# Patient Record
Sex: Female | Born: 1935 | Race: White | Hispanic: No | Smoking: Former smoker
Health system: Southern US, Community
[De-identification: ages and names within clinical notes are randomized; demographics above are authoritative.]

## PROBLEM LIST (undated history)

## (undated) DIAGNOSIS — S52134A Nondisplaced fracture of neck of right radius, initial encounter for closed fracture: Secondary | ICD-10-CM

## (undated) DIAGNOSIS — I10 Essential (primary) hypertension: Secondary | ICD-10-CM

## (undated) DIAGNOSIS — K219 Gastro-esophageal reflux disease without esophagitis: Secondary | ICD-10-CM

## (undated) DIAGNOSIS — G8929 Other chronic pain: Secondary | ICD-10-CM

## (undated) DIAGNOSIS — M19042 Primary osteoarthritis, left hand: Principal | ICD-10-CM

## (undated) DIAGNOSIS — Z79899 Other long term (current) drug therapy: Secondary | ICD-10-CM

## (undated) DIAGNOSIS — M1990 Unspecified osteoarthritis, unspecified site: Secondary | ICD-10-CM

## (undated) DIAGNOSIS — M19041 Primary osteoarthritis, right hand: Secondary | ICD-10-CM

## (undated) DIAGNOSIS — H3530 Unspecified macular degeneration: Secondary | ICD-10-CM

## (undated) DIAGNOSIS — Z87891 Personal history of nicotine dependence: Secondary | ICD-10-CM

## (undated) DIAGNOSIS — M810 Age-related osteoporosis without current pathological fracture: Secondary | ICD-10-CM

## (undated) DIAGNOSIS — E785 Hyperlipidemia, unspecified: Secondary | ICD-10-CM

## (undated) DIAGNOSIS — S82142A Displaced bicondylar fracture of left tibia, initial encounter for closed fracture: Secondary | ICD-10-CM

## (undated) DIAGNOSIS — F419 Anxiety disorder, unspecified: Secondary | ICD-10-CM

## (undated) DIAGNOSIS — M5134 Other intervertebral disc degeneration, thoracic region: Secondary | ICD-10-CM

## (undated) HISTORY — DX: Anxiety disorder, unspecified: F41.9

## (undated) HISTORY — PX: OTHER SURGICAL HISTORY: SHX169

## (undated) HISTORY — DX: Unspecified macular degeneration: H35.30

## (undated) HISTORY — DX: Other long term (current) drug therapy: Z79.899

## (undated) HISTORY — DX: Age-related osteoporosis without current pathological fracture: M81.0

## (undated) HISTORY — DX: Other chronic pain: G89.29

## (undated) HISTORY — DX: Hyperlipidemia, unspecified: E78.5

## (undated) HISTORY — DX: Primary osteoarthritis, right hand: M19.041

## (undated) HISTORY — DX: Personal history of nicotine dependence: Z87.891

## (undated) HISTORY — PX: ABDOMINAL HYSTERECTOMY: SHX81

## (undated) HISTORY — DX: Primary osteoarthritis, left hand: M19.042

## (undated) HISTORY — DX: Essential (primary) hypertension: I10

## (undated) HISTORY — DX: Unspecified osteoarthritis, unspecified site: M19.90

## (undated) HISTORY — DX: Gastro-esophageal reflux disease without esophagitis: K21.9

## (undated) HISTORY — PX: CATARACT EXTRACTION: SUR2

## (undated) HISTORY — DX: Other intervertebral disc degeneration, thoracic region: M51.34

---
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
## 2007-03-20 ENCOUNTER — Ambulatory Visit: Payer: Self-pay

## 2007-06-27 ENCOUNTER — Ambulatory Visit: Payer: Self-pay | Admitting: Family Medicine

## 2008-05-11 ENCOUNTER — Ambulatory Visit: Payer: Self-pay | Admitting: Family Medicine

## 2008-06-17 ENCOUNTER — Ambulatory Visit: Payer: Self-pay | Admitting: Family Medicine

## 2008-07-06 ENCOUNTER — Ambulatory Visit: Payer: Self-pay | Admitting: Family Medicine

## 2008-07-14 ENCOUNTER — Other Ambulatory Visit: Payer: Self-pay

## 2008-07-14 ENCOUNTER — Ambulatory Visit: Payer: Self-pay | Admitting: Ophthalmology

## 2008-07-28 ENCOUNTER — Ambulatory Visit: Payer: Self-pay | Admitting: Ophthalmology

## 2010-01-25 ENCOUNTER — Ambulatory Visit: Payer: Self-pay | Admitting: Ophthalmology

## 2010-02-08 ENCOUNTER — Ambulatory Visit: Payer: Self-pay | Admitting: Ophthalmology

## 2014-04-21 ENCOUNTER — Ambulatory Visit: Payer: Self-pay | Admitting: Family Medicine

## 2014-11-25 DIAGNOSIS — G894 Chronic pain syndrome: Secondary | ICD-10-CM | POA: Diagnosis not present

## 2014-11-25 DIAGNOSIS — M549 Dorsalgia, unspecified: Secondary | ICD-10-CM | POA: Diagnosis not present

## 2014-11-25 DIAGNOSIS — Z5181 Encounter for therapeutic drug level monitoring: Secondary | ICD-10-CM | POA: Diagnosis not present

## 2014-11-25 DIAGNOSIS — I10 Essential (primary) hypertension: Secondary | ICD-10-CM | POA: Diagnosis not present

## 2014-11-25 DIAGNOSIS — G8929 Other chronic pain: Secondary | ICD-10-CM | POA: Diagnosis not present

## 2014-11-25 DIAGNOSIS — J449 Chronic obstructive pulmonary disease, unspecified: Secondary | ICD-10-CM | POA: Diagnosis not present

## 2015-01-05 DIAGNOSIS — J449 Chronic obstructive pulmonary disease, unspecified: Secondary | ICD-10-CM | POA: Diagnosis not present

## 2015-01-05 DIAGNOSIS — I10 Essential (primary) hypertension: Secondary | ICD-10-CM | POA: Diagnosis not present

## 2015-01-05 DIAGNOSIS — F411 Generalized anxiety disorder: Secondary | ICD-10-CM | POA: Diagnosis not present

## 2015-01-05 DIAGNOSIS — G8929 Other chronic pain: Secondary | ICD-10-CM | POA: Diagnosis not present

## 2015-02-17 DIAGNOSIS — H3531 Nonexudative age-related macular degeneration: Secondary | ICD-10-CM | POA: Diagnosis not present

## 2015-03-24 DIAGNOSIS — M791 Myalgia: Secondary | ICD-10-CM | POA: Diagnosis not present

## 2015-03-24 DIAGNOSIS — G8929 Other chronic pain: Secondary | ICD-10-CM | POA: Diagnosis not present

## 2015-03-24 DIAGNOSIS — M1990 Unspecified osteoarthritis, unspecified site: Secondary | ICD-10-CM | POA: Diagnosis not present

## 2015-03-24 DIAGNOSIS — Z79899 Other long term (current) drug therapy: Secondary | ICD-10-CM | POA: Diagnosis not present

## 2015-04-26 ENCOUNTER — Other Ambulatory Visit: Payer: Self-pay | Admitting: Family Medicine

## 2015-05-24 ENCOUNTER — Telehealth: Payer: Self-pay | Admitting: Family Medicine

## 2015-05-24 NOTE — Telephone Encounter (Signed)
Pt was scheduled today for an appt for her chronic pain meds, we had to reschedule. She will be out on Wed 05/26/15 and she has another appt on 06/01/15 for her appt.

## 2015-05-25 ENCOUNTER — Encounter: Payer: Self-pay | Admitting: Family Medicine

## 2015-05-25 ENCOUNTER — Ambulatory Visit (INDEPENDENT_AMBULATORY_CARE_PROVIDER_SITE_OTHER): Payer: Medicare Other | Admitting: Family Medicine

## 2015-05-25 ENCOUNTER — Telehealth: Payer: Self-pay | Admitting: Emergency Medicine

## 2015-05-25 VITALS — BP 118/68 | HR 87 | Temp 97.6°F | Resp 16 | Ht 63.0 in | Wt 116.3 lb

## 2015-05-25 DIAGNOSIS — E46 Unspecified protein-calorie malnutrition: Secondary | ICD-10-CM

## 2015-05-25 DIAGNOSIS — L570 Actinic keratosis: Secondary | ICD-10-CM | POA: Diagnosis not present

## 2015-05-25 DIAGNOSIS — G8929 Other chronic pain: Secondary | ICD-10-CM | POA: Insufficient documentation

## 2015-05-25 DIAGNOSIS — F419 Anxiety disorder, unspecified: Secondary | ICD-10-CM | POA: Diagnosis not present

## 2015-05-25 MED ORDER — HYDROCODONE-ACETAMINOPHEN 10-325 MG PO TABS: 1.0000 | ORAL_TABLET | Freq: Four times a day (QID) | ORAL | Status: DC | PRN

## 2015-05-25 MED ORDER — ALPRAZOLAM 0.5 MG PO TABS: 0.5000 mg | ORAL_TABLET | Freq: Every evening | ORAL | Status: DC | PRN

## 2015-05-25 NOTE — Progress Notes (Signed)
Name: Samantha Dawson   MRN: [id number]    DOB: Feb 23, 1929   Date:05/25/2015       Progress Note  Subjective  Chief Complaint  Chief Complaint  Patient presents with  . Pain  . Anxiety  . Osteoarthritis    Anxiety Presents for follow-up visit. The problem has been waxing and waning. Symptoms include excessive worry, irritability, nervous/anxious behavior, palpitations and restlessness. Patient reports no chest pain, dizziness, insomnia, nausea or shortness of breath. Symptoms occur most days. The severity of symptoms is moderate. The symptoms are aggravated by specific phobias, social activities and family issues. The quality of sleep is fair.   Past treatments include benzodiazephines. The treatment provided moderate relief. Compliance with prior treatments has been good.    Chronic pain.  Patient has a long-standing history of chronic osteoporotic pain particularly in the hands and knees. Without her current pain medication she is unable to do her ADLs and to rest comfortably at night.   Skin lesions  Patient complains of irregular lesions on her lower extremities also upper extremities. She has a long-standing history of sun exposure without sunscreen detection. She has not been seen by dermatologists of recent. Past Medical History  Diagnosis Date  . Osteoarthritis   . Anxiety   . Encounter for chronic pain management     History  Substance Use Topics  . Smoking status: Former Research scientist (life sciences)  . Smokeless tobacco: Not on file  . Alcohol Use: No     Current outpatient prescriptions:  .  albuterol (PROVENTIL HFA;VENTOLIN HFA) 108 (90 BASE) MCG/ACT inhaler, Inhale into the lungs every 6 (six) hours as needed for wheezing or shortness of breath., Disp: , Rfl:  .  ALPRAZolam (XANAX) 0.5 MG tablet, Take 0.5 mg by mouth at bedtime as needed for anxiety., Disp: , Rfl:  .  aspirin 81 MG tablet, Take 81 mg by mouth daily., Disp: , Rfl:  .  clopidogrel (PLAVIX) 75 MG tablet, Take 75 mg  by mouth daily., Disp: , Rfl:  .  co-enzyme Q-10 30 MG capsule, Take 30 mg by mouth 3 (three) times daily., Disp: , Rfl:  .  HYDROcodone-acetaminophen (NORCO) 10-325 MG per tablet, Take 1 tablet by mouth every 6 (six) hours as needed., Disp: , Rfl:  .  metoprolol succinate (TOPROL-XL) 50 MG 24 hr tablet, Take 50 mg by mouth daily. Take with or immediately following a meal., Disp: , Rfl:  .  pantoprazole (PROTONIX) 40 MG tablet, Take 40 mg by mouth daily., Disp: , Rfl:  .  rosuvastatin (CRESTOR) 5 MG tablet, Take 5 mg by mouth daily., Disp: , Rfl:  .  tiotropium (SPIRIVA) 18 MCG inhalation capsule, Place 18 mcg into inhaler and inhale daily., Disp: , Rfl:  .  valsartan (DIOVAN) 80 MG tablet, TAKE ONE (1) TABLET EACH DAY, Disp: 30 tablet, Rfl: 7  No Known Allergies  Review of Systems  Constitutional: Positive for irritability. Negative for fever, chills and weight loss.  HENT: Negative for congestion, hearing loss, sore throat and tinnitus.   Eyes: Negative for blurred vision, double vision and redness.  Respiratory: Negative for cough, hemoptysis and shortness of breath.   Cardiovascular: Positive for palpitations. Negative for chest pain, orthopnea, claudication and leg swelling.  Gastrointestinal: Negative for heartburn, nausea, vomiting, diarrhea, constipation and blood in stool.  Genitourinary: Negative for dysuria, urgency, frequency and hematuria.  Musculoskeletal: Positive for joint pain. Negative for myalgias, back pain, falls and neck pain.  Skin: Positive for rash. Negative for  itching.       Solar damage lesions of the extremities  Neurological: Negative for dizziness, tingling, tremors, focal weakness, seizures, loss of consciousness, weakness and headaches.  Endo/Heme/Allergies: Does not bruise/bleed easily.  Psychiatric/Behavioral: Negative for depression and substance abuse. The patient is nervous/anxious. The patient does not have insomnia.      Objective  Filed Vitals:    05/25/15 1520  BP: 118/68  Pulse: 87  Temp: 97.6 F (36.4 C)  Resp: 16  Height: 5\' 3"  (1.6 m)  Weight: 116 lb 5 oz (52.759 kg)  SpO2: 93%     Physical Exam  Constitutional: She is oriented to person, place, and time and well-developed, well-nourished, and in no distress.  HENT:  Head: Normocephalic.  Eyes: EOM are normal. Pupils are equal, round, and reactive to light.  Neck: Normal range of motion. No thyromegaly present.  Cardiovascular: Normal rate, regular rhythm and normal heart sounds.   No murmur heard. Pulmonary/Chest: Effort normal and breath sounds normal.  Abdominal: Soft. Bowel sounds are normal.  Musculoskeletal: Normal range of motion. She exhibits tenderness. She exhibits no edema.  Neurological: She is alert and oriented to person, place, and time. No cranial nerve deficit. Gait normal.  Skin: Skin is warm and dry. Rash noted.  Multiple changes of solar keratosis. There is also a horny area of the epithelium in the right lower extremity which is very firm and tender  Psychiatric: Memory and affect normal.  Anxious      Assessment & Plan  1. Chronic pain Stable - HYDROcodone-acetaminophen (NORCO) 10-325 MG per tablet; Take 1 tablet by mouth every 6 (six) hours as needed.  Dispense: 120 tablet; Refill: 0  2. Acute anxiety Stable - ALPRAZolam (XANAX) 0.5 MG tablet; Take 1 tablet (0.5 mg total) by mouth at bedtime as needed for anxiety.  Dispense: 30 tablet; Refill: 3  3. Actinic keratosis Needs referral - Ambulatory referral to Dermatology  4. Protein calorie malnutrition Nutritional supplementation as needed and has not is given

## 2015-05-25 NOTE — Telephone Encounter (Signed)
Spoke to patient. Made appointment for today to be seen for pain med refills

## 2015-05-25 NOTE — Patient Instructions (Signed)
Protein Supplement Powders Protein, along with carbohydrates, fats, fibers, and fluids, is one of the major components of the human diet. Protein is the source of amino acids, which the body uses as building blocks for much of the human body. There are two types of amino acids: essential and non-essential. The essential amino acids cannot be produced by the body and must be take in through food and drink. Most people associate proteins with muscles, but they are also important for enzymes and hormones that play regulatory roles in the body. As the body breaks down proteins, they need to be replaced.  WHY ATHLETES USE IT   To improve muscle growth and repair of exercising muscle.  To make red blood cells, enzymes, and antibodies.  To maintain hair, skin, and fingernail health.  To maintain regular menstrual cycles in athletic females. ADVERSE EFFECTS   Gastrointestinal discomfort.  Constipation.  Tissue dehydration.  Increased stress on kidneys to remove the proteins. PHARMACOLOGY Adults need on average 0.8 g/kg of lean body mass (approximately 75 to 90% of total body weight) per day to maintain proper metabolic functions. People who place greater stress on their muscles, such as athletes, require greater amounts of protein in their diets (1.4 to 1.8 g/kg of lean body mass per day). There is limited evidence to show that protein or amino acid supplementation increases performance ability. No study shows improvement with protein consumption of 2 g/ kg of lean body mass per day. PREVENTION  Due to the lack of regulation, many of the products labeled for sale may contain impurities or dosages that differ from the labeling. If one chooses to use take protein, it is important to drink a lot of fluid. Taking protein may create an osmotic force that absorbs water. Dosages that are greater than recommended will put individuals at a higher risk of kidney damage. Therefore, calculate the you daily  consumption of protein with both protein in your diet and in your supplements. Protein rich foods include lean meat, milk, eggs, yogurt and cheeses, as well as beans, rice, tofu, legumes, and grains. Before beginning a protein supplement regimen speak with a sports nutritionist. Document Released: 10/23/2005 Document Revised: 01/15/2012 Document Reviewed: 02/04/2009 Exit[employer] Patient Information 2015 [place], [place]. This information is not intended to replace advice given to you by your health care provider. Make sure you discuss any questions you have with your health care provider.

## 2015-05-26 DIAGNOSIS — E46 Unspecified protein-calorie malnutrition: Secondary | ICD-10-CM | POA: Insufficient documentation

## 2015-06-01 ENCOUNTER — Ambulatory Visit: Payer: Self-pay | Admitting: Family Medicine

## 2015-07-22 ENCOUNTER — Encounter: Payer: Self-pay | Admitting: Family Medicine

## 2015-07-22 ENCOUNTER — Ambulatory Visit (INDEPENDENT_AMBULATORY_CARE_PROVIDER_SITE_OTHER): Payer: Medicare Other | Admitting: Family Medicine

## 2015-07-22 VITALS — BP 118/80 | HR 83 | Temp 97.9°F | Resp 16 | Ht 63.0 in | Wt 116.3 lb

## 2015-07-22 DIAGNOSIS — H5789 Other specified disorders of eye and adnexa: Secondary | ICD-10-CM

## 2015-07-22 DIAGNOSIS — H5712 Ocular pain, left eye: Secondary | ICD-10-CM

## 2015-07-22 DIAGNOSIS — G8929 Other chronic pain: Secondary | ICD-10-CM | POA: Diagnosis not present

## 2015-07-22 DIAGNOSIS — H5319 Other subjective visual disturbances: Secondary | ICD-10-CM

## 2015-07-22 DIAGNOSIS — H53142 Visual discomfort, left eye: Secondary | ICD-10-CM

## 2015-07-22 DIAGNOSIS — H578 Other specified disorders of eye and adnexa: Secondary | ICD-10-CM

## 2015-07-22 DIAGNOSIS — Z23 Encounter for immunization: Secondary | ICD-10-CM

## 2015-07-22 DIAGNOSIS — H3530 Unspecified macular degeneration: Secondary | ICD-10-CM | POA: Diagnosis not present

## 2015-07-22 DIAGNOSIS — H1132 Conjunctival hemorrhage, left eye: Secondary | ICD-10-CM | POA: Diagnosis not present

## 2015-07-22 MED ORDER — HYDROCODONE-ACETAMINOPHEN 10-325 MG PO TABS: 1.0000 | ORAL_TABLET | Freq: Four times a day (QID) | ORAL | Status: DC | PRN

## 2015-07-22 NOTE — Progress Notes (Signed)
Name: Samantha Dawson   MRN: [id number]    DOB: [birth year]   Date:07/22/2015       Progress Note  Subjective  Chief Complaint  Chief Complaint  Patient presents with  . Arthritis    2 month recheck   . Pain  . Eye Problem    redness in right eye for 4 days    HPI  Chronic pain  Patient presents for follow-up of chronic pain syndrome. The pain score at worse is 10 3 10   and at best is 3/10 with medication rest and home remedies.  There is no evidence of any extra dosage or issues of usage outside of the prescribed regimen.  Pain is exacerbated by changes in weather and by strenuous activities. There have been no recent activities to exacerbate the chronic pain. Concomitant medications include hydrocodone 1 every 6 hours .  Drug screen and medication contract have been renewed within the last 1 year.    Eye Pain and redness  Patient presents with a four-day history of left eye pain. There is also some photophobia. She has a history of macular degeneration as well. There's been some change in her vision. There is no history of any trauma to the eye. She does have a history of macular  degeneration at the same  Past Medical History  Diagnosis Date  . Osteoarthritis   . Anxiety   . Encounter for chronic pain management   . Hyperlipidemia   . Hypertension     Social History  Substance Use Topics  . Smoking status: Former Research scientist (life sciences)  . Smokeless tobacco: Not on file  . Alcohol Use: No     Current outpatient prescriptions:  .  albuterol (PROVENTIL HFA;VENTOLIN HFA) 108 (90 BASE) MCG/ACT inhaler, Inhale into the lungs every 6 (six) hours as needed for wheezing or shortness of breath., Disp: , Rfl:  .  ALPRAZolam (XANAX) 0.5 MG tablet, Take 1 tablet (0.5 mg total) by mouth at bedtime as needed for anxiety., Disp: 30 tablet, Rfl: 3 .  aspirin 81 MG tablet, Take 81 mg by mouth daily., Disp: , Rfl:  .  clopidogrel (PLAVIX) 75 MG tablet, Take 75 mg by mouth daily., Disp: , Rfl:  .   co-enzyme Q-10 30 MG capsule, Take 30 mg by mouth 3 (three) times daily., Disp: , Rfl:  .  HYDROcodone-acetaminophen (NORCO) 10-325 MG per tablet, Take 1 tablet by mouth every 6 (six) hours as needed., Disp: 120 tablet, Rfl: 0 .  metoprolol succinate (TOPROL-XL) 50 MG 24 hr tablet, Take 50 mg by mouth daily. Take with or immediately following a meal., Disp: , Rfl:  .  pantoprazole (PROTONIX) 40 MG tablet, Take 40 mg by mouth daily., Disp: , Rfl:  .  ranitidine (ZANTAC) 150 MG tablet, , Disp: , Rfl:  .  rosuvastatin (CRESTOR) 5 MG tablet, Take 5 mg by mouth daily., Disp: , Rfl:  .  tiotropium (SPIRIVA) 18 MCG inhalation capsule, Place 18 mcg into inhaler and inhale daily., Disp: , Rfl:  .  valsartan (DIOVAN) 80 MG tablet, TAKE ONE (1) TABLET EACH DAY, Disp: 30 tablet, Rfl: 7  No Known Allergies  Review of Systems  Constitutional: Negative.  Negative for fever, chills, weight loss and malaise/fatigue.  HENT: Negative for ear pain, hearing loss and tinnitus.   Eyes: Positive for blurred vision, photophobia, pain, discharge and redness. Negative for double vision.  Respiratory: Negative.   Cardiovascular: Negative.   Musculoskeletal: Positive for back pain, joint pain and  neck pain. Negative for falls.  Skin: Negative.   Neurological: Negative for headaches.     Objective  Filed Vitals:   07/22/15 1048  BP: 118/80  Pulse: 83  Temp: 97.9 F (36.6 C)  TempSrc: Oral  Resp: 16  Height: 5\' 3"  (1.6 m)  Weight: 116 lb 4.8 oz (52.753 kg)  SpO2: 94%     Physical Exam  Constitutional: She is oriented to person, place, and time and well-developed, well-nourished, and in no distress.  HENT:  Head: Normocephalic.  Eyes: EOM and lids are normal. Pupils are equal, round, and reactive to light. Right conjunctiva is injected. Right conjunctiva has a hemorrhage.  Fundoscopic exam:      The right eye shows no arteriolar narrowing, no AV nicking, no exudate, no hemorrhage and no papilledema. The  right eye shows red reflex.       The left eye shows no arteriolar narrowing, no AV nicking, no exudate, no hemorrhage and no papilledema. The left eye shows red reflex.  Neck: Normal range of motion. No thyromegaly present.  Cardiovascular: Normal rate, regular rhythm and normal heart sounds.   No murmur heard. Pulmonary/Chest: Effort normal and breath sounds normal.  Abdominal: Soft. Bowel sounds are normal.  Musculoskeletal: She exhibits tenderness (diffuse joint tenderness particularly of the MCP DIP PIP joints and the knees. There is no synovitis or warmth or erythema. Hypertrophic changes noted). She exhibits no edema.  Neurological: She is alert and oriented to person, place, and time. No cranial nerve deficit. Gait normal.  Skin: Skin is warm and dry. No rash noted.  Psychiatric: Memory and affect normal.      Assessment & Plan   1. Chronic pain Basically stable except for exacerbations during weather changes  - HYDROcodone-acetaminophen (NORCO) 10-325 MG per tablet; Take 1 tablet by mouth every 6 (six) hours as needed.  Dispense: 120 tablet; Refill: 0 - HYDROcodone-acetaminophen (NORCO) 10-325 MG per tablet; Take 1 tablet by mouth every 6 (six) hours as needed.  Dispense: 120 tablet; Refill: 0 - HYDROcodone-acetaminophen (NORCO) 10-325 MG per tablet; Take 1 tablet by mouth every 6 (six) hours as needed.  Dispense: 120 tablet; Refill: 0 - HYDROcodone-acetaminophen (NORCO) 10-325 MG per tablet; Take 1 tablet by mouth every 6 (six) hours as needed.  Dispense: 120 tablet; Refill: 0  2. Eye redness  referred to ophthalmologist ASAP  3. Eye pain, left Referred to ophthalmologist ASAP  4. Photophobia of left eye \\Referred  to ophthalmologist ASAP  5. Macular degeneration Follow-up regular doctor

## 2015-08-03 NOTE — Patient Instructions (Signed)
Referral to ophthalmologist acutely

## 2015-08-11 DIAGNOSIS — C44729 Squamous cell carcinoma of skin of left lower limb, including hip: Secondary | ICD-10-CM | POA: Diagnosis not present

## 2015-08-11 DIAGNOSIS — L570 Actinic keratosis: Secondary | ICD-10-CM | POA: Diagnosis not present

## 2015-08-11 DIAGNOSIS — D485 Neoplasm of uncertain behavior of skin: Secondary | ICD-10-CM | POA: Diagnosis not present

## 2015-08-11 DIAGNOSIS — L578 Other skin changes due to chronic exposure to nonionizing radiation: Secondary | ICD-10-CM | POA: Diagnosis not present

## 2015-08-17 ENCOUNTER — Other Ambulatory Visit: Payer: Self-pay | Admitting: Family Medicine

## 2015-08-24 ENCOUNTER — Ambulatory Visit: Payer: Self-pay | Admitting: Family Medicine

## 2015-08-26 ENCOUNTER — Other Ambulatory Visit: Payer: Self-pay | Admitting: Family Medicine

## 2015-09-15 DIAGNOSIS — C44729 Squamous cell carcinoma of skin of left lower limb, including hip: Secondary | ICD-10-CM | POA: Diagnosis not present

## 2015-10-15 ENCOUNTER — Other Ambulatory Visit: Payer: Self-pay | Admitting: Family Medicine

## 2015-10-20 DIAGNOSIS — C44729 Squamous cell carcinoma of skin of left lower limb, including hip: Secondary | ICD-10-CM | POA: Diagnosis not present

## 2015-10-25 ENCOUNTER — Ambulatory Visit (INDEPENDENT_AMBULATORY_CARE_PROVIDER_SITE_OTHER): Payer: Medicare Other | Admitting: Family Medicine

## 2015-10-25 ENCOUNTER — Encounter: Payer: Self-pay | Admitting: Family Medicine

## 2015-10-25 VITALS — BP 122/70 | HR 82 | Temp 98.9°F | Resp 18 | Ht 63.0 in | Wt 115.2 lb

## 2015-10-25 DIAGNOSIS — E785 Hyperlipidemia, unspecified: Secondary | ICD-10-CM | POA: Insufficient documentation

## 2015-10-25 DIAGNOSIS — G8929 Other chronic pain: Secondary | ICD-10-CM | POA: Diagnosis not present

## 2015-10-25 DIAGNOSIS — I10 Essential (primary) hypertension: Secondary | ICD-10-CM | POA: Diagnosis not present

## 2015-10-25 DIAGNOSIS — F419 Anxiety disorder, unspecified: Secondary | ICD-10-CM

## 2015-10-25 DIAGNOSIS — J431 Panlobular emphysema: Secondary | ICD-10-CM | POA: Diagnosis not present

## 2015-10-25 MED ORDER — ALPRAZOLAM 0.5 MG PO TABS: 0.5000 mg | ORAL_TABLET | Freq: Every evening | ORAL | Status: DC | PRN

## 2015-10-25 MED ORDER — HYDROCODONE-ACETAMINOPHEN 10-325 MG PO TABS: 1.0000 | ORAL_TABLET | Freq: Four times a day (QID) | ORAL | Status: DC | PRN

## 2015-10-25 MED ORDER — ALBUTEROL SULFATE HFA 108 (90 BASE) MCG/ACT IN AERS: 2.0000 | INHALATION_SPRAY | Freq: Four times a day (QID) | RESPIRATORY_TRACT | Status: DC | PRN

## 2015-10-25 NOTE — Progress Notes (Signed)
Name: Samantha Dawson   MRN: [id number]    DOB: 01-25-1929   Date:10/25/2015       Progress Note  Subjective  Chief Complaint  Chief Complaint  Patient presents with  . Arthritis    Chronic Pain Med refill 3 month recheck  . Anxiety    3 month recheck    HPI   Chronic pain  Patient presents for follow-up of chronic pain syndrome. The pain score at worse is 10/10  and at best is 4/10 with medication rest and home remedies.  There is no evidence of any extra dosage or issues of usage outside of the prescribed regimen.  Pain is exacerbated by changes in weather and by strenuous activities. There have been no recent activities to exacerbate the chronic pain. Concomitant medications include Vicodin 10/325 3 times a day .  Drug screen and medication contract have been renewed within the last 1 year.    Anxiety history of present illness  Patient has a long-standing history of anxiety for over 5 years. She is currently on a regimen consisting of Xanax 0.5 mg taken mostly pain bedtime.  Hypertension   Patient presents for follow-up of hypertension. It has been present for over 5years.  Patient states that there is compliance with medical regimen which consists of metoprolol 50 mg daily and valsartan 80 mg dailyctors include hypertension hyperlipidemia and diabetes.  Exercise regimen consist of minimal walkingt of some salt restriction  Hyperlipidemia  Patient has a history of hyperlipidemia for over 5.  Current medical regimen consist of Crestor 5 mg daily at bedtime and exercise are currently followed fairly wellctors for cardiovascular disease include hyperlipidemia hypertension and age as well as smokingom the medication.    Past Medical History  Diagnosis Date  . Osteoarthritis   . Anxiety   . Encounter for chronic pain management   . Hyperlipidemia   . Hypertension     Social History  Substance Use Topics  . Smoking status: Former Research scientist (life sciences)  . Smokeless tobacco: Not on file  .  Alcohol Use: No     Current outpatient prescriptions:  .  albuterol (PROVENTIL HFA;VENTOLIN HFA) 108 (90 BASE) MCG/ACT inhaler, Inhale 2 puffs into the lungs every 6 (six) hours as needed for wheezing or shortness of breath., Disp: 1 Inhaler, Rfl: 5 .  ALPRAZolam (XANAX) 0.5 MG tablet, Take 1 tablet (0.5 mg total) by mouth at bedtime as needed for anxiety., Disp: 30 tablet, Rfl: 3 .  aspirin 81 MG tablet, Take 81 mg by mouth daily., Disp: , Rfl:  .  clopidogrel (PLAVIX) 75 MG tablet, Take 75 mg by mouth daily., Disp: , Rfl:  .  co-enzyme Q-10 30 MG capsule, Take 30 mg by mouth 3 (three) times daily., Disp: , Rfl:  .  HYDROcodone-acetaminophen (NORCO) 10-325 MG per tablet, Take 1 tablet by mouth every 6 (six) hours as needed., Disp: 120 tablet, Rfl: 0 .  HYDROcodone-acetaminophen (NORCO) 10-325 MG per tablet, Take 1 tablet by mouth every 6 (six) hours as needed., Disp: 120 tablet, Rfl: 0 .  HYDROcodone-acetaminophen (NORCO) 10-325 MG per tablet, Take 1 tablet by mouth every 6 (six) hours as needed., Disp: 120 tablet, Rfl: 0 .  HYDROcodone-acetaminophen (NORCO) 10-325 MG per tablet, Take 1 tablet by mouth every 6 (six) hours as needed., Disp: 120 tablet, Rfl: 0 .  metoprolol succinate (TOPROL-XL) 50 MG 24 hr tablet, TAKE ONE (1) TABLET EACH DAY, Disp: 30 tablet, Rfl: 7 .  pantoprazole (PROTONIX) 40 MG tablet,  TAKE ONE TABLET AT BEDTIME, Disp: 90 tablet, Rfl: 0 .  ranitidine (ZANTAC) 150 MG tablet, TAKE ONE (1) TABLET EACH DAY, Disp: 90 tablet, Rfl: 1 .  rosuvastatin (CRESTOR) 5 MG tablet, TAKE ONE (1) TABLET EACH DAY, Disp: 30 tablet, Rfl: 5 .  tiotropium (SPIRIVA) 18 MCG inhalation capsule, Place 18 mcg into inhaler and inhale daily., Disp: , Rfl:  .  valsartan (DIOVAN) 80 MG tablet, TAKE ONE (1) TABLET EACH DAY, Disp: 30 tablet, Rfl: 7  No Known Allergies  Review of Systems  Constitutional: Negative for fever, chills and weight loss.  HENT: Negative for congestion, hearing loss, sore throat  and tinnitus.   Eyes: Negative for blurred vision, double vision and redness.  Respiratory: Negative for cough, hemoptysis and shortness of breath.   Cardiovascular: Negative for chest pain, palpitations, orthopnea, claudication and leg swelling.  Gastrointestinal: Negative for heartburn, nausea, vomiting, diarrhea, constipation and blood in stool.  Genitourinary: Negative for dysuria, urgency, frequency and hematuria.  Musculoskeletal: Positive for back pain and joint pain. Negative for myalgias, falls and neck pain.  Skin: Negative for itching.  Neurological: Negative for dizziness, tingling, tremors, focal weakness, seizures, loss of consciousness, weakness and headaches.  Endo/Heme/Allergies: Bruises/bleeds easily.  Psychiatric/Behavioral: Negative for depression and substance abuse. The patient is nervous/anxious. The patient does not have insomnia.      Objective  Filed Vitals:   10/25/15 1028  BP: 122/70  Pulse: 82  Temp: 98.9 F (37.2 C)  TempSrc: Oral  Resp: 18  Height: 5\' 3"  (1.6 m)  Weight: 115 lb 3.2 oz (52.254 kg)  SpO2: 94%     Physical Exam  Constitutional: She is oriented to person, place, and time and well-developed, well-nourished, and in no distress.  HENT:  Head: Normocephalic.  Eyes: EOM are normal. Pupils are equal, round, and reactive to light.  Neck: Normal range of motion. No thyromegaly present.  Cardiovascular: Normal rate, regular rhythm and normal heart sounds.   No murmur heard. Pulmonary/Chest: Effort normal and breath sounds normal.  Abdominal: Soft. Bowel sounds are normal.  Musculoskeletal: She exhibits tenderness (significant osteophytic changes particularly of the hands with Heberden's and Bouchard's nodes). She exhibits no edema.  Neurological: She is alert and oriented to person, place, and time. No cranial nerve deficit. Gait normal.  Skin: Skin is warm and dry. No rash noted.  Psychiatric: Memory normal.  [place] a bit loquacious       Assessment & Plan   1. Chronic pain Stable - HYDROcodone-acetaminophen (NORCO) 10-325 MG tablet; Take 1 tablet by mouth every 6 (six) hours as needed.  Dispense: 120 tablet; Refill: 0 - HYDROcodone-acetaminophen (NORCO) 10-325 MG tablet; Take 1 tablet by mouth every 6 (six) hours as needed.  Dispense: 120 tablet; Refill: 0 - HYDROcodone-acetaminophen (NORCO) 10-325 MG tablet; Take 1 tablet by mouth every 6 (six) hours as needed.  Dispense: 120 tablet; Refill: 0 - HYDROcodone-acetaminophen (NORCO) 10-325 MG tablet; Take 1 tablet by mouth every 6 (six) hours as needed.  Dispense: 120 tablet; Refill: 0  2. Acute anxiety Stable - ALPRAZolam (XANAX) 0.5 MG tablet; Take 1 tablet (0.5 mg total) by mouth at bedtime as needed for anxiety.  Dispense: 30 tablet; Refill: 2 - Comprehensive Metabolic Panel (CMET) - Lipid panel - TSH  3. Hyperlipidemia Labs - Lipid panel  4. Essential hypertension Well-controlled - Comprehensive Metabolic Panel (CMET) - Lipid panel - TSH  5. Panlobular emphysema ([place]) Continue Spiriva and pro-air when necessary and again discontinue patient have evened  E smoking

## 2015-11-16 ENCOUNTER — Other Ambulatory Visit: Payer: Self-pay | Admitting: Family Medicine

## 2015-11-17 ENCOUNTER — Telehealth: Payer: Self-pay | Admitting: Family Medicine

## 2015-11-17 NOTE — Telephone Encounter (Signed)
Please return call pertaining to proair. States that it isnt correct. Usually receives 3 canister of the proair but this time she only received one. She would like for you to return her call so she can explain what she need

## 2015-11-18 ENCOUNTER — Other Ambulatory Visit: Payer: Self-pay

## 2015-11-18 MED ORDER — ALBUTEROL SULFATE HFA 108 (90 BASE) MCG/ACT IN AERS: 2.0000 | INHALATION_SPRAY | Freq: Four times a day (QID) | RESPIRATORY_TRACT | Status: DC | PRN

## 2015-12-02 DIAGNOSIS — I10 Essential (primary) hypertension: Secondary | ICD-10-CM | POA: Diagnosis not present

## 2015-12-02 DIAGNOSIS — E785 Hyperlipidemia, unspecified: Secondary | ICD-10-CM | POA: Diagnosis not present

## 2015-12-02 DIAGNOSIS — F419 Anxiety disorder, unspecified: Secondary | ICD-10-CM | POA: Diagnosis not present

## 2015-12-03 LAB — COMPREHENSIVE METABOLIC PANEL
ALBUMIN: 4.3 g/dL (ref 3.5–4.7)
ALK PHOS: 155 IU/L — AB (ref 39–117)
ALT: 9 IU/L (ref 0–32)
AST: 19 IU/L (ref 0–40)
Albumin/Globulin Ratio: 1.5 (ref 1.1–2.5)
BILIRUBIN TOTAL: 0.7 mg/dL (ref 0.0–1.2)
BUN / CREAT RATIO: 15 (ref 11–26)
BUN: 13 mg/dL (ref 8–27)
CHLORIDE: 103 mmol/L (ref 96–106)
CO2: 24 mmol/L (ref 18–29)
CREATININE: 0.84 mg/dL (ref 0.57–1.00)
Calcium: 9.4 mg/dL (ref 8.7–10.3)
GFR calc Af Amer: 74 mL/min/{1.73_m2} (ref >59)
GFR calc non Af Amer: 64 mL/min/{1.73_m2} (ref >59)
Globulin, Total: 2.9 g/dL (ref 1.5–4.5)
Glucose: 102 mg/dL — ABNORMAL HIGH (ref 65–99)
Potassium: 5.3 mmol/L — ABNORMAL HIGH (ref 3.5–5.2)
Sodium: 144 mmol/L (ref 134–144)
TOTAL PROTEIN: 7.2 g/dL (ref 6.0–8.5)

## 2015-12-03 LAB — LIPID PANEL
CHOLESTEROL TOTAL: 148 mg/dL (ref 100–199)
Chol/HDL Ratio: 3 ratio units (ref 0.0–4.4)
HDL: 49 mg/dL (ref >39)
LDL CALC: 68 mg/dL (ref 0–99)
Triglycerides: 155 mg/dL — ABNORMAL HIGH (ref 0–149)
VLDL CHOLESTEROL CAL: 31 mg/dL (ref 5–40)

## 2015-12-03 LAB — TSH: TSH: 1.35 u[IU]/mL (ref 0.450–4.500)

## 2015-12-16 ENCOUNTER — Other Ambulatory Visit: Payer: Self-pay | Admitting: Family Medicine

## 2015-12-21 ENCOUNTER — Other Ambulatory Visit: Payer: Self-pay

## 2015-12-21 DIAGNOSIS — G8929 Other chronic pain: Secondary | ICD-10-CM

## 2015-12-21 MED ORDER — HYDROCODONE-ACETAMINOPHEN 10-325 MG PO TABS: 1.0000 | ORAL_TABLET | Freq: Four times a day (QID) | ORAL | Status: DC | PRN

## 2015-12-22 ENCOUNTER — Telehealth: Payer: Self-pay | Admitting: Emergency Medicine

## 2015-12-22 NOTE — Telephone Encounter (Signed)
Patient notified of labs.   

## 2015-12-27 ENCOUNTER — Other Ambulatory Visit: Payer: Self-pay | Admitting: Family Medicine

## 2016-01-19 DIAGNOSIS — L570 Actinic keratosis: Secondary | ICD-10-CM | POA: Diagnosis not present

## 2016-01-19 DIAGNOSIS — C44729 Squamous cell carcinoma of skin of left lower limb, including hip: Secondary | ICD-10-CM | POA: Diagnosis not present

## 2016-01-20 ENCOUNTER — Other Ambulatory Visit: Payer: Self-pay

## 2016-01-20 DIAGNOSIS — G8929 Other chronic pain: Secondary | ICD-10-CM

## 2016-01-20 MED ORDER — HYDROCODONE-ACETAMINOPHEN 10-325 MG PO TABS: 1.0000 | ORAL_TABLET | Freq: Four times a day (QID) | ORAL | Status: DC | PRN

## 2016-01-21 DIAGNOSIS — H353131 Nonexudative age-related macular degeneration, bilateral, early dry stage: Secondary | ICD-10-CM | POA: Diagnosis not present

## 2016-01-24 ENCOUNTER — Ambulatory Visit: Payer: Medicare Other | Admitting: Family Medicine

## 2016-02-14 ENCOUNTER — Other Ambulatory Visit: Payer: Self-pay | Admitting: Family Medicine

## 2016-02-22 ENCOUNTER — Ambulatory Visit: Payer: Medicare Other | Admitting: Family Medicine

## 2016-03-15 ENCOUNTER — Other Ambulatory Visit: Payer: Self-pay | Admitting: Family Medicine

## 2016-03-22 ENCOUNTER — Ambulatory Visit (INDEPENDENT_AMBULATORY_CARE_PROVIDER_SITE_OTHER): Payer: Medicare Other | Admitting: Family Medicine

## 2016-03-22 ENCOUNTER — Encounter: Payer: Self-pay | Admitting: Family Medicine

## 2016-03-22 VITALS — BP 116/68 | HR 89 | Temp 98.2°F | Resp 16 | Ht 63.0 in | Wt 115.5 lb

## 2016-03-22 DIAGNOSIS — M810 Age-related osteoporosis without current pathological fracture: Secondary | ICD-10-CM | POA: Insufficient documentation

## 2016-03-22 DIAGNOSIS — M546 Pain in thoracic spine: Secondary | ICD-10-CM | POA: Diagnosis not present

## 2016-03-22 DIAGNOSIS — F419 Anxiety disorder, unspecified: Secondary | ICD-10-CM

## 2016-03-22 DIAGNOSIS — I10 Essential (primary) hypertension: Secondary | ICD-10-CM

## 2016-03-22 DIAGNOSIS — Z720 Tobacco use: Secondary | ICD-10-CM | POA: Diagnosis not present

## 2016-03-22 DIAGNOSIS — M549 Dorsalgia, unspecified: Secondary | ICD-10-CM | POA: Insufficient documentation

## 2016-03-22 DIAGNOSIS — G8929 Other chronic pain: Secondary | ICD-10-CM

## 2016-03-22 DIAGNOSIS — Z79899 Other long term (current) drug therapy: Secondary | ICD-10-CM | POA: Insufficient documentation

## 2016-03-22 DIAGNOSIS — Z5181 Encounter for therapeutic drug level monitoring: Secondary | ICD-10-CM

## 2016-03-22 HISTORY — DX: Age-related osteoporosis without current pathological fracture: M81.0

## 2016-03-22 HISTORY — DX: Other long term (current) drug therapy: Z79.899

## 2016-03-22 MED ORDER — HYDROCODONE-ACETAMINOPHEN 10-325 MG PO TABS: 1.0000 | ORAL_TABLET | Freq: Four times a day (QID) | ORAL | Status: DC | PRN

## 2016-03-22 NOTE — Assessment & Plan Note (Signed)

## 2016-03-22 NOTE — Patient Instructions (Addendum)
You will be due for your next bone density scan on or after April 22, 2016 Try to practice good fall precautions  Please do call to schedule your bone density study; the number to schedule one at either [facility] or [facility] is [phone]  I do encourage you to quit smoking Call [phone] to sign up for smoking cessation classes You can call 1-800-QUIT-NOW to talk with a smoking cessation coach  Read through the FDA press release from August 2016; it is DANGEROUS and can be FATAL to combine your pain medicine and the anxiety pill Xanax; STOP taking your Xanax  Have the urine and the xray done today  Fall Prevention in the Home  Falls can cause injuries and can affect people from all age groups. There are many simple things that you can do to make your home safe and to help prevent falls. WHAT CAN I DO ON THE OUTSIDE OF MY HOME?  Regularly repair the edges of walkways and driveways and fix any cracks.  Remove high doorway thresholds.  Trim any shrubbery on the main path into your home.  Use bright outdoor lighting.  Clear walkways of debris and clutter, including tools and rocks.  Regularly check that handrails are securely fastened and in good repair. Both sides of any steps should have handrails.  Install guardrails along the edges of any raised decks or porches.  Have leaves, snow, and ice cleared regularly.  Use sand or salt on walkways during winter months.  In the garage, clean up any spills right away, including grease or oil spills. WHAT CAN I DO IN THE BATHROOM?  Use night lights.  Install grab bars by the toilet and in the tub and shower. Do not use towel bars as grab bars.  Use non-skid mats or decals on the floor of the tub or shower.  If you need to sit down while you are in the shower, use a plastic, non-slip stool.[place] Kitchen  Keep the floor dry. Immediately clean up any water that spills on the floor.  Remove soap buildup in  the tub or shower on a regular basis.  Attach bath mats securely with double-sided non-slip rug tape.  Remove throw rugs and other tripping hazards from the floor. WHAT CAN I DO IN THE BEDROOM?  Use night lights.  Make sure that a bedside light is easy to reach.  Do not use oversized bedding that drapes onto the floor.  Have a firm chair that has side arms to use for getting dressed.  Remove throw rugs and other tripping hazards from the floor. WHAT CAN I DO IN THE KITCHEN?   Clean up any spills right away.  Avoid walking on wet floors.  Place frequently used items in easy-to-reach places.  If you need to reach for something above you, use a sturdy step stool that has a grab bar.  Keep electrical cables out of the way.  Do not use floor polish or wax that makes floors slippery. If you have to use wax, make sure that it is non-skid floor wax.  Remove throw rugs and other tripping hazards from the floor. WHAT CAN I DO IN THE STAIRWAYS?  Do not leave any items on the stairs.  Make sure that there are handrails on both sides of the stairs. Fix handrails that are broken or loose. Make sure that handrails are as long as the stairways.  Check any carpeting to make sure that it is firmly attached to  the stairs. Fix any carpet that is loose or worn.  Avoid having throw rugs at the top or bottom of stairways, or secure the rugs with carpet tape to prevent them from moving.  Make sure that you have a light switch at the top of the stairs and the bottom of the stairs. If you do not have them, have them installed. WHAT ARE SOME OTHER FALL PREVENTION TIPS?  Wear closed-toe shoes that fit well and support your feet. Wear shoes that have rubber soles or low heels.  When you use a stepladder, make sure that it is completely opened and that the sides are firmly locked. Have someone hold the ladder while you are using it. Do not climb a closed stepladder.  Add color or contrast paint or  tape to grab bars and handrails in your home. Place contrasting color strips on the first and last steps.  Use mobility aids as needed, such as canes, walkers, scooters, and crutches.  Turn on lights if it is dark. Replace any light bulbs that burn out.  Set up furniture so that there are clear paths. Keep the furniture in the same spot.  Fix any uneven floor surfaces.  Choose a carpet design that does not hide the edge of steps of a stairway.  Be aware of any and all pets.  Review your medicines with your healthcare provider. Some medicines can cause dizziness or changes in blood pressure, which increase your risk of falling. Talk with your health care provider about other ways that you can decrease your risk of falls. This may include working with a physical therapist or trainer to improve your strength, balance, and endurance.   This information is not intended to replace advice given to you by your health care provider. Make sure you discuss any questions you have with your health care provider.   Document Released: 10/13/2002 Document Revised: 03/09/2015 Document Reviewed: 11/27/2014 Elsevier Interactive Patient Education 2016 [employer]. Steps to Quit Smoking  Smoking tobacco can be harmful to your health and can affect almost every organ in your body. Smoking puts you, and those around you, at risk for developing many serious chronic diseases. Quitting smoking is difficult, but it is one of the best things that you can do for your health. It is never too late to quit. WHAT ARE THE BENEFITS OF QUITTING SMOKING? When you quit smoking, you lower your risk of developing serious diseases and conditions, such as:  Lung cancer or lung disease, such as COPD.  Heart disease.  Stroke.  Heart attack.  Infertility.  Osteoporosis and bone fractures. Additionally, symptoms such as coughing, wheezing, and shortness of breath may get better when you quit. You may also find that you get  sick less often because your body is stronger at fighting off colds and infections. If you are pregnant, quitting smoking can help to reduce your chances of having a baby of low birth weight. HOW DO I GET READY TO QUIT? When you decide to quit smoking, create a plan to make sure that you are successful. Before you quit:  Pick a date to quit. Set a date within the next two weeks to give you time to prepare.  Write down the reasons why you are quitting. Keep this list in places where you will see it often, such as on your bathroom mirror or in your car or wallet.  Identify the people, places, things, and activities that make you want to smoke (triggers) and avoid them.  Make sure to take these actions:  Throw away all cigarettes at home, at work, and in your car.  Throw away smoking accessories, such as Scientist, research (medical).  Clean your car and make sure to empty the ashtray.  Clean your home, including curtains and carpets.  Tell your family, friends, and coworkers that you are quitting. Support from your loved ones can make quitting easier.  Talk with your health care provider about your options for quitting smoking.  Find out what treatment options are covered by your health insurance. WHAT STRATEGIES CAN I USE TO QUIT SMOKING?  Talk with your healthcare provider about different strategies to quit smoking. Some strategies include:  Quitting smoking altogether instead of gradually lessening how much you smoke over a period of time. Research shows that quitting "cold [place]" is more successful than gradually quitting.  Attending in-person counseling to help you build problem-solving skills. You are more likely to have success in quitting if you attend several counseling sessions. Even short sessions of 10 minutes can be effective.  Finding resources and support systems that can help you to quit smoking and remain smoke-free after you quit. These resources are most helpful when you use  them often. They can include:  Online chats with a Social worker.  Telephone quitlines.  Printed Furniture conservator/restorer.  Support groups or group counseling.  Text messaging programs.  Mobile phone applications.  Taking medicines to help you quit smoking. (If you are pregnant or breastfeeding, talk with your health care provider first.) Some medicines contain nicotine and some do not. Both types of medicines help with cravings, but the medicines that include nicotine help to relieve withdrawal symptoms. Your health care provider may recommend:  Nicotine patches, gum, or lozenges.  Nicotine inhalers or sprays.  Non-nicotine medicine that is taken by mouth. Talk with your health care provider about combining strategies, such as taking medicines while you are also receiving in-person counseling. Using these two strategies together makes you more likely to succeed in quitting than if you used either strategy on its own. If you are pregnant or breastfeeding, talk with your health care provider about finding counseling or other support strategies to quit smoking. Do not take medicine to help you quit smoking unless told to do so by your health care provider. WHAT THINGS CAN I DO TO MAKE IT EASIER TO QUIT? Quitting smoking might feel overwhelming at first, but there is a lot that you can do to make it easier. Take these important actions:  Reach out to your family and friends and ask that they support and encourage you during this time. Call telephone quitlines, reach out to support groups, or work with a counselor for support.  Ask people who smoke to avoid smoking around you.  Avoid places that trigger you to smoke, such as bars, parties, or smoke-break areas at work.  Spend time around people who do not smoke.  Lessen stress in your life, because stress can be a smoking trigger for some people. To lessen stress, try:  Exercising regularly.  Deep-breathing  exercises.  Yoga.  Meditating.  Performing a body scan. This involves closing your eyes, scanning your body from head to toe, and noticing which parts of your body are particularly tense. Purposefully relax the muscles in those areas.  Download or purchase mobile phone or tablet apps (applications) that can help you stick to your quit plan by providing reminders, tips, and encouragement. There are many free apps, such as QuitGuide from the CDC (  Centers for Disease Control and Prevention). You can find other support for quitting smoking (smoking cessation) through smokefree.gov and other websites. HOW WILL I FEEL WHEN I QUIT SMOKING? Within the first 24 hours of quitting smoking, you may start to feel some withdrawal symptoms. These symptoms are usually most noticeable 2-3 days after quitting, but they usually do not last beyond 2-3 weeks. Changes or symptoms that you might experience include:  Mood swings.  Restlessness, anxiety, or irritation.  Difficulty concentrating.  Dizziness.  Strong cravings for sugary foods in addition to nicotine.  Mild weight gain.  Constipation.  Nausea.  Coughing or a sore throat.  Changes in how your medicines work in your body.  A depressed mood.  Difficulty sleeping (insomnia). After the first 2-3 weeks of quitting, you may start to notice more positive results, such as:  Improved sense of smell and taste.  Decreased coughing and sore throat.  Slower heart rate.  Lower blood pressure.  Clearer skin.  The ability to breathe more easily.  Fewer sick days. Quitting smoking is very challenging for most people. Do not get discouraged if you are not successful the first time. Some people need to make many attempts to quit before they achieve long-term success. Do your best to stick to your quit plan, and talk with your health care provider if you have any questions or concerns.   This information is not intended to replace advice given to  you by your health care provider. Make sure you discuss any questions you have with your health care provider.   Document Released: 10/17/2001 Document Revised: 03/09/2015 Document Reviewed: 03/09/2015 Elsevier Interactive Patient Education 2016 [employer]. Osteoporosis Osteoporosis happens when your bones become thinner and weaker. Weak bones can break (fracture) more easily when you slip or fall. Bones most at risk of breaking are in the hip, wrist, and spine. HOME CARE  Get enough calcium and vitamin D. These nutrients are good for your bones.  Exercise as told by your doctor.  Do not use any tobacco products. This includes cigarettes, chewing tobacco, and electronic cigarettes. If you need help quitting, ask your doctor.  Limit the amount of alcohol you drink.  Take medicines only as told by your doctor.  Keep all follow-up visits as told by your doctor. This is important.  Take care at home to prevent falls. Some ways to do this are:  Keep rooms well lit and tidy.  Put safety rails on your stairs.  Put a rubber mat in the bathroom and other places that are often wet or slippery. GET HELP RIGHT AWAY IF:  You fall.  You hurt yourself.   This information is not intended to replace advice given to you by your health care provider. Make sure you discuss any questions you have with your health care provider.   Document Released: 01/15/2012 Document Revised: 11/13/2014 Document Reviewed: 04/02/2014 Elsevier Interactive Patient Education [employer].

## 2016-03-22 NOTE — Progress Notes (Signed)
BP 116/68 mmHg  Pulse 89  Temp(Src) 98.2 F (36.8 C)  Resp 16  Ht 5\' 3"  (1.6 m)  Wt 115 lb 8 oz (52.39 kg)  BMI 20.46 kg/m2  SpO2 96%   Subjective:    Patient ID: Samantha Dawson, female    DOB: 09-16-1929, 82 y.o.   MRN: [id number]  HPI: CHANNEL TEE is a 82 y.o. female  Chief Complaint  Patient presents with  . Pain    pt here for med refills   Patient is new to me; her usual provider is out of the office for an extended period of time  She has been taking hydrocodone for years; she says she has arthritis in her back and hands; some constipation from the medicine but not too bad, no blood in the stool; does not make her feel loopy or drunk; she takes it regularly, four times a day; she dose has been stable She feels like her arthritis is about the same; when the weather changes it flares up some Other remedies for pain: "xanax" she says She takes xanax, which helps, but does not take them all the time; I asked why... "It helps... It relaxes me", "the pain some"; she does not take it all the time; she does not take every day; last dose was half of a pill night before last She has not had any imaging of her back; hurts between shoulder blades She does not use motrin or aleve; I asked if we could decrease her hydrocodone and try some other pain medicine; she says let's don't do that right now; I asked for pain level; she took a pain pill before coming, pain is a 3 or 4 out of 10; worse level is up to a 10   Depression screen [facility] 2/9 10/25/2015 05/25/2015  Decreased Interest 0 0  Down, Depressed, Hopeless 0 0  PHQ - 2 Score 0 0   Relevant past medical, surgical, family and social history reviewed Past Medical History  Diagnosis Date  . Osteoarthritis   . Anxiety   . Encounter for chronic pain management   . Hyperlipidemia   . Hypertension   . Controlled substance agreement signed 03/22/2016  . Osteoporosis 03/22/2016   Past Surgical History  Procedure Laterality Date    . Renal stenting    . Endaryerectomy    . Cataract extraction    . Abdominal hysterectomy     Family History  Problem Relation Age of Onset  . Cancer Mother   . Coronary artery disease Father   . Cerebrovascular Accident Father    Social History  Substance Use Topics  . Smoking status: Current Every Day Smoker -- 0.20 packs/day    Types: Cigarettes  . Smokeless tobacco: None  . Alcohol Use: No  MD note: social hx said she was a former smoker, but I asked directly, she told me that she is still smoking; smoking about 3 or 4 cigs a day, sometimes more, sometimes less, used to be a heavy smoker; tobacco abuse updated  Interim medical history since last visit reviewed. Allergies and medications reviewed  Review of Systems Per HPI unless specifically indicated above     Objective:    BP 116/68 mmHg  Pulse 89  Temp(Src) 98.2 F (36.8 C)  Resp 16  Ht 5\' 3"  (1.6 m)  Wt 115 lb 8 oz (52.39 kg)  BMI 20.46 kg/m2  SpO2 96%  Wt Readings from Last 3 Encounters:  03/22/16 115 lb 8  oz (52.39 kg)  10/25/15 115 lb 3.2 oz (52.254 kg)  07/22/15 116 lb 4.8 oz (52.753 kg)    Physical Exam  Constitutional: She appears well-developed and well-nourished. No distress.  Eyes: No scleral icterus.  Neck: No JVD present.  Cardiovascular: Normal rate and regular rhythm.   Pulmonary/Chest: Effort normal and breath sounds normal. No respiratory distress. She exhibits deformity (thoracic kyphosis).  Abdominal: She exhibits no distension.  Musculoskeletal: She exhibits no edema.  Neurological: She is alert. She displays no tremor.  Skin: No pallor.  Psychiatric: She has a normal mood and affect.   Results for orders placed or performed in visit on 03/22/16  ToxASSURE Select 67 (MW), Urine  Result Value Ref Range   ToxAssure Select 13 FINAL       Assessment & Plan:   Problem List Items Addressed This Visit      Cardiovascular and Mediastinum   Essential hypertension    Well-controlled  today        Musculoskeletal and Integument   Osteoporosis    Reviewed last DEXA; ordered DEXA to be done in June; fall precautions; STOP benzo which may increase likelihood of falls; three servings of calcium daily; stop smoking      Relevant Orders   DG Bone Density     Other   Acute anxiety    Listed as chronic problem; I advised patient to STOP her benzo; not using enough by her own report to need taper; I will not refill benzo      Back pain - Primary   Relevant Orders   DG Thoracic Spine W/Swimmers (Completed)   Chronic pain    Patient has been on opiates for "years" for her back; since there are no recent imaging studies and she has osteoporosis, will get T-spine xrays to see if any compression fractures; I will continue her pain medicine as prescribed her her usual doctor, but cautioned her about the risk of taking concurrent benzos and opiates; I will have her stop the benzos; cited the FDA press release from Jul 07, 2015 regarding danger including risk of death; toxassure UDS done; limited Rx given until testing is back      Controlled substance agreement signed    Discussed risk of controlled substances including possible unintentional overdose, especially if mixed with alcohol or other pills; typical speech given including illegal to share, even out of the goodness of patient's heart, always keep in the original bottle, safeguard medicine, do NOT mix with alcohol, other pain pills, "nerve" or anxiety pills, or sleeping pills; I am not obligated to approve of early refill or give new prescription if medicine is lost, stolen, or destroyed even with a police report, etc.; patient agrees with plan; controlled substance contract signed; copy of contract given to patient       Tobacco abuse    Urged patient to stop smoking; see AVS       Other Visit Diagnoses    Encounter for therapeutic drug monitoring        Relevant Orders    ToxASSURE Select 13 (MW), Urine (Completed)         Follow up plan: Return in about 6 weeks (around 05/03/2016) for next follow-up.  An after-visit summary was printed and given to the patient at [facility].  Please see the patient instructions which may contain other information and recommendations beyond what is mentioned above in the assessment and plan.  Meds ordered this encounter  Medications  . DISCONTD: HYDROcodone-acetaminophen (  NORCO) 10-325 MG tablet    Sig: Take 1 tablet by mouth every 6 (six) hours as needed.    Dispense:  40 tablet    Refill:  0    Ten day supply until tests are back    Orders Placed This Encounter  Procedures  . DG Thoracic Spine W/Swimmers  . DG Bone Density  . ToxASSURE Select 13 (MW), Urine

## 2016-03-24 ENCOUNTER — Other Ambulatory Visit: Payer: Self-pay | Admitting: Family Medicine

## 2016-03-30 ENCOUNTER — Telehealth: Payer: Self-pay | Admitting: Family Medicine

## 2016-03-30 DIAGNOSIS — G8929 Other chronic pain: Secondary | ICD-10-CM

## 2016-03-30 LAB — TOXASSURE SELECT 13 (MW), URINE

## 2016-03-30 NOTE — Telephone Encounter (Signed)
Pt states she would like her results for her urine for her pain medications. Pt would like to know when she can come and pick up her pain meds RX.

## 2016-04-02 MED ORDER — HYDROCODONE-ACETAMINOPHEN 10-325 MG PO TABS: 1.0000 | ORAL_TABLET | Freq: Four times a day (QID) | ORAL | Status: DC | PRN

## 2016-04-02 NOTE — Telephone Encounter (Signed)
rx ready to be picked up

## 2016-04-04 NOTE — Telephone Encounter (Signed)
Pt.notified

## 2016-04-05 ENCOUNTER — Ambulatory Visit
Admission: RE | Admit: 2016-04-05 | Discharge: 2016-04-05 | Disposition: A | Discharge status: Home / Self Care | Payer: Medicare Other | Source: Ambulatory Visit | Attending: Family Medicine | Admitting: Family Medicine

## 2016-04-05 DIAGNOSIS — M546 Pain in thoracic spine: Secondary | ICD-10-CM | POA: Diagnosis not present

## 2016-04-05 DIAGNOSIS — M5134 Other intervertebral disc degeneration, thoracic region: Secondary | ICD-10-CM | POA: Diagnosis not present

## 2016-04-13 ENCOUNTER — Other Ambulatory Visit: Payer: Self-pay | Admitting: Family Medicine

## 2016-04-17 ENCOUNTER — Encounter: Payer: Self-pay | Admitting: Family Medicine

## 2016-04-17 DIAGNOSIS — Z87891 Personal history of nicotine dependence: Secondary | ICD-10-CM

## 2016-04-17 HISTORY — DX: Personal history of nicotine dependence: Z87.891

## 2016-04-17 NOTE — Assessment & Plan Note (Signed)
Urged patient to stop smoking; see AVS

## 2016-04-17 NOTE — Assessment & Plan Note (Signed)
Well controlled today.

## 2016-04-17 NOTE — Assessment & Plan Note (Signed)
Listed as chronic problem; I advised patient to STOP her benzo; not using enough by her own report to need taper; I will not refill benzo

## 2016-04-17 NOTE — Assessment & Plan Note (Signed)
Patient has been on opiates for "years" for her back; since there are no recent imaging studies and she has osteoporosis, will get T-spine xrays to see if any compression fractures; I will continue her pain medicine as prescribed her her usual doctor, but cautioned her about the risk of taking concurrent benzos and opiates; I will have her stop the benzos; cited the FDA press release from Jul 07, 2015 regarding danger including risk of death; toxassure UDS done; limited Rx given until testing is back

## 2016-04-17 NOTE — Assessment & Plan Note (Signed)
Reviewed last DEXA; ordered DEXA to be done in June; fall precautions; STOP benzo which may increase likelihood of falls; three servings of calcium daily; stop smoking

## 2016-04-24 ENCOUNTER — Other Ambulatory Visit: Payer: Self-pay | Admitting: Family Medicine

## 2016-05-03 ENCOUNTER — Encounter: Payer: Self-pay | Admitting: Family Medicine

## 2016-05-03 ENCOUNTER — Ambulatory Visit (INDEPENDENT_AMBULATORY_CARE_PROVIDER_SITE_OTHER): Payer: Medicare Other | Admitting: Family Medicine

## 2016-05-03 VITALS — BP 104/78 | HR 82 | Temp 97.8°F | Resp 14 | Wt 113.2 lb

## 2016-05-03 DIAGNOSIS — Z5181 Encounter for therapeutic drug level monitoring: Secondary | ICD-10-CM

## 2016-05-03 DIAGNOSIS — M19041 Primary osteoarthritis, right hand: Secondary | ICD-10-CM | POA: Diagnosis not present

## 2016-05-03 DIAGNOSIS — M5134 Other intervertebral disc degeneration, thoracic region: Secondary | ICD-10-CM | POA: Diagnosis not present

## 2016-05-03 DIAGNOSIS — E875 Hyperkalemia: Secondary | ICD-10-CM | POA: Insufficient documentation

## 2016-05-03 DIAGNOSIS — E785 Hyperlipidemia, unspecified: Secondary | ICD-10-CM

## 2016-05-03 DIAGNOSIS — M810 Age-related osteoporosis without current pathological fracture: Secondary | ICD-10-CM | POA: Diagnosis not present

## 2016-05-03 DIAGNOSIS — M19042 Primary osteoarthritis, left hand: Secondary | ICD-10-CM | POA: Diagnosis not present

## 2016-05-03 DIAGNOSIS — R748 Abnormal levels of other serum enzymes: Secondary | ICD-10-CM | POA: Diagnosis not present

## 2016-05-03 DIAGNOSIS — E46 Unspecified protein-calorie malnutrition: Secondary | ICD-10-CM | POA: Diagnosis not present

## 2016-05-03 DIAGNOSIS — G8929 Other chronic pain: Secondary | ICD-10-CM

## 2016-05-03 DIAGNOSIS — Z720 Tobacco use: Secondary | ICD-10-CM

## 2016-05-03 HISTORY — DX: Other intervertebral disc degeneration, thoracic region: M51.34

## 2016-05-03 HISTORY — DX: Primary osteoarthritis, right hand: M19.041

## 2016-05-03 LAB — CBC WITH DIFFERENTIAL/PLATELET
BASOS PCT: 1 %
Basophils Absolute: 64 cells/uL (ref 0–200)
EOS ABS: 128 {cells}/uL (ref 15–500)
Eosinophils Relative: 2 %
HEMATOCRIT: 43.2 % (ref 35.0–45.0)
HEMOGLOBIN: 14.1 g/dL (ref 11.7–15.5)
LYMPHS ABS: 2368 {cells}/uL (ref 850–3900)
LYMPHS PCT: 37 %
MCH: 32.3 pg (ref 27.0–33.0)
MCHC: 32.6 g/dL (ref 32.0–36.0)
MCV: 99.1 fL (ref 80.0–100.0)
MONO ABS: 640 {cells}/uL (ref 200–950)
MPV: 10.6 fL (ref 7.5–12.5)
Monocytes Relative: 10 %
NEUTROS PCT: 50 %
Neutro Abs: 3200 cells/uL (ref 1500–7800)
Platelets: 173 10*3/uL (ref 140–400)
RBC: 4.36 MIL/uL (ref 3.80–5.10)
RDW: 13.3 % (ref 11.0–15.0)
WBC: 6.4 10*3/uL (ref 3.8–10.8)

## 2016-05-03 LAB — COMPLETE METABOLIC PANEL WITH GFR
ALT: 9 U/L (ref 6–29)
AST: 18 U/L (ref 10–35)
Albumin: 4.4 g/dL (ref 3.6–5.1)
Alkaline Phosphatase: 128 U/L (ref 33–130)
BUN: 17 mg/dL (ref 7–25)
CHLORIDE: 105 mmol/L (ref 98–110)
CO2: 25 mmol/L (ref 20–31)
CREATININE: 0.82 mg/dL (ref 0.60–0.88)
Calcium: 9.6 mg/dL (ref 8.6–10.4)
GFR, EST AFRICAN AMERICAN: 76 mL/min (ref >=60)
GFR, Est Non African American: 66 mL/min (ref >=60)
Glucose, Bld: 105 mg/dL — ABNORMAL HIGH (ref 65–99)
POTASSIUM: 4.6 mmol/L (ref 3.5–5.3)
Sodium: 142 mmol/L (ref 135–146)
Total Bilirubin: 0.7 mg/dL (ref 0.2–1.2)
Total Protein: 7.2 g/dL (ref 6.1–8.1)

## 2016-05-03 LAB — LIPID PANEL
Cholesterol: 121 mg/dL — ABNORMAL LOW (ref 125–200)
HDL: 49 mg/dL (ref >=46)
LDL CALC: 31 mg/dL (ref <130)
TRIGLYCERIDES: 203 mg/dL — AB (ref <150)
Total CHOL/HDL Ratio: 2.5 Ratio (ref <=5.0)
VLDL: 41 mg/dL — AB (ref <30)

## 2016-05-03 LAB — VITAMIN B12: Vitamin B-12: 413 pg/mL (ref 200–1100)

## 2016-05-03 MED ORDER — HYDROCODONE-ACETAMINOPHEN 10-325 MG PO TABS: 0.5000 | ORAL_TABLET | Freq: Four times a day (QID) | ORAL | Status: DC | PRN

## 2016-05-03 NOTE — Assessment & Plan Note (Signed)
She is working on quitting; I am here to help if/when needed; she wishes to cut down gradually

## 2016-05-03 NOTE — Progress Notes (Signed)
BP 104/78   Pulse 82   Temp 97.8 F (36.6 C) (Oral)   Resp 14   Wt 113 lb 3.2 oz (51.3 kg)   SpO2 91%   BMI 20.05 kg/m    Subjective:    Patient ID: Samantha Dawson, female    DOB: [birth year], 82 y.o.   MRN: [id number]  HPI: EARLY ORD is a 82 y.o. female  Chief Complaint  Patient presents with  . Follow-up    6 weeks  . Medication Refill   She is here for follow-up Has pain in her back;taking pain medicine; does not make her feel loopy or goofy; no problems with constipation; she did not bring her pill bottles with her; she is using 4 hydrocodone a day; she has enough left for the rest of today and tomorrow and needs to be refilled on the 30th Pain level goes up to a 10 without pain medicine, "sometimes worse"; took her all day long to cook for church homecoming; her back hurt so bad she would have to stop and sit down; with pain medicine, level comes down to a 3 or 4 out of 10  T-spine xray done Apr 05, 2016 IMPRESSION: There is no compression fracture nor other acute bony abnormality of the thoracic spine. There is mild multilevel degenerative disc disease.  Electronically Signed  By: David Martinique M.D.  On: 04/05/2016 14:51  Also in her hands; stiff; pain when trying to make a first Has had arthritis for years; never tested for RA; arthritis ran in the family She rubs the hands for relief; does not do much else; just rubs them  She has not gone for bone density study yet  Labs reviewed from January High alk phos 155 in January 2017; no abd pain or liver pain; no nausea; no vomiting; no jaundice High K+ level 5.3 in January as well; uses plain sea salt; no artifical salt or salt substitutes; no chest pain, no skipped heartbeats  Smoking, cutting down, wants to stop gradually; not cold [place]  Emphysema; not seeing a lung doctor; does have problems with breathing; using inhalers; she can do most of her chores, but not as much as before  High cholesterol;  did not coome fasting; had toast and canteloupe and egg; black coffee  Has acid reflux; she only takes it when needed; takes maybe 4-5 times a week; acid controlled with that; no blood in the stool  Depression screen [facility] 2/9 05/03/2016 10/25/2015 05/25/2015  Decreased Interest 0 0 0  Down, Depressed, Hopeless 0 0 0  PHQ - 2 Score 0 0 0   Relevant past medical, surgical, family and social history reviewed Past Medical History:  Diagnosis Date  . Anxiety   . Controlled substance agreement signed 03/22/2016  . 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 Encounter for chronic pain management   . Hyperlipidemia   . Hypertension   . Osteoarthritis   . Osteoporosis 03/22/2016  . Primary osteoarthritis of both hands 05/03/2016   Past Surgical History:  Procedure Laterality Date  . ABDOMINAL HYSTERECTOMY    . CATARACT EXTRACTION    . endaryerectomy    . renal stenting     Family History  Problem Relation Age of Onset  . Cancer Mother   . Coronary artery disease Father   . Cerebrovascular Accident Father    Social History  Substance Use Topics  . Smoking status: Current Every Day Smoker    Packs/day: 0.20    Types:  Cigarettes  . Smokeless tobacco: Never Used  . Alcohol use No    Interim medical history since last visit reviewed. Allergies and medications reviewed  Review of Systems Per HPI unless specifically indicated above     Objective:    BP 104/78   Pulse 82   Temp 97.8 F (36.6 C) (Oral)   Resp 14   Wt 113 lb 3.2 oz (51.3 kg)   SpO2 91%   BMI 20.05 kg/m   Wt Readings from Last 3 Encounters:  06/02/16 113 lb (51.3 kg)  05/03/16 113 lb 3.2 oz (51.3 kg)  03/22/16 115 lb 8 oz (52.4 kg)    Physical Exam  Constitutional: She appears well-developed and well-nourished. No distress.  HENT:  Head: Normocephalic and atraumatic.  Eyes: EOM are normal. No scleral icterus.  Neck: No thyromegaly present.  Cardiovascular: Normal rate, regular rhythm and normal  heart sounds.   No murmur heard. Pulmonary/Chest: Effort normal and breath sounds normal. No respiratory distress. She has no wheezes.  Abdominal: Soft. Bowel sounds are normal. She exhibits no distension.  Musculoskeletal: Normal range of motion. She exhibits no edema.       Right hand: She exhibits deformity (heberdens and bouchards nodes several fingers).       Left hand: She exhibits bony tenderness (tender over the 4th PIP LEFT hand) and deformity (heberdens and bouchards nodes several fingers).  Neurological: She is alert. She exhibits normal muscle tone.  Skin: Skin is warm and dry. She is not diaphoretic. No pallor.  Palmar erythema  Psychiatric: She has a normal mood and affect. Her behavior is normal. Judgment and thought content normal.      Assessment & Plan:   Problem List Items Addressed This Visit      Musculoskeletal and Integument   Primary osteoarthritis of both hands - Primary    With heberdens and bouchards nodes; significant arthritis elsewhere; on pain medicine      Degenerative disc disease, thoracic   Relevant Orders   Ambulatory referral to Physical Therapy     Other   Tobacco abuse    She is working on quitting; I am here to help if/when needed; she wishes to cut down gradually      Medication monitoring encounter   Relevant Orders   CBC with Differential/Platelet (Completed)   Vitamin B12 (Completed)   Hyperlipidemia    Recheck today with other labs; try to limit saturated fats      Relevant Orders   Lipid panel (Completed)   Hyperkalemia    Recheck; on ARB; avoid salt substitutes      Elevated serum alkaline phosphatase level   Relevant Orders   COMPLETE METABOLIC PANEL WITH GFR (Completed)   VITAMIN D 25 Hydroxy (Vit-D Deficiency, Fractures) (Completed)   Alkaline Phosphatase Isoenzymes (Completed)   Chronic pain    I inherited this patient on narcotics; we have controlled substance agreement signed; UDS done; I will plan to continue her  on current regimen for now       Other Visit Diagnoses   None.      Follow up plan: Return in about 1 month (around 06/02/2016) for chronic pain and medication management.  An after-visit summary was printed and given to the patient at [facility] [place].  Please see the patient instructions which may contain other information and recommendations beyond what is mentioned above in the assessment and plan.  Meds ordered this encounter  Medications  . DISCONTD: HYDROcodone-acetaminophen (NORCO) 10-325 MG tablet  Sig: Take 0.5-1 tablets by mouth every 6 (six) hours as needed.    Dispense:  105 tablet    Refill:  0    Weaning gradually, 105 pills to last 30 days    Orders Placed This Encounter  Procedures  . CBC with Differential/Platelet  . Lipid panel  . COMPLETE METABOLIC PANEL WITH GFR  . Vitamin B12  . VITAMIN D 25 Hydroxy (Vit-D Deficiency, Fractures)  . Alkaline Phosphatase Isoenzymes  . Ambulatory referral to Physical Therapy

## 2016-05-03 NOTE — Patient Instructions (Addendum)
Try paraffin wax treatment for hands Don't do turmeric Bring your pill bottle back to the office for the pill count and identification, then we can give you your prescription We'll refer you to physical therapy as an adjunct to medicine Our eventual goal is to wean you down slightly on your pain medicine For the next month, instead of taking 4 whole pills a day, wean down ever so slightly to 3-1/2 pills a day We'll get labs today and contact you with results  I do encourage you to quit smoking Call [phone] to sign up for smoking cessation classes You can call 1-800-QUIT-NOW to talk with a smoking cessation coach   Osteoarthritis Osteoarthritis is a disease that causes soreness and inflammation of a joint. It occurs when the cartilage at the affected joint wears down. Cartilage acts as a cushion, covering the ends of bones where they meet to form a joint. Osteoarthritis is the most common form of arthritis. It often occurs in older people. The joints affected most often by this condition include those in the:  Ends of the fingers.  Thumbs.  Neck.  Lower back.  Knees.  Hips. CAUSES  Over time, the cartilage that covers the ends of bones begins to wear away. This causes bone to rub on bone, producing pain and stiffness in the affected joints.  RISK FACTORS Certain factors can increase your chances of having osteoarthritis, including:  Older age.  Excessive body weight.  Overuse of joints.  Previous joint injury. SIGNS AND SYMPTOMS   Pain, swelling, and stiffness in the joint.  Over time, the joint may lose its normal shape.  Small deposits of bone (osteophytes) may grow on the edges of the joint.  Bits of bone or cartilage can break off and float inside the joint space. This may cause more pain and damage. DIAGNOSIS  Your health care provider will do a physical exam and ask about your symptoms. Various tests may be ordered, such as:  X-rays of the affected  joint.  Blood tests to rule out other types of arthritis. Additional tests may be used to diagnose your condition. TREATMENT  Goals of treatment are to control pain and improve joint function. Treatment plans may include:  A prescribed exercise program that allows for rest and joint relief.  A weight control plan.  Pain relief techniques, such as:  Properly applied heat and cold.  Electric pulses delivered to nerve endings under the skin (transcutaneous electrical nerve stimulation [TENS]).  Massage.  Certain nutritional supplements.  Medicines to control pain, such as:  Acetaminophen.  Nonsteroidal anti-inflammatory drugs (NSAIDs), such as naproxen.  Narcotic or central-acting agents, such as tramadol.  Corticosteroids. These can be given orally or as an injection.  Surgery to reposition the bones and relieve pain (osteotomy) or to remove loose pieces of bone and cartilage. Joint replacement may be needed in advanced states of osteoarthritis. HOME CARE INSTRUCTIONS   Take medicines only as directed by your health care provider.  Maintain a healthy weight. Follow your health care provider's instructions for weight control. This may include dietary instructions.  Exercise as directed. Your health care provider can recommend specific types of exercise. These may include:  Strengthening exercises. These are done to strengthen the muscles that support joints affected by arthritis. They can be performed with weights or with exercise bands to add resistance.  Aerobic activities. These are exercises, such as brisk walking or low-impact aerobics, that get your heart pumping.  Range-of-motion activities. These keep your  joints limber.  Balance and agility exercises. These help you maintain daily living skills.  Rest your affected joints as directed by your health care provider.  Keep all follow-up visits as directed by your health care provider. SEEK MEDICAL CARE IF:    Your skin turns red.  You develop a rash in addition to your joint pain.  You have worsening joint pain.  You have a fever along with joint or muscle aches. SEEK IMMEDIATE MEDICAL CARE IF:  You have a significant loss of weight or appetite.  You have night sweats. [place] of Arthritis and Musculoskeletal and Skin Diseases: www.niams.SouthExposed.es  [employer] on Aging: http://kim-miller.com/  American College of Rheumatology: www.rheumatology.org   This information is not intended to replace advice given to you by your health care provider. Make sure you discuss any questions you have with your health care provider.   Document Released: 10/23/2005 Document Revised: 11/13/2014 Document Reviewed: 06/30/2013 Elsevier Interactive Patient Education [employer].

## 2016-05-03 NOTE — Assessment & Plan Note (Signed)
Recheck today with other labs; try to limit saturated fats

## 2016-05-04 LAB — VITAMIN D 25 HYDROXY (VIT D DEFICIENCY, FRACTURES): Vit D, 25-Hydroxy: 37 ng/mL (ref 30–100)

## 2016-05-08 LAB — ALKALINE PHOSPHATASE ISOENZYMES
Alkaline Phonsphatase: 129 U/L (ref 33–130)
Bone Isoenzymes: 86 % — ABNORMAL HIGH (ref 28–66)
Intestinal Isoenzymes: 0 % — ABNORMAL LOW (ref 1–24)
LIVER ISOENZYMES (ALP ISO): 14 % — AB (ref 25–69)
MACROHEPATIC ISOENZYMES: 0 %

## 2016-05-10 ENCOUNTER — Other Ambulatory Visit: Payer: Self-pay | Admitting: Family Medicine

## 2016-05-10 DIAGNOSIS — R748 Abnormal levels of other serum enzymes: Secondary | ICD-10-CM

## 2016-05-10 NOTE — Assessment & Plan Note (Signed)
Nuclear medicine bone scan and DEXA ordered

## 2016-05-15 ENCOUNTER — Telehealth: Payer: Self-pay | Admitting: Emergency Medicine

## 2016-05-15 ENCOUNTER — Encounter: Payer: Self-pay | Admitting: Family Medicine

## 2016-05-15 NOTE — Telephone Encounter (Signed)
I ordered the bone scan on May 10, 2016

## 2016-05-15 NOTE — Telephone Encounter (Signed)
Patient notified of lab results. Bone Density has been scheduled for July 31. Please put in referral for bone scan

## 2016-05-19 ENCOUNTER — Telehealth: Payer: Self-pay | Admitting: Emergency Medicine

## 2016-05-19 NOTE — Telephone Encounter (Signed)
Patient notified

## 2016-05-25 ENCOUNTER — Ambulatory Visit: Payer: Medicare Other | Attending: Family Medicine

## 2016-05-25 ENCOUNTER — Other Ambulatory Visit: Payer: Self-pay | Admitting: Family Medicine

## 2016-05-25 VITALS — BP 107/66 | HR 71

## 2016-05-25 DIAGNOSIS — M546 Pain in thoracic spine: Secondary | ICD-10-CM | POA: Diagnosis not present

## 2016-05-25 DIAGNOSIS — M545 Low back pain: Secondary | ICD-10-CM | POA: Insufficient documentation

## 2016-05-25 DIAGNOSIS — M6281 Muscle weakness (generalized): Secondary | ICD-10-CM | POA: Insufficient documentation

## 2016-05-25 NOTE — Patient Instructions (Signed)
Scapular Retraction: Rowing (Eccentric) - Arms - 45 Degrees (Resistance Band)     Scapular Retraction (Standing or sitting)   With arms at sides, pinch shoulder blades together.  Repeat __10__ times per set. Do __3__ sets per session daily.

## 2016-05-25 NOTE — Therapy (Signed)
[facility] [address] [place], [address] Phone: [phone]   Fax:  [phone]  Physical Therapy Evaluation  Patient Details  Name: Samantha Dawson MRN: [id number] Date of Birth: [birth year] Referring Provider: Arnetha Courser, MD  Encounter Date: 05/25/2016      PT End of Session - 05/25/16 0935    Visit Number 1   Number of Visits 13   Date for PT Re-Evaluation 07/06/16   Authorization Type 1   Authorization Time Period of 10   PT Start Time 0935   PT Stop Time 1044   PT Time Calculation (min) 69 min   Activity Tolerance Patient tolerated treatment well   Behavior During Therapy [facility] for tasks assessed/performed      Past Medical History  Diagnosis Date  . Osteoarthritis   . Anxiety   . Encounter for chronic pain management   . Hyperlipidemia   . Hypertension   . Controlled substance agreement signed 03/22/2016  . Osteoporosis 03/22/2016  . Primary osteoarthritis of both hands 05/03/2016  . Degenerative disc disease, thoracic 05/03/2016    Past Surgical History  Procedure Laterality Date  . Renal stenting    . Endaryerectomy    . Cataract extraction    . Abdominal hysterectomy      Filed Vitals:   05/25/16 1002  BP: 107/66  Pulse: 71         Subjective Assessment - 05/25/16 0939    Subjective Thoracic back pain: 2-3/10 currently (took pain medication this morning). 10/10 at worst    Pertinent History Thoracic back pain. Gradual onset several years ago which progressively worsened. Has not had physical therapy for her back before. Had x-rays last month which revealed some deterioration. Denies bowel or bladder problems. Denies saddle anesthesia.    Patient Stated Goals Make the pain better.    Currently in Pain? Yes   Pain Score 3    Pain Location Thoracic   Pain Orientation Posterior   Pain Descriptors / Indicators Burning;Aching   Pain Type Chronic pain   Pain Onset Other  (comment)  Chronic   Pain Frequency Constant   Aggravating Factors  cooking, cleaning, lying down on her back. Staying in one position for a while.    Pain Relieving Factors pain medications            [facility] PT Assessment - 05/25/16 0936    Assessment   Medical Diagnosis Thoracic degenerative disc disease   Referring Provider Arnetha Courser, MD   Onset Date/Surgical Date 05/03/16  Date PT referral signed. Chronic condition   Next MD Visit 06/02/2016   Prior Therapy None for current condition   Precautions   Precaution Comments Osteoporosis   Restrictions   Other Position/Activity Restrictions No known restrictions   Balance Screen   Has the patient fallen in the past 6 months No   Has the patient had a decrease in activity level because of a fear of falling?  No   Is the patient reluctant to leave their home because of a fear of falling?  No   Home Environment   Additional Comments Pt lives alone in a 1 story home, no stairs   Prior Function   Vocation Retired   Biomedical scientist PLOF: better able to [employer], clean, perform chores with less back pain   Observation/Other Assessments   Modified [place] 46%   Posture/Postural Control   Posture Comments Bilaterally protracted shoulders, thoracic kyphosis, slight  R lumbar side bend, L thoracic side bend around L1/L2, R mid thoracic side bend, R thoracic rotation   AROM   Lumbar Flexion WFL with thoracic pulling sensation   Lumbar Extension limited with thoracic discomfort.  Most reproduction of pain   Lumbar - Right Side [facility] with thoracic discomfort   Lumbar - Left Side Bend limited with thoracic discomfort   Lumbar - Right Rotation [facility] with thoracic pain   Lumbar - Left Rotation [facility] with thoracic pain   Strength   Right Hip Flexion 4/5   Right Hip Extension 4/5  in L S/L   Right Hip ABduction 4+/5   Left Hip Flexion 4/5   Left Hip Extension 4+/5  in R S/L   Left Hip ABduction 4+/5   Right Knee Flexion 4+/5    Right Knee Extension 4+/5   Left Knee Flexion 4+/5   Left Knee Extension 5/5   Palpation   Palpation comment No TTP thoracic spine   Ambulation/Gait   Gait Comments Slight decreased stance R LE, slight R lateral lean during R LE stance phase, decreased bilateral hip extension, decreased step length bilaterally, decreased trunk rotation. Independent ambulation       Objectives  There-ex  Directed patient with standing bilateral scapular retraction 10x3   Reviewed and given as part of her HEP. Pt demonstrated and verbalized understanding.   Seated chin tucks 10x2. Mod to max cues needed.  Improved exercise technique, movement at target joints, use of target muscles after mod verbal, visual, tactile cues.    Decreased thoracic back pain after session per patient.                     PT Education - 05/25/16 1108    Education provided Yes   Education Details ther-ex, HEP, plan of care   Person(s) Educated Patient   Methods Explanation;Demonstration;Tactile cues;Verbal cues;Handout   Comprehension Verbalized understanding;Returned demonstration             PT Long Term Goals - 05/25/16 1052    PT LONG TERM GOAL #1   Title Patient will have a decrease in back pain to 5/10 or less at worst to promote ability to perform standing tasks such as cooking and cleaning.   Baseline 10/10 back pain at worst   Time 6   Period Weeks   Status New   PT LONG TERM GOAL #2   Title Patient will improve bilateral hip strength to promote ability to perform standing tasks with less back pain.    Time 6   Period Weeks   Status New   PT LONG TERM GOAL #3   Title Patient will improve her Modified Oswestry Low Back Pain Disability Questionnaire by at leats 6 points as a demonstration of improved function.    Baseline 46%   Time 6   Period Weeks   Status New               Plan - 05/25/16 1046    Clinical Impression Statement Patient is an 82 year old female who came  to physical therapy secondary to thoracic back pain. She also presents with poor posture, altered gait pattern, some LE weakness, most reproduction of symptoms with trunk extension as well as R and L rotation; and difficulty performing functional tasks such as cooking and performing chores. Patient will benefit from skilled physical therapy services to address the aforementioned deficits.    Rehab Potential Good   Clinical Impairments Affecting  Rehab Potential age, thoracic kyphosis, scoliosis, chronicity of condition   PT Frequency 2x / week   PT Duration 6 weeks   PT Treatment/Interventions Therapeutic exercise;Therapeutic activities;Manual techniques;Aquatic Therapy;Electrical Stimulation;Neuromuscular re-education;Patient/family education;Dry needling   PT Next Visit Plan posture, scapular retraction, gentle thoracic extension, strengthening   Consulted and Agree with Plan of Care Patient      Patient will benefit from skilled therapeutic intervention in order to improve the following deficits and impairments:  Pain, Postural dysfunction, Decreased strength, Improper body mechanics  Visit Diagnosis: Pain in thoracic spine - Plan: PT plan of care cert/re-cert  Low back pain, unspecified back pain laterality, with sciatica presence unspecified - Plan: PT plan of care cert/re-cert  Muscle weakness (generalized) - Plan: PT plan of care cert/re-cert      G-Codes - [id number]    Functional Assessment Tool Used Modified Oswestry Low Back Pain Disability Questionnaire, patient interview, clinical presentation   Functional Limitation Mobility: Walking and moving around   Mobility: Walking and Moving Around Current Status [id number]) At least 40 percent but less than 60 percent impaired, limited or restricted   Mobility: Walking and Moving Around Goal Status [phone]) At least 20 percent but less than 40 percent impaired, limited or restricted       Problem List Patient Active Problem List    Diagnosis Date Noted  . Primary osteoarthritis of both hands 05/03/2016  . Elevated serum alkaline phosphatase level 05/03/2016  . Hyperkalemia 05/03/2016  . Medication monitoring encounter 05/03/2016  . 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 Tobacco abuse 04/17/2016  . Controlled substance agreement signed 03/22/2016  . Osteoporosis 03/22/2016  . Back pain 03/22/2016  . Hyperlipidemia 10/25/2015  . Essential hypertension 10/25/2015  . Panlobular emphysema ([place]) 10/25/2015  . Protein calorie malnutrition ([place]) 05/26/2015  . Chronic pain 05/25/2015  . Acute anxiety 05/25/2015  . Actinic keratosis 05/25/2015   Thank you for your referral.  Joneen Boers PT, DPT   05/25/2016, 12:49 PM  [facility] [address] [place], [address] Phone: [phone]   Fax:  [phone]  Name: Samantha Dawson MRN: [id number] Date of Birth: 1929/10/14

## 2016-05-29 ENCOUNTER — Telehealth: Payer: Self-pay | Admitting: Family Medicine

## 2016-05-29 ENCOUNTER — Other Ambulatory Visit: Payer: Self-pay

## 2016-05-30 MED ORDER — METOPROLOL SUCCINATE ER 50 MG PO TB24: 50.0000 mg | ORAL_TABLET | Freq: Every day | ORAL | 2 refills | Status: DC

## 2016-05-30 MED ORDER — VALSARTAN 80 MG PO TABS: 80.0000 mg | ORAL_TABLET | Freq: Every day | ORAL | 2 refills | Status: DC

## 2016-05-30 NOTE — Telephone Encounter (Signed)
Please check on patient's nuclear medicine bone scan; that is not the same as a bone density test; thank you

## 2016-05-31 ENCOUNTER — Other Ambulatory Visit: Payer: Self-pay

## 2016-05-31 DIAGNOSIS — R748 Abnormal levels of other serum enzymes: Secondary | ICD-10-CM

## 2016-05-31 NOTE — Telephone Encounter (Signed)
Pt notified I also stated I scheduled her whole body bone scan she states would like to discuss with you on her appt on Friday.  Scan setup on Aug 3 at [place] arrival time @ 9:15

## 2016-06-02 ENCOUNTER — Encounter: Payer: Self-pay | Admitting: Family Medicine

## 2016-06-02 ENCOUNTER — Ambulatory Visit (INDEPENDENT_AMBULATORY_CARE_PROVIDER_SITE_OTHER): Payer: Medicare Other | Admitting: Family Medicine

## 2016-06-02 ENCOUNTER — Other Ambulatory Visit: Payer: Self-pay | Admitting: Family Medicine

## 2016-06-02 DIAGNOSIS — M810 Age-related osteoporosis without current pathological fracture: Secondary | ICD-10-CM

## 2016-06-02 DIAGNOSIS — M19041 Primary osteoarthritis, right hand: Secondary | ICD-10-CM

## 2016-06-02 DIAGNOSIS — I10 Essential (primary) hypertension: Secondary | ICD-10-CM | POA: Diagnosis not present

## 2016-06-02 DIAGNOSIS — R748 Abnormal levels of other serum enzymes: Secondary | ICD-10-CM

## 2016-06-02 DIAGNOSIS — M19042 Primary osteoarthritis, left hand: Secondary | ICD-10-CM

## 2016-06-02 DIAGNOSIS — G8929 Other chronic pain: Secondary | ICD-10-CM

## 2016-06-02 MED ORDER — HYDROCODONE-ACETAMINOPHEN 10-325 MG PO TABS: 0.5000 | ORAL_TABLET | Freq: Four times a day (QID) | ORAL | 0 refills | Status: DC | PRN

## 2016-06-02 NOTE — Progress Notes (Signed)
BP 116/82   Pulse 85   Temp 98.1 F (36.7 C) (Oral)   Resp 16   Wt 113 lb (51.3 kg)   SpO2 93%   BMI 20.02 kg/m   MD recheck of BP: 116/82  Subjective:    Patient ID: Samantha Dawson, female    DOB: [birth year], 82 y.o.   MRN: [id number]  HPI: Samantha Dawson is a 82 y.o. female  Chief Complaint  Patient presents with  . Follow-up   Patient is here for follow-up High alk phos noted 6 months ago; recheck recently and bone isoenzymes were elevated; patient cannot afford both the bone scan and the DEXA she says, so she was going to cancel the nuclear medicine bone scan (I urged her to do the nuclear med bone scan and put off the other if she could only do one right now) No night sweats Weight stable She continues to have pain, through her shoulders and down the spine  Chronic pain on narcotics; no bad constipation; not feeling loopy or drunk at all; she brought her bottle today; filled 05/03/16, 2.5 pills still present in bottle, yellow and scored with V on one side; we decreased her dose at last visit  Hypertension; BP today is low; she checks at pharmacy occasionally; she hasn't checked it in a few weeks; does not feel weak or dizzy necessarily; trying to limit salt in her diet  Relevant past medical, surgical, family and social history reviewed Past Medical History:  Diagnosis Date  . Anxiety   . Controlled substance agreement signed 03/22/2016  . 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 Encounter for chronic pain management   . Hyperlipidemia   . Hypertension   . Osteoarthritis   . Osteoporosis 03/22/2016  . Primary osteoarthritis of both hands 05/03/2016   Past Surgical History:  Procedure Laterality Date  . ABDOMINAL HYSTERECTOMY    . CATARACT EXTRACTION    . endaryerectomy    . renal stenting     Family History  Problem Relation Age of Onset  . Cancer Mother   . Coronary artery disease Father   . Cerebrovascular Accident Father    Social History    Substance Use Topics  . Smoking status: Current Every Day Smoker    Packs/day: 0.20    Types: Cigarettes  . Smokeless tobacco: Never Used  . Alcohol use No   Interim medical history since last visit reviewed. Allergies and medications reviewed  Review of Systems Per HPI unless specifically indicated above     Objective:    BP 116/82   Pulse 85   Temp 98.1 F (36.7 C) (Oral)   Resp 16   Wt 113 lb (51.3 kg)   SpO2 93%   BMI 20.02 kg/m   Wt Readings from Last 3 Encounters:  06/02/16 113 lb (51.3 kg)  05/03/16 113 lb 3.2 oz (51.3 kg)  03/22/16 115 lb 8 oz (52.4 kg)    Today's Vitals   06/02/16 1022 06/02/16 1044  BP: 98/62 116/82  Pulse: 85   Resp: 16   Temp: 98.1 F (36.7 C)   TempSrc: Oral   SpO2: 93%   Weight: 113 lb (51.3 kg)   MD note pain level: 3 to 4 out of 10 after taking pain pill  Physical Exam  Constitutional: She appears well-developed and well-nourished. No distress.  HENT:  Head: Normocephalic and atraumatic.  Eyes: EOM are normal. No scleral icterus.  Neck: No thyromegaly present.  Cardiovascular: Normal rate,  regular rhythm and normal heart sounds.   No murmur heard. Pulmonary/Chest: Effort normal and breath sounds normal. She has no wheezes.  Abdominal: Soft. She exhibits no distension.  Musculoskeletal: Normal range of motion. She exhibits no edema.       Thoracic back: She exhibits tenderness and deformity (kyphosis).       Right hand: She exhibits deformity (heberdens and bouchards nodes several fingers).       Left hand: She exhibits bony tenderness (tender over the 4th PIP LEFT hand) and deformity (heberdens and bouchards nodes several fingers).  Neurological: She is alert. She exhibits normal muscle tone.  Skin: Skin is warm and dry. She is not diaphoretic. No pallor.  Palmar erythema  Psychiatric: She has a normal mood and affect. Her behavior is normal. Judgment and thought content normal. Her mood appears not anxious. Cognition and  memory are not impaired. She does not exhibit a depressed mood.   Results for orders placed or performed in visit on 05/03/16  CBC with Differential/Platelet  Result Value Ref Range   WBC 6.4 3.8 - 10.8 K/uL   RBC 4.36 3.80 - 5.10 MIL/uL   Hemoglobin 14.1 11.7 - 15.5 g/dL   HCT 39.7 14.1 - 04.8 %   MCV 99.1 80.0 - 100.0 fL   MCH 32.3 27.0 - 33.0 pg   MCHC 32.6 32.0 - 36.0 g/dL   RDW 53.9 29.7 - 29.1 %   Platelets 173 140 - 400 K/uL   MPV 10.6 7.5 - 12.5 fL   Neutro Abs 3,200 1,500 - 7,800 cells/uL   Lymphs Abs 2,368 850 - 3,900 cells/uL   Monocytes Absolute 640 200 - 950 cells/uL   Eosinophils Absolute 128 15 - 500 cells/uL   Basophils Absolute 64 0 - 200 cells/uL   Neutrophils Relative % 50 %   Lymphocytes Relative 37 %   Monocytes Relative 10 %   Eosinophils Relative 2 %   Basophils Relative 1 %   Smear Review Criteria for review not met   Lipid panel  Result Value Ref Range   Cholesterol 121 (L) 125 - 200 mg/dL   Triglycerides 091 (H) <150 mg/dL   HDL 49 >=26 mg/dL   Total CHOL/HDL Ratio 2.5 <=5.0 Ratio   VLDL 41 (H) <30 mg/dL   LDL Cholesterol 31 <799 mg/dL  COMPLETE METABOLIC PANEL WITH GFR  Result Value Ref Range   Sodium 142 135 - 146 mmol/L   Potassium 4.6 3.5 - 5.3 mmol/L   Chloride 105 98 - 110 mmol/L   CO2 25 20 - 31 mmol/L   Glucose, Bld 105 (H) 65 - 99 mg/dL   BUN 17 7 - 25 mg/dL   Creat 6.85 3.14 - 0.82 mg/dL   Total Bilirubin 0.7 0.2 - 1.2 mg/dL   Alkaline Phosphatase 128 33 - 130 U/L   AST 18 10 - 35 U/L   ALT 9 6 - 29 U/L   Total Protein 7.2 6.1 - 8.1 g/dL   Albumin 4.4 3.6 - 5.1 g/dL   Calcium 9.6 8.6 - 43.2 mg/dL   GFR, Est African American 76 >=60 mL/min   GFR, Est Non African American 66 >=60 mL/min  Vitamin B12  Result Value Ref Range   Vitamin B-12 413 200 - 1,100 pg/mL  VITAMIN D 25 Hydroxy (Vit-D Deficiency, Fractures)  Result Value Ref Range   Vit D, 25-Hydroxy 37 30 - 100 ng/mL  Alkaline Phosphatase Isoenzymes  Result Value Ref  Range   Alkaline Phonsphatase  129 33 - 130 U/L   Intestinal Isoenzymes 0 (L) 1 - 24 %   Bone Isoenzymes 86 (H) 28 - 66 %   Liver Isoenzymes 14 (L) 25 - 69 %   Macrohepatic isoenzymes 0 0 %      Assessment & Plan:   Problem List Items Addressed This Visit      Cardiovascular and Mediastinum   Essential hypertension    Low on initial check-in; I rechecked it personally with regular adult size cuff and patient says that's more like it usually runs; she denies feeling dizzy or light-headed; patient thinks first BP reading might not be accurate        Musculoskeletal and Integument   Primary osteoarthritis of both hands    With heberdens and bouchards nodes noted      Relevant Medications   HYDROcodone-acetaminophen (NORCO) 10-325 MG tablet   HYDROcodone-acetaminophen (NORCO) 10-325 MG tablet   Osteoporosis    Patient says she cannot get both scans, the DEXA and the nuclear med bone scan, so I suggested she delay the DEXA scan for now just temporarily; fall precautions, 3 servings of calcium daily        Other   Elevated serum alkaline phosphatase level    Urged pt to get the nuclear med bone scan to see if anything eating away at her bones, such as malignancy, bone disease; she agrees to have this done      Chronic pain    Patient came to me on chronic narcotics; she has complied with UDS, pill counts; she did allow me to taper her total monthly pill count down last time; will continue current regimen, 3 months of pain medicine prescriptions provided today with appropriate fill on or after dates; [place] website consulted; no other prescribed, no red flags; return in 3 months for medication, management and f/u      Relevant Medications   HYDROcodone-acetaminophen (NORCO) 10-325 MG tablet   HYDROcodone-acetaminophen (NORCO) 10-325 MG tablet    Other Visit Diagnoses   None.      Follow up plan: Return in about 3 months (around 08/29/2016), or or Oct 25th, for 3 month  follow-up, medicine management.  An after-visit summary was printed and given to the patient at [facility].  Please see the patient instructions which may contain other information and recommendations beyond what is mentioned above in the assessment and plan.  Meds ordered this encounter  Medications  . DISCONTD: HYDROcodone-acetaminophen (NORCO) 10-325 MG tablet    Sig: Take 0.5-1 tablets by mouth every 6 (six) hours as needed.    Dispense:  105 tablet    Refill:  0    105 pills to last 30 days; may fill today  . DISCONTD: HYDROcodone-acetaminophen (NORCO) 10-325 MG tablet    Sig: Take 0.5-1 tablets by mouth every 6 (six) hours as needed.    Dispense:  105 tablet    Refill:  0    105 pills to last 30 days; may fill on or after July 02, 2016  . HYDROcodone-acetaminophen (NORCO) 10-325 MG tablet    Sig: Take 0.5-1 tablets by mouth every 6 (six) hours as needed.    Dispense:  105 tablet    Refill:  0    105 pills to last 30 days; may fill on or after Sept 26, 2017  . HYDROcodone-acetaminophen (NORCO) 10-325 MG tablet    Sig: Take 0.5-1 tablets by mouth every 6 (six) hours as needed.    Dispense:  105 tablet  Refill:  0    105 pills to last 30 days; may fill on or after July 02, 2016   No orders of the defined types were placed in this encounter.

## 2016-06-02 NOTE — Patient Instructions (Addendum)
If you cannot afford both bone scans at this time, I would suggest that you postpone the DEXA scan (bone DENSITY scan) and get the nuclear medicine bone scan to see if there is any cancer or anything else worrisome eating away at your bones If you want to cancel the July 31st DEXA scan, we can postpone that Return in 3 months

## 2016-06-04 NOTE — Assessment & Plan Note (Signed)
With heberdens and bouchards nodes noted

## 2016-06-04 NOTE — Assessment & Plan Note (Signed)
Low on initial check-in; I rechecked it personally with regular adult size cuff and patient says that's more like it usually runs; she denies feeling dizzy or light-headed; patient thinks first BP reading might not be accurate

## 2016-06-04 NOTE — Assessment & Plan Note (Signed)
Patient came to me on chronic narcotics; she has complied with UDS, pill counts; she did allow me to taper her total monthly pill count down last time; will continue current regimen, 3 months of pain medicine prescriptions provided today with appropriate fill on or after dates; [place] website consulted; no other prescribed, no red flags; return in 3 months for medication, management and f/u

## 2016-06-04 NOTE — Assessment & Plan Note (Signed)
Patient says she cannot get both scans, the DEXA and the nuclear med bone scan, so I suggested she delay the DEXA scan for now just temporarily; fall precautions, 3 servings of calcium daily

## 2016-06-04 NOTE — Assessment & Plan Note (Signed)
Urged pt to get the nuclear med bone scan to see if anything eating away at her bones, such as malignancy, bone disease; she agrees to have this done

## 2016-06-05 ENCOUNTER — Ambulatory Visit: Payer: Medicare Other

## 2016-06-08 ENCOUNTER — Ambulatory Visit
Admission: RE | Admit: 2016-06-08 | Discharge: 2016-06-08 | Disposition: A | Discharge status: Home / Self Care | Payer: Medicare Other | Source: Ambulatory Visit | Attending: Family Medicine | Admitting: Family Medicine

## 2016-06-08 ENCOUNTER — Encounter
Admission: RE | Admit: 2016-06-08 | Discharge: 2016-06-08 | Disposition: A | Discharge status: Home / Self Care | Payer: Medicare Other | Source: Ambulatory Visit | Attending: Family Medicine | Admitting: Family Medicine

## 2016-06-08 DIAGNOSIS — R938 Abnormal findings on diagnostic imaging of other specified body structures: Secondary | ICD-10-CM | POA: Diagnosis not present

## 2016-06-08 DIAGNOSIS — M5136 Other intervertebral disc degeneration, lumbar region: Secondary | ICD-10-CM | POA: Diagnosis not present

## 2016-06-08 DIAGNOSIS — R748 Abnormal levels of other serum enzymes: Secondary | ICD-10-CM

## 2016-06-08 MED ORDER — TECHNETIUM TC 99M MEDRONATE IV KIT
24.0700 | PACK | Freq: Once | INTRAVENOUS | Status: AC | PRN
  Administered 2016-06-08: 24.07 via INTRAVENOUS

## 2016-06-12 ENCOUNTER — Other Ambulatory Visit: Payer: Self-pay

## 2016-06-13 MED ORDER — CLOPIDOGREL BISULFATE 75 MG PO TABS: 75.0000 mg | ORAL_TABLET | Freq: Every day | ORAL | 5 refills | Status: DC

## 2016-06-13 NOTE — Telephone Encounter (Signed)
Normal cbc June 2017; Rx approved

## 2016-06-18 ENCOUNTER — Other Ambulatory Visit: Payer: Self-pay | Admitting: Family Medicine

## 2016-06-18 DIAGNOSIS — R948 Abnormal results of function studies of other organs and systems: Secondary | ICD-10-CM | POA: Insufficient documentation

## 2016-06-18 DIAGNOSIS — R937 Abnormal findings on diagnostic imaging of other parts of musculoskeletal system: Secondary | ICD-10-CM | POA: Insufficient documentation

## 2016-06-18 NOTE — Progress Notes (Signed)
I talked with pt, will get MRI with contrast of C-spine

## 2016-06-18 NOTE — Assessment & Plan Note (Signed)
Will get MRI of C-spine

## 2016-06-20 NOTE — Telephone Encounter (Signed)
COMPLETED

## 2016-06-22 ENCOUNTER — Other Ambulatory Visit: Payer: Self-pay

## 2016-06-22 DIAGNOSIS — R937 Abnormal findings on diagnostic imaging of other parts of musculoskeletal system: Secondary | ICD-10-CM

## 2016-06-25 NOTE — Progress Notes (Signed)
noted 

## 2016-06-29 NOTE — Assessment & Plan Note (Signed)
With heberdens and bouchards nodes; significant arthritis elsewhere; on pain medicine

## 2016-06-29 NOTE — Assessment & Plan Note (Signed)
Recheck; on ARB; avoid salt substitutes

## 2016-06-29 NOTE — Assessment & Plan Note (Signed)
I inherited this patient on narcotics; we have controlled substance agreement signed; UDS done; I will plan to continue her on current regimen for now

## 2016-07-03 ENCOUNTER — Ambulatory Visit
Admission: RE | Admit: 2016-07-03 | Discharge: 2016-07-03 | Disposition: A | Discharge status: Home / Self Care | Payer: Medicare Other | Source: Ambulatory Visit | Attending: Family Medicine | Admitting: Family Medicine

## 2016-07-03 ENCOUNTER — Other Ambulatory Visit: Payer: Self-pay | Admitting: Family Medicine

## 2016-07-03 DIAGNOSIS — R937 Abnormal findings on diagnostic imaging of other parts of musculoskeletal system: Secondary | ICD-10-CM | POA: Diagnosis not present

## 2016-07-03 DIAGNOSIS — M47812 Spondylosis without myelopathy or radiculopathy, cervical region: Secondary | ICD-10-CM | POA: Insufficient documentation

## 2016-07-03 MED ORDER — GADOBENATE DIMEGLUMINE 529 MG/ML IV SOLN
10.0000 mL | Freq: Once | INTRAVENOUS | Status: AC | PRN
  Administered 2016-07-03: 10 mL via INTRAVENOUS

## 2016-07-04 ENCOUNTER — Other Ambulatory Visit: Payer: Self-pay

## 2016-07-04 MED ORDER — TIOTROPIUM BROMIDE MONOHYDRATE 18 MCG IN CAPS: 18.0000 ug | ORAL_CAPSULE | Freq: Every day | RESPIRATORY_TRACT | 11 refills | Status: DC

## 2016-07-13 ENCOUNTER — Other Ambulatory Visit: Payer: Self-pay | Admitting: Family Medicine

## 2016-07-13 DIAGNOSIS — E041 Nontoxic single thyroid nodule: Secondary | ICD-10-CM | POA: Insufficient documentation

## 2016-07-13 NOTE — Telephone Encounter (Signed)
Rx sent I talked with patient about scan results She has felt a little jittery and we reviewed weights, down 3 pounds over last year Will get thyroid US, thyroid scan Discussed MRI findings; offered referral to pain clinic, consider injections; she'll process all of this and call me back next week

## 2016-07-13 NOTE — Assessment & Plan Note (Signed)
Discussed with pt by phone; she does feel jittery, has lost 3 pounds over the last year; will get [place], nuclear med uptake scan

## 2016-07-14 ENCOUNTER — Other Ambulatory Visit: Payer: Self-pay

## 2016-07-17 ENCOUNTER — Other Ambulatory Visit: Payer: Self-pay

## 2016-07-17 ENCOUNTER — Telehealth: Payer: Self-pay

## 2016-07-17 DIAGNOSIS — E041 Nontoxic single thyroid nodule: Secondary | ICD-10-CM

## 2016-07-17 NOTE — Telephone Encounter (Signed)
Noted thank you

## 2016-07-17 NOTE — Telephone Encounter (Signed)
Pt was told to let u know that she wants to currently stay on the pain medication with you for right now.

## 2016-07-26 DIAGNOSIS — H353131 Nonexudative age-related macular degeneration, bilateral, early dry stage: Secondary | ICD-10-CM | POA: Diagnosis not present

## 2016-07-31 ENCOUNTER — Encounter: Admission: RE | Admit: 2016-07-31 | Payer: Medicare Other | Source: Ambulatory Visit

## 2016-07-31 ENCOUNTER — Ambulatory Visit
Admission: RE | Admit: 2016-07-31 | Discharge: 2016-07-31 | Disposition: A | Discharge status: Home / Self Care | Payer: Medicare Other | Source: Ambulatory Visit | Attending: Family Medicine | Admitting: Family Medicine

## 2016-07-31 DIAGNOSIS — E042 Nontoxic multinodular goiter: Secondary | ICD-10-CM | POA: Insufficient documentation

## 2016-07-31 DIAGNOSIS — E041 Nontoxic single thyroid nodule: Secondary | ICD-10-CM | POA: Diagnosis present

## 2016-08-01 ENCOUNTER — Other Ambulatory Visit: Payer: Medicare Other

## 2016-08-01 ENCOUNTER — Ambulatory Visit
Admission: RE | Admit: 2016-08-01 | Discharge: 2016-08-01 | Disposition: A | Discharge status: Home / Self Care | Payer: Medicare Other | Source: Ambulatory Visit | Attending: Family Medicine | Admitting: Family Medicine

## 2016-08-01 ENCOUNTER — Inpatient Hospital Stay: Admission: RE | Admit: 2016-08-01 | Payer: Medicare Other | Source: Ambulatory Visit

## 2016-08-01 ENCOUNTER — Encounter: Admission: RE | Admit: 2016-08-01 | Payer: Medicare Other | Source: Ambulatory Visit

## 2016-08-01 DIAGNOSIS — E041 Nontoxic single thyroid nodule: Secondary | ICD-10-CM

## 2016-08-01 DIAGNOSIS — E042 Nontoxic multinodular goiter: Secondary | ICD-10-CM | POA: Insufficient documentation

## 2016-08-01 MED ORDER — SODIUM IODIDE I-123 7.4 MBQ CAPS
200.0000 | ORAL_CAPSULE | Freq: Once | ORAL | Status: AC
  Administered 2016-08-01: 145.5 via ORAL

## 2016-08-02 ENCOUNTER — Other Ambulatory Visit: Payer: Self-pay | Admitting: Family Medicine

## 2016-08-02 ENCOUNTER — Encounter
Admission: RE | Admit: 2016-08-02 | Discharge: 2016-08-02 | Disposition: A | Discharge status: Home / Self Care | Payer: Medicare Other | Source: Ambulatory Visit | Attending: Family Medicine | Admitting: Family Medicine

## 2016-08-02 DIAGNOSIS — E041 Nontoxic single thyroid nodule: Secondary | ICD-10-CM

## 2016-08-02 DIAGNOSIS — E042 Nontoxic multinodular goiter: Secondary | ICD-10-CM | POA: Diagnosis not present

## 2016-08-02 NOTE — Assessment & Plan Note (Signed)
Refer to ENT for ongoing evaluation, follow-up, and management

## 2016-08-02 NOTE — Progress Notes (Signed)
Refer to ENT for ongoing follow-up, any management of 2 cm thyroid nodule

## 2016-08-07 ENCOUNTER — Telehealth: Payer: Self-pay

## 2016-08-07 NOTE — Telephone Encounter (Signed)
Tried to contact this patient to inform her that she has an appt on Thursday, August 17, 2016 at 3:00pm with Dr. Richardson Landry at [facility] on [address] in [place], but there was no answer and I was not able to leave a message.

## 2016-08-11 ENCOUNTER — Other Ambulatory Visit: Payer: Self-pay | Admitting: Family Medicine

## 2016-08-11 NOTE — Telephone Encounter (Signed)
PT SAYS THAT DR MORRISEY HAS HER ON PROVENTIL HFA 4 PUFFS A DAY THE INHALER IS ONLY LASTING 1/2 THE MONTH  AND HER INSURANCCE JUST PAYS FOR 3 INHALERS EVERY OTHER MONTH / PLEASE SNED IN ANOTHER REFILL AND THE PT HAS APPT ON THE 25TH.

## 2016-08-14 NOTE — Telephone Encounter (Signed)
I contacted this patient to make sure she knew about her appointment with [facility], and she stated that someone for their office called her and that she will be there this week on the 12th.

## 2016-08-17 DIAGNOSIS — E041 Nontoxic single thyroid nodule: Secondary | ICD-10-CM | POA: Diagnosis not present

## 2016-08-29 ENCOUNTER — Encounter: Payer: Self-pay | Admitting: Family Medicine

## 2016-08-29 ENCOUNTER — Ambulatory Visit (INDEPENDENT_AMBULATORY_CARE_PROVIDER_SITE_OTHER): Payer: Medicare Other | Admitting: Family Medicine

## 2016-08-29 VITALS — BP 118/80 | HR 93 | Temp 98.0°F | Resp 16 | Wt 113.0 lb

## 2016-08-29 DIAGNOSIS — Z79899 Other long term (current) drug therapy: Secondary | ICD-10-CM

## 2016-08-29 DIAGNOSIS — Z5181 Encounter for therapeutic drug level monitoring: Secondary | ICD-10-CM | POA: Diagnosis not present

## 2016-08-29 DIAGNOSIS — E782 Mixed hyperlipidemia: Secondary | ICD-10-CM

## 2016-08-29 DIAGNOSIS — Z23 Encounter for immunization: Secondary | ICD-10-CM

## 2016-08-29 DIAGNOSIS — R748 Abnormal levels of other serum enzymes: Secondary | ICD-10-CM | POA: Diagnosis not present

## 2016-08-29 DIAGNOSIS — G8929 Other chronic pain: Secondary | ICD-10-CM

## 2016-08-29 DIAGNOSIS — Z720 Tobacco use: Secondary | ICD-10-CM

## 2016-08-29 DIAGNOSIS — J431 Panlobular emphysema: Secondary | ICD-10-CM

## 2016-08-29 DIAGNOSIS — I10 Essential (primary) hypertension: Secondary | ICD-10-CM | POA: Diagnosis not present

## 2016-08-29 DIAGNOSIS — E041 Nontoxic single thyroid nodule: Secondary | ICD-10-CM | POA: Diagnosis not present

## 2016-08-29 LAB — COMPLETE METABOLIC PANEL WITH GFR
ALT: 9 U/L (ref 6–29)
AST: 18 U/L (ref 10–35)
Albumin: 4.2 g/dL (ref 3.6–5.1)
Alkaline Phosphatase: 107 U/L (ref 33–130)
BUN: 17 mg/dL (ref 7–25)
CHLORIDE: 106 mmol/L (ref 98–110)
CO2: 28 mmol/L (ref 20–31)
Calcium: 9.4 mg/dL (ref 8.6–10.4)
Creat: 0.99 mg/dL — ABNORMAL HIGH (ref 0.60–0.88)
GFR, EST AFRICAN AMERICAN: 61 mL/min (ref >=60)
GFR, EST NON AFRICAN AMERICAN: 52 mL/min — AB (ref >=60)
Glucose, Bld: 95 mg/dL (ref 65–99)
POTASSIUM: 5.2 mmol/L (ref 3.5–5.3)
Sodium: 143 mmol/L (ref 135–146)
Total Bilirubin: 0.9 mg/dL (ref 0.2–1.2)
Total Protein: 7 g/dL (ref 6.1–8.1)

## 2016-08-29 LAB — LIPID PANEL
CHOL/HDL RATIO: 3 ratio (ref <=5.0)
Cholesterol: 133 mg/dL (ref 125–200)
HDL: 45 mg/dL — AB (ref >=46)
LDL CALC: 39 mg/dL (ref <130)
TRIGLYCERIDES: 244 mg/dL — AB (ref <150)
VLDL: 49 mg/dL — AB (ref <30)

## 2016-08-29 LAB — CBC WITH DIFFERENTIAL/PLATELET
BASOS ABS: 0 {cells}/uL (ref 0–200)
Basophils Relative: 0 %
EOS PCT: 1 %
Eosinophils Absolute: 89 cells/uL (ref 15–500)
HCT: 44.7 % (ref 35.0–45.0)
HEMOGLOBIN: 14.9 g/dL (ref 11.7–15.5)
LYMPHS ABS: 2225 {cells}/uL (ref 850–3900)
Lymphocytes Relative: 25 %
MCH: 33 pg (ref 27.0–33.0)
MCHC: 33.3 g/dL (ref 32.0–36.0)
MCV: 99.1 fL (ref 80.0–100.0)
MONOS PCT: 10 %
MPV: 10.5 fL (ref 7.5–12.5)
Monocytes Absolute: 890 cells/uL (ref 200–950)
NEUTROS PCT: 64 %
Neutro Abs: 5696 cells/uL (ref 1500–7800)
Platelets: 177 10*3/uL (ref 140–400)
RBC: 4.51 MIL/uL (ref 3.80–5.10)
RDW: 13.7 % (ref 11.0–15.0)
WBC: 8.9 10*3/uL (ref 3.8–10.8)

## 2016-08-29 LAB — TSH: TSH: 1.15 m[IU]/L

## 2016-08-29 MED ORDER — GLYCOPYRROLATE-FORMOTEROL 9-4.8 MCG/ACT IN AERO: 2.0000 | INHALATION_SPRAY | Freq: Two times a day (BID) | RESPIRATORY_TRACT | 0 refills | Status: DC

## 2016-08-29 MED ORDER — HYDROCODONE-ACETAMINOPHEN 10-325 MG PO TABS: 0.5000 | ORAL_TABLET | Freq: Four times a day (QID) | ORAL | 0 refills | Status: DC | PRN

## 2016-08-29 MED ORDER — ALBUTEROL SULFATE HFA 108 (90 BASE) MCG/ACT IN AERS: 2.0000 | INHALATION_SPRAY | Freq: Four times a day (QID) | RESPIRATORY_TRACT | 3 refills | Status: DC | PRN

## 2016-08-29 NOTE — Assessment & Plan Note (Signed)
Well controlled 

## 2016-08-29 NOTE — Assessment & Plan Note (Signed)
See AVS

## 2016-08-29 NOTE — Assessment & Plan Note (Signed)
Check lipids; avoid sat fats

## 2016-08-29 NOTE — Assessment & Plan Note (Signed)
Check labs 

## 2016-08-29 NOTE — Assessment & Plan Note (Signed)
Followed by ENT; asymptomatic; patient will see Dr. Richardson Landry next October

## 2016-08-29 NOTE — Assessment & Plan Note (Signed)
Not controlled; stop spiriva; start Bevespi

## 2016-08-29 NOTE — Patient Instructions (Signed)
We'll get labs today If you have not heard anything from my staff in a week about any orders/referrals/studies from today, please contact us here to follow-up [phone] I do encourage you to quit smoking Call [phone] to sign up for smoking cessation classes You can call 1-800-QUIT-NOW to talk with a smoking cessation coach STOP the Spiriva Try the new inhaler, Bevespi; if it works well for you, let us know and we'll send in refills Try to limit saturated fats in your diet (bologna, hot dogs, barbeque, cheeseburgers, hamburgers, steak, bacon, sausage, cheese, etc.) and get more fresh fruits, vegetables, and whole grains

## 2016-08-29 NOTE — Progress Notes (Signed)
BP 118/80   Pulse 93   Temp 98 F (36.7 C) (Oral)   Resp 16   Wt 113 lb (51.3 kg)   SpO2 93%   BMI 20.02 kg/m    Subjective:    Patient ID: Samantha Dawson, female    DOB: [birth year], 82 y.o.   MRN: [id number]  HPI: Samantha Dawson is a 82 y.o. female  Chief Complaint  Patient presents with  . Follow-up   Thyroid nodules on both sides of the neck; patient denies any trouble swallowing or hoarseness; saw ENT doctor and he will see her back next year; reviewed his note together  Hx of elev alk phos; had a bone scan done; same old same old hurting in lots of places, going on for years  On chronic pain medicine; she takes narcotics to help control her pain; brings the pain level down to the point where she can function and do chores; able to function and get about her daily life; medicine does not make her loopy or goofy or drunk; medicine does not make her overly constipated; gets fiber and water; no blood in the stool  Cervical MRI done in August 2017: IMPRESSION: 1. No abnormal marrow signal or enhancement. 2. Left thyroid nodule is incompletely imaged. This may be related to the abnormal uptake on the bone scan. Consider further evaluation with thyroid ultrasound. If patient is clinically hyperthyroid, consider nuclear medicine thyroid uptake and scan. 3. Moderate spondylosis in the cervical spine at C4-5 and C5-6 in particular.   Electronically Signed   By: San Morelle M.D.   On: 07/03/2016 16:25  High cholesterol; reviewed last lipids; LDL 31 with statin; no bad muscle aches that she can tell; her pain is mostly from arthritis; tries to eat a good diet  Energy level for an 82 year old is pretty good; she does not climb ladders anymore; can do ordinary everyday things  She has emphysema; using two puffs every six hours, breathing worse when muggy;    Depression screen [facility] 2/9 08/29/2016 05/03/2016 10/25/2015 05/25/2015  Decreased Interest 0 0 0 0  Down,  Depressed, Hopeless 0 0 0 0  PHQ - 2 Score 0 0 0 0   Relevant past medical, surgical, family and social history reviewed Past Medical History:  Diagnosis Date  . Anxiety   . Controlled substance agreement signed 03/22/2016  . 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 Encounter for chronic pain management   . Hyperlipidemia   . Hypertension   . Osteoarthritis   . Osteoporosis 03/22/2016  . Primary osteoarthritis of both hands 05/03/2016   Past Surgical History:  Procedure Laterality Date  . ABDOMINAL HYSTERECTOMY    . CATARACT EXTRACTION    . endaryerectomy    . renal stenting     Family History  Problem Relation Age of Onset  . Cancer Mother   . Coronary artery disease Father   . Cerebrovascular Accident Father    Social History  Substance Use Topics  . Smoking status: Current Every Day Smoker    Packs/day: 0.20    Types: Cigarettes  . Smokeless tobacco: Never Used  . Alcohol use No    Interim medical history since last visit reviewed. Allergies and medications reviewed  Review of Systems Per HPI unless specifically indicated above     Objective:    BP 118/80   Pulse 93   Temp 98 F (36.7 C) (Oral)   Resp 16   Wt 113 lb (  51.3 kg)   SpO2 93%   BMI 20.02 kg/m   Wt Readings from Last 3 Encounters:  08/29/16 113 lb (51.3 kg)  06/02/16 113 lb (51.3 kg)  05/03/16 113 lb 3.2 oz (51.3 kg)    Physical Exam  Constitutional: She appears well-developed and well-nourished. No distress.  HENT:  Head: Normocephalic and atraumatic.  Eyes: EOM are normal. No scleral icterus.  Neck: No thyromegaly present.  Cardiovascular: Normal rate, regular rhythm and normal heart sounds.   No murmur heard. Pulmonary/Chest: Effort normal and breath sounds normal. She has no wheezes.  Abdominal: Soft. She exhibits no distension.  Musculoskeletal: Normal range of motion. She exhibits no edema.       Thoracic back: She exhibits tenderness and deformity (kyphosis).        Right hand: She exhibits deformity (heberdens and bouchards nodes several fingers).       Left hand: She exhibits bony tenderness (tender over the 4th PIP LEFT hand) and deformity (heberdens and bouchards nodes several fingers).  Neurological: She is alert. She exhibits normal muscle tone.  Skin: Skin is warm and dry. She is not diaphoretic. No pallor.  Palmar erythema  Psychiatric: She has a normal mood and affect. Her behavior is normal. Judgment and thought content normal. Her mood appears not anxious. Cognition and memory are not impaired. She does not exhibit a depressed mood.   Results for orders placed or performed in visit on 05/03/16  CBC with Differential/Platelet  Result Value Ref Range   WBC 6.4 3.8 - 10.8 K/uL   RBC 4.36 3.80 - 5.10 MIL/uL   Hemoglobin 14.1 11.7 - 15.5 g/dL   HCT 43.2 35.0 - 45.0 %   MCV 99.1 80.0 - 100.0 fL   MCH 32.3 27.0 - 33.0 pg   MCHC 32.6 32.0 - 36.0 g/dL   RDW 13.3 11.0 - 15.0 %   Platelets 173 140 - 400 K/uL   MPV 10.6 7.5 - 12.5 fL   Neutro Abs 3,200 1,500 - 7,800 cells/uL   Lymphs Abs 2,368 850 - 3,900 cells/uL   Monocytes Absolute 640 200 - 950 cells/uL   Eosinophils Absolute 128 15 - 500 cells/uL   Basophils Absolute 64 0 - 200 cells/uL   Neutrophils Relative % 50 %   Lymphocytes Relative 37 %   Monocytes Relative 10 %   Eosinophils Relative 2 %   Basophils Relative 1 %   Smear Review Criteria for review not met   Lipid panel  Result Value Ref Range   Cholesterol 121 (L) 125 - 200 mg/dL   Triglycerides 203 (H) <150 mg/dL   HDL 49 >=46 mg/dL   Total CHOL/HDL Ratio 2.5 <=5.0 Ratio   VLDL 41 (H) <30 mg/dL   LDL Cholesterol 31 <130 mg/dL  COMPLETE METABOLIC PANEL WITH GFR  Result Value Ref Range   Sodium 142 135 - 146 mmol/L   Potassium 4.6 3.5 - 5.3 mmol/L   Chloride 105 98 - 110 mmol/L   CO2 25 20 - 31 mmol/L   Glucose, Bld 105 (H) 65 - 99 mg/dL   BUN 17 7 - 25 mg/dL   Creat 0.82 0.60 - 0.88 mg/dL   Total Bilirubin 0.7 0.2 - 1.2  mg/dL   Alkaline Phosphatase 128 33 - 130 U/L   AST 18 10 - 35 U/L   ALT 9 6 - 29 U/L   Total Protein 7.2 6.1 - 8.1 g/dL   Albumin 4.4 3.6 - 5.1 g/dL   Calcium  9.6 8.6 - 10.4 mg/dL   GFR, Est African American 76 >=60 mL/min   GFR, Est Non African American 66 >=60 mL/min  Vitamin B12  Result Value Ref Range   Vitamin B-12 413 200 - 1,100 pg/mL  VITAMIN D 25 Hydroxy (Vit-D Deficiency, Fractures)  Result Value Ref Range   Vit D, 25-Hydroxy 37 30 - 100 ng/mL  Alkaline Phosphatase Isoenzymes  Result Value Ref Range   Alkaline Phonsphatase 129 33 - 130 U/L   Intestinal Isoenzymes 0 (L) 1 - 24 %   Bone Isoenzymes 86 (H) 28 - 66 %   Liver Isoenzymes 14 (L) 25 - 69 %   Macrohepatic isoenzymes 0 0 %      Assessment & Plan:   Problem List Items Addressed This Visit      Cardiovascular and Mediastinum   Essential hypertension    Well-controlled        Respiratory   Panlobular emphysema (HCC)    Not controlled; stop spiriva; start Bevespi      Relevant Medications   albuterol (PROVENTIL HFA;VENTOLIN HFA) 108 (90 Base) MCG/ACT inhaler   Glycopyrrolate-Formoterol (BEVESPI AEROSPHERE) 9-4.8 MCG/ACT AERO     Endocrine   Left thyroid nodule    Followed by ENT; asymptomatic; patient will see Dr. Richardson Landry next October      Relevant Orders   TSH     Other   Tobacco abuse    See AVS      Medication monitoring encounter    Check labs      Relevant Orders   CBC with Differential/Platelet   Hyperlipidemia    Check lipids; avoid sat fats      Relevant Orders   Lipid panel   Elevated serum alkaline phosphatase level    Patient has had isoenzymes; total body scan; will recheck labs; discussed referral to endo if remaining elevated      Relevant Orders   COMPLETE METABOLIC PANEL WITH GFR   Alkaline Phosphatase Isoenzymes   Controlled substance agreement signed    Reviewed [place] web site prior to prescribing any medicine; last UDS consistent; 3 months of prescriptions  written out with appropriate fill on or after dates on them      Chronic pain    Refills provided of the hydrocodone; no adverse effects; helps her to do daily activities      Relevant Medications   HYDROcodone-acetaminophen (NORCO) 10-325 MG tablet   HYDROcodone-acetaminophen (NORCO) 10-325 MG tablet   HYDROcodone-acetaminophen (NORCO) 10-325 MG tablet    Other Visit Diagnoses    Needs flu shot    -  Primary   Relevant Orders   Flu vaccine HIGH DOSE PF (Fluzone High dose) (Completed)       Follow up plan: Return in about 3 months (around 11/29/2016) for medication management.  An after-visit summary was printed and given to the patient at [place].  Please see the patient instructions which may contain other information and recommendations beyond what is mentioned above in the assessment and plan.  Meds ordered this encounter  Medications  . albuterol (PROVENTIL HFA;VENTOLIN HFA) 108 (90 Base) MCG/ACT inhaler    Sig: Inhale 2 puffs into the lungs every 6 (six) hours as needed for wheezing or shortness of breath.    Dispense:  3 Inhaler    Refill:  3  . Glycopyrrolate-Formoterol (BEVESPI AEROSPHERE) 9-4.8 MCG/ACT AERO    Sig: Inhale 2 puffs into the lungs 2 (two) times daily.    Dispense:  1 Inhaler  Refill:  0  . HYDROcodone-acetaminophen (NORCO) 10-325 MG tablet    Sig: Take 0.5-1 tablets by mouth every 6 (six) hours as needed.    Dispense:  105 tablet    Refill:  0    105 pills to last 30 days; may fill on or after Sep 01, 2016  . HYDROcodone-acetaminophen (NORCO) 10-325 MG tablet    Sig: Take 0.5-1 tablets by mouth every 6 (six) hours as needed.    Dispense:  105 tablet    Refill:  0    Fill on or after October 01, 2016  . HYDROcodone-acetaminophen (NORCO) 10-325 MG tablet    Sig: Take 0.5-1 tablets by mouth every 6 (six) hours as needed.    Dispense:  105 tablet    Refill:  0    Fill on or after October 31, 2016    Orders Placed This Encounter  Procedures    . Flu vaccine HIGH DOSE PF (Fluzone High dose)  . COMPLETE METABOLIC PANEL WITH GFR  . Lipid panel  . CBC with Differential/Platelet  . Alkaline Phosphatase Isoenzymes  . TSH

## 2016-08-29 NOTE — Assessment & Plan Note (Signed)
Patient has had isoenzymes; total body scan; will recheck labs; discussed referral to endo if remaining elevated

## 2016-08-29 NOTE — Assessment & Plan Note (Signed)
Refills provided of the hydrocodone; no adverse effects; helps her to do daily activities

## 2016-08-29 NOTE — Assessment & Plan Note (Signed)
Reviewed [place] web site prior to prescribing any medicine; last UDS consistent; 3 months of prescriptions written out with appropriate fill on or after dates on them

## 2016-09-02 ENCOUNTER — Emergency Department: Payer: Medicare Other

## 2016-09-02 ENCOUNTER — Inpatient Hospital Stay
Admission: EM | Admit: 2016-09-02 | Discharge: 2016-09-05 | DRG: 871 | Disposition: A | Discharge status: Home / Self Care | Payer: Medicare Other | Attending: Internal Medicine | Admitting: Internal Medicine

## 2016-09-02 ENCOUNTER — Encounter: Payer: Self-pay | Admitting: Emergency Medicine

## 2016-09-02 DIAGNOSIS — M810 Age-related osteoporosis without current pathological fracture: Secondary | ICD-10-CM | POA: Diagnosis present

## 2016-09-02 DIAGNOSIS — R0602 Shortness of breath: Secondary | ICD-10-CM | POA: Diagnosis not present

## 2016-09-02 DIAGNOSIS — Z7902 Long term (current) use of antithrombotics/antiplatelets: Secondary | ICD-10-CM | POA: Diagnosis not present

## 2016-09-02 DIAGNOSIS — R05 Cough: Secondary | ICD-10-CM | POA: Diagnosis not present

## 2016-09-02 DIAGNOSIS — I248 Other forms of acute ischemic heart disease: Secondary | ICD-10-CM | POA: Diagnosis not present

## 2016-09-02 DIAGNOSIS — E782 Mixed hyperlipidemia: Secondary | ICD-10-CM | POA: Diagnosis not present

## 2016-09-02 DIAGNOSIS — R739 Hyperglycemia, unspecified: Secondary | ICD-10-CM

## 2016-09-02 DIAGNOSIS — J9601 Acute respiratory failure with hypoxia: Secondary | ICD-10-CM | POA: Diagnosis not present

## 2016-09-02 DIAGNOSIS — J189 Pneumonia, unspecified organism: Secondary | ICD-10-CM | POA: Diagnosis not present

## 2016-09-02 DIAGNOSIS — K219 Gastro-esophageal reflux disease without esophagitis: Secondary | ICD-10-CM | POA: Diagnosis not present

## 2016-09-02 DIAGNOSIS — F17210 Nicotine dependence, cigarettes, uncomplicated: Secondary | ICD-10-CM | POA: Diagnosis present

## 2016-09-02 DIAGNOSIS — A419 Sepsis, unspecified organism: Principal | ICD-10-CM

## 2016-09-02 DIAGNOSIS — F419 Anxiety disorder, unspecified: Secondary | ICD-10-CM | POA: Diagnosis present

## 2016-09-02 DIAGNOSIS — I739 Peripheral vascular disease, unspecified: Secondary | ICD-10-CM | POA: Diagnosis present

## 2016-09-02 DIAGNOSIS — J441 Chronic obstructive pulmonary disease with (acute) exacerbation: Secondary | ICD-10-CM | POA: Diagnosis present

## 2016-09-02 DIAGNOSIS — Z66 Do not resuscitate: Secondary | ICD-10-CM | POA: Diagnosis not present

## 2016-09-02 DIAGNOSIS — I208 Other forms of angina pectoris: Secondary | ICD-10-CM | POA: Diagnosis not present

## 2016-09-02 DIAGNOSIS — M19041 Primary osteoarthritis, right hand: Secondary | ICD-10-CM | POA: Diagnosis present

## 2016-09-02 DIAGNOSIS — Z23 Encounter for immunization: Secondary | ICD-10-CM

## 2016-09-02 DIAGNOSIS — Z8249 Family history of ischemic heart disease and other diseases of the circulatory system: Secondary | ICD-10-CM

## 2016-09-02 DIAGNOSIS — T380X5A Adverse effect of glucocorticoids and synthetic analogues, initial encounter: Secondary | ICD-10-CM | POA: Diagnosis present

## 2016-09-02 DIAGNOSIS — Z7982 Long term (current) use of aspirin: Secondary | ICD-10-CM

## 2016-09-02 DIAGNOSIS — I10 Essential (primary) hypertension: Secondary | ICD-10-CM | POA: Diagnosis present

## 2016-09-02 DIAGNOSIS — Z9849 Cataract extraction status, unspecified eye: Secondary | ICD-10-CM

## 2016-09-02 DIAGNOSIS — J181 Lobar pneumonia, unspecified organism: Secondary | ICD-10-CM

## 2016-09-02 DIAGNOSIS — M19042 Primary osteoarthritis, left hand: Secondary | ICD-10-CM | POA: Diagnosis not present

## 2016-09-02 DIAGNOSIS — J431 Panlobular emphysema: Secondary | ICD-10-CM | POA: Diagnosis present

## 2016-09-02 DIAGNOSIS — J440 Chronic obstructive pulmonary disease with acute lower respiratory infection: Secondary | ICD-10-CM | POA: Diagnosis present

## 2016-09-02 DIAGNOSIS — J449 Chronic obstructive pulmonary disease, unspecified: Secondary | ICD-10-CM | POA: Diagnosis not present

## 2016-09-02 DIAGNOSIS — R7989 Other specified abnormal findings of blood chemistry: Secondary | ICD-10-CM | POA: Diagnosis not present

## 2016-09-02 DIAGNOSIS — R0689 Other abnormalities of breathing: Secondary | ICD-10-CM | POA: Diagnosis not present

## 2016-09-02 DIAGNOSIS — Z79899 Other long term (current) drug therapy: Secondary | ICD-10-CM | POA: Diagnosis not present

## 2016-09-02 LAB — CBC WITH DIFFERENTIAL/PLATELET
BASOS ABS: 0 10*3/uL (ref 0–0.1)
BASOS PCT: 0 %
Eosinophils Absolute: 0 10*3/uL (ref 0–0.7)
Eosinophils Relative: 0 %
HEMATOCRIT: 39.3 % (ref 35.0–47.0)
HEMOGLOBIN: 13.4 g/dL (ref 12.0–16.0)
LYMPHS PCT: 7 %
Lymphs Abs: 0.8 10*3/uL — ABNORMAL LOW (ref 1.0–3.6)
MCH: 33.4 pg (ref 26.0–34.0)
MCHC: 34.2 g/dL (ref 32.0–36.0)
MCV: 97.7 fL (ref 80.0–100.0)
Monocytes Absolute: 2.1 10*3/uL — ABNORMAL HIGH (ref 0.2–0.9)
Monocytes Relative: 17 %
NEUTROS ABS: 9.2 10*3/uL — AB (ref 1.4–6.5)
NEUTROS PCT: 76 %
Platelets: 145 10*3/uL — ABNORMAL LOW (ref 150–440)
RBC: 4.02 MIL/uL (ref 3.80–5.20)
RDW: 13.7 % (ref 11.5–14.5)
WBC: 12.2 10*3/uL — ABNORMAL HIGH (ref 3.6–11.0)

## 2016-09-02 LAB — COMPREHENSIVE METABOLIC PANEL
ALT: 13 U/L — ABNORMAL LOW (ref 14–54)
AST: 34 U/L (ref 15–41)
Albumin: 3.5 g/dL (ref 3.5–5.0)
Alkaline Phosphatase: 92 U/L (ref 38–126)
Anion gap: 10 (ref 5–15)
BUN: 25 mg/dL — ABNORMAL HIGH (ref 6–20)
CHLORIDE: 101 mmol/L (ref 101–111)
CO2: 24 mmol/L (ref 22–32)
Calcium: 9 mg/dL (ref 8.9–10.3)
Creatinine, Ser: 0.91 mg/dL (ref 0.44–1.00)
GFR, EST NON AFRICAN AMERICAN: 56 mL/min — AB (ref >60)
Glucose, Bld: 161 mg/dL — ABNORMAL HIGH (ref 65–99)
POTASSIUM: 4.2 mmol/L (ref 3.5–5.1)
SODIUM: 135 mmol/L (ref 135–145)
Total Bilirubin: 1.1 mg/dL (ref 0.3–1.2)
Total Protein: 7.7 g/dL (ref 6.5–8.1)

## 2016-09-02 LAB — INFLUENZA PANEL BY PCR (TYPE A & B)
H1N1 flu by pcr: NOT DETECTED
INFLAPCR: NEGATIVE
Influenza B By PCR: NEGATIVE

## 2016-09-02 LAB — URINALYSIS COMPLETE WITH MICROSCOPIC (ARMC ONLY)
Bilirubin Urine: NEGATIVE
GLUCOSE, UA: NEGATIVE mg/dL
Ketones, ur: NEGATIVE mg/dL
Leukocytes, UA: NEGATIVE
NITRITE: NEGATIVE
Protein, ur: 30 mg/dL — AB
Specific Gravity, Urine: 1.01 (ref 1.005–1.030)
pH: 6 (ref 5.0–8.0)

## 2016-09-02 LAB — TROPONIN I
Troponin I: 0.56 ng/mL (ref <0.03)
Troponin I: 0.52 ng/mL (ref <0.03)

## 2016-09-02 LAB — GLUCOSE, CAPILLARY: Glucose-Capillary: 136 mg/dL — ABNORMAL HIGH (ref 65–99)

## 2016-09-02 LAB — LACTIC ACID, PLASMA
LACTIC ACID, VENOUS: 1.1 mmol/L (ref 0.5–1.9)
LACTIC ACID, VENOUS: 1.4 mmol/L (ref 0.5–1.9)

## 2016-09-02 MED ORDER — ONDANSETRON HCL 4 MG/2ML IJ SOLN: 4.0000 mg | Freq: Four times a day (QID) | INTRAMUSCULAR | Status: DC | PRN

## 2016-09-02 MED ORDER — PANTOPRAZOLE SODIUM 40 MG PO TBEC
40.0000 mg | DELAYED_RELEASE_TABLET | Freq: Every day | ORAL | Status: DC
Start: 2016-09-02 — End: 2016-09-05
  Administered 2016-09-02 – 2016-09-04 (×3): 40 mg via ORAL
  Filled 2016-09-02 (×3): qty 1

## 2016-09-02 MED ORDER — HEPARIN SODIUM (PORCINE) 5000 UNIT/ML IJ SOLN
5000.0000 [IU] | Freq: Three times a day (TID) | INTRAMUSCULAR | Status: DC
  Administered 2016-09-02 – 2016-09-05 (×7): 5000 [IU] via SUBCUTANEOUS
  Filled 2016-09-02 (×7): qty 1

## 2016-09-02 MED ORDER — ROSUVASTATIN CALCIUM 5 MG PO TABS
5.0000 mg | ORAL_TABLET | Freq: Every day | ORAL | Status: DC
  Administered 2016-09-02 – 2016-09-04 (×3): 5 mg via ORAL
  Filled 2016-09-02 (×3): qty 1

## 2016-09-02 MED ORDER — SODIUM CHLORIDE 0.9 % IV BOLUS (SEPSIS)
1000.0000 mL | Freq: Once | INTRAVENOUS | Status: AC
  Administered 2016-09-02: 1000 mL via INTRAVENOUS

## 2016-09-02 MED ORDER — ACETAMINOPHEN 325 MG PO TABS: 650.0000 mg | ORAL_TABLET | Freq: Four times a day (QID) | ORAL | Status: DC | PRN

## 2016-09-02 MED ORDER — GUAIFENESIN ER 600 MG PO TB12
600.0000 mg | ORAL_TABLET | Freq: Two times a day (BID) | ORAL | Status: DC
  Administered 2016-09-02 – 2016-09-05 (×6): 600 mg via ORAL
  Filled 2016-09-02 (×6): qty 1

## 2016-09-02 MED ORDER — ALBUTEROL SULFATE (2.5 MG/3ML) 0.083% IN NEBU: 3.0000 mL | INHALATION_SOLUTION | Freq: Four times a day (QID) | RESPIRATORY_TRACT | Status: DC | PRN

## 2016-09-02 MED ORDER — SODIUM CHLORIDE 0.9 % IV BOLUS (SEPSIS)
500.0000 mL | Freq: Once | INTRAVENOUS | Status: AC
  Administered 2016-09-02: 500 mL via INTRAVENOUS

## 2016-09-02 MED ORDER — ACETAMINOPHEN 650 MG RE SUPP: 650.0000 mg | Freq: Four times a day (QID) | RECTAL | Status: DC | PRN

## 2016-09-02 MED ORDER — CEFTRIAXONE SODIUM 1 G IJ SOLR: 1.0000 g | Freq: Once | INTRAMUSCULAR | Status: DC

## 2016-09-02 MED ORDER — GUAIFENESIN-DM 100-10 MG/5ML PO SYRP
5.0000 mL | ORAL_SOLUTION | ORAL | Status: DC | PRN
  Administered 2016-09-02 – 2016-09-04 (×6): 5 mL via ORAL
  Filled 2016-09-02 (×6): qty 5

## 2016-09-02 MED ORDER — IRBESARTAN 150 MG PO TABS
150.0000 mg | ORAL_TABLET | Freq: Every day | ORAL | Status: DC
Start: 2016-09-02 — End: 2016-09-05
  Administered 2016-09-02 – 2016-09-05 (×4): 150 mg via ORAL
  Filled 2016-09-02 (×4): qty 1

## 2016-09-02 MED ORDER — DEXTROSE 5 % IV SOLN: 1.0000 g | INTRAVENOUS | Status: DC

## 2016-09-02 MED ORDER — IPRATROPIUM-ALBUTEROL 0.5-2.5 (3) MG/3ML IN SOLN
3.0000 mL | Freq: Four times a day (QID) | RESPIRATORY_TRACT | Status: DC
  Administered 2016-09-02 – 2016-09-04 (×8): 3 mL via RESPIRATORY_TRACT
  Filled 2016-09-02 (×7): qty 3

## 2016-09-02 MED ORDER — ZOLPIDEM TARTRATE 5 MG PO TABS
5.0000 mg | ORAL_TABLET | Freq: Every evening | ORAL | Status: DC | PRN
  Administered 2016-09-02 – 2016-09-04 (×3): 5 mg via ORAL
  Filled 2016-09-02 (×3): qty 1

## 2016-09-02 MED ORDER — SODIUM CHLORIDE 0.9 % IV SOLN
INTRAVENOUS | Status: DC
  Administered 2016-09-02 – 2016-09-04 (×4): via INTRAVENOUS

## 2016-09-02 MED ORDER — AZITHROMYCIN 500 MG PO TABS
500.0000 mg | ORAL_TABLET | Freq: Once | ORAL | Status: AC
  Administered 2016-09-02: 500 mg via ORAL
  Filled 2016-09-02: qty 1

## 2016-09-02 MED ORDER — CEFTRIAXONE SODIUM-DEXTROSE 1-3.74 GM-% IV SOLR
1.0000 g | INTRAVENOUS | Status: DC
  Administered 2016-09-03 – 2016-09-04 (×2): 1 g via INTRAVENOUS
  Filled 2016-09-02 (×2): qty 50

## 2016-09-02 MED ORDER — CLOPIDOGREL BISULFATE 75 MG PO TABS
75.0000 mg | ORAL_TABLET | Freq: Every day | ORAL | Status: DC
  Administered 2016-09-02 – 2016-09-05 (×4): 75 mg via ORAL
  Filled 2016-09-02 (×4): qty 1

## 2016-09-02 MED ORDER — HYDROCODONE-ACETAMINOPHEN 10-325 MG PO TABS
0.5000 | ORAL_TABLET | Freq: Four times a day (QID) | ORAL | Status: DC | PRN
  Administered 2016-09-03 – 2016-09-05 (×7): 1 via ORAL
  Filled 2016-09-02 (×7): qty 1

## 2016-09-02 MED ORDER — SODIUM CHLORIDE 0.9 % IV BOLUS (SEPSIS)
250.0000 mL | Freq: Once | INTRAVENOUS | Status: AC
  Administered 2016-09-02: 250 mL via INTRAVENOUS

## 2016-09-02 MED ORDER — ACETAMINOPHEN 500 MG PO TABS
1000.0000 mg | ORAL_TABLET | Freq: Once | ORAL | Status: DC
  Filled 2016-09-02: qty 2

## 2016-09-02 MED ORDER — HYDROCODONE-ACETAMINOPHEN 10-325 MG PO TABS: 0.5000 | ORAL_TABLET | Freq: Four times a day (QID) | ORAL | Status: DC | PRN

## 2016-09-02 MED ORDER — TIOTROPIUM BROMIDE MONOHYDRATE 18 MCG IN CAPS
18.0000 ug | ORAL_CAPSULE | Freq: Every day | RESPIRATORY_TRACT | Status: DC
  Administered 2016-09-04 – 2016-09-05 (×2): 18 ug via RESPIRATORY_TRACT
  Filled 2016-09-02: qty 5

## 2016-09-02 MED ORDER — IPRATROPIUM-ALBUTEROL 0.5-2.5 (3) MG/3ML IN SOLN
RESPIRATORY_TRACT | Status: AC
  Administered 2016-09-02: 3 mL via RESPIRATORY_TRACT
  Filled 2016-09-02: qty 3

## 2016-09-02 MED ORDER — MOMETASONE FURO-FORMOTEROL FUM 100-5 MCG/ACT IN AERO
2.0000 | INHALATION_SPRAY | Freq: Two times a day (BID) | RESPIRATORY_TRACT | Status: DC
  Administered 2016-09-02 – 2016-09-05 (×6): 2 via RESPIRATORY_TRACT
  Filled 2016-09-02: qty 8.8

## 2016-09-02 MED ORDER — CEFTRIAXONE SODIUM-DEXTROSE 1-3.74 GM-% IV SOLR
1.0000 g | Freq: Once | INTRAVENOUS | Status: AC
  Administered 2016-09-02: 1 g via INTRAVENOUS
  Filled 2016-09-02: qty 50

## 2016-09-02 MED ORDER — METHYLPREDNISOLONE SODIUM SUCC 125 MG IJ SOLR
INTRAMUSCULAR | Status: AC
  Administered 2016-09-02: 60 mg via INTRAVENOUS
  Filled 2016-09-02: qty 2

## 2016-09-02 MED ORDER — SODIUM CHLORIDE 0.9% FLUSH
3.0000 mL | Freq: Two times a day (BID) | INTRAVENOUS | Status: DC
  Administered 2016-09-02 – 2016-09-04 (×4): 3 mL via INTRAVENOUS

## 2016-09-02 MED ORDER — METHYLPREDNISOLONE SODIUM SUCC 125 MG IJ SOLR
60.0000 mg | Freq: Two times a day (BID) | INTRAMUSCULAR | Status: DC
  Administered 2016-09-02 – 2016-09-05 (×6): 60 mg via INTRAVENOUS
  Filled 2016-09-02 (×5): qty 2

## 2016-09-02 MED ORDER — DOCUSATE SODIUM 100 MG PO CAPS
100.0000 mg | ORAL_CAPSULE | Freq: Two times a day (BID) | ORAL | Status: DC
  Administered 2016-09-02 – 2016-09-04 (×4): 100 mg via ORAL
  Filled 2016-09-02 (×5): qty 1

## 2016-09-02 MED ORDER — BISACODYL 10 MG RE SUPP: 10.0000 mg | Freq: Every day | RECTAL | Status: DC | PRN

## 2016-09-02 MED ORDER — INSULIN ASPART 100 UNIT/ML ~~LOC~~ SOLN
0.0000 [IU] | Freq: Three times a day (TID) | SUBCUTANEOUS | Status: DC
  Administered 2016-09-02: 1 [IU] via SUBCUTANEOUS
  Administered 2016-09-03: 09:00:00 4 [IU] via SUBCUTANEOUS
  Administered 2016-09-03: 12:00:00 2 [IU] via SUBCUTANEOUS
  Administered 2016-09-03: 3 [IU] via SUBCUTANEOUS
  Administered 2016-09-04 (×3): 2 [IU] via SUBCUTANEOUS
  Administered 2016-09-05: 5 [IU] via SUBCUTANEOUS
  Administered 2016-09-05: 2 [IU] via SUBCUTANEOUS
  Filled 2016-09-02 (×5): qty 2
  Filled 2016-09-02: qty 5
  Filled 2016-09-02: qty 1
  Filled 2016-09-02 (×2): qty 2

## 2016-09-02 MED ORDER — ONDANSETRON HCL 4 MG PO TABS: 4.0000 mg | ORAL_TABLET | Freq: Four times a day (QID) | ORAL | Status: DC | PRN

## 2016-09-02 MED ORDER — ASPIRIN EC 81 MG PO TBEC
81.0000 mg | DELAYED_RELEASE_TABLET | Freq: Every day | ORAL | Status: DC
  Administered 2016-09-02 – 2016-09-05 (×4): 81 mg via ORAL
  Filled 2016-09-02 (×4): qty 1

## 2016-09-02 MED ORDER — METOPROLOL SUCCINATE ER 50 MG PO TB24
50.0000 mg | ORAL_TABLET | Freq: Every day | ORAL | Status: DC
  Administered 2016-09-03 – 2016-09-05 (×3): 50 mg via ORAL
  Filled 2016-09-02 (×3): qty 1

## 2016-09-02 MED ORDER — DEXTROSE 5 % IV SOLN
500.0000 mg | INTRAVENOUS | Status: DC
  Administered 2016-09-03: 500 mg via INTRAVENOUS
  Filled 2016-09-02 (×2): qty 500

## 2016-09-02 NOTE — Progress Notes (Signed)
Dr. Doy Hutching made aware of troponin of 0.56, no new orders at this time.  Clarise Cruz, RN

## 2016-09-02 NOTE — ED Notes (Signed)
Pt's family came to nurse's station reporting increased difficulty breathing. Pt O2 sat was 96% but pt was wheezing and working hard to breathe. Administered duoneb and solumedrol per Dr. Doy Hutching' orders.

## 2016-09-02 NOTE — ED Provider Notes (Signed)
[facility] Emergency Department Provider Note  ____________________________________________  Time seen: Approximately 11:03 AM  I have reviewed the triage vital signs and the nursing notes.   HISTORY  Chief Complaint Cough and Fever   HPI Samantha Dawson is a 82 y.o. female history of COPD, hypertension, hyperlipidemia who presents for evaluation of fever. Patient reports that on Tuesday she received her flu shot and had a regular checkup with her primary care doctor. She was feeling well. On Wednesday she started having headache, body aches, and chills. This morning she felt markedly worse and asked her kids to bring her to the emergency room for evaluation. Patient reports severe nausea and hasn't been eating or drinking for the last few days. She denies vomiting. She does feel very short of breath and has a dry cough. She denies chest pain. She endorses severe body aches and a frontal throbbing headache for the last few days. She denies neck stiffness or rash. She denies dysuria, abdominal pain. She does endorse diarrhea.  Past Medical History:  Diagnosis Date  . Anxiety   . Controlled substance agreement signed 03/22/2016  . 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 Encounter for chronic pain management   . Hyperlipidemia   . Hypertension   . Osteoarthritis   . Osteoporosis 03/22/2016  . Primary osteoarthritis of both hands 05/03/2016    Patient Active Problem List   Diagnosis Date Noted  . Left thyroid nodule 07/13/2016  . Abnormal bone scan of cervical spine 06/18/2016  . Primary osteoarthritis of both hands 05/03/2016  . Elevated serum alkaline phosphatase level 05/03/2016  . Hyperkalemia 05/03/2016  . Medication monitoring encounter 05/03/2016  . 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 Tobacco abuse 04/17/2016  . Controlled substance agreement signed 03/22/2016  . Osteoporosis 03/22/2016  . Back pain 03/22/2016  .  Hyperlipidemia 10/25/2015  . Essential hypertension 10/25/2015  . Panlobular emphysema ([place]) 10/25/2015  . Protein calorie malnutrition ([place]) 05/26/2015  . Chronic pain 05/25/2015  . Acute anxiety 05/25/2015  . Actinic keratosis 05/25/2015    Past Surgical History:  Procedure Laterality Date  . ABDOMINAL HYSTERECTOMY    . CATARACT EXTRACTION    . endaryerectomy    . renal stenting      Prior to Admission medications   Medication Sig Start Date End Date Taking? Authorizing Provider  albuterol (PROVENTIL HFA;VENTOLIN HFA) 108 (90 Base) MCG/ACT inhaler Inhale 2 puffs into the lungs every 6 (six) hours as needed for wheezing or shortness of breath. 08/29/16   Arnetha Courser, MD  aspirin 81 MG tablet Take 81 mg by mouth daily.    Historical Provider, MD  clopidogrel (PLAVIX) 75 MG tablet Take 1 tablet (75 mg total) by mouth daily. 06/13/16   Arnetha Courser, MD  co-enzyme Q-10 30 MG capsule Take 30 mg by mouth 3 (three) times daily.    Historical Provider, MD  Glycopyrrolate-Formoterol (BEVESPI AEROSPHERE) 9-4.8 MCG/ACT AERO Inhale 2 puffs into the lungs 2 (two) times daily. 08/29/16   Arnetha Courser, MD  HYDROcodone-acetaminophen (NORCO) 10-325 MG tablet Take 0.5-1 tablets by mouth every 6 (six) hours as needed. 08/29/16   Arnetha Courser, MD  HYDROcodone-acetaminophen (NORCO) 10-325 MG tablet Take 0.5-1 tablets by mouth every 6 (six) hours as needed. 08/29/16   Arnetha Courser, MD  HYDROcodone-acetaminophen (NORCO) 10-325 MG tablet Take 0.5-1 tablets by mouth every 6 (six) hours as needed. 08/29/16   Arnetha Courser, MD  metoprolol succinate (TOPROL-XL) 50  MG 24 hr tablet Take 1 tablet (50 mg total) by mouth daily. Take with or immediately following a meal. 05/30/16   Arnetha Courser, MD  pantoprazole (PROTONIX) 40 MG tablet TAKE ONE TABLET AT BEDTIME 03/16/16   Ashok Norris, MD  rosuvastatin (CRESTOR) 5 MG tablet Take 1 tablet (5 mg total) by mouth at bedtime. 07/13/16   Arnetha Courser, MD    tiotropium (SPIRIVA HANDIHALER) 18 MCG inhalation capsule Place 1 capsule (18 mcg total) into inhaler and inhale daily. 07/04/16   Arnetha Courser, MD  valsartan (DIOVAN) 80 MG tablet Take 1 tablet (80 mg total) by mouth daily. 05/30/16   Arnetha Courser, MD    Allergies Review of patient's allergies indicates no known allergies.  Family History  Problem Relation Age of Onset  . Cancer Mother   . Coronary artery disease Father   . Cerebrovascular Accident Father     Social History Social History  Substance Use Topics  . Smoking status: Current Every Day Smoker    Packs/day: 0.20    Types: Cigarettes  . Smokeless tobacco: Never Used  . Alcohol use No    Review of Systems  Constitutional: + fever and chills, body aches and generalized weakness Eyes: Negative for visual changes. ENT: Negative for sore throat. Cardiovascular: Negative for chest pain. Respiratory: + shortness of breath and cough Gastrointestinal: Negative for abdominal pain, vomiting or diarrhea. + nausea Genitourinary: Negative for dysuria. Musculoskeletal: Negative for back pain. Skin: Negative for rash. Neurological: Negative for weakness or numbness. + headaches  ____________________________________________   PHYSICAL EXAM:  VITAL SIGNS: ED Triage Vitals  Enc Vitals Group     BP 09/02/16 1023 (!) 79/38     Pulse Rate 09/02/16 1023 (!) 134     Resp 09/02/16 1023 20     Temp 09/02/16 1023 99 F (37.2 C)     Temp Source 09/02/16 1023 Oral     SpO2 09/02/16 1023 (!) 83 %     Weight 09/02/16 1024 113 lb (51.3 kg)     Height --      Head Circumference --      Peak Flow --      Pain Score --      Pain Loc --      Pain Edu? --      Excl. in [place]? --     Constitutional: Alert and oriented. Well appearing and in no apparent distress. HEENT:      Head: Normocephalic and atraumatic.         Eyes: Conjunctivae are normal. Sclera is non-icteric. EOMI. PERRL      Mouth/Throat: Mucous membranes are moist.        Neck: Supple with no signs of meningismus. Cardiovascular: Regular rate and rhythm. No murmurs, gallops, or rubs. 2+ symmetrical distal pulses are present in all extremities. No JVD. Respiratory: Hypoxic on RA, improve to upper 90% on 2 L [place], decreased air movement bilaterally, no crackles or wheezing. Gastrointestinal: Soft, non tender, and non distended with positive bowel sounds. No rebound or guarding. Musculoskeletal: Nontender with normal range of motion in all extremities. No edema, cyanosis, or erythema of extremities. Neurologic: Normal speech and language. Face is symmetric. Moving all extremities. No gross focal neurologic deficits are appreciated. Skin: Skin is warm, dry and intact. No rash noted. Psychiatric: Mood and affect are normal. Speech and behavior are normal.  ____________________________________________   LABS (all labs ordered are listed, but only abnormal results are displayed)  Labs Reviewed  COMPREHENSIVE METABOLIC PANEL - Abnormal; Notable for the following:       Result Value   Glucose, Bld 161 (*)    BUN 25 (*)    ALT 13 (*)    GFR calc non Af Amer 56 (*)    All other components within normal limits  CBC WITH DIFFERENTIAL/PLATELET - Abnormal; Notable for the following:    WBC 12.2 (*)    Platelets 145 (*)    Neutro Abs 9.2 (*)    Lymphs Abs 0.8 (*)    Monocytes Absolute 2.1 (*)    All other components within normal limits  CULTURE, BLOOD (ROUTINE X 2)  CULTURE, BLOOD (ROUTINE X 2)  URINE CULTURE  CULTURE, BLOOD (ROUTINE X 2)  CULTURE, BLOOD (ROUTINE X 2)  C DIFFICILE QUICK SCREEN W PCR REFLEX  LACTIC ACID, PLASMA  INFLUENZA PANEL BY PCR (TYPE A & B, H1N1)  LACTIC ACID, PLASMA  URINALYSIS COMPLETEWITH MICROSCOPIC ([facility] ONLY)  TROPONIN I  LEGIONELLA PNEUMOPHILA SEROGP 1 UR AG   ____________________________________________  EKG  ED ECG REPORT I, Rudene Re, the attending physician, personally viewed and interpreted this  ECG.  Sinus tachycardia, rate of 120, normal intervals, left axis deviation, no ST elevations or depressions. ____________________________________________  RADIOLOGY  CXR: RLL PNA ____________________________________________   PROCEDURES  Procedure(s) performed: None Procedures Critical Care performed:  Yes  CRITICAL CARE Performed by: Rudene Re  Total critical care time: 35 minutes  Critical care time was exclusive of separately billable procedures and treating other patients.  Critical care was necessary to treat or prevent imminent or life-threatening deterioration.  Critical care was time spent personally by me on the following activities: development of treatment plan with patient and/or surrogate as well as nursing, discussions with consultants, evaluation of patient's response to treatment, examination of patient, obtaining history from patient or surrogate, ordering and performing treatments and interventions, ordering and review of laboratory studies, ordering and review of radiographic studies, pulse oximetry and re-evaluation of patient's condition.   ____________________________________________   INITIAL IMPRESSION / ASSESSMENT AND PLAN / ED COURSE  82 y.o. female history of COPD, hypertension, hyperlipidemia who presents for evaluation of fever, body aches, chills, nausea, HA, cough, SOB, diarrhea. Symptoms started 24 hours after receiving flu shot. Patient with new oxygen requirement currently on 2L, has decrease air movement bilaterally. She has temp 87F but is tachycardic and BP in the low 100s during my evaluation (BP in the 70s in triage). Code sepsis initiated. Ddx reaction for flu vaccine, flu, legionella PNA, dehydration. Will give IVF, labs, blood culture, Flu swab, C. Diff and Legionella, UA. Will hold off abx for possible source in case this is Flu.  Clinical Course  Comment By Time  Chest x-ray concerning for right lower lobe pneumonia. White  count is 12.2. Lactate is normal at 1.1. Heart rate has improved with IV fluids and so has blood pressure. Patient has received ceftriaxone and azithromycin. UA still pending. Flu is negative. We'll consult hospitalist for admission. Rudene Re, MD 10/28 1308    ED Sepsis - Repeat Assessment   Performed at:    1:09 PM on 09/02/2016   Last Vitals:    Blood pressure 117/71, pulse (!) 108, temperature 99 F (37.2 C), temperature source Oral, resp. rate (!) 24, weight 113 lb (51.3 kg), SpO2 97 %.  Heart:      RRR  Lungs:     CTAB  Capillary Refill:   brisk  Peripheral Pulse (include  location): Strong radial   Skin (include color):   normal  Pertinent labs & imaging results that were available during my care of the patient were reviewed by me and considered in my medical decision making (see chart for details).    ____________________________________________   FINAL CLINICAL IMPRESSION(S) / ED DIAGNOSES  Final diagnoses:  Sepsis, due to unspecified organism [facility])  Community acquired pneumonia of right lower lobe of lung ([place])      NEW MEDICATIONS STARTED DURING THIS VISIT:  New Prescriptions   No medications on file     Note:  This document was prepared using Dragon voice recognition software and may include unintentional dictation errors.    Rudene Re, MD 09/02/16 1311

## 2016-09-02 NOTE — ED Notes (Signed)
Pt reports cough, congestion & fever the past three days. Pt's cough is current non-productive, strong cough. Pt was coughing up yellowish/ greenish mucous before which has now stopped. Pt reports tightness in her chest & a tender stomach when coughing. Pt reports having diarrhea that started two days ago and had 5 or 6 episodes. Pt reports diarrhea has stopped.

## 2016-09-02 NOTE — H&P (Signed)
History and Physical    Samantha Dawson [id number] DOB: May 15, 1929 DOA: 09/02/2016  Referring physician: Dr. Alfred Levins PCP: Ashok Norris, MD  Specialists: none  Chief Complaint: SOB and cough  HPI: Samantha Dawson is a 82 y.o. female has a past medical history significant for COPD, HTN, and chronic pain now with progressive SOB and cough with fever. In ER, CXR shows RLL pneumonia. Pt is febrile and tachycardic. WBC elevated. She is now admitted. Has CP with coughing. Some diarrhea. Some nausea. No vomiting. She reports she is DNR.  Review of Systems: The patient denies weight loss,, vision loss, decreased hearing, hoarseness, syncope,  peripheral edema, balance deficits, hemoptysis, abdominal pain, melena, hematochezia, severe indigestion/heartburn, hematuria, incontinence, genital sores, muscle weakness, suspicious skin lesions, transient blindness, difficulty walking, depression, unusual weight change, abnormal bleeding, enlarged lymph nodes, angioedema, and breast masses.   Past Medical History:  Diagnosis Date  . Anxiety   . Controlled substance agreement signed 03/22/2016  . 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 Encounter for chronic pain management   . Hyperlipidemia   . Hypertension   . Osteoarthritis   . Osteoporosis 03/22/2016  . Primary osteoarthritis of both hands 05/03/2016   Past Surgical History:  Procedure Laterality Date  . ABDOMINAL HYSTERECTOMY    . CATARACT EXTRACTION    . endaryerectomy    . renal stenting     Social History:  reports that she has been smoking Cigarettes.  She has been smoking about 0.20 packs per day. She has never used smokeless tobacco. She reports that she does not drink alcohol or use drugs.  No Known Allergies  Family History  Problem Relation Age of Onset  . Cancer Mother   . Coronary artery disease Father   . Cerebrovascular Accident Father     Prior to Admission medications   Medication Sig Start Date End Date  Taking? Authorizing Provider  albuterol (PROVENTIL HFA;VENTOLIN HFA) 108 (90 Base) MCG/ACT inhaler Inhale 2 puffs into the lungs every 6 (six) hours as needed for wheezing or shortness of breath. 08/29/16   Arnetha Courser, MD  aspirin 81 MG tablet Take 81 mg by mouth daily.    Historical Provider, MD  clopidogrel (PLAVIX) 75 MG tablet Take 1 tablet (75 mg total) by mouth daily. 06/13/16   Arnetha Courser, MD  co-enzyme Q-10 30 MG capsule Take 30 mg by mouth 3 (three) times daily.    Historical Provider, MD  Glycopyrrolate-Formoterol (BEVESPI AEROSPHERE) 9-4.8 MCG/ACT AERO Inhale 2 puffs into the lungs 2 (two) times daily. 08/29/16   Arnetha Courser, MD  HYDROcodone-acetaminophen (NORCO) 10-325 MG tablet Take 0.5-1 tablets by mouth every 6 (six) hours as needed. 08/29/16   Arnetha Courser, MD  HYDROcodone-acetaminophen (NORCO) 10-325 MG tablet Take 0.5-1 tablets by mouth every 6 (six) hours as needed. 08/29/16   Arnetha Courser, MD  HYDROcodone-acetaminophen (NORCO) 10-325 MG tablet Take 0.5-1 tablets by mouth every 6 (six) hours as needed. 08/29/16   Arnetha Courser, MD  metoprolol succinate (TOPROL-XL) 50 MG 24 hr tablet Take 1 tablet (50 mg total) by mouth daily. Take with or immediately following a meal. 05/30/16   Arnetha Courser, MD  pantoprazole (PROTONIX) 40 MG tablet TAKE ONE TABLET AT BEDTIME 03/16/16   Ashok Norris, MD  rosuvastatin (CRESTOR) 5 MG tablet Take 1 tablet (5 mg total) by mouth at bedtime. 07/13/16   Arnetha Courser, MD  tiotropium (SPIRIVA HANDIHALER) 18 MCG inhalation capsule Place 1  capsule (18 mcg total) into inhaler and inhale daily. 07/04/16   Arnetha Courser, MD  valsartan (DIOVAN) 80 MG tablet Take 1 tablet (80 mg total) by mouth daily. 05/30/16   Arnetha Courser, MD   Physical Exam: Vitals:   09/02/16 1024 09/02/16 1100 09/02/16 1230 09/02/16 1259  BP:  (!) 96/54 (!) 131/52 117/71  Pulse:  (!) 120  (!) 108  Resp:  (!) 29 (!) 24 (!) 24  Temp:      TempSrc:      SpO2:  96%  97%   Weight: 51.3 kg (113 lb)        General:  In moderate distress, WDWN, [place]/AT  Eyes: PERRL, EOMI, no scleral icterus , conjunctiva clear  ENT: moist oropharynx without exudate, TM's benign, dentition fair  Neck: supple, no lymphadenopathy. No bruits or thyromegaly  Cardiovascular: rapid rate with regular rhythm without MRG; 2+ peripheral pulses, no JVD, no peripheral edema  Respiratory: decreased breath sounds with scattered rhonchi. No wheezes or rales. Respiratory effort increased  Abdomen: soft, non tender to palpation, positive bowel sounds, no guarding, no rebound  Skin: no rashes or lesions  Musculoskeletal: normal bulk and tone, no joint swelling  Psychiatric: normal mood and affect, A&OX3   Neurologic: CN 2-12 grossly intact, Motor strength 5/5 in all 4 groups with symmetric DTR's and non-focal sensory exam  Labs on Admission:  Basic Metabolic Panel:  Recent Labs Lab 08/29/16 1047 09/02/16 1034  NA 143 135  K 5.2 4.2  CL 106 101  CO2 28 24  GLUCOSE 95 161*  BUN 17 25*  CREATININE 0.99* 0.91  CALCIUM 9.4 9.0   Liver Function Tests:  Recent Labs Lab 08/29/16 1047 09/02/16 1034  AST 18 34  ALT 9 13*  ALKPHOS 107 92  BILITOT 0.9 1.1  PROT 7.0 7.7  ALBUMIN 4.2 3.5   No results for input(s): LIPASE, AMYLASE in the last 168 hours. No results for input(s): AMMONIA in the last 168 hours. CBC:  Recent Labs Lab 08/29/16 1047 09/02/16 1034  WBC 8.9 12.2*  NEUTROABS 5,696 9.2*  HGB 14.9 13.4  HCT 44.7 39.3  MCV 99.1 97.7  PLT 177 145*   Cardiac Enzymes: No results for input(s): CKTOTAL, CKMB, CKMBINDEX, TROPONINI in the last 168 hours.  BNP (last 3 results) No results for input(s): BNP in the last 8760 hours.  ProBNP (last 3 results) No results for input(s): PROBNP in the last 8760 hours.  CBG: No results for input(s): GLUCAP in the last 168 hours.  Radiological Exams on Admission: Dg Chest 2 View  Result Date: 09/02/2016 CLINICAL DATA:   Shortness of breath EXAM: CHEST  2 VIEW COMPARISON:  None. FINDINGS: Top-normal heart size. Aortic atherosclerosis. Otherwise normal mediastinal contour. No pneumothorax. No pleural effusion. Hyperinflated lungs. Peripheral basilar predominant reticular opacities in both lungs. No acute consolidative airspace disease. IMPRESSION: 1. Peripheral basilar predominant reticular opacities in both lungs, most consistent with interstitial lung disease. 2. Hyperinflated lungs suggest obstructive lung disease. 3. Aortic atherosclerosis . Electronically Signed   By: Ilona Sorrel M.D.   On: 09/02/2016 12:25    EKG: Independently reviewed.  Assessment/Plan Principal Problem:   Sepsis ([place]) Active Problems:   Panlobular emphysema ([place])   CAP (community acquired pneumonia)   Hyperglycemia   Will admit to floor as DNR with IV fluids, IV ABX, IV steroids, and SVN's. Follow sugars. Wean O2 as tolerated. Repeat labs and CXR in AM. Cultures sent  Diet: soft Fluids: NS@100   DVT Prophylaxis: SQ Heparin  Code Status: DNR  Family Communication: none  Disposition Plan: SNF  Time spent: 55 min

## 2016-09-02 NOTE — ED Triage Notes (Signed)
Pt to ed with c/o cough, congestion and fever x 3 days.

## 2016-09-03 ENCOUNTER — Inpatient Hospital Stay
Admit: 2016-09-03 | Discharge: 2016-09-03 | Disposition: A | Discharge status: Home / Self Care | Payer: Medicare Other | Attending: Internal Medicine | Admitting: Internal Medicine

## 2016-09-03 ENCOUNTER — Inpatient Hospital Stay: Payer: Medicare Other

## 2016-09-03 LAB — CBC
HEMATOCRIT: 37.9 % (ref 35.0–47.0)
Hemoglobin: 12.7 g/dL (ref 12.0–16.0)
MCH: 33.6 pg (ref 26.0–34.0)
MCHC: 33.5 g/dL (ref 32.0–36.0)
MCV: 100.4 fL — AB (ref 80.0–100.0)
Platelets: 156 10*3/uL (ref 150–440)
RBC: 3.77 MIL/uL — AB (ref 3.80–5.20)
RDW: 13.5 % (ref 11.5–14.5)
WBC: 7.6 10*3/uL (ref 3.6–11.0)

## 2016-09-03 LAB — C DIFFICILE QUICK SCREEN W PCR REFLEX
C Diff antigen: NEGATIVE
C Diff interpretation: NOT DETECTED
C Diff toxin: NEGATIVE

## 2016-09-03 LAB — GLUCOSE, CAPILLARY
GLUCOSE-CAPILLARY: 200 mg/dL — AB (ref 65–99)
GLUCOSE-CAPILLARY: 205 mg/dL — AB (ref 65–99)
Glucose-Capillary: 153 mg/dL — ABNORMAL HIGH (ref 65–99)
Glucose-Capillary: 183 mg/dL — ABNORMAL HIGH (ref 65–99)

## 2016-09-03 LAB — COMPREHENSIVE METABOLIC PANEL
ALT: 16 U/L (ref 14–54)
AST: 31 U/L (ref 15–41)
Albumin: 3.1 g/dL — ABNORMAL LOW (ref 3.5–5.0)
Alkaline Phosphatase: 85 U/L (ref 38–126)
Anion gap: 7 (ref 5–15)
BUN: 17 mg/dL (ref 6–20)
CHLORIDE: 109 mmol/L (ref 101–111)
CO2: 25 mmol/L (ref 22–32)
Calcium: 8.6 mg/dL — ABNORMAL LOW (ref 8.9–10.3)
Creatinine, Ser: 0.66 mg/dL (ref 0.44–1.00)
Glucose, Bld: 155 mg/dL — ABNORMAL HIGH (ref 65–99)
POTASSIUM: 4.2 mmol/L (ref 3.5–5.1)
SODIUM: 141 mmol/L (ref 135–145)
Total Bilirubin: 0.6 mg/dL (ref 0.3–1.2)
Total Protein: 7.1 g/dL (ref 6.5–8.1)

## 2016-09-03 LAB — URINE CULTURE

## 2016-09-03 LAB — TROPONIN I
TROPONIN I: 0.42 ng/mL — AB (ref <0.03)
Troponin I: 0.34 ng/mL (ref <0.03)

## 2016-09-03 MED ORDER — AZITHROMYCIN 500 MG PO TABS
500.0000 mg | ORAL_TABLET | Freq: Every day | ORAL | Status: DC
  Administered 2016-09-04 – 2016-09-05 (×2): 500 mg via ORAL
  Filled 2016-09-03 (×2): qty 1

## 2016-09-03 NOTE — Care Management Important Message (Signed)
Important Message  Patient Details  Name: LYNLEY ZACHMAN MRN: [id number] Date of Birth: 1928/11/11   Medicare Important Message Given:  Yes    Corliss Lamartina A, RN 09/03/2016, 2:57 PM

## 2016-09-03 NOTE — Progress Notes (Signed)
PHARMACIST - PHYSICIAN COMMUNICATION  CONCERNING: Antibiotic IV to Oral Route Change Policy  RECOMMENDATION: This patient is receiving AZITHROMYCIN by the intravenous route.  Based on criteria approved by the Pharmacy and Therapeutics Committee, the antibiotic(s) is/are being converted to the equivalent oral dose form(s).   DESCRIPTION: These criteria include:  Patient being treated for a respiratory tract infection, urinary tract infection, cellulitis or clostridium difficile associated diarrhea if on metronidazole  The patient is not neutropenic and does not exhibit a GI malabsorption state  The patient is eating (either orally or via tube) and/or has been taking other orally administered medications for a least 24 hours  The patient is improving clinically and has a Tmax < 100.5  If you have questions about this conversion, please contact the Pharmacy Department  []   [phone] )  [facility] [x]   [phone] )  [facility] []   [phone] )  [facility] []   [phone] )  [facility] []   [phone] )  [place] PharmD Clinical Pharmacist 09/03/2016

## 2016-09-03 NOTE — Progress Notes (Addendum)
[facility] at [place] NAME: Samantha Dawson    MR#:  [id number]  DATE OF BIRTH:  07-05-1929  SUBJECTIVE:  CHIEF COMPLAINT:   Chief Complaint  Patient presents with  . Cough  . Fever   Still cough, sputum and shortness of breath, on O2 [place] 4L. REVIEW OF SYSTEMS:  Review of Systems  Constitutional: Positive for malaise/fatigue. Negative for chills and fever.  Eyes: Negative for blurred vision and double vision.  Respiratory: Positive for cough, sputum production, shortness of breath and wheezing. Negative for hemoptysis and stridor.   Cardiovascular: Negative for chest pain and leg swelling.  Gastrointestinal: Negative for abdominal pain, diarrhea, nausea and vomiting.  Genitourinary: Negative for dysuria and urgency.  Musculoskeletal: Negative for joint pain.  Skin: Negative for itching and rash.  Neurological: Positive for weakness. Negative for dizziness, focal weakness, loss of consciousness and headaches.  Psychiatric/Behavioral: Negative for depression. The patient is not nervous/anxious.     DRUG ALLERGIES:  No Known Allergies VITALS:  Blood pressure 121/82, pulse (!) 109, temperature 97.5 F (36.4 C), temperature source Oral, resp. rate 20, height 5\' 3"  (1.6 m), weight 117 lb 9 oz (53.3 kg), SpO2 100 %. PHYSICAL EXAMINATION:  Physical Exam  Constitutional: She is oriented to person, place, and time.  Mild distress  HENT:  Head: Normocephalic.  Mouth/Throat: Oropharynx is clear and moist.  Eyes: Conjunctivae and EOM are normal.  Neck: Normal range of motion. Neck supple. No JVD present. No tracheal deviation present.  Cardiovascular: Normal rate, regular rhythm and normal heart sounds.  Exam reveals no gallop.   No murmur heard. Pulmonary/Chest: She is in respiratory distress. She has wheezes. She has no rales.  Bilateral expiratory wheezing, crackles and rhonchi.  Abdominal: Soft. Bowel sounds are normal. She exhibits no  distension. There is no tenderness.  Musculoskeletal: She exhibits no edema or tenderness.  Neurological: She is alert and oriented to person, place, and time. No cranial nerve deficit.  Skin: No rash noted. No erythema.  Psychiatric:  Lethargic.   LABORATORY PANEL:   CBC  Recent Labs Lab 09/03/16 0601  WBC 7.6  HGB 12.7  HCT 37.9  PLT 156   ------------------------------------------------------------------------------------------------------------------ Chemistries   Recent Labs Lab 09/03/16 0601  NA 141  K 4.2  CL 109  CO2 25  GLUCOSE 155*  BUN 17  CREATININE 0.66  CALCIUM 8.6*  AST 31  ALT 16  ALKPHOS 85  BILITOT 0.6   RADIOLOGY:  Dg Chest 2 View  Result Date: 09/03/2016 CLINICAL DATA:  Community-acquired pneumonia EXAM: CHEST  2 VIEW COMPARISON:  Chest radiograph from one day prior. FINDINGS: Stable cardiomediastinal silhouette with normal heart size and aortic atherosclerosis. No pneumothorax. Increased trace bilateral pleural effusions. Hyperinflated lungs. Stable biapical pleural-parenchymal scarring. Patchy bibasilar lung opacities appear stable. Hazy opacity in the peripheral upper right lung appears slightly increased. IMPRESSION: 1. Nonspecific patchy bibasilar lung opacities are stable. Hazy opacity in the peripheral right upper lung appears slightly increased. These findings could represent multilobar pneumonia, with underlying interstitial lung disease not excluded. 2. Increased trace bilateral pleural effusions. 3. Hyperinflated lungs, suggesting COPD. 4. Aortic atherosclerosis. Electronically Signed   By: Ilona Sorrel M.D.   On: 09/03/2016 08:31   Dg Chest 2 View  Result Date: 09/02/2016 CLINICAL DATA:  Shortness of breath EXAM: CHEST  2 VIEW COMPARISON:  None. FINDINGS: Top-normal heart size. Aortic atherosclerosis. Otherwise normal mediastinal contour. No pneumothorax. No pleural effusion. Hyperinflated lungs. Peripheral  basilar predominant reticular  opacities in both lungs. No acute consolidative airspace disease. IMPRESSION: 1. Peripheral basilar predominant reticular opacities in both lungs, most consistent with interstitial lung disease. 2. Hyperinflated lungs suggest obstructive lung disease. 3. Aortic atherosclerosis . Electronically Signed   By: Ilona Sorrel M.D.   On: 09/02/2016 12:25   ASSESSMENT AND PLAN:   Sepsis with CAP. Continue Zithromax and Rocephin, NEB when necessary, Robitussin when necessary, follow-up cultures.  Acute respiratory failure with hypoxia. Continue treatment as above, continue O2 [place], now 4L, try to wean off oxygen.  COPD exacerbation with Panlobular emphysema. Continue IV steroids, thrill and nebulizer when necessary.  Elevated troponin, due to demanding ischemia secondary to above. Continue Plavix, Lopressor and crestor.Follow-up with cardiology consult.  Hypertension. Continue hypertension medication.  Generalized weakness. PT evaluation.   All the records are reviewed and case discussed with Care Management/Social Worker. Management plans discussed with the patient, family and they are in agreement.  CODE STATUS: DO NOT RESUSCITATE  TOTAL TIME TAKING CARE OF THIS PATIENT: 43 minutes.   More than 50% of the time was spent in counseling/coordination of care: YES  POSSIBLE D/C IN 3 DAYS, DEPENDING ON CLINICAL CONDITION.   Demetrios Loll M.D on 09/03/2016 at 11:06 AM  Between 7am to 6pm - Pager - [phone]  After 6pm go to [email or web address] - Proofreader  [facility]  Office  [phone]  CC: Primary care physician; Ashok Norris, MD  Note: This dictation was prepared with Dragon dictation along with smaller phrase technology. Any transcriptional errors that result from this process are unintentional.

## 2016-09-04 LAB — ECHOCARDIOGRAM COMPLETE
HEIGHTINCHES: 63 in
Weight: 1881 oz

## 2016-09-04 LAB — GLUCOSE, CAPILLARY
GLUCOSE-CAPILLARY: 164 mg/dL — AB (ref 65–99)
GLUCOSE-CAPILLARY: 167 mg/dL — AB (ref 65–99)
GLUCOSE-CAPILLARY: 157 mg/dL — AB (ref 65–99)
Glucose-Capillary: 165 mg/dL — ABNORMAL HIGH (ref 65–99)

## 2016-09-04 LAB — LEGIONELLA PNEUMOPHILA SEROGP 1 UR AG: L. PNEUMOPHILA SEROGP 1 UR AG: NEGATIVE

## 2016-09-04 LAB — ALKALINE PHOSPHATASE ISOENZYMES
ALKALINE PHOSPHATASE (ALP ISO): 127 U/L (ref 33–130)
Bone Isoenzymes: 72 % — ABNORMAL HIGH (ref 28–66)
Intestinal Isoenzymes: 0 % — ABNORMAL LOW (ref 1–24)
Liver Isoenzymes: 28 % (ref 25–69)

## 2016-09-04 LAB — PROCALCITONIN: PROCALCITONIN: 0.17 ng/mL

## 2016-09-04 MED ORDER — LEVALBUTEROL HCL 0.63 MG/3ML IN NEBU
0.6300 mg | INHALATION_SOLUTION | Freq: Four times a day (QID) | RESPIRATORY_TRACT | Status: DC
  Administered 2016-09-04 – 2016-09-05 (×4): 0.63 mg via RESPIRATORY_TRACT
  Filled 2016-09-04 (×5): qty 3

## 2016-09-04 MED ORDER — CEPASTAT 14.5 MG MT LOZG
1.0000 | LOZENGE | OROMUCOSAL | Status: DC | PRN
  Filled 2016-09-04: qty 9

## 2016-09-04 MED ORDER — CEFUROXIME AXETIL 250 MG PO TABS
500.0000 mg | ORAL_TABLET | Freq: Two times a day (BID) | ORAL | Status: DC
  Administered 2016-09-05: 500 mg via ORAL
  Filled 2016-09-04: qty 2

## 2016-09-04 NOTE — Progress Notes (Signed)
[place] at [place] NAME: Samantha Dawson    MR#:  [id number]  DATE OF BIRTH:  1929-01-03  SUBJECTIVE: Seen at the bedside. Having coughing spells, some wheezing, tachycardia. Not hypoxic anymore, off of oxygen.   CHIEF COMPLAINT:   Chief Complaint  Patient presents with  . Cough  . Fever   Still cough, sputum and shortness of breath, on O2 [place] 4L. REVIEW OF SYSTEMS:  Review of Systems  Constitutional: Positive for malaise/fatigue. Negative for chills and fever.  Eyes: Negative for blurred vision and double vision.  Respiratory: Positive for cough, sputum production and wheezing. Negative for hemoptysis, shortness of breath and stridor.   Cardiovascular: Negative for chest pain and leg swelling.  Gastrointestinal: Negative for abdominal pain, diarrhea, nausea and vomiting.  Genitourinary: Negative for dysuria and urgency.  Musculoskeletal: Negative for joint pain.  Skin: Negative for itching and rash.  Neurological: Positive for weakness. Negative for dizziness, focal weakness, loss of consciousness and headaches.  Psychiatric/Behavioral: Negative for depression. The patient is not nervous/anxious.     DRUG ALLERGIES:  No Known Allergies VITALS:  Blood pressure 135/60, pulse (!) 115, temperature 98.4 F (36.9 C), temperature source Oral, resp. rate 18, height 5\' 3"  (1.6 m), weight 53.1 kg (117 lb), SpO2 96 %. PHYSICAL EXAMINATION:  Physical Exam  Constitutional: She is oriented to person, place, and time.  Mild distress  HENT:  Head: Normocephalic.  Mouth/Throat: Oropharynx is clear and moist.  Eyes: Conjunctivae and EOM are normal.  Neck: Normal range of motion. Neck supple. No JVD present. No tracheal deviation present.  Cardiovascular: Normal rate, regular rhythm and normal heart sounds.  Exam reveals no gallop.   No murmur heard. Pulmonary/Chest: No respiratory distress. She has wheezes. She has no rales.  Bilateral  expiratory wheezing, crackles and rhonchi.  Abdominal: Soft. Bowel sounds are normal. She exhibits no distension. There is no tenderness.  Musculoskeletal: She exhibits no edema or tenderness.  Neurological: She is alert and oriented to person, place, and time. No cranial nerve deficit.  Skin: No rash noted. No erythema.  Psychiatric:  Lethargic.   LABORATORY PANEL:   CBC  Recent Labs Lab 09/03/16 0601  WBC 7.6  HGB 12.7  HCT 37.9  PLT 156   ------------------------------------------------------------------------------------------------------------------ Chemistries   Recent Labs Lab 09/03/16 0601  NA 141  K 4.2  CL 109  CO2 25  GLUCOSE 155*  BUN 17  CREATININE 0.66  CALCIUM 8.6*  AST 31  ALT 16  ALKPHOS 85  BILITOT 0.6   RADIOLOGY:  No results found. ASSESSMENT AND PLAN:   Sepsis with CAP. Continue Zithromax and Rocephin, NEB when necessary, Robitussin when necessary, follow-up cultures.  Acute respiratory failure with hypoxia.; Due to community-acquired pneumonia: Slowly improving, she  is off oxygen. Continue antibiotics Rocephin, Zithromax.  COPD exacerbation with Panlobular emphysema. Slowly improving, continue IV steroids, change the nebulizer doses of xopenox  because of tachycardia.   Hypertension. Continue hypertension medication.  Generalized weakness. PT evaluation.  Elevated troponin likely  secondary to demand ischemia because of suspected distress. Cardiology consult pending. Beta blockers, Crestor and Plavix. Elevated blood sugar secondary to steroid use. Continue sliding scale with coverage.  All the records are reviewed and case discussed with Care Management/Social Worker. Management plans discussed with the patient, family and they are in agreement.  CODE STATUS: DO NOT RESUSCITATE  TOTAL TIME TAKING CARE OF THIS PATIENT: 43 minutes.    POSSIBLE D/C IN 3  DAYS, DEPENDING ON CLINICAL CONDITION.   Epifanio Lesches M.D on  09/04/2016 at 10:37 AM  Between 7am to 6pm - Pager - [phone]  After 6pm go to [email or web address] - Proofreader  [facility]  Office  [phone]  CC: Primary care physician; Ashok Norris, MD  Note: This dictation was prepared with Dragon dictation along with smaller phrase technology. Any transcriptional errors that result from this process are unintentional.

## 2016-09-04 NOTE — Progress Notes (Signed)
Chaplain was making his rounds and visited with pt in room 125. Provided the ministry of prayer and pastoral support.    09/04/16 1245  Clinical Encounter Type  Visited With Patient  Visit Type Initial;Spiritual support  Referral From Nurse  Spiritual Encounters  Spiritual Needs Prayer

## 2016-09-04 NOTE — Consult Note (Signed)
Reason for Consult:SOB/Tachycardia Referring Physician: Dr Georgie Chard / Dr Ashok Norris primary  Samantha Dawson is an 82 y.o. female.  HPI: Pt is a 89 y/o who presented with SOB and congession.There has been palpitation and tachycardia. Denies blackout spells or syncope. There has been fever chill and weakness. Diarrhea also. He was found to have pneumonia RLL. He c/o of mild cp.  Past Medical History:  Diagnosis Date  . Anxiety   . Controlled substance agreement signed 03/22/2016  . 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 Encounter for chronic pain management   . Hyperlipidemia   . Hypertension   . Osteoarthritis   . Osteoporosis 03/22/2016  . Primary osteoarthritis of both hands 05/03/2016    Past Surgical History:  Procedure Laterality Date  . ABDOMINAL HYSTERECTOMY    . CATARACT EXTRACTION    . endaryerectomy    . renal stenting      Family History  Problem Relation Age of Onset  . Cancer Mother   . Coronary artery disease Father   . Cerebrovascular Accident Father     Social History:  reports that she has been smoking Cigarettes.  She has been smoking about 0.20 packs per day. She has never used smokeless tobacco. She reports that she does not drink alcohol or use drugs.  Allergies: No Known Allergies  Medications: I have reviewed the patient's current medications.  Results for orders placed or performed during the hospital encounter of 09/02/16 (from the past 48 hour(s))  Glucose, capillary     Status: Abnormal   Collection Time: 09/02/16  4:21 PM  Result Value Ref Range   Glucose-Capillary 136 (H) 65 - 99 mg/dL  Lactic acid, plasma     Status: None   Collection Time: 09/02/16  5:14 PM  Result Value Ref Range   Lactic Acid, Venous 1.4 0.5 - 1.9 mmol/L  Troponin I (q 6hr x 3)     Status: Abnormal   Collection Time: 09/02/16  5:14 PM  Result Value Ref Range   Troponin I 0.52 (HH) <0.03 ng/mL    Comment: CRITICAL VALUE NOTED. VALUE IS CONSISTENT WITH  PREVIOUSLY REPORTED/CALLED VALUE [facility]   Troponin I (q 6hr x 3)     Status: Abnormal   Collection Time: 09/02/16 11:24 PM  Result Value Ref Range   Troponin I 0.42 (HH) <0.03 ng/mL    Comment: CRITICAL RESULT CALLED TO, READ BACK BY AND VERIFIED WITH Lanae Crumbly RN AT 0010 09/03/16 MSS.   Troponin I (q 6hr x 3)     Status: Abnormal   Collection Time: 09/03/16  6:01 AM  Result Value Ref Range   Troponin I 0.34 (HH) <0.03 ng/mL    Comment: CRITICAL VALUE NOTED. VALUE IS CONSISTENT WITH PREVIOUSLY REPORTED/CALLED VALUE [facility]   Comprehensive metabolic panel     Status: Abnormal   Collection Time: 09/03/16  6:01 AM  Result Value Ref Range   Sodium 141 135 - 145 mmol/L   Potassium 4.2 3.5 - 5.1 mmol/L   Chloride 109 101 - 111 mmol/L   CO2 25 22 - 32 mmol/L   Glucose, Bld 155 (H) 65 - 99 mg/dL   BUN 17 6 - 20 mg/dL   Creatinine, Ser 0.66 0.44 - 1.00 mg/dL   Calcium 8.6 (L) 8.9 - 10.3 mg/dL   Total Protein 7.1 6.5 - 8.1 g/dL   Albumin 3.1 (L) 3.5 - 5.0 g/dL   AST 31 15 - 41 U/L   ALT 16 14 -  54 U/L   Alkaline Phosphatase 85 38 - 126 U/L   Total Bilirubin 0.6 0.3 - 1.2 mg/dL   GFR calc non Af Amer >60 >60 mL/min   GFR calc Af Amer >60 >60 mL/min    Comment: (NOTE) The eGFR has been calculated using the CKD EPI equation. This calculation has not been validated in all clinical situations. eGFR's persistently <60 mL/min signify possible Chronic Kidney Disease.    Anion gap 7 5 - 15  CBC     Status: Abnormal   Collection Time: 09/03/16  6:01 AM  Result Value Ref Range   WBC 7.6 3.6 - 11.0 K/uL   RBC 3.77 (L) 3.80 - 5.20 MIL/uL   Hemoglobin 12.7 12.0 - 16.0 g/dL   HCT 37.9 35.0 - 47.0 %   MCV 100.4 (H) 80.0 - 100.0 fL   MCH 33.6 26.0 - 34.0 pg   MCHC 33.5 32.0 - 36.0 g/dL   RDW 13.5 11.5 - 14.5 %   Platelets 156 150 - 440 K/uL  Glucose, capillary     Status: Abnormal   Collection Time: 09/03/16  8:00 AM  Result Value Ref Range   Glucose-Capillary 153 (H) 65 - 99 mg/dL    Comment 1 Notify RN    Comment 2 Document in Chart   Glucose, capillary     Status: Abnormal   Collection Time: 09/03/16 11:40 AM  Result Value Ref Range   Glucose-Capillary 200 (H) 65 - 99 mg/dL   Comment 1 Notify RN    Comment 2 Document in Chart   C difficile quick screen w PCR reflex     Status: None   Collection Time: 09/03/16  1:19 PM  Result Value Ref Range   C Diff antigen NEGATIVE NEGATIVE   C Diff toxin NEGATIVE NEGATIVE   C Diff interpretation No C. difficile detected.   Glucose, capillary     Status: Abnormal   Collection Time: 09/03/16  4:32 PM  Result Value Ref Range   Glucose-Capillary 205 (H) 65 - 99 mg/dL   Comment 1 Notify RN    Comment 2 Document in Chart   Glucose, capillary     Status: Abnormal   Collection Time: 09/03/16  9:08 PM  Result Value Ref Range   Glucose-Capillary 183 (H) 65 - 99 mg/dL  Glucose, capillary     Status: Abnormal   Collection Time: 09/04/16  7:21 AM  Result Value Ref Range   Glucose-Capillary 165 (H) 65 - 99 mg/dL  Procalcitonin - Baseline     Status: None   Collection Time: 09/04/16  8:37 AM  Result Value Ref Range   Procalcitonin 0.17 ng/mL    Comment:        Interpretation: PCT (Procalcitonin) <= 0.5 ng/mL: Systemic infection (sepsis) is not likely. Local bacterial infection is possible. (NOTE)         ICU PCT Algorithm               Non ICU PCT Algorithm    ----------------------------     ------------------------------         PCT < 0.25 ng/mL                 PCT < 0.1 ng/mL     Stopping of antibiotics            Stopping of antibiotics       strongly encouraged.               strongly encouraged.    ----------------------------     ------------------------------  PCT level decrease by               PCT < 0.25 ng/mL       >= 80% from peak PCT       OR PCT 0.25 - 0.5 ng/mL          Stopping of antibiotics                                             encouraged.     Stopping of antibiotics           encouraged.     ----------------------------     ------------------------------       PCT level decrease by              PCT >= 0.25 ng/mL       < 80% from peak PCT        AND PCT >= 0.5 ng/mL            Continuin g antibiotics                                              encouraged.       Continuing antibiotics            encouraged.    ----------------------------     ------------------------------     PCT level increase compared          PCT > 0.5 ng/mL         with peak PCT AND          PCT >= 0.5 ng/mL             Escalation of antibiotics                                          strongly encouraged.      Escalation of antibiotics        strongly encouraged.   Glucose, capillary     Status: Abnormal   Collection Time: 09/04/16 12:22 PM  Result Value Ref Range   Glucose-Capillary 164 (H) 65 - 99 mg/dL    Dg Chest 2 View  Result Date: 09/03/2016 CLINICAL DATA:  Community-acquired pneumonia EXAM: CHEST  2 VIEW COMPARISON:  Chest radiograph from one day prior. FINDINGS: Stable cardiomediastinal silhouette with normal heart size and aortic atherosclerosis. No pneumothorax. Increased trace bilateral pleural effusions. Hyperinflated lungs. Stable biapical pleural-parenchymal scarring. Patchy bibasilar lung opacities appear stable. Hazy opacity in the peripheral upper right lung appears slightly increased. IMPRESSION: 1. Nonspecific patchy bibasilar lung opacities are stable. Hazy opacity in the peripheral right upper lung appears slightly increased. These findings could represent multilobar pneumonia, with underlying interstitial lung disease not excluded. 2. Increased trace bilateral pleural effusions. 3. Hyperinflated lungs, suggesting COPD. 4. Aortic atherosclerosis. Electronically Signed   By: Ilona Sorrel M.D.   On: 09/03/2016 08:31    Review of Systems  Constitutional: Positive for diaphoresis, fever and malaise/fatigue.  HENT: Positive for congestion.   Eyes: Negative.   Respiratory: Positive  for cough and shortness of breath.   Cardiovascular: Positive for palpitations.  Gastrointestinal: Negative.   Genitourinary: Negative.  Musculoskeletal: Negative.   Skin: Negative.   Neurological: Positive for weakness.  Endo/Heme/Allergies: Negative.   Psychiatric/Behavioral: Negative.    Blood pressure 134/88, pulse 99, temperature 97.9 F (36.6 C), temperature source Oral, resp. rate 16, height '5\' 3"'$  (1.6 m), weight 53.1 kg (117 lb), SpO2 95 %. Physical Exam  Nursing note and vitals reviewed. Constitutional: She is oriented to person, place, and time. She appears well-developed and well-nourished.  HENT:  Head: Normocephalic and atraumatic.  Eyes: Conjunctivae and EOM are normal. Pupils are equal, round, and reactive to light.  Neck: Normal range of motion. Neck supple.  Cardiovascular: Normal rate, regular rhythm and normal heart sounds.   Respiratory: Effort normal and breath sounds normal.  GI: Soft. Bowel sounds are normal.  Musculoskeletal: Normal range of motion.  Neurological: She is alert and oriented to person, place, and time. She has normal reflexes.  Skin: Skin is warm and dry.  Psychiatric: She has a normal mood and affect.    Assessment/Plan: SOB COPD HTN Cough Tachycardia Pneumonia Anxiety Smoker Elevated troponin . PLAN Agree with admission and therapy Continue with antibx IV Agree with 02 supplimentation Inhalers for COPD and SOB HTN controlled with metoprolol/Diovan Continue protonix for GERD Consider ECHO for evaluation of SOB Demand ischemia for elevated troponin   Kaelah Hayashi D Kashena Novitski 09/04/2016, 3:38 PM

## 2016-09-04 NOTE — Consult Note (Signed)
[facility] Consultation Note  Patient ID: Samantha Dawson, MRN: [id number], DOB/AGE: Apr 10, 1929 82 y.o. Admit date: 09/02/2016   Date of Consult: 09/04/2016 Primary Physician: Ashok Norris, MD Primary Cardiologist: None  Chief Complaint:  Chief Complaint  Patient presents with  . Cough  . Fever   Reason for Consult: elevated troponin  HPI: 82 y.o. female with known essential hypertension, mixed hyperlipidemia and peripheral vascular disease status post endarterectomy in the past with COPD, having acute onset of right lower lobe pneumonia, chest pain, pressure, shortness of breath over a several-day period. The patient has had slow improvements of her condition were less cough and congestion and chest pain with antibiotics and conservative supportive care of pneumonia. As been no evidence of chest pain or other symptoms prior to this issue, consistent with no current evidence of acute coronary syndrome. Elevated pallor is most consistent with demand ischemia. She remains on high intensity cholesterol therapy with Crestor for her previous peripheral vascular disease as well as antiplatelet medication including Plavix, which is working well without significant amount of side effects. Metoprolol has been continued for hypertension control at this time  Past Medical History:  Diagnosis Date  . Anxiety   . Controlled substance agreement signed 03/22/2016  . 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 Encounter for chronic pain management   . Hyperlipidemia   . Hypertension   . Osteoarthritis   . Osteoporosis 03/22/2016  . Primary osteoarthritis of both hands 05/03/2016      Surgical History:  Past Surgical History:  Procedure Laterality Date  . ABDOMINAL HYSTERECTOMY    . CATARACT EXTRACTION    . endaryerectomy    . renal stenting       Home Meds: Prior to Admission medications   Medication Sig Start Date End Date Taking? Authorizing Provider  albuterol  (PROVENTIL HFA;VENTOLIN HFA) 108 (90 Base) MCG/ACT inhaler Inhale 2 puffs into the lungs every 6 (six) hours as needed for wheezing or shortness of breath. 08/29/16  Yes Arnetha Courser, MD  aspirin 81 MG tablet Take 81 mg by mouth daily.   Yes Historical Provider, MD  clopidogrel (PLAVIX) 75 MG tablet Take 1 tablet (75 mg total) by mouth daily. 06/13/16  Yes Arnetha Courser, MD  co-enzyme Q-10 30 MG capsule Take 30 mg by mouth 3 (three) times daily.   Yes Historical Provider, MD  Glycopyrrolate-Formoterol (BEVESPI AEROSPHERE) 9-4.8 MCG/ACT AERO Inhale 2 puffs into the lungs 2 (two) times daily. 08/29/16  Yes Arnetha Courser, MD  HYDROcodone-acetaminophen (NORCO) 10-325 MG tablet Take 0.5-1 tablets by mouth every 6 (six) hours as needed. 08/29/16  Yes Arnetha Courser, MD  metoprolol succinate (TOPROL-XL) 50 MG 24 hr tablet Take 1 tablet (50 mg total) by mouth daily. Take with or immediately following a meal. 05/30/16  Yes Arnetha Courser, MD  pantoprazole (PROTONIX) 40 MG tablet TAKE ONE TABLET AT BEDTIME 03/16/16  Yes Ashok Norris, MD  rosuvastatin (CRESTOR) 5 MG tablet Take 1 tablet (5 mg total) by mouth at bedtime. 07/13/16  Yes Arnetha Courser, MD  tiotropium (SPIRIVA HANDIHALER) 18 MCG inhalation capsule Place 1 capsule (18 mcg total) into inhaler and inhale daily. 07/04/16  Yes Arnetha Courser, MD    Inpatient Medications:  . acetaminophen  1,000 mg Oral Once  . aspirin EC  81 mg Oral Daily  . azithromycin  500 mg Oral Daily  . cefTRIAXone  1 g Intravenous Q24H  . clopidogrel  75 mg Oral  Daily  . docusate sodium  100 mg Oral BID  . guaiFENesin  600 mg Oral BID  . heparin  5,000 Units Subcutaneous Q8H  . insulin aspart  0-9 Units Subcutaneous TID WC  . irbesartan  150 mg Oral Daily  . levalbuterol  0.63 mg Nebulization Q6H  . methylPREDNISolone (SOLU-MEDROL) injection  60 mg Intravenous Q12H  . metoprolol succinate  50 mg Oral Daily  . mometasone-formoterol  2 puff Inhalation BID  . pantoprazole   40 mg Oral QHS  . rosuvastatin  5 mg Oral QHS  . sodium chloride flush  3 mL Intravenous Q12H  . tiotropium  18 mcg Inhalation Daily   . sodium chloride 50 mL/hr at 09/03/16 2030    Allergies: No Known Allergies  Social History   Social History  . Marital status: Widowed    Spouse name: N/A  . Number of children: N/A  . Years of education: N/A   Occupational History  . Not on file.   Social History Main Topics  . Smoking status: Current Every Day Smoker    Packs/day: 0.20    Types: Cigarettes  . Smokeless tobacco: Never Used  . Alcohol use No  . Drug use: No  . Sexual activity: No   Other Topics Concern  . Not on file   Social History Narrative  . No narrative on file     Family History  Problem Relation Age of Onset  . Cancer Mother   . Coronary artery disease Father   . Cerebrovascular Accident Father      Review of Systems Positive for Sinus tachycardia with nonspecific ST changes Negative for: General:  chills, fever, night sweats or weight changes.  Cardiovascular: PND orthopnea syncope dizziness  Dermatological skin lesions rashes Respiratory: Positive for Cough congestion Urologic: Frequent urination urination at night and hematuria Abdominal: negative for nausea, vomiting, diarrhea, bright red blood per rectum, melena, or hematemesis Neurologic: negative for visual changes, and/or hearing changes  All other systems reviewed and are otherwise negative except as noted above.  Labs:  Recent Labs  09/02/16 1034 09/02/16 1714 09/02/16 2324 09/03/16 0601  TROPONINI 0.56* 0.52* 0.42* 0.34*   Lab Results  Component Value Date   WBC 7.6 09/03/2016   HGB 12.7 09/03/2016   HCT 37.9 09/03/2016   MCV 100.4 (H) 09/03/2016   PLT 156 09/03/2016    Recent Labs Lab 09/03/16 0601  NA 141  K 4.2  CL 109  CO2 25  BUN 17  CREATININE 0.66  CALCIUM 8.6*  PROT 7.1  BILITOT 0.6  ALKPHOS 85  ALT 16  AST 31  GLUCOSE 155*   Lab Results   Component Value Date   CHOL 133 08/29/2016   HDL 45 (L) 08/29/2016   LDLCALC 39 08/29/2016   TRIG 244 (H) 08/29/2016   No results found for: DDIMER  Radiology/Studies:  Dg Chest 2 View  Result Date: 09/03/2016 CLINICAL DATA:  Community-acquired pneumonia EXAM: CHEST  2 VIEW COMPARISON:  Chest radiograph from one day prior. FINDINGS: Stable cardiomediastinal silhouette with normal heart size and aortic atherosclerosis. No pneumothorax. Increased trace bilateral pleural effusions. Hyperinflated lungs. Stable biapical pleural-parenchymal scarring. Patchy bibasilar lung opacities appear stable. Hazy opacity in the peripheral upper right lung appears slightly increased. IMPRESSION: 1. Nonspecific patchy bibasilar lung opacities are stable. Hazy opacity in the peripheral right upper lung appears slightly increased. These findings could represent multilobar pneumonia, with underlying interstitial lung disease not excluded. 2. Increased trace bilateral pleural effusions. 3.  Hyperinflated lungs, suggesting COPD. 4. Aortic atherosclerosis. Electronically Signed   By: Ilona Sorrel M.D.   On: 09/03/2016 08:31   Dg Chest 2 View  Result Date: 09/02/2016 CLINICAL DATA:  Shortness of breath EXAM: CHEST  2 VIEW COMPARISON:  None. FINDINGS: Top-normal heart size. Aortic atherosclerosis. Otherwise normal mediastinal contour. No pneumothorax. No pleural effusion. Hyperinflated lungs. Peripheral basilar predominant reticular opacities in both lungs. No acute consolidative airspace disease. IMPRESSION: 1. Peripheral basilar predominant reticular opacities in both lungs, most consistent with interstitial lung disease. 2. Hyperinflated lungs suggest obstructive lung disease. 3. Aortic atherosclerosis . Electronically Signed   By: Ilona Sorrel M.D.   On: 09/02/2016 12:25    EKG: Sinus tachycardia with nonspecific ST changes  Weights: Filed Weights   09/02/16 1622 09/03/16 0640 09/04/16 0500  Weight: 51.3 kg (113  lb) 53.3 kg (117 lb 9 oz) 53.1 kg (117 lb)     Physical Exam: Blood pressure 135/60, pulse (!) 115, temperature 98.4 F (36.9 C), temperature source Oral, resp. rate 18, height 5\' 3"  (1.6 m), weight 53.1 kg (117 lb), SpO2 91 %. Body mass index is 20.73 kg/m. General: Well developed, well nourished, in no acute distress. Head eyes ears nose throat: Normocephalic, atraumatic, sclera non-icteric, no xanthomas, nares are without discharge. No apparent thyromegaly and/or mass  Lungs: Normal respiratory effort. Few wheezes, no rales, Some rhonchi.  Heart: RRR with normal S1 S2. no murmur gallop, no rub, PMI is normal size and placement, carotid upstroke normal without bruit, jugular venous pressure is normal Abdomen: Soft, non-tender, non-distended with normoactive bowel sounds. No hepatomegaly. No rebound/guarding. No obvious abdominal masses. Abdominal aorta is normal size without bruit Extremities: No edema. no cyanosis, no clubbing, no ulcers  Peripheral : 2+ bilateral upper extremity pulses, 2+ bilateral femoral pulses, 2+ bilateral dorsal pedal pulse Neuro: Alert and oriented. No facial asymmetry. No focal deficit. Moves all extremities spontaneously. Musculoskeletal: Normal muscle tone without kyphosis Psych:  Responds to questions appropriately with a normal affect.    Assessment: 82 year old female with chronic obstructive pulmonary disease, essential hypertension, mixed hyperlipidemia, peripheral vascular disease status post endarterectomy having acute chest pain, shortness of breath and elevated troponin consistent with demand ischemia and right lower lobe pneumonia, slowly improving  Plan: 1. Continue supportive care for shortness of breath, chest pain and right lower lobe pneumonia with antibiotics and inhalers. 2. No further cardiac workup of chest discomfort and shortness of breath, most consistent with pneumonia. 3. No further intervention of the minimal elevation of troponin  consistent with demand ischemia. 4. Continue antihypertension medication management for a goal blood pressure below 130 mm with metoprolol 5. High intensity cholesterol therapy with a Crestor. 6. No change in antiplatelet medication management. 4. Previous peripheral vascular disease. 7. No further cardiac diagnostics are necessary at this time  Signed, Corey Skains M.D. [facility] 09/04/2016, 12:25 PM

## 2016-09-05 LAB — GLUCOSE, CAPILLARY
GLUCOSE-CAPILLARY: 162 mg/dL — AB (ref 65–99)
GLUCOSE-CAPILLARY: 271 mg/dL — AB (ref 65–99)

## 2016-09-05 MED ORDER — GUAIFENESIN-DM 100-10 MG/5ML PO SYRP: 5.0000 mL | ORAL_SOLUTION | ORAL | 0 refills | Status: DC | PRN

## 2016-09-05 MED ORDER — CEFUROXIME AXETIL 500 MG PO TABS: 500.0000 mg | ORAL_TABLET | Freq: Two times a day (BID) | ORAL | 0 refills | Status: DC

## 2016-09-05 MED ORDER — GUAIFENESIN-DM 100-10 MG/5ML PO SYRP: 5.0000 mL | ORAL_SOLUTION | Freq: Four times a day (QID) | ORAL | 0 refills | Status: DC | PRN

## 2016-09-05 MED ORDER — PREDNISONE 10 MG (21) PO TBPK: 10.0000 mg | ORAL_TABLET | Freq: Every day | ORAL | 0 refills | Status: DC

## 2016-09-05 MED ORDER — GUAIFENESIN ER 600 MG PO TB12: 600.0000 mg | ORAL_TABLET | Freq: Two times a day (BID) | ORAL | 0 refills | Status: DC

## 2016-09-05 MED ORDER — AZITHROMYCIN 500 MG PO TABS: 500.0000 mg | ORAL_TABLET | Freq: Every day | ORAL | 0 refills | Status: DC

## 2016-09-05 NOTE — Care Management (Signed)
Admitted to [facility] with the diagnois of sepsis. Lives alone. Daughter is Benjamine Mola [phone]). Last seen Dr. Sanda Klein 2 weeks ago. No home health. No skilled facility. No home oxygen in the past. Uses no aids for ambulation. Fair appetite. No falls. Prescriptions are filled at [facility]. Takes care of all basic and instrumental activities of daily living herself, drives. Daughter will transport. Qualifies for home oxygen. Discussed equipment companies., Agreed to [place]. Floydene Flock, [facility] representative updated.  Discharge to home today per Dr. Vianne Bulls. Shelbie Ammons RN MSN CCM Care Management [phone]

## 2016-09-05 NOTE — Progress Notes (Signed)
SATURATION QUALIFICATIONS: (This note is used to comply with regulatory documentation for home oxygen)  Patient Saturations on Room Air at Rest = 85%  Patient Saturations on Room Air while Ambulating = 80%  Patient Saturations on 2 Liters of oxygen while Ambulating = 93%  Please briefly explain why patient needs home oxygen: COPD

## 2016-09-05 NOTE — Progress Notes (Signed)
Discharge paperwork with new prescriptions reviewed with patient who verbalized understanding. All prescriptions sent electronically, Robitussin attached to discharge paperwork. Patient's daughter to transport home. Waiting for home oxygen to be delivered.

## 2016-09-06 NOTE — Discharge Summary (Signed)
Samantha Dawson, is a 82 y.o. female  DOB 06/10/1929  MRN [id number].  Admission date:  09/02/2016  Admitting Physician  Idelle Crouch, MD  Discharge Date:  09/06/2016   Primary MD  Ashok Norris, MD  Recommendations for primary care physician for things to follow:   Follow-up with primary doctor in 1 week   Admission Diagnosis  CAP (community acquired pneumonia) [J18.9] Sepsis, due to unspecified organism ([place]) [A41.9] Community acquired pneumonia of right lower lobe of lung ([place]) [J18.1]   Discharge Diagnosis  CAP (community acquired pneumonia) [J18.9] Sepsis, due to unspecified organism ([place]) [A41.9] Community acquired pneumonia of right lower lobe of lung ([place]) [J18.1]    Principal Problem:   Sepsis ([place]) Active Problems:   Panlobular emphysema ([place])   CAP (community acquired pneumonia)   Hyperglycemia      Past Medical History:  Diagnosis Date  . Anxiety   . Controlled substance agreement signed 03/22/2016  . 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 Encounter for chronic pain management   . Hyperlipidemia   . Hypertension   . Osteoarthritis   . Osteoporosis 03/22/2016  . Primary osteoarthritis of both hands 05/03/2016    Past Surgical History:  Procedure Laterality Date  . ABDOMINAL HYSTERECTOMY    . CATARACT EXTRACTION    . endaryerectomy    . renal stenting         History of present illness and  Hospital Course:     Kindly see H&P for history of present illness and admission details, please review complete Labs, Consult reports and Test reports for all details in brief  HPI  from the history and physical done on the day of admission 82 year old female patient history of COPD, essential hypertension, chronic pain admitted because of shortness of breath, cough and found to have  right lower lobe pneumonia. Admitted for sepsis secondary to pneumonia.   Hospital Course  #1 sepsis, community-acquired pneumonia: Patient started on Rocephin, Zithromax, IV hydration. Patient symptoms improved, blood cultures have been negative. Discharge home with Augmentin, Zithromax for pneumonia treatment. #2 COPD exacerbation: Patient did have a lot of wheezing, started on nebulizers, steroids. Because of tachycardia change at the nebulizers with Xopenex. Wheezing improved. Discharged home with tapering course of prednisone. #3 acute respiratory failure with hypoxia secondary to pneumonia and COPD exacerbation. Patient oxygen saturation while ambulating on room air 80%, at rest 85%, on 2 L saturation 93%. Discharge home with 2 L of oxygen. Advised the patient to follow up on this with primary doctor and maybe have a pulmonary doctor appointment to have PFTs and see for ongoing need for oxygen.  #3 slightly elevated troponins: She did not have chest pain. Troponins were 0.56, 0.52, 0.42, 0.34. Seen by cardiology, Dr. Nehemiah Massed saw the patient,  Pt had echocardiogram. Did not have any further cardiac workup, troponin elevation is secondary to respiratory distress, sepsis. Discharged home with aspirin, plavix,statins, beta blockers.   Discharge Condition: stable   Follow UP  Follow-up Information    Ashok Norris, MD On 09/08/2016.   Specialty:  Family Medicine Why:  @8 :40am for follow-up appointment. Contact information: [address] [place] [address] [phone]             Discharge Instructions  and  Discharge Medications     Discharge Instructions    For home use only DME oxygen    Complete by:  As directed    Mode or (Route):  Nasal cannula  Liters per Minute:  2   Frequency:  Continuous (stationary and portable oxygen unit needed)   Oxygen conserving device:  Yes   Oxygen delivery system:  Gas       Medication List    STOP taking these  medications   HYDROcodone-acetaminophen 10-325 MG tablet Commonly known as:  NORCO     TAKE these medications   albuterol 108 (90 Base) MCG/ACT inhaler Commonly known as:  PROVENTIL HFA;VENTOLIN HFA Inhale 2 puffs into the lungs every 6 (six) hours as needed for wheezing or shortness of breath.   aspirin 81 MG tablet Take 81 mg by mouth daily.   azithromycin 500 MG tablet Commonly known as:  ZITHROMAX Take 1 tablet (500 mg total) by mouth daily.   cefUROXime 500 MG tablet Commonly known as:  CEFTIN Take 1 tablet (500 mg total) by mouth 2 (two) times daily with a meal.   clopidogrel 75 MG tablet Commonly known as:  PLAVIX Take 1 tablet (75 mg total) by mouth daily.   co-enzyme Q-10 30 MG capsule Take 30 mg by mouth 3 (three) times daily.   Glycopyrrolate-Formoterol 9-4.8 MCG/ACT Aero Commonly known as:  BEVESPI AEROSPHERE Inhale 2 puffs into the lungs 2 (two) times daily.   guaiFENesin 600 MG 12 hr tablet Commonly known as:  MUCINEX Take 1 tablet (600 mg total) by mouth 2 (two) times daily.   guaiFENesin-dextromethorphan 100-10 MG/5ML syrup Commonly known as:  ROBITUSSIN DM Take 5 mLs by mouth every 6 (six) hours as needed for cough (chest congestion).   metoprolol succinate 50 MG 24 hr tablet Commonly known as:  TOPROL-XL Take 1 tablet (50 mg total) by mouth daily. Take with or immediately following a meal.   pantoprazole 40 MG tablet Commonly known as:  PROTONIX TAKE ONE TABLET AT BEDTIME   predniSONE 10 MG (21) Tbpk tablet Commonly known as:  STERAPRED UNI-PAK 21 TAB Take 1 tablet (10 mg total) by mouth daily. Taper by 10 mg daily   rosuvastatin 5 MG tablet Commonly known as:  CRESTOR Take 1 tablet (5 mg total) by mouth at bedtime.   tiotropium 18 MCG inhalation capsule Commonly known as:  SPIRIVA HANDIHALER Place 1 capsule (18 mcg total) into inhaler and inhale daily.         Diet and Activity recommendation: See Discharge Instructions  above   Consults obtained - Cardiology   Major procedures and Radiology Reports - PLEASE review detailed and final reports for all details, in brief -     Dg Chest 2 View  Result Date: 09/03/2016 CLINICAL DATA:  Community-acquired pneumonia EXAM: CHEST  2 VIEW COMPARISON:  Chest radiograph from one day prior. FINDINGS: Stable cardiomediastinal silhouette with normal heart size and aortic atherosclerosis. No pneumothorax. Increased trace bilateral pleural effusions. Hyperinflated lungs. Stable biapical pleural-parenchymal scarring. Patchy bibasilar lung opacities appear stable. Hazy opacity in the peripheral upper right lung appears slightly increased. IMPRESSION: 1. Nonspecific patchy bibasilar lung opacities are stable. Hazy opacity in the peripheral right upper lung appears slightly increased. These findings could represent multilobar pneumonia, with underlying interstitial lung disease not excluded. 2. Increased trace bilateral pleural effusions. 3. Hyperinflated lungs, suggesting COPD. 4. Aortic atherosclerosis. Electronically Signed   By: Ilona Sorrel M.D.   On: 09/03/2016 08:31   Dg Chest 2 View  Result Date: 09/02/2016 CLINICAL DATA:  Shortness of breath EXAM: CHEST  2 VIEW COMPARISON:  None. FINDINGS: Top-normal heart size. Aortic atherosclerosis. Otherwise normal mediastinal contour. No pneumothorax. No pleural effusion.  Hyperinflated lungs. Peripheral basilar predominant reticular opacities in both lungs. No acute consolidative airspace disease. IMPRESSION: 1. Peripheral basilar predominant reticular opacities in both lungs, most consistent with interstitial lung disease. 2. Hyperinflated lungs suggest obstructive lung disease. 3. Aortic atherosclerosis . Electronically Signed   By: Ilona Sorrel M.D.   On: 09/02/2016 12:25    Micro Results     Recent Results (from the past 240 hour(s))  Culture, blood (Routine x 2)     Status: None (Preliminary result)   Collection Time: 09/02/16  10:34 AM  Result Value Ref Range Status   Specimen Description BLOOD LEFT FOREARM  Final   Special Requests BOTTLES DRAWN AEROBIC AND ANAEROBIC  8CC  Final   Culture NO GROWTH 4 DAYS  Final   Report Status PENDING  Incomplete  Culture, blood (Routine x 2)     Status: None (Preliminary result)   Collection Time: 09/02/16 10:52 AM  Result Value Ref Range Status   Specimen Description BLOOD RIGHT HAND  Final   Special Requests BOTTLES DRAWN AEROBIC AND ANAEROBIC  [place]  Final   Culture NO GROWTH 4 DAYS  Final   Report Status PENDING  Incomplete  Urine culture     Status: Abnormal   Collection Time: 09/02/16 10:52 AM  Result Value Ref Range Status   Specimen Description URINE, RANDOM  Final   Special Requests NONE  Final   Culture MULTIPLE SPECIES PRESENT, SUGGEST RECOLLECTION (A)  Final   Report Status 09/03/2016 FINAL  Final  C difficile quick screen w PCR reflex     Status: None   Collection Time: 09/03/16  1:19 PM  Result Value Ref Range Status   C Diff antigen NEGATIVE NEGATIVE Final   C Diff toxin NEGATIVE NEGATIVE Final   C Diff interpretation No C. difficile detected.  Final       Today   Subjective:   Randall An today has no headache,no chest abdominal pain,no new weakness tingling or numbness, feels much better wants to go home today.   Objective:   Blood pressure (!) 142/70, pulse 78, temperature 98.4 F (36.9 C), temperature source Oral, resp. rate 18, height 5\' 3"  (1.6 m), weight 49.9 kg (110 lb), SpO2 95 %.  No intake or output data in the 24 hours ending 09/06/16 1920  Exam Awake Alert, Oriented x 3, No new F.N deficits, Normal affect [place].AT,PERRAL Supple Neck,No JVD, No cervical lymphadenopathy appriciated.  Symmetrical Chest wall movement, Good air movement bilaterally, CTAB RRR,No Gallops,Rubs or new Murmurs, No Parasternal Heave +ve B.Sounds, Abd Soft, Non tender, No organomegaly appriciated, No rebound -guarding or rigidity. No Cyanosis, Clubbing or  edema, No new Rash or bruise  Data Review   CBC w Diff:  Lab Results  Component Value Date   WBC 7.6 09/03/2016   HGB 12.7 09/03/2016   HCT 37.9 09/03/2016   PLT 156 09/03/2016   LYMPHOPCT 7 09/02/2016   MONOPCT 17 09/02/2016   EOSPCT 0 09/02/2016   BASOPCT 0 09/02/2016    CMP:  Lab Results  Component Value Date   NA 141 09/03/2016   NA 144 12/02/2015   K 4.2 09/03/2016   CL 109 09/03/2016   CO2 25 09/03/2016   BUN 17 09/03/2016   BUN 13 12/02/2015   CREATININE 0.66 09/03/2016   CREATININE 0.99 (H) 08/29/2016   PROT 7.1 09/03/2016   PROT 7.2 12/02/2015   ALBUMIN 3.1 (L) 09/03/2016   ALBUMIN 4.3 12/02/2015   BILITOT 0.6 09/03/2016  BILITOT 0.7 12/02/2015   ALKPHOS 85 09/03/2016   AST 31 09/03/2016   ALT 16 09/03/2016  .   Total Time in preparing paper work, data evaluation and todays exam - 82 minutes  Resha Filippone M.D on 09/05/2016 at 7:20 PM    Note: This dictation was prepared with Dragon dictation along with smaller phrase technology. Any transcriptional errors that result from this process are unintentional.

## 2016-09-07 LAB — CULTURE, BLOOD (ROUTINE X 2)
CULTURE: NO GROWTH
Culture: NO GROWTH

## 2016-09-08 ENCOUNTER — Encounter: Payer: Self-pay | Admitting: Family Medicine

## 2016-09-08 ENCOUNTER — Ambulatory Visit (INDEPENDENT_AMBULATORY_CARE_PROVIDER_SITE_OTHER): Payer: Medicare Other | Admitting: Family Medicine

## 2016-09-08 DIAGNOSIS — Z720 Tobacco use: Secondary | ICD-10-CM

## 2016-09-08 DIAGNOSIS — F1190 Opioid use, unspecified, uncomplicated: Secondary | ICD-10-CM | POA: Diagnosis not present

## 2016-09-08 DIAGNOSIS — A403 Sepsis due to Streptococcus pneumoniae: Secondary | ICD-10-CM

## 2016-09-08 DIAGNOSIS — J189 Pneumonia, unspecified organism: Secondary | ICD-10-CM

## 2016-09-08 DIAGNOSIS — J181 Lobar pneumonia, unspecified organism: Secondary | ICD-10-CM

## 2016-09-08 DIAGNOSIS — R0902 Hypoxemia: Secondary | ICD-10-CM | POA: Diagnosis not present

## 2016-09-08 HISTORY — DX: Opioid use, unspecified, uncomplicated: F11.90

## 2016-09-08 NOTE — Progress Notes (Signed)
BP 110/68   Pulse 83   Temp 98.2 F (36.8 C)   Resp 14   Wt 110 lb 3 oz (50 kg)   SpO2 92%   BMI 19.52 kg/m    Subjective:    Patient ID: Samantha Dawson, female    DOB: [birth year], 82 y.o.   MRN: [id number]  HPI: Samantha Dawson is a 82 y.o. female  Chief Complaint  Patient presents with  . Follow-up    HOSP FU; Sepsis   She was feeling bad last Friday evening; daughter called other daughter Saturday morning, and by then she felt so bad she had to go; couldn't breathe; coughing a lot; went to the ER and diagnosed with pneumonia and sepsis Diagnosed with RLL pneumonia Sent home on azithromycin and cefuroxime and prednisone taper No fevers since leaving hospital Now has oxygen at home; currently not on oxygen during the visit Her sugars went up in the hospital from the steroids; having some dry mouth troponins were elevated, Dr. Nehemiah Massed consulted, just strain from respiratory condition They stopped her hydrocodone at discharge, but doctor told her she was mistaken and to go ahead and take it at discharge; patient doesn't look groggy or somnolent The prednisone has not made her angry or have insomnia or irritabler She feels like she is getting stronger --------------------------------------------- Admission Diagnosis  CAP (community acquired pneumonia) [J18.9] Sepsis, due to unspecified organism ([place]) [A41.9] Community acquired pneumonia of right lower lobe of lung ([place]) [J18.1]   Discharge Diagnosis  CAP (community acquired pneumonia) [J18.9] Sepsis, due to unspecified organism ([place]) [A41.9] Community acquired pneumonia of right lower lobe of lung ([place]) [J18.1]    Principal Problem:   Sepsis ([place]) Active Problems:   Panlobular emphysema ([place])   CAP (community acquired pneumonia)   Hyperglycemia  IMPRESSION: 1. Nonspecific patchy bibasilar lung opacities are stable. Hazy opacity in the peripheral right upper lung appears slightly increased. These findings could  represent multilobar pneumonia, with underlying interstitial lung disease not excluded. 2. Increased trace bilateral pleural effusions. 3. Hyperinflated lungs, suggesting COPD. 4. Aortic atherosclerosis.  Electronically Signed   By: Ilona Sorrel M.D.   On: 09/03/2016 08:31 ------------------------------------------------ Depression screen [facility] 2/9 09/08/2016 08/29/2016 05/03/2016 10/25/2015 05/25/2015  Decreased Interest 0 0 0 0 0  Down, Depressed, Hopeless 0 0 0 0 0  PHQ - 2 Score 0 0 0 0 0   Relevant past medical, surgical, family and social history reviewed Past Medical History:  Diagnosis Date  . Anxiety   . Controlled substance agreement signed 03/22/2016  . 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 Encounter for chronic pain management   . Hyperlipidemia   . Hypertension   . Osteoarthritis   . Osteoporosis 03/22/2016  . Primary osteoarthritis of both hands 05/03/2016   Past Surgical History:  Procedure Laterality Date  . ABDOMINAL HYSTERECTOMY    . CATARACT EXTRACTION    . endaryerectomy    . renal stenting     Family History  Problem Relation Age of Onset  . Cancer Mother   . Coronary artery disease Father   . Cerebrovascular Accident Father    Social History  Substance Use Topics  . Smoking status: Former Smoker    Packs/day: 0.20    Types: Cigarettes  . Smokeless tobacco: Never Used     Comment: Haven't smoke since she went to the [facility]  . Alcohol use No   Interim medical history since last visit reviewed. Allergies and medications reviewed  Review  of Systems Per HPI unless specifically indicated above     Objective:    BP 110/68   Pulse 83   Temp 98.2 F (36.8 C)   Resp 14   Wt 110 lb 3 oz (50 kg)   SpO2 92%   BMI 19.52 kg/m   Wt Readings from Last 3 Encounters:  09/08/16 110 lb 3 oz (50 kg)  09/05/16 110 lb (49.9 kg)  08/29/16 113 lb (51.3 kg)  Pulse ox after one lap around the office on room air dropped to 85%  Physical Exam    Constitutional: She appears well-developed and well-nourished. No distress.  Eyes: EOM are normal. No scleral icterus.  Neck: No thyromegaly present.  Cardiovascular: Normal rate and regular rhythm.   Pulmonary/Chest: Effort normal and breath sounds normal. No respiratory distress. She has no wheezes. She has no rales.  Abdominal: She exhibits no distension.  Musculoskeletal: She exhibits no edema.  Skin: Skin is warm and dry. No erythema. No pallor.  Psychiatric: She has a normal mood and affect. Her behavior is normal. Judgment and thought content normal.   Results for orders placed or performed during the hospital encounter of 09/02/16  Culture, blood (Routine x 2)  Result Value Ref Range   Specimen Description BLOOD LEFT FOREARM    Special Requests BOTTLES DRAWN AEROBIC AND ANAEROBIC  8CC    Culture NO GROWTH 5 DAYS    Report Status 09/07/2016 FINAL   Culture, blood (Routine x 2)  Result Value Ref Range   Specimen Description BLOOD RIGHT HAND    Special Requests BOTTLES DRAWN AEROBIC AND ANAEROBIC  [place]    Culture NO GROWTH 5 DAYS    Report Status 09/07/2016 FINAL   Urine culture  Result Value Ref Range   Specimen Description URINE, RANDOM    Special Requests NONE    Culture MULTIPLE SPECIES PRESENT, SUGGEST RECOLLECTION (A)    Report Status 09/03/2016 FINAL   C difficile quick screen w PCR reflex  Result Value Ref Range   C Diff antigen NEGATIVE NEGATIVE   C Diff toxin NEGATIVE NEGATIVE   C Diff interpretation No C. difficile detected.   Comprehensive metabolic panel  Result Value Ref Range   Sodium 135 135 - 145 mmol/L   Potassium 4.2 3.5 - 5.1 mmol/L   Chloride 101 101 - 111 mmol/L   CO2 24 22 - 32 mmol/L   Glucose, Bld 161 (H) 65 - 99 mg/dL   BUN 25 (H) 6 - 20 mg/dL   Creatinine, Ser 0.91 0.44 - 1.00 mg/dL   Calcium 9.0 8.9 - 10.3 mg/dL   Total Protein 7.7 6.5 - 8.1 g/dL   Albumin 3.5 3.5 - 5.0 g/dL   AST 34 15 - 41 U/L   ALT 13 (L) 14 - 54 U/L   Alkaline  Phosphatase 92 38 - 126 U/L   Total Bilirubin 1.1 0.3 - 1.2 mg/dL   GFR calc non Af Amer 56 (L) >60 mL/min   GFR calc Af Amer >60 >60 mL/min   Anion gap 10 5 - 15  Lactic acid, plasma  Result Value Ref Range   Lactic Acid, Venous 1.1 0.5 - 1.9 mmol/L  Lactic acid, plasma  Result Value Ref Range   Lactic Acid, Venous 1.4 0.5 - 1.9 mmol/L  CBC with Differential  Result Value Ref Range   WBC 12.2 (H) 3.6 - 11.0 K/uL   RBC 4.02 3.80 - 5.20 MIL/uL   Hemoglobin 13.4 12.0 - 16.0 g/dL  HCT 39.3 35.0 - 47.0 %   MCV 97.7 80.0 - 100.0 fL   MCH 33.4 26.0 - 34.0 pg   MCHC 34.2 32.0 - 36.0 g/dL   RDW 13.7 11.5 - 14.5 %   Platelets 145 (L) 150 - 440 K/uL   Neutrophils Relative % 76 %   Neutro Abs 9.2 (H) 1.4 - 6.5 K/uL   Lymphocytes Relative 7 %   Lymphs Abs 0.8 (L) 1.0 - 3.6 K/uL   Monocytes Relative 17 %   Monocytes Absolute 2.1 (H) 0.2 - 0.9 K/uL   Eosinophils Relative 0 %   Eosinophils Absolute 0.0 0 - 0.7 K/uL   Basophils Relative 0 %   Basophils Absolute 0.0 0 - 0.1 K/uL  Urinalysis complete, with microscopic  Result Value Ref Range   Color, Urine YELLOW (A) YELLOW   APPearance CLEAR (A) CLEAR   Glucose, UA NEGATIVE NEGATIVE mg/dL   Bilirubin Urine NEGATIVE NEGATIVE   Ketones, ur NEGATIVE NEGATIVE mg/dL   Specific Gravity, Urine 1.010 1.005 - 1.030   Hgb urine dipstick 1+ (A) NEGATIVE   pH 6.0 5.0 - 8.0   Protein, ur 30 (A) NEGATIVE mg/dL   Nitrite NEGATIVE NEGATIVE   Leukocytes, UA NEGATIVE NEGATIVE   RBC / HPF 0-5 0 - 5 RBC/hpf   WBC, UA 0-5 0 - 5 WBC/hpf   Bacteria, UA RARE (A) NONE SEEN   Squamous Epithelial / LPF 0-5 (A) NONE SEEN  Troponin I  Result Value Ref Range   Troponin I 0.56 (HH) <0.03 ng/mL  Influenza panel by PCR (type A & B, H1N1)  Result Value Ref Range   Influenza A By PCR NEGATIVE NEGATIVE   Influenza B By PCR NEGATIVE NEGATIVE   H1N1 flu by pcr NOT DETECTED NOT DETECTED  Legionella Pneumophila Serogp 1 Ur Ag  Result Value Ref Range   L.  pneumophila Serogp 1 Ur Ag Negative Negative   Source of Sample URINE, RANDOM   Glucose, capillary  Result Value Ref Range   Glucose-Capillary 136 (H) 65 - 99 mg/dL  Troponin I (q 6hr x 3)  Result Value Ref Range   Troponin I 0.52 (HH) <0.03 ng/mL  Troponin I (q 6hr x 3)  Result Value Ref Range   Troponin I 0.42 (HH) <0.03 ng/mL  Troponin I (q 6hr x 3)  Result Value Ref Range   Troponin I 0.34 (HH) <0.03 ng/mL  Comprehensive metabolic panel  Result Value Ref Range   Sodium 141 135 - 145 mmol/L   Potassium 4.2 3.5 - 5.1 mmol/L   Chloride 109 101 - 111 mmol/L   CO2 25 22 - 32 mmol/L   Glucose, Bld 155 (H) 65 - 99 mg/dL   BUN 17 6 - 20 mg/dL   Creatinine, Ser 0.66 0.44 - 1.00 mg/dL   Calcium 8.6 (L) 8.9 - 10.3 mg/dL   Total Protein 7.1 6.5 - 8.1 g/dL   Albumin 3.1 (L) 3.5 - 5.0 g/dL   AST 31 15 - 41 U/L   ALT 16 14 - 54 U/L   Alkaline Phosphatase 85 38 - 126 U/L   Total Bilirubin 0.6 0.3 - 1.2 mg/dL   GFR calc non Af Amer >60 >60 mL/min   GFR calc Af Amer >60 >60 mL/min   Anion gap 7 5 - 15  CBC  Result Value Ref Range   WBC 7.6 3.6 - 11.0 K/uL   RBC 3.77 (L) 3.80 - 5.20 MIL/uL   Hemoglobin 12.7 12.0 -  16.0 g/dL   HCT 37.9 35.0 - 47.0 %   MCV 100.4 (H) 80.0 - 100.0 fL   MCH 33.6 26.0 - 34.0 pg   MCHC 33.5 32.0 - 36.0 g/dL   RDW 13.5 11.5 - 14.5 %   Platelets 156 150 - 440 K/uL  Glucose, capillary  Result Value Ref Range   Glucose-Capillary 153 (H) 65 - 99 mg/dL   Comment 1 Notify RN    Comment 2 Document in Chart   Glucose, capillary  Result Value Ref Range   Glucose-Capillary 200 (H) 65 - 99 mg/dL   Comment 1 Notify RN    Comment 2 Document in Chart   Glucose, capillary  Result Value Ref Range   Glucose-Capillary 205 (H) 65 - 99 mg/dL   Comment 1 Notify RN    Comment 2 Document in Chart   Glucose, capillary  Result Value Ref Range   Glucose-Capillary 183 (H) 65 - 99 mg/dL  Glucose, capillary  Result Value Ref Range   Glucose-Capillary 165 (H) 65 - 99  mg/dL  Procalcitonin - Baseline  Result Value Ref Range   Procalcitonin 0.17 ng/mL  Glucose, capillary  Result Value Ref Range   Glucose-Capillary 164 (H) 65 - 99 mg/dL  Glucose, capillary  Result Value Ref Range   Glucose-Capillary 167 (H) 65 - 99 mg/dL  Glucose, capillary  Result Value Ref Range   Glucose-Capillary 157 (H) 65 - 99 mg/dL  Glucose, capillary  Result Value Ref Range   Glucose-Capillary 162 (H) 65 - 99 mg/dL  Glucose, capillary  Result Value Ref Range   Glucose-Capillary 271 (H) 65 - 99 mg/dL  ECHOCARDIOGRAM COMPLETE  Result Value Ref Range   Weight 1,881 oz   Height 63 in   BP 118/67 mmHg      Assessment & Plan:   Problem List Items Addressed This Visit      Respiratory   CAP (community acquired pneumonia)    Follow-up CXR in about 2 weeks; finish out antibiotics; use O2 at 2 l/m; be very careful with oxygen tubing, so as to not get tripped up and fall which could cause fracture        Other   Uncomplicated opioid use    Neither patient nor her daughter think that the pain medicine sedates her; would have decreased this if I thought there was any drop in respiratory drive, but both of them think she is accustomed the medicine and is doing fine      Tobacco abuse    Patient has not smoked since going into the hospital; praise given; urged her to never pick up another cigarette      RESOLVED: Sepsis ([place])    Resolved; reviewed recent hospital notes      Hypoxemia    We walked one lap and she dropped to 85%; came back up with rest; advised patient to wear oxygen at 2 l/m; will reassess in a few weeks to see how she's doing; will also do spirometry when done with acute illness and consider referral to pulmonologist; pt did not want to see pulm just yet when referral offered       Other Visit Diagnoses   None.     Follow up plan: Return in about 4 weeks (around 10/06/2016).  An after-visit summary was printed and given to the patient at  [place].  Please see the patient instructions which may contain other information and recommendations beyond what is mentioned above in the assessment and plan.  Meds ordered this encounter  Medications  . valsartan (DIOVAN) 80 MG tablet

## 2016-09-08 NOTE — Patient Instructions (Addendum)
Let's get a follow-up chest xray around November 14th Until then, wear oxygen at 2 liters/minute Please don't ever touch another cigarette Call me with any issues

## 2016-09-08 NOTE — Assessment & Plan Note (Signed)
Neither patient nor her daughter think that the pain medicine sedates her; would have decreased this if I thought there was any drop in respiratory drive, but both of them think she is accustomed the medicine and is doing fine

## 2016-09-08 NOTE — Assessment & Plan Note (Addendum)
Follow-up CXR in about 2 weeks; finish out antibiotics; use O2 at 2 l/m; be very careful with oxygen tubing, so as to not get tripped up and fall which could cause fracture

## 2016-09-08 NOTE — Assessment & Plan Note (Addendum)
We walked one lap and she dropped to 85%; came back up with rest; advised patient to wear oxygen at 2 l/m; will reassess in a few weeks to see how she's doing; will also do spirometry when done with acute illness and consider referral to pulmonologist; pt did not want to see pulm just yet when referral offered

## 2016-09-08 NOTE — Assessment & Plan Note (Signed)
Patient has not smoked since going into the hospital; praise given; urged her to never pick up another cigarette

## 2016-09-08 NOTE — Assessment & Plan Note (Signed)
Resolved; reviewed recent hospital notes

## 2016-10-02 ENCOUNTER — Other Ambulatory Visit: Payer: Self-pay | Admitting: Family Medicine

## 2016-10-02 NOTE — Telephone Encounter (Signed)
Reviewed d/c summary from hospital I will have pharmacist instruct her to STOP her spiriva

## 2016-10-05 DIAGNOSIS — J449 Chronic obstructive pulmonary disease, unspecified: Secondary | ICD-10-CM | POA: Diagnosis not present

## 2016-10-09 ENCOUNTER — Encounter: Payer: Self-pay | Admitting: Family Medicine

## 2016-10-09 ENCOUNTER — Ambulatory Visit (INDEPENDENT_AMBULATORY_CARE_PROVIDER_SITE_OTHER): Payer: Medicare Other | Admitting: Family Medicine

## 2016-10-09 VITALS — BP 110/60 | HR 76 | Temp 98.3°F | Resp 16 | Ht 63.0 in | Wt 112.0 lb

## 2016-10-09 DIAGNOSIS — J181 Lobar pneumonia, unspecified organism: Secondary | ICD-10-CM | POA: Diagnosis not present

## 2016-10-09 DIAGNOSIS — J431 Panlobular emphysema: Secondary | ICD-10-CM | POA: Diagnosis not present

## 2016-10-09 DIAGNOSIS — Z87891 Personal history of nicotine dependence: Secondary | ICD-10-CM | POA: Diagnosis not present

## 2016-10-09 DIAGNOSIS — R0902 Hypoxemia: Secondary | ICD-10-CM | POA: Diagnosis not present

## 2016-10-09 DIAGNOSIS — K219 Gastro-esophageal reflux disease without esophagitis: Secondary | ICD-10-CM

## 2016-10-09 DIAGNOSIS — J189 Pneumonia, unspecified organism: Secondary | ICD-10-CM

## 2016-10-09 LAB — CBC WITH DIFFERENTIAL/PLATELET
BASOS PCT: 0 %
Basophils Absolute: 0 cells/uL (ref 0–200)
EOS ABS: 158 {cells}/uL (ref 15–500)
Eosinophils Relative: 2 %
HCT: 40.7 % (ref 35.0–45.0)
Hemoglobin: 13.3 g/dL (ref 11.7–15.5)
LYMPHS ABS: 2686 {cells}/uL (ref 850–3900)
Lymphocytes Relative: 34 %
MCH: 32.6 pg (ref 27.0–33.0)
MCHC: 32.7 g/dL (ref 32.0–36.0)
MCV: 99.8 fL (ref 80.0–100.0)
MONOS PCT: 10 %
MPV: 9.7 fL (ref 7.5–12.5)
Monocytes Absolute: 790 cells/uL (ref 200–950)
NEUTROS ABS: 4266 {cells}/uL (ref 1500–7800)
Neutrophils Relative %: 54 %
PLATELETS: 179 10*3/uL (ref 140–400)
RBC: 4.08 MIL/uL (ref 3.80–5.10)
RDW: 13.6 % (ref 11.0–15.0)
WBC: 7.9 10*3/uL (ref 3.8–10.8)

## 2016-10-09 MED ORDER — PANTOPRAZOLE SODIUM 40 MG PO TBEC: 40.0000 mg | DELAYED_RELEASE_TABLET | Freq: Every day | ORAL | 0 refills | Status: DC

## 2016-10-09 NOTE — Assessment & Plan Note (Signed)
Order f/u CXR

## 2016-10-09 NOTE — Patient Instructions (Addendum)
Please go across the street and have your chest xray done today; thank you Start back on the pantoprazole Avoid triggers Don't eat for 3 hours before bed

## 2016-10-09 NOTE — Progress Notes (Signed)
BP 110/60 (BP Location: Left Arm, Patient Position: Sitting, Cuff Size: Normal)   Pulse 76   Temp 98.3 F (36.8 C) (Oral)   Resp 16   Ht 5\' 3"  (1.6 m)   Wt 112 lb (50.8 kg)   SpO2 92%   BMI 19.84 kg/m   On room air; was 89% at check in  Subjective:    Patient ID: [name], female    DOB: 1929/01/17, 82 y.o.   MRN: [id number]  HPI: KIERSTA MECHLING is a 82 y.o. female  Chief Complaint  Patient presents with  . Follow-up    4 week    Patient is here for 4 week follow-up of pneumonia and hypoxia Hospitalized with RLL pneumonia on October 28th She saw me on Nov 3rd Was on oxygen, did not bring to the clinic today unfortunately She has not smoked since before she was admitted to the hospital in October  No bleeding on the plavix  She needs something for acid reflux; going on since oxygen; certain foods bother her; OJ is okay; tomato based are bad; BBQ sometimes; onions sometimes; coffee is okay; chocolate is okay; peppermint is okay  Depression screen [facility] 2/9 10/09/2016 09/08/2016 08/29/2016 05/03/2016 10/25/2015  Decreased Interest 0 0 0 0 0  Down, Depressed, Hopeless 0 0 0 0 0  PHQ - 2 Score 0 0 0 0 0   Relevant past medical, surgical, family and social history reviewed Past Medical History:  Diagnosis Date  . Acid reflux 10/15/2016  . Anxiety   . Controlled substance agreement signed 03/22/2016  . 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 Encounter for chronic pain management   . Hx of smoking 04/17/2016  . Hyperlipidemia   . Hypertension   . Osteoarthritis   . Osteoporosis 03/22/2016  . Primary osteoarthritis of both hands 05/03/2016   Past Surgical History:  Procedure Laterality Date  . ABDOMINAL HYSTERECTOMY    . CATARACT EXTRACTION    . endaryerectomy    . renal stenting     Family History  Problem Relation Age of Onset  . Cancer Mother   . Coronary artery disease Father   . Cerebrovascular Accident Father    Social History  Substance Use  Topics  . Smoking status: Former Smoker    Packs/day: 0.20    Types: Cigarettes  . Smokeless tobacco: Never Used     Comment: Haven't smoke since she went to the [place]  . Alcohol use No   Interim medical history since last visit reviewed. Allergies and medications reviewed  Review of Systems Per HPI unless specifically indicated above     Objective:    BP 110/60 (BP Location: Left Arm, Patient Position: Sitting, Cuff Size: Normal)   Pulse 76   Temp 98.3 F (36.8 C) (Oral)   Resp 16   Ht 5\' 3"  (1.6 m)   Wt 112 lb (50.8 kg)   SpO2 92%   BMI 19.84 kg/m   Wt Readings from Last 3 Encounters:  10/09/16 112 lb (50.8 kg)  09/08/16 110 lb 3 oz (50 kg)  09/05/16 110 lb (49.9 kg)    Physical Exam  Constitutional: She appears well-developed and well-nourished. No distress.  Weight gain just under 2 pounds since last visit; appears comfortable, NAD  Eyes: EOM are normal. No scleral icterus.  Neck: No thyromegaly present.  Cardiovascular: Normal rate and regular rhythm.   Pulmonary/Chest: Effort normal and breath sounds normal. No respiratory distress. She has no wheezes. She  has no rales.  Abdominal: She exhibits no distension.  Musculoskeletal: She exhibits no edema.  Lymphadenopathy:    She has no cervical adenopathy.  Skin: Skin is warm and dry. No erythema. No pallor.  Nailbeds are pale relative to examiner  Psychiatric: She has a normal mood and affect. Her behavior is normal.      Assessment & Plan:   Problem List Items Addressed This Visit      Respiratory   Panlobular emphysema ([place])    Quit smoking day before going to the hospital; refer to pulmonologist; patient asked for January      Relevant Medications   SPIRIVA HANDIHALER 18 MCG inhalation capsule   Other Relevant Orders   Ambulatory referral to Pulmonology   CAP (community acquired pneumonia) - Primary    Order f/u CXR      Relevant Medications   SPIRIVA HANDIHALER 18 MCG inhalation capsule   Other  Relevant Orders   DG Chest 2 View (Completed)   Ambulatory referral to Pulmonology     Digestive   Acid reflux    Since she is on plavix, will opt for short-term use of pantoprazole; cautions given, avoid triggers, etc      Relevant Medications   pantoprazole (PROTONIX) 40 MG tablet     Other   Hypoxemia    Check CBC today to ensure that anemia isn't compounding this problem; check f/u CXR and refer to pulmonologist; so glad she has quit smoking      Relevant Orders   Ambulatory referral to Pulmonology   CBC with Differential/Platelet (Completed)   Hx of smoking    So glad that patient has quit smoking; encouragement given to not start back up, especially on oxygen now         Follow up plan: No Follow-up on file.  An after-visit summary was printed and given to the patient at [place].  Please see the patient instructions which may contain other information and recommendations beyond what is mentioned above in the assessment and plan.  Meds ordered this encounter  Medications  . HYDROcodone-acetaminophen (NORCO) 10-325 MG tablet  . SPIRIVA HANDIHALER 18 MCG inhalation capsule  . pantoprazole (PROTONIX) 40 MG tablet    Sig: Take 1 tablet (40 mg total) by mouth at bedtime.    Dispense:  90 tablet    Refill:  0    PT NEEDS REFILL   Orders Placed This Encounter  Procedures  . DG Chest 2 View  . CBC with Differential/Platelet  . Ambulatory referral to Pulmonology

## 2016-10-09 NOTE — Assessment & Plan Note (Signed)
Quit smoking day before going to the hospital; refer to pulmonologist; patient asked for January

## 2016-10-09 NOTE — Assessment & Plan Note (Addendum)
Check CBC today to ensure that anemia isn't compounding this problem; check f/u CXR and refer to pulmonologist; so glad she has quit smoking

## 2016-10-13 ENCOUNTER — Ambulatory Visit
Admission: RE | Admit: 2016-10-13 | Discharge: 2016-10-13 | Disposition: A | Discharge status: Home / Self Care | Payer: Medicare Other | Source: Ambulatory Visit | Attending: Family Medicine | Admitting: Family Medicine

## 2016-10-13 DIAGNOSIS — J181 Lobar pneumonia, unspecified organism: Secondary | ICD-10-CM | POA: Diagnosis not present

## 2016-10-13 DIAGNOSIS — J440 Chronic obstructive pulmonary disease with acute lower respiratory infection: Secondary | ICD-10-CM | POA: Insufficient documentation

## 2016-10-13 DIAGNOSIS — J189 Pneumonia, unspecified organism: Secondary | ICD-10-CM | POA: Diagnosis not present

## 2016-10-15 ENCOUNTER — Encounter: Payer: Self-pay | Admitting: Family Medicine

## 2016-10-15 DIAGNOSIS — K219 Gastro-esophageal reflux disease without esophagitis: Secondary | ICD-10-CM | POA: Insufficient documentation

## 2016-10-15 HISTORY — DX: Gastro-esophageal reflux disease without esophagitis: K21.9

## 2016-10-15 NOTE — Assessment & Plan Note (Signed)
Since she is on plavix, will opt for short-term use of pantoprazole; cautions given, avoid triggers, etc

## 2016-10-15 NOTE — Assessment & Plan Note (Signed)
So glad that patient has quit smoking; encouragement given to not start back up, especially on oxygen now

## 2016-11-01 ENCOUNTER — Other Ambulatory Visit: Payer: Self-pay | Admitting: Family Medicine

## 2016-11-05 DIAGNOSIS — J449 Chronic obstructive pulmonary disease, unspecified: Secondary | ICD-10-CM | POA: Diagnosis not present

## 2016-11-08 NOTE — Telephone Encounter (Signed)
Glitch in our system; I was not receiving electronic refill requests from Dec 13 until yesterday; addressed with IT; addressing now ------------------------------------ rx approved

## 2016-11-29 ENCOUNTER — Ambulatory Visit: Payer: Medicare Other | Admitting: Family Medicine

## 2016-11-29 ENCOUNTER — Encounter: Payer: Self-pay | Admitting: Family Medicine

## 2016-11-29 ENCOUNTER — Ambulatory Visit
Admission: RE | Admit: 2016-11-29 | Discharge: 2016-11-29 | Disposition: A | Discharge status: Home / Self Care | Payer: Medicare Other | Source: Ambulatory Visit | Attending: Family Medicine | Admitting: Family Medicine

## 2016-11-29 ENCOUNTER — Ambulatory Visit (INDEPENDENT_AMBULATORY_CARE_PROVIDER_SITE_OTHER): Payer: Medicare Other | Admitting: Family Medicine

## 2016-11-29 ENCOUNTER — Other Ambulatory Visit: Payer: Self-pay | Admitting: Family Medicine

## 2016-11-29 DIAGNOSIS — M5134 Other intervertebral disc degeneration, thoracic region: Secondary | ICD-10-CM | POA: Diagnosis not present

## 2016-11-29 DIAGNOSIS — M79652 Pain in left thigh: Secondary | ICD-10-CM | POA: Insufficient documentation

## 2016-11-29 DIAGNOSIS — Z5181 Encounter for therapeutic drug level monitoring: Secondary | ICD-10-CM | POA: Diagnosis not present

## 2016-11-29 DIAGNOSIS — G894 Chronic pain syndrome: Secondary | ICD-10-CM

## 2016-11-29 DIAGNOSIS — Z79899 Other long term (current) drug therapy: Secondary | ICD-10-CM | POA: Diagnosis not present

## 2016-11-29 DIAGNOSIS — E041 Nontoxic single thyroid nodule: Secondary | ICD-10-CM

## 2016-11-29 DIAGNOSIS — E441 Mild protein-calorie malnutrition: Secondary | ICD-10-CM | POA: Diagnosis not present

## 2016-11-29 DIAGNOSIS — E782 Mixed hyperlipidemia: Secondary | ICD-10-CM | POA: Diagnosis not present

## 2016-11-29 LAB — CBC WITH DIFFERENTIAL/PLATELET
BASOS PCT: 1 %
Basophils Absolute: 67 cells/uL (ref 0–200)
EOS PCT: 2 %
Eosinophils Absolute: 134 cells/uL (ref 15–500)
HCT: 39.5 % (ref 35.0–45.0)
HEMOGLOBIN: 12.8 g/dL (ref 11.7–15.5)
LYMPHS ABS: 2613 {cells}/uL (ref 850–3900)
Lymphocytes Relative: 39 %
MCH: 32.5 pg (ref 27.0–33.0)
MCHC: 32.4 g/dL (ref 32.0–36.0)
MCV: 100.3 fL — ABNORMAL HIGH (ref 80.0–100.0)
MONOS PCT: 8 %
MPV: 10 fL (ref 7.5–12.5)
Monocytes Absolute: 536 cells/uL (ref 200–950)
NEUTROS ABS: 3350 {cells}/uL (ref 1500–7800)
Neutrophils Relative %: 50 %
PLATELETS: 188 10*3/uL (ref 140–400)
RBC: 3.94 MIL/uL (ref 3.80–5.10)
RDW: 13.4 % (ref 11.0–15.0)
WBC: 6.7 10*3/uL (ref 3.8–10.8)

## 2016-11-29 LAB — LIPID PANEL
Cholesterol: 132 mg/dL (ref <200)
HDL: 57 mg/dL (ref >50)
LDL CALC: 15 mg/dL (ref <100)
TRIGLYCERIDES: 302 mg/dL — AB (ref <150)
Total CHOL/HDL Ratio: 2.3 Ratio (ref <5.0)
VLDL: 60 mg/dL — ABNORMAL HIGH (ref <30)

## 2016-11-29 LAB — COMPLETE METABOLIC PANEL WITH GFR
ALT: 9 U/L (ref 6–29)
AST: 20 U/L (ref 10–35)
Albumin: 4.1 g/dL (ref 3.6–5.1)
Alkaline Phosphatase: 99 U/L (ref 33–130)
BILIRUBIN TOTAL: 1 mg/dL (ref 0.2–1.2)
BUN: 18 mg/dL (ref 7–25)
CO2: 27 mmol/L (ref 20–31)
CREATININE: 1.01 mg/dL — AB (ref 0.60–0.88)
Calcium: 9.9 mg/dL (ref 8.6–10.4)
Chloride: 109 mmol/L (ref 98–110)
GFR, EST AFRICAN AMERICAN: 59 mL/min — AB (ref >=60)
GFR, Est Non African American: 51 mL/min — ABNORMAL LOW (ref >=60)
Glucose, Bld: 101 mg/dL — ABNORMAL HIGH (ref 65–99)
Potassium: 5.3 mmol/L (ref 3.5–5.3)
Sodium: 144 mmol/L (ref 135–146)
TOTAL PROTEIN: 6.9 g/dL (ref 6.1–8.1)

## 2016-11-29 MED ORDER — ROSUVASTATIN CALCIUM 5 MG PO TABS: ORAL_TABLET | ORAL | 5 refills | Status: DC

## 2016-11-29 MED ORDER — HYDROCODONE-ACETAMINOPHEN 10-325 MG PO TABS: 1.0000 | ORAL_TABLET | Freq: Four times a day (QID) | ORAL | 0 refills | Status: DC | PRN

## 2016-11-29 MED ORDER — VALSARTAN 80 MG PO TABS: 40.0000 mg | ORAL_TABLET | Freq: Every day | ORAL | 5 refills | Status: DC

## 2016-11-29 NOTE — Patient Instructions (Signed)
Decrease valsartan to just half of a pill daily for blood pressure Continue your other medicines Have the xray done across the street If leg still bothering you in 1-2 weeks, or if you feel a mass, then let me know and we'll get an MRI

## 2016-11-29 NOTE — Assessment & Plan Note (Signed)
Check lipids; avoid saturated fats 

## 2016-11-29 NOTE — Progress Notes (Signed)
Changing statin instructions; new Rx sent

## 2016-11-29 NOTE — Progress Notes (Signed)
BP 108/60   Pulse 88   Temp 98.2 F (36.8 C)   Resp 14   Wt 110 lb 3 oz (50 kg)   SpO2 94%   BMI 19.52 kg/m    Subjective:    Patient ID: Samantha Dawson, female    DOB: [birth year], 83 y.o.   MRN: [id number]  HPI: Samantha Dawson is a 83 y.o. female  Chief Complaint  Patient presents with  . Follow-up    Sore right above knee; medication management     She has been having an issue with a sore place above her left knee; distal thigh; now spread up into the thigh; no rash, no redness; no shingles; getting worse; has not felt any mass; few months duration; no pain from back; no pain from knee  Renew pain medicine today; current pain medicine is handling her pain pretty well; helps with the thigh pain;  Thoracic DDD, arthritis  High blood pressure; controlled today  High cholesterol; on statin; no problems; tries to avoid fatty meats; not much cheese, just once in a while  Recent pneumonia; has been on oxygen; not using it really; feels good; breathing okay; would love to get rid of the oxygen  Appetite is good; weight overall stable, not easy to gain weight  No bleeding from the Plavix  Walk around the office, pulse ox dropped to 90%, and feeling okay; patient really wishes to get rid of the supplemental oxygen  Depression screen [facility] 2/9 11/29/2016 10/09/2016 09/08/2016 08/29/2016 05/03/2016  Decreased Interest 0 0 0 0 0  Down, Depressed, Hopeless 0 0 0 0 0  PHQ - 2 Score 0 0 0 0 0   Relevant past medical, surgical, family and social history reviewed Past Medical History:  Diagnosis Date  . Acid reflux 10/15/2016  . Anxiety   . Controlled substance agreement signed 03/22/2016  . 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 Encounter for chronic pain management   . Hx of smoking 04/17/2016  . Hyperlipidemia   . Hypertension   . Osteoarthritis   . Osteoporosis 03/22/2016  . Primary osteoarthritis of both hands 05/03/2016   Past Surgical History:  Procedure  Laterality Date  . ABDOMINAL HYSTERECTOMY    . CATARACT EXTRACTION    . endaryerectomy    . renal stenting     Family History  Problem Relation Age of Onset  . Cancer Mother   . Coronary artery disease Father   . Cerebrovascular Accident Father    Social History  Substance Use Topics  . Smoking status: Former Smoker    Packs/day: 0.20    Types: Cigarettes  . Smokeless tobacco: Never Used     Comment: Haven't smoke since she went to the [place]  . Alcohol use No   Interim medical history since last visit reviewed. Allergies and medications reviewed  Review of Systems Per HPI unless specifically indicated above     Objective:    BP 108/60   Pulse 88   Temp 98.2 F (36.8 C)   Resp 14   Wt 110 lb 3 oz (50 kg)   SpO2 94%   BMI 19.52 kg/m   Wt Readings from Last 3 Encounters:  11/29/16 110 lb 3 oz (50 kg)  10/09/16 112 lb (50.8 kg)  09/08/16 110 lb 3 oz (50 kg)    Physical Exam  Constitutional: She appears well-developed and well-nourished. No distress.  HENT:  Head: Normocephalic and atraumatic.  Eyes: EOM are normal. No scleral  icterus.  Neck: No thyromegaly present.  Cardiovascular: Normal rate, regular rhythm and normal heart sounds.   No murmur heard. Pulmonary/Chest: Effort normal and breath sounds normal. She has no wheezes.  Abdominal: Soft. She exhibits no distension.  Musculoskeletal: Normal range of motion. She exhibits no edema.       Left knee: She exhibits no swelling, no effusion, no deformity and no erythema.       Thoracic back: She exhibits deformity (kyphosis).       Right hand: She exhibits deformity (heberdens and bouchards nodes several fingers).       Left hand: She exhibits bony tenderness (tender over the 4th PIP LEFT hand) and deformity (heberdens and bouchards nodes several fingers).       Left upper leg: She exhibits no tenderness, no bony tenderness, no swelling, no edema and no deformity.  Neurological: She is alert. She exhibits normal  muscle tone. Coordination and gait normal.  Ambulatory without assistive device  Skin: Skin is warm and dry. She is not diaphoretic. No pallor.  Palmar erythema  Psychiatric: She has a normal mood and affect. Her behavior is normal. Judgment and thought content normal. Her mood appears not anxious. Cognition and memory are not impaired. She does not exhibit a depressed mood.      Assessment & Plan:   Problem List Items Addressed This Visit      Endocrine   Left thyroid nodule    Seeing Dr. Richardson Landry, ENT        Musculoskeletal and Integument   Degenerative disc disease, thoracic    Continue pain medicine      Relevant Medications   HYDROcodone-acetaminophen (NORCO) 10-325 MG tablet   HYDROcodone-acetaminophen (NORCO) 10-325 MG tablet   HYDROcodone-acetaminophen (NORCO) 10-325 MG tablet     Other   Thigh pain, musculoskeletal, left    Start with femur xray; try topical biofreeze; patient to watch for lumps, will get MRI if any mass or worsening pain      Relevant Orders   DG FEMUR MIN 2 VIEWS LEFT (Completed)   Protein calorie malnutrition (HCC)    Check protein, albumin      Relevant Orders   COMPLETE METABOLIC PANEL WITH GFR (Completed)   Medication monitoring encounter    Check labs      Relevant Orders   COMPLETE METABOLIC PANEL WITH GFR (Completed)   CBC with Differential/Platelet (Completed)   Hyperlipidemia    Check lipids; avoid saturated fats      Relevant Medications   valsartan (DIOVAN) 80 MG tablet   Other Relevant Orders   Lipid panel (Completed)   Controlled substance agreement signed    Reviewed [facility] web site over last 12 months; no red flags; 3 months of medicine provided today      Chronic pain    Continue current pain medicines; knows to not drink alcohol or take sleeping pills or anxiety pills with this medicine, reminded today      Relevant Medications   HYDROcodone-acetaminophen (NORCO) 10-325 MG tablet   HYDROcodone-acetaminophen  (NORCO) 10-325 MG tablet   HYDROcodone-acetaminophen (NORCO) 10-325 MG tablet      Follow up plan: Return in about 3 months (around 02/27/2017) for medication management.  An after-visit summary was printed and given to the patient at [place].  Please see the patient instructions which may contain other information and recommendations beyond what is mentioned above in the assessment and plan.  Meds ordered this encounter  Medications  . valsartan (DIOVAN) 80 MG  tablet    Sig: Take 0.5 tablets (40 mg total) by mouth daily.    Dispense:  15 tablet    Refill:  5  . HYDROcodone-acetaminophen (NORCO) 10-325 MG tablet    Sig: Take 1 tablet by mouth every 6 (six) hours as needed. #1 may fill now    Dispense:  105 tablet    Refill:  0  . HYDROcodone-acetaminophen (NORCO) 10-325 MG tablet    Sig: Take 1 tablet by mouth every 6 (six) hours as needed. #2 may fill no or after December 29, 2016    Dispense:  105 tablet    Refill:  0  . HYDROcodone-acetaminophen (NORCO) 10-325 MG tablet    Sig: Take 1 tablet by mouth every 6 (six) hours as needed. #3 may fill on or after January 28, 2017    Dispense:  105 tablet    Refill:  0    Orders Placed This Encounter  Procedures  . DG FEMUR MIN 2 VIEWS LEFT  . COMPLETE METABOLIC PANEL WITH GFR  . Lipid panel  . CBC with Differential/Platelet

## 2016-11-29 NOTE — Assessment & Plan Note (Signed)
Check labs 

## 2016-11-29 NOTE — Assessment & Plan Note (Signed)
Continue pain medicine °

## 2016-11-29 NOTE — Assessment & Plan Note (Signed)
Continue current pain medicines; knows to not drink alcohol or take sleeping pills or anxiety pills with this medicine, reminded today

## 2016-11-29 NOTE — Assessment & Plan Note (Signed)
Start with femur xray; try topical biofreeze; patient to watch for lumps, will get MRI if any mass or worsening pain

## 2016-11-29 NOTE — Assessment & Plan Note (Signed)
Check protein, albumin

## 2016-11-29 NOTE — Assessment & Plan Note (Addendum)
Reviewed [place] web site over last 12 months; no red flags; 3 months of medicine provided today

## 2016-11-29 NOTE — Assessment & Plan Note (Signed)
Seeing Dr. Richardson Landry, ENT

## 2016-12-27 ENCOUNTER — Institutional Professional Consult (permissible substitution): Payer: Medicare Other | Admitting: Internal Medicine

## 2016-12-28 ENCOUNTER — Other Ambulatory Visit: Payer: Self-pay

## 2016-12-28 MED ORDER — CLOPIDOGREL BISULFATE 75 MG PO TABS: 75.0000 mg | ORAL_TABLET | Freq: Every day | ORAL | 3 refills | Status: DC

## 2017-01-01 ENCOUNTER — Encounter (INDEPENDENT_AMBULATORY_CARE_PROVIDER_SITE_OTHER): Payer: Self-pay

## 2017-01-01 ENCOUNTER — Encounter: Payer: Self-pay | Admitting: Pulmonary Disease

## 2017-01-01 ENCOUNTER — Ambulatory Visit (INDEPENDENT_AMBULATORY_CARE_PROVIDER_SITE_OTHER): Payer: Medicare Other | Admitting: Pulmonary Disease

## 2017-01-01 VITALS — BP 120/62 | HR 99 | Ht 64.0 in | Wt 117.8 lb

## 2017-01-01 DIAGNOSIS — R0609 Other forms of dyspnea: Secondary | ICD-10-CM | POA: Diagnosis not present

## 2017-01-01 DIAGNOSIS — Z87891 Personal history of nicotine dependence: Secondary | ICD-10-CM

## 2017-01-01 DIAGNOSIS — J449 Chronic obstructive pulmonary disease, unspecified: Secondary | ICD-10-CM | POA: Diagnosis not present

## 2017-01-01 DIAGNOSIS — H353131 Nonexudative age-related macular degeneration, bilateral, early dry stage: Secondary | ICD-10-CM | POA: Diagnosis not present

## 2017-01-01 NOTE — Progress Notes (Signed)
PULMONARY CONSULT NOTE  Requesting MD/Service: Enid Derry, MD Date of initial consultation: 01/01/2017 Reason for consultation: COPD and shortness of breath  PT PROFILE: 83 y.o. F former smoker with recent diagnosis of COPD  HPI:  Samantha Dawson is an 83 year old woman who has smoked all of her adult life until October 2017. At that time she developed "pneumonia" which was treated in the hospital with antibiotics. She underwent a chest x-ray at that time which revealed "COPD". Since that time she has gradually returned back to her baseline. At her baseline she has mild to moderate exertional dyspnea. She denies chest pain cough paroxysmal nocturnal dyspnea and orthopnea. She has never had hemoptysis. She does have intermittent lower extremity edema which she describes as very mild. Somewhere in the last couple months she was started on the vas Bevespi and believes that this has helped her shortness of breath. She is also on Spiriva which she was taking prior to the onset of her pneumonia and October. She has an albuterol rescue inhaler which she says that she uses 4-5 times per day. When she uses this it does give her some mild relief of exertional dyspnea. She believes that her use of albuterol has decreased since starting the Bevespi..   Past Medical History:  Diagnosis Date  . Acid reflux 10/15/2016  . Anxiety   . Controlled substance agreement signed 03/22/2016  . 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 Encounter for chronic pain management   . Hx of smoking 04/17/2016  . Hyperlipidemia   . Hypertension   . Osteoarthritis   . Osteoporosis 03/22/2016  . Primary osteoarthritis of both hands 05/03/2016    Past Surgical History:  Procedure Laterality Date  . ABDOMINAL HYSTERECTOMY    . CATARACT EXTRACTION    . endaryerectomy    . renal stenting      MEDICATIONS: I have reviewed all medications and confirmed regimen as documented  Social History   Social History  .  Marital status: Widowed    Spouse name: N/A  . Number of children: N/A  . Years of education: N/A   Occupational History  . Not on file.   Social History Main Topics  . Smoking status: Former Smoker    Packs/day: 1 ppd X > 50 yrs    Types: Cigarettes  . Smokeless tobacco: Never Used     Comment: Haven't smoke since she went to the [facility] 09-02-16  . Alcohol use No  . Drug use: No  . Sexual activity: No   Other Topics Concern  . Not on file   Social History Narrative  . No narrative on file    Family History  Problem Relation Age of Onset  . Cancer Mother   . Coronary artery disease Father   . Cerebrovascular Accident Father     ROS: No fever, myalgias/arthralgias, unexplained weight loss or weight gain No new focal weakness or sensory deficits No otalgia, hearing loss, visual changes, nasal and sinus symptoms, mouth and throat problems No neck pain or adenopathy No abdominal pain, N/V/D, diarrhea, change in bowel pattern No dysuria, change in urinary pattern   Vitals:   01/01/17 1334  BP: 120/62  Pulse: 99  SpO2: 93%  Weight: 117 lb 12.8 oz (53.4 kg)  Height: 5\' 4"  (1.626 m)     EXAM:  Gen: WDWN, No overt respiratory distress HEENT: NCAT, sclera white, oropharynx normal Neck: Supple without LAN, thyromegaly, JVD Lungs: breath sounds: Moderately diminished, percussion: Normal, No wheezes or other  adventitious sounds Cardiovascular: RRR, no murmurs noted Abdomen: Soft, nontender, normal BS Ext: without clubbing, cyanosis, edema Neuro: CNs grossly intact, motor and sensory intact Skin: Limited exam, no lesions noted  DATA:   BMP Latest Ref Rng & Units 11/29/2016 09/03/2016 09/02/2016  Glucose 65 - 99 mg/dL 101(H) 155(H) 161(H)  BUN 7 - 25 mg/dL 18 17 25(H)  Creatinine 0.60 - 0.88 mg/dL 1.01(H) 0.66 0.91  BUN/Creat Ratio 11 - 26 - - -  Sodium 135 - 146 mmol/L 144 141 135  Potassium 3.5 - 5.3 mmol/L 5.3 4.2 4.2  Chloride 98 - 110 mmol/L 109 109 101  CO2  20 - 31 mmol/L 27 25 24   Calcium 8.6 - 10.4 mg/dL 9.9 8.6(L) 9.0    CBC Latest Ref Rng & Units 11/29/2016 10/09/2016 09/03/2016  WBC 3.8 - 10.8 K/uL 6.7 7.9 7.6  Hemoglobin 11.7 - 15.5 g/dL 12.8 13.3 12.7  Hematocrit 35.0 - 45.0 % 39.5 40.7 37.9  Platelets 140 - 400 K/uL 188 179 156    CXR (10/13/17):  Moderate kyphosis, probable mild to moderate hyperinflation, no acute cardiac or pulmonary disease  IMPRESSION:   1) Former smoker of > 50 PY 2) Moderate DOE 3) Likely COPD with favorable response to LABA/LAMA  She is doubled up on the LAMA taking both Spiriva and Glycopyrrolate in the [employer]   PLAN:  Stop Spiriva Continue Bevespi inhaler  - 2 sprays twice a day Continue albuterol inhaler as needed She needs a flu shot every year Congratulated her on her success on quitting smoking  She wishes to follow up on an as needed basis only for any breathing or lung problems   Merton Border, MD PCCM service Mobile [phone] Pager [phone] 01/01/2017

## 2017-01-01 NOTE — Patient Instructions (Signed)
Stop Spiriva Continue Bevespi inhaler  - 2 sprays twice a day Continue albuterol inhaler as needed You need a flu shot every year Congratulations on quitting smoking  Follow up as needed for any breathing or lung problems

## 2017-01-29 ENCOUNTER — Institutional Professional Consult (permissible substitution): Payer: Medicare Other | Admitting: Internal Medicine

## 2017-02-12 IMAGING — CR DG CHEST 2V
1 series · 2 of 2 positions shown · non-contrast
Comparison: 09/03/2016

CLINICAL DATA: Right lower lobe pneumonia, follow-up

EXAM:
CHEST  2 VIEW

[Series 1: dg chest 2 view · 0.14mm/px · 2 of 2 slices shown]
[im 1/2]
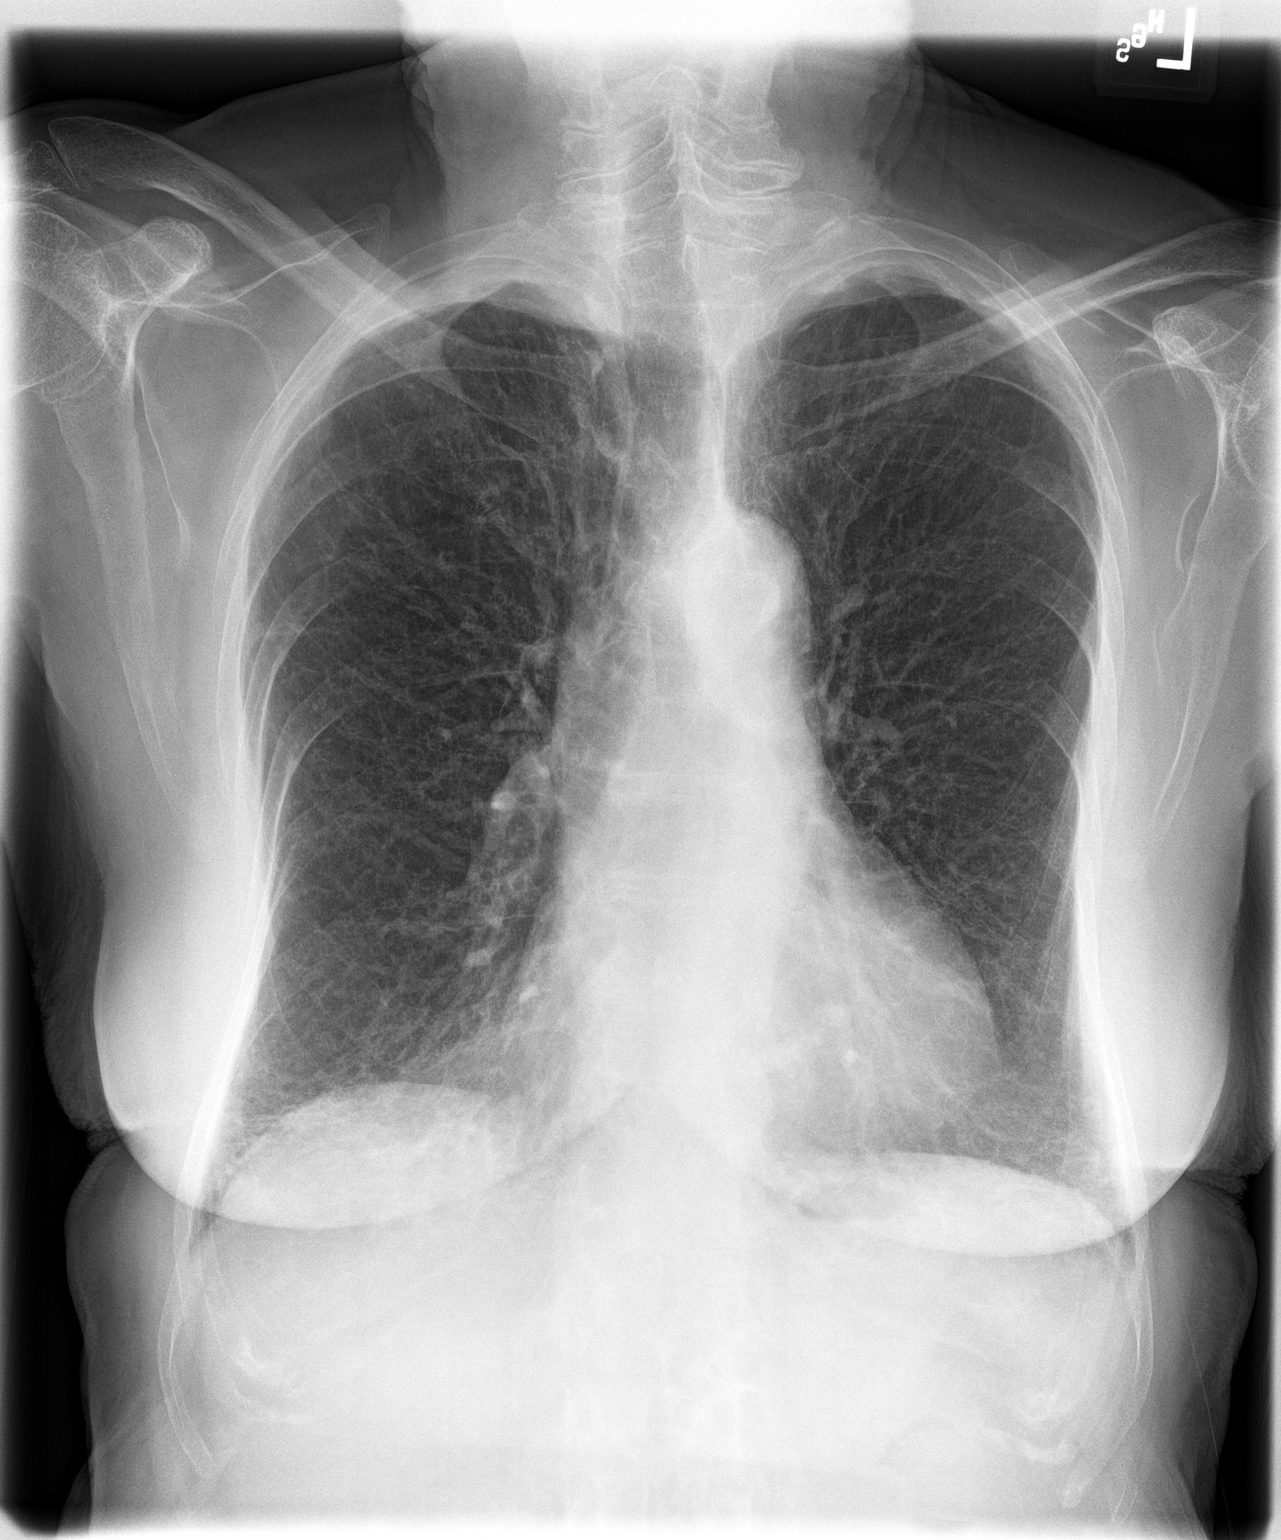
[im 2/2]
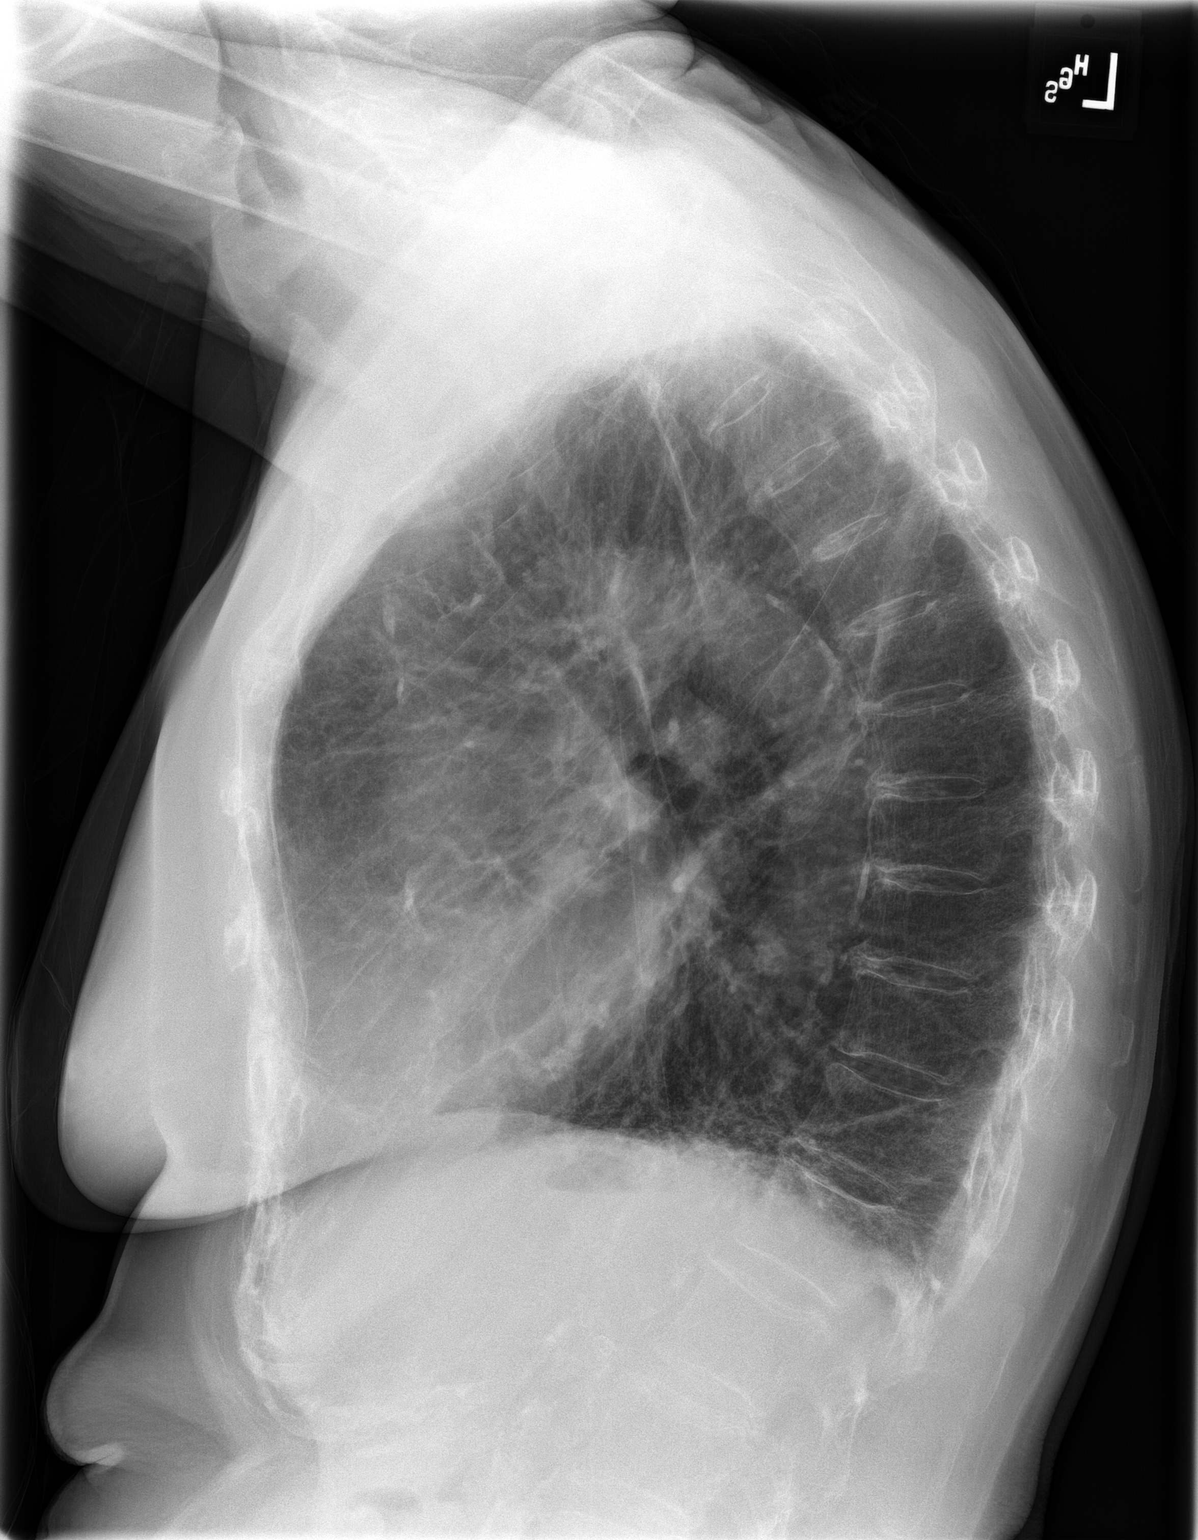

[2 of 2 positions shown; findings below may reference images not displayed]

FINDINGS: Airspace opacities have improved since prior study. Mild residual
patchy bibasilar opacities likely reflects atelectasis or scarring.
Biapical scarring. There is hyperinflation of the lungs compatible
with COPD. Heart is normal size. No effusions or acute bony
abnormality.
IMPRESSION: Improving bilateral patchy airspace opacities with residual
bibasilar atelectasis or scarring.

COPD/ chronic changes.

## 2017-02-27 ENCOUNTER — Encounter: Payer: Self-pay | Admitting: Family Medicine

## 2017-02-27 ENCOUNTER — Telehealth: Payer: Self-pay | Admitting: Family Medicine

## 2017-02-27 ENCOUNTER — Ambulatory Visit (INDEPENDENT_AMBULATORY_CARE_PROVIDER_SITE_OTHER): Payer: Medicare Other | Admitting: Family Medicine

## 2017-02-27 DIAGNOSIS — M19041 Primary osteoarthritis, right hand: Secondary | ICD-10-CM | POA: Diagnosis not present

## 2017-02-27 DIAGNOSIS — G894 Chronic pain syndrome: Secondary | ICD-10-CM | POA: Diagnosis not present

## 2017-02-27 DIAGNOSIS — J431 Panlobular emphysema: Secondary | ICD-10-CM

## 2017-02-27 DIAGNOSIS — M5134 Other intervertebral disc degeneration, thoracic region: Secondary | ICD-10-CM | POA: Diagnosis not present

## 2017-02-27 DIAGNOSIS — I10 Essential (primary) hypertension: Secondary | ICD-10-CM

## 2017-02-27 DIAGNOSIS — G8929 Other chronic pain: Secondary | ICD-10-CM | POA: Diagnosis not present

## 2017-02-27 DIAGNOSIS — Z79899 Other long term (current) drug therapy: Secondary | ICD-10-CM

## 2017-02-27 DIAGNOSIS — M19042 Primary osteoarthritis, left hand: Secondary | ICD-10-CM | POA: Diagnosis not present

## 2017-02-27 DIAGNOSIS — K219 Gastro-esophageal reflux disease without esophagitis: Secondary | ICD-10-CM

## 2017-02-27 MED ORDER — HYDROCODONE-ACETAMINOPHEN 10-325 MG PO TABS: 1.0000 | ORAL_TABLET | Freq: Four times a day (QID) | ORAL | 0 refills | Status: DC | PRN

## 2017-02-27 MED ORDER — RANITIDINE HCL 150 MG PO TABS: 150.0000 mg | ORAL_TABLET | Freq: Every day | ORAL | 3 refills | Status: DC

## 2017-02-27 MED ORDER — PANTOPRAZOLE SODIUM 40 MG PO TBEC: 40.0000 mg | DELAYED_RELEASE_TABLET | Freq: Every day | ORAL | 3 refills | Status: DC

## 2017-02-27 NOTE — Assessment & Plan Note (Signed)
Excellent control.   

## 2017-02-27 NOTE — Assessment & Plan Note (Signed)
Managed here at [facility]; no red flags in regards to use, requesting early refills, etc.; on controlled substance contract; will see her every 3 months for routine f/u; pain medicine improves quality of life without causing untoward side effects

## 2017-02-27 NOTE — Assessment & Plan Note (Signed)
Chronic pain management; no red flags; refills x 3 months provided

## 2017-02-27 NOTE — Assessment & Plan Note (Signed)
New controlled substance contract signed today; UDS collected per protocol; [place] web site reviewed over the last year; no red flags; no early refills, 3 months of prescriptions given today with appropriate fill on or after dates

## 2017-02-27 NOTE — Assessment & Plan Note (Signed)
Refill the pantoprazole; using this PPI because she is on plavix; will have her avoid NSAIDs and triggers food/drink; add H2 blocker to morning, continue PPI at night; she denies red flags (blood in stool, abd pain); keep me posted

## 2017-02-27 NOTE — Assessment & Plan Note (Signed)
Managed with hydrocodone (hands, back pain); continue same regimen

## 2017-02-27 NOTE — Progress Notes (Signed)
BP 118/64 (BP Location: Left Arm, Patient Position: Sitting, Cuff Size: Normal)   Pulse 83   Temp 97.6 F (36.4 C) (Oral)   Resp 16   Wt 119 lb 4.8 oz (54.1 kg)   SpO2 91%   BMI 20.48 kg/m    Subjective:    Patient ID: Samantha Dawson, female    DOB: 06/14/1929, 83 y.o.   MRN: [id number]  HPI: Samantha Dawson is a 83 y.o. female  Chief Complaint  Patient presents with  . Follow-up    Medication management     HPI Chronic pain management; here for routine f/u today Has been in chronic pain for years from osteoarthritis, DDD (thoracic spine) Has been on hydrocodone since seeing Dr. Rutherford Nail She tried so many other medicines, nothing else helped this did No issues with constipation Medicine does not make her goofy or loopy Able to do household chores and function better; medicine improves her quality of life She does not want to taper down at this time, feels like she needs it at this time  She has acid relfux; using the pantoprazole; on plavix; she took the last one last week; needs refills Certain triggers include anything acidic; likes green peppers and those don't bother her; she avoids orange juice, can eat an orange; no blood in stool, no abdominal pain, no hx of ulcer; no advil or motrin   COPD; quit smoking in October; inhalers are helping  Depression screen [facility] 2/9 02/27/2017 11/29/2016 10/09/2016 09/08/2016 08/29/2016  Decreased Interest 0 0 0 0 0  Down, Depressed, Hopeless 0 0 0 0 0  PHQ - 2 Score 0 0 0 0 0    Relevant past medical, surgical, family and social history reviewed Past Medical History:  Diagnosis Date  . Acid reflux 10/15/2016  . Anxiety   . Controlled substance agreement signed 03/22/2016  . 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 Encounter for chronic pain management   . Hx of smoking 04/17/2016  . Hyperlipidemia   . Hypertension   . Osteoarthritis   . Osteoporosis 03/22/2016  . Primary osteoarthritis of both hands 05/03/2016   Past  Surgical History:  Procedure Laterality Date  . ABDOMINAL HYSTERECTOMY    . CATARACT EXTRACTION    . endaryerectomy    . renal stenting     Family History  Problem Relation Age of Onset  . Cancer Mother   . Coronary artery disease Father   . Cerebrovascular Accident Father    Social History  Substance Use Topics  . Smoking status: Former Smoker    Packs/day: 0.20    Types: Cigarettes  . Smokeless tobacco: Never Used     Comment: Haven't smoke since she went to the [facility] 09-02-16  . Alcohol use No    Interim medical history since last visit reviewed. Allergies and medications reviewed  Review of Systems Per HPI unless specifically indicated above     Objective:    BP 118/64 (BP Location: Left Arm, Patient Position: Sitting, Cuff Size: Normal)   Pulse 83   Temp 97.6 F (36.4 C) (Oral)   Resp 16   Wt 119 lb 4.8 oz (54.1 kg)   SpO2 91%   BMI 20.48 kg/m   Wt Readings from Last 3 Encounters:  02/27/17 119 lb 4.8 oz (54.1 kg)  01/01/17 117 lb 12.8 oz (53.4 kg)  11/29/16 110 lb 3 oz (50 kg)    Physical Exam  Constitutional: She appears well-developed and well-nourished. No distress.  Eyes: EOM are normal. No scleral icterus.  Neck: No thyromegaly present.  Cardiovascular: Normal rate, regular rhythm and normal heart sounds.   No murmur heard. Pulmonary/Chest: Effort normal and breath sounds normal. She has no wheezes.  Abdominal: Soft. She exhibits no distension.  Musculoskeletal: Normal range of motion. She exhibits no edema.       Thoracic back: She exhibits deformity (kyphosis). She exhibits no bony tenderness.       Right hand: She exhibits deformity (heberdens and bouchards nodes several fingers).       Left hand: She exhibits deformity (heberdens and bouchards nodes several fingers).  Neurological: She is alert. She exhibits normal muscle tone. Coordination and gait normal.  Ambulatory without assistive device  Skin: Skin is warm and dry. She is not  diaphoretic. No pallor.  Palmar erythema  Psychiatric: She has a normal mood and affect. Her behavior is normal. Judgment and thought content normal. Her mood appears not anxious. Cognition and memory are not impaired. She does not exhibit a depressed mood.   Results for orders placed or performed in visit on 11/29/16  COMPLETE METABOLIC PANEL WITH GFR  Result Value Ref Range   Sodium 144 135 - 146 mmol/L   Potassium 5.3 3.5 - 5.3 mmol/L   Chloride 109 98 - 110 mmol/L   CO2 27 20 - 31 mmol/L   Glucose, Bld 101 (H) 65 - 99 mg/dL   BUN 18 7 - 25 mg/dL   Creat 1.01 (H) 0.60 - 0.88 mg/dL   Total Bilirubin 1.0 0.2 - 1.2 mg/dL   Alkaline Phosphatase 99 33 - 130 U/L   AST 20 10 - 35 U/L   ALT 9 6 - 29 U/L   Total Protein 6.9 6.1 - 8.1 g/dL   Albumin 4.1 3.6 - 5.1 g/dL   Calcium 9.9 8.6 - 10.4 mg/dL   GFR, Est African American 59 (L) >=60 mL/min   GFR, Est Non African American 51 (L) >=60 mL/min  Lipid panel  Result Value Ref Range   Cholesterol 132 <200 mg/dL   Triglycerides 302 (H) <150 mg/dL   HDL 57 >50 mg/dL   Total CHOL/HDL Ratio 2.3 <5.0 Ratio   VLDL 60 (H) <30 mg/dL   LDL Cholesterol 15 <100 mg/dL  CBC with Differential/Platelet  Result Value Ref Range   WBC 6.7 3.8 - 10.8 K/uL   RBC 3.94 3.80 - 5.10 MIL/uL   Hemoglobin 12.8 11.7 - 15.5 g/dL   HCT 39.5 35.0 - 45.0 %   MCV 100.3 (H) 80.0 - 100.0 fL   MCH 32.5 27.0 - 33.0 pg   MCHC 32.4 32.0 - 36.0 g/dL   RDW 13.4 11.0 - 15.0 %   Platelets 188 140 - 400 K/uL   MPV 10.0 7.5 - 12.5 fL   Neutro Abs 3,350 1,500 - 7,800 cells/uL   Lymphs Abs 2,613 850 - 3,900 cells/uL   Monocytes Absolute 536 200 - 950 cells/uL   Eosinophils Absolute 134 15 - 500 cells/uL   Basophils Absolute 67 0 - 200 cells/uL   Neutrophils Relative % 50 %   Lymphocytes Relative 39 %   Monocytes Relative 8 %   Eosinophils Relative 2 %   Basophils Relative 1 %   Smear Review Criteria for review not met       Assessment & Plan:   Problem List Items  Addressed This Visit      Cardiovascular and Mediastinum   Essential hypertension    Excellent control  Respiratory   Panlobular emphysema (HCC)    Oxygen sats adequate; continue inhalers        Digestive   Acid reflux    Refill the pantoprazole; using this PPI because she is on plavix; will have her avoid NSAIDs and triggers food/drink; add H2 blocker to morning, continue PPI at night; she denies red flags (blood in stool, abd pain); keep me posted      Relevant Medications   pantoprazole (PROTONIX) 40 MG tablet   ranitidine (ZANTAC) 150 MG tablet     Musculoskeletal and Integument   Primary osteoarthritis of both hands    Managed with hydrocodone (hands, back pain); continue same regimen      Relevant Medications   HYDROcodone-acetaminophen (NORCO) 10-325 MG tablet   HYDROcodone-acetaminophen (NORCO) 10-325 MG tablet   HYDROcodone-acetaminophen (NORCO) 10-325 MG tablet   Degenerative disc disease, thoracic    Chronic pain management; no red flags; refills x 3 months provided      Relevant Medications   HYDROcodone-acetaminophen (NORCO) 10-325 MG tablet   HYDROcodone-acetaminophen (NORCO) 10-325 MG tablet   HYDROcodone-acetaminophen (NORCO) 10-325 MG tablet     Other   Controlled substance agreement signed    New controlled substance contract signed today; UDS collected per protocol; [place] web site reviewed over the last year; no red flags; no early refills, 3 months of prescriptions given today with appropriate fill on or after dates      Chronic pain    Managed here at [facility]; no red flags in regards to use, requesting early refills, etc.; on controlled substance contract; will see her every 3 months for routine f/u; pain medicine improves quality of life without causing untoward side effects      Relevant Medications   HYDROcodone-acetaminophen (NORCO) 10-325 MG tablet   HYDROcodone-acetaminophen (NORCO) 10-325 MG tablet   HYDROcodone-acetaminophen  (NORCO) 10-325 MG tablet       Follow up plan: Return in about 3 months (around 05/29/2017) for visit with DR. Lada.  An after-visit summary was printed and given to the patient at check-out.  Please see the patient instructions which may contain other information and recommendations beyond what is mentioned above in the assessment and plan.  Meds ordered this encounter  Medications  . pantoprazole (PROTONIX) 40 MG tablet    Sig: Take 1 tablet (40 mg total) by mouth at bedtime.    Dispense:  90 tablet    Refill:  3    PT NEEDS REFILL  . ranitidine (ZANTAC) 150 MG tablet    Sig: Take 1 tablet (150 mg total) by mouth daily.    Dispense:  90 tablet    Refill:  3  . HYDROcodone-acetaminophen (NORCO) 10-325 MG tablet    Sig: Take 1 tablet by mouth every 6 (six) hours as needed. #1 may fill now    Dispense:  105 tablet    Refill:  0  . HYDROcodone-acetaminophen (NORCO) 10-325 MG tablet    Sig: Take 1 tablet by mouth every 6 (six) hours as needed.    Dispense:  105 tablet    Refill:  0    May fill on or after Mar 29, 2017  . HYDROcodone-acetaminophen (NORCO) 10-325 MG tablet    Sig: Take 1 tablet by mouth every 6 (six) hours as needed.    Dispense:  105 tablet    Refill:  0    May fill on or after April 28, 2017    No orders of the defined types were placed  in this encounter.

## 2017-02-27 NOTE — Telephone Encounter (Signed)
Please remind patient to have her bone density test done soon Order is from May 2017 Order new if expired Thank you

## 2017-02-27 NOTE — Assessment & Plan Note (Signed)
Oxygen sats adequate; continue inhalers

## 2017-02-28 NOTE — Telephone Encounter (Signed)
Left detailed voicemail

## 2017-03-06 DIAGNOSIS — Z85828 Personal history of other malignant neoplasm of skin: Secondary | ICD-10-CM | POA: Diagnosis not present

## 2017-03-06 DIAGNOSIS — L821 Other seborrheic keratosis: Secondary | ICD-10-CM | POA: Diagnosis not present

## 2017-03-06 DIAGNOSIS — L570 Actinic keratosis: Secondary | ICD-10-CM | POA: Diagnosis not present

## 2017-03-06 DIAGNOSIS — L820 Inflamed seborrheic keratosis: Secondary | ICD-10-CM | POA: Diagnosis not present

## 2017-03-07 ENCOUNTER — Encounter: Payer: Self-pay | Admitting: Family Medicine

## 2017-03-15 ENCOUNTER — Telehealth: Payer: Self-pay | Admitting: Family Medicine

## 2017-03-15 NOTE — Telephone Encounter (Signed)
Patient's dexa scan order from last year is expiring Please call her, encourage her to have this done; if she agrees, please order Thank you

## 2017-03-16 NOTE — Telephone Encounter (Signed)
Patient wants to hold off for now and discuss at her next visit.

## 2017-04-17 ENCOUNTER — Telehealth: Payer: Self-pay

## 2017-04-17 ENCOUNTER — Other Ambulatory Visit: Payer: Self-pay

## 2017-04-17 NOTE — Patient Outreach (Signed)
[facility]) Care Management  04/17/2017  BRINLEE GAMBRELL 09-Jun-1929 [id number]   Medication Adherence call to Mrs. Randall An Mrs. Hirschi is showing under [facility] that she is past due on her valsartan 80 mg Mrs. Tout said that doctor cut it back to 1/2 tablet daily instead of 1 tablet ,I call doctors office and verify with patient nurse that this was correct and nurse said she is to be on 1/2 tablet daily per doctor Lada.    [place] Management Direct Dial [phone]  Fax [phone] [email or web address]

## 2017-04-17 NOTE — Telephone Encounter (Signed)
Samantha Dawson from [facility] is making sure that  Her pharmacy has the right dose and instructions; Valsartan 80 mg tablet ( take 0.5 tablet daily)  as its prescribed. Samantha Dawson states she spoke with the pt and she is taking it as prescribe but she want to make sure that her pharmacy has it as well

## 2017-04-25 ENCOUNTER — Other Ambulatory Visit: Payer: Self-pay

## 2017-04-25 MED ORDER — ALBUTEROL SULFATE HFA 108 (90 BASE) MCG/ACT IN AERS: 2.0000 | INHALATION_SPRAY | Freq: Four times a day (QID) | RESPIRATORY_TRACT | 0 refills | Status: DC | PRN

## 2017-04-28 ENCOUNTER — Other Ambulatory Visit: Payer: Self-pay | Admitting: Family Medicine

## 2017-05-22 ENCOUNTER — Telehealth: Payer: Self-pay | Admitting: Family Medicine

## 2017-05-22 MED ORDER — LOSARTAN POTASSIUM 25 MG PO TABS: 25.0000 mg | ORAL_TABLET | Freq: Every day | ORAL | 5 refills | Status: DC

## 2017-05-22 NOTE — Telephone Encounter (Signed)
Shae from [employer] has been taken off the shelf as of today. Would like to know what you would like to do to replace the medication. Please fax over the new script. (P) [phone] (F) [phone]

## 2017-05-24 ENCOUNTER — Other Ambulatory Visit: Payer: Self-pay | Admitting: Family Medicine

## 2017-05-29 ENCOUNTER — Other Ambulatory Visit: Payer: Self-pay | Admitting: Family Medicine

## 2017-05-29 ENCOUNTER — Encounter: Payer: Self-pay | Admitting: Family Medicine

## 2017-05-29 ENCOUNTER — Ambulatory Visit (INDEPENDENT_AMBULATORY_CARE_PROVIDER_SITE_OTHER): Payer: Medicare Other | Admitting: Family Medicine

## 2017-05-29 VITALS — BP 104/62 | HR 87 | Temp 97.5°F | Resp 16 | Wt 122.4 lb

## 2017-05-29 DIAGNOSIS — D485 Neoplasm of uncertain behavior of skin: Secondary | ICD-10-CM

## 2017-05-29 DIAGNOSIS — Z87891 Personal history of nicotine dependence: Secondary | ICD-10-CM | POA: Diagnosis not present

## 2017-05-29 DIAGNOSIS — E782 Mixed hyperlipidemia: Secondary | ICD-10-CM | POA: Diagnosis not present

## 2017-05-29 DIAGNOSIS — Z79899 Other long term (current) drug therapy: Secondary | ICD-10-CM | POA: Diagnosis not present

## 2017-05-29 DIAGNOSIS — Z5181 Encounter for therapeutic drug level monitoring: Secondary | ICD-10-CM | POA: Diagnosis not present

## 2017-05-29 DIAGNOSIS — R739 Hyperglycemia, unspecified: Secondary | ICD-10-CM | POA: Diagnosis not present

## 2017-05-29 DIAGNOSIS — E041 Nontoxic single thyroid nodule: Secondary | ICD-10-CM

## 2017-05-29 DIAGNOSIS — I10 Essential (primary) hypertension: Secondary | ICD-10-CM

## 2017-05-29 DIAGNOSIS — M5134 Other intervertebral disc degeneration, thoracic region: Secondary | ICD-10-CM

## 2017-05-29 DIAGNOSIS — N393 Stress incontinence (female) (male): Secondary | ICD-10-CM

## 2017-05-29 DIAGNOSIS — G894 Chronic pain syndrome: Secondary | ICD-10-CM | POA: Diagnosis not present

## 2017-05-29 LAB — LIPID PANEL
CHOL/HDL RATIO: 2.7 ratio (ref <5.0)
Cholesterol: 150 mg/dL (ref <200)
HDL: 56 mg/dL (ref >50)
LDL CALC: 63 mg/dL (ref <100)
Triglycerides: 157 mg/dL — ABNORMAL HIGH (ref <150)
VLDL: 31 mg/dL — AB (ref <30)

## 2017-05-29 LAB — COMPLETE METABOLIC PANEL WITH GFR
ALT: 13 U/L (ref 6–29)
AST: 19 U/L (ref 10–35)
Albumin: 4.5 g/dL (ref 3.6–5.1)
Alkaline Phosphatase: 105 U/L (ref 33–130)
BUN: 17 mg/dL (ref 7–25)
CHLORIDE: 106 mmol/L (ref 98–110)
CO2: 25 mmol/L (ref 20–31)
CREATININE: 1.01 mg/dL — AB (ref 0.60–0.88)
Calcium: 9.5 mg/dL (ref 8.6–10.4)
GFR, Est African American: 59 mL/min — ABNORMAL LOW (ref >=60)
GFR, Est Non African American: 51 mL/min — ABNORMAL LOW (ref >=60)
Glucose, Bld: 103 mg/dL — ABNORMAL HIGH (ref 65–99)
Potassium: 5.1 mmol/L (ref 3.5–5.3)
Sodium: 142 mmol/L (ref 135–146)
Total Bilirubin: 0.8 mg/dL (ref 0.2–1.2)
Total Protein: 7.2 g/dL (ref 6.1–8.1)

## 2017-05-29 LAB — TSH: TSH: 2.16 m[IU]/L

## 2017-05-29 MED ORDER — LOSARTAN POTASSIUM 25 MG PO TABS: 12.5000 mg | ORAL_TABLET | Freq: Every day | ORAL | 5 refills | Status: DC

## 2017-05-29 MED ORDER — HYDROCODONE-ACETAMINOPHEN 10-325 MG PO TABS: 1.0000 | ORAL_TABLET | Freq: Four times a day (QID) | ORAL | 0 refills | Status: DC | PRN

## 2017-05-29 NOTE — Assessment & Plan Note (Signed)
Limit saturated fats; check lipids today; continue statin 

## 2017-05-29 NOTE — Progress Notes (Signed)
BP 104/62   Pulse 87   Temp (!) 97.5 F (36.4 C) (Oral)   Resp 16   Wt 122 lb 6.4 oz (55.5 kg)   SpO2 92%   BMI 21.01 kg/m    Subjective:    Patient ID: Samantha Dawson, female    DOB: 1929-06-26, 83 y.o.   MRN: [id number]  HPI: ADAMARY SAVARY is a 83 y.o. female  Chief Complaint  Patient presents with  . Follow-up    HPI Patient is here for f/u Every time she sneezes or coughs, sudden movements, she leaks Going on for a good while She forgot last time; going on for months; just a little bit leaks Not sure about any smell Valsartan is on recall, that caused it to smell No lower back or abd pain that is new No fevers; no blood in the urine She is already on the other medicine, 25 mg daily losartan  Chronic pain, UDS just done in April, consistent Hydrocodone; does not make her goofy, or loopy, or drunk No problems with constipation The medicine helps her back pain; improves her quality of life She is not interested in weaning down or off at this time  On plavix, no bruising or bleeding  Had thyroid nodule, seeing ENT, Dr. Richardson Landry; doesn't bother her; no problems with swallowing  COPD; breathing is doing okay  Hx of hyperglycemia; some dry mouth but likely medicine; does have vision problems, but has macular degeneration, doesn't do much driving; on vitamins; seeing Dr. Ellin Mayhew  On statin, no bad muscle aches; knows to limit fatty meats Lab Results  Component Value Date   CHOL 132 11/29/2016   HDL 57 11/29/2016   LDLCALC 15 11/29/2016   TRIG 302 (H) 11/29/2016   CHOLHDL 2.3 11/29/2016     Depression screen PHQ 2/9 05/29/2017 02/27/2017 11/29/2016 10/09/2016 09/08/2016  Decreased Interest 0 0 0 0 0  Down, Depressed, Hopeless 0 0 0 0 0  PHQ - 2 Score 0 0 0 0 0    Relevant past medical, surgical, family and social history reviewed Past Medical History:  Diagnosis Date  . Acid reflux 10/15/2016  . Anxiety   . Controlled substance agreement signed 03/22/2016   . 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 Encounter for chronic pain management   . Hx of smoking 04/17/2016  . Hyperlipidemia   . Hypertension   . Osteoarthritis   . Osteoporosis 03/22/2016  . Primary osteoarthritis of both hands 05/03/2016   Past Surgical History:  Procedure Laterality Date  . ABDOMINAL HYSTERECTOMY    . CATARACT EXTRACTION    . endaryerectomy    . renal stenting     Family History  Problem Relation Age of Onset  . Cancer Mother   . Coronary artery disease Father   . Cerebrovascular Accident Father   . Cancer Sister        breast cancer  . Cancer Brother        stomach  . Heart disease Daughter        heart vavle bypass?  . Stroke Sister   . Cancer Sister        stomach  . Cancer Brother        unknown  . Lung disease Brother   . Arthritis Daughter    Social History   Social History  . Marital status: Widowed    Spouse name: N/A  . Number of children: N/A  . Years of education: N/A   Occupational  History  . Not on file.   Social History Main Topics  . Smoking status: Former Smoker    Packs/day: 0.20    Types: Cigarettes  . Smokeless tobacco: Never Used     Comment: Haven't smoke since she went to the [facility] 09-02-16  . Alcohol use No  . Drug use: No  . Sexual activity: No   Other Topics Concern  . Not on file   Social History Narrative  . No narrative on file    Interim medical history since last visit reviewed. Allergies and medications reviewed  Review of Systems Per HPI unless specifically indicated above     Objective:    BP 104/62   Pulse 87   Temp (!) 97.5 F (36.4 C) (Oral)   Resp 16   Wt 122 lb 6.4 oz (55.5 kg)   SpO2 92%   BMI 21.01 kg/m   Wt Readings from Last 3 Encounters:  05/29/17 122 lb 6.4 oz (55.5 kg)  02/27/17 119 lb 4.8 oz (54.1 kg)  01/01/17 117 lb 12.8 oz (53.4 kg)    Physical Exam  Constitutional: She appears well-developed and well-nourished. No distress.  Eyes: EOM are normal. No  scleral icterus.  Neck: No thyroid mass and no thyromegaly present.  Cardiovascular: Normal rate, regular rhythm and normal heart sounds.   No murmur heard. Pulmonary/Chest: Effort normal and breath sounds normal. She has no wheezes.  Abdominal: Soft. She exhibits no distension.  Musculoskeletal: Normal range of motion. She exhibits no edema.       Thoracic back: She exhibits deformity (kyphosis). She exhibits no bony tenderness.       Right hand: She exhibits deformity (heberdens and bouchards nodes several fingers).       Left hand: She exhibits deformity (heberdens and bouchards nodes several fingers).  Neurological: She is alert. She exhibits normal muscle tone. Coordination and gait normal.  Ambulatory without assistive device  Skin: Skin is warm and dry. She is not diaphoretic. No pallor.  Erythematous lesion on dorsum of RIGHT foot, keratotic center  Psychiatric: She has a normal mood and affect. Her behavior is normal. Judgment and thought content normal. Her mood appears not anxious. Cognition and memory are not impaired. She does not exhibit a depressed mood.    Results for orders placed or performed in visit on 11/29/16  COMPLETE METABOLIC PANEL WITH GFR  Result Value Ref Range   Sodium 144 135 - 146 mmol/L   Potassium 5.3 3.5 - 5.3 mmol/L   Chloride 109 98 - 110 mmol/L   CO2 27 20 - 31 mmol/L   Glucose, Bld 101 (H) 65 - 99 mg/dL   BUN 18 7 - 25 mg/dL   Creat 1.01 (H) 0.60 - 0.88 mg/dL   Total Bilirubin 1.0 0.2 - 1.2 mg/dL   Alkaline Phosphatase 99 33 - 130 U/L   AST 20 10 - 35 U/L   ALT 9 6 - 29 U/L   Total Protein 6.9 6.1 - 8.1 g/dL   Albumin 4.1 3.6 - 5.1 g/dL   Calcium 9.9 8.6 - 10.4 mg/dL   GFR, Est African American 59 (L) >=60 mL/min   GFR, Est Non African American 51 (L) >=60 mL/min  Lipid panel  Result Value Ref Range   Cholesterol 132 <200 mg/dL   Triglycerides 302 (H) <150 mg/dL   HDL 57 >50 mg/dL   Total CHOL/HDL Ratio 2.3 <5.0 Ratio   VLDL 60 (H) <30  mg/dL   LDL Cholesterol 15 <  100 mg/dL  CBC with Differential/Platelet  Result Value Ref Range   WBC 6.7 3.8 - 10.8 K/uL   RBC 3.94 3.80 - 5.10 MIL/uL   Hemoglobin 12.8 11.7 - 15.5 g/dL   HCT 39.5 35.0 - 45.0 %   MCV 100.3 (H) 80.0 - 100.0 fL   MCH 32.5 27.0 - 33.0 pg   MCHC 32.4 32.0 - 36.0 g/dL   RDW 13.4 11.0 - 15.0 %   Platelets 188 140 - 400 K/uL   MPV 10.0 7.5 - 12.5 fL   Neutro Abs 3,350 1,500 - 7,800 cells/uL   Lymphs Abs 2,613 850 - 3,900 cells/uL   Monocytes Absolute 536 200 - 950 cells/uL   Eosinophils Absolute 134 15 - 500 cells/uL   Basophils Absolute 67 0 - 200 cells/uL   Neutrophils Relative % 50 %   Lymphocytes Relative 39 %   Monocytes Relative 8 %   Eosinophils Relative 2 %   Basophils Relative 1 %   Smear Review Criteria for review not met       Assessment & Plan:   Problem List Items Addressed This Visit      Cardiovascular and Mediastinum   Essential hypertension    BP is now low on the new medicine, so we'll cut that in half; patient to call if still low energy, recheck BP with CMA in 2 weeks      Relevant Medications   losartan (COZAAR) 25 MG tablet     Endocrine   Left thyroid nodule    Managed by ENT      Relevant Orders   TSH     Musculoskeletal and Integument   Degenerative disc disease, thoracic    Managed with narcotics here      Relevant Medications   HYDROcodone-acetaminophen (NORCO) 10-325 MG tablet     Other   Medication monitoring encounter    Check liver and kidneys      Relevant Orders   COMPLETE METABOLIC PANEL WITH GFR   Hyperlipidemia    Limit saturated fats; check lipids today; continue statin      Relevant Medications   losartan (COZAAR) 25 MG tablet   Other Relevant Orders   Lipid panel   Hyperglycemia    Check glucose and A1c today      Relevant Orders   Hemoglobin A1c   Hx of smoking    Not picking back up      Controlled substance agreement signed    Contract UTD, UDS UTD      Chronic pain      Managed here; no need for pain clinic at this time      Relevant Medications   HYDROcodone-acetaminophen (NORCO) 10-325 MG tablet    Other Visit Diagnoses    Stress incontinence of urine    -  Primary   Relevant Orders   Urinalysis w microscopic + reflex cultur   Neoplasm of uncertain behavior of skin       Relevant Orders   Ambulatory referral to Dermatology      [place] web site reviewed over last 12 monhts; no early refills, no red flags, no concern for misuse or diversion  Follow up plan: Return in about 2 weeks (around 06/12/2017) for blood pressure recheck with CMA; 3 months with Dr. Sanda Klein, no labs needed.  An after-visit summary was printed and given to the patient at [place].  Please see the patient instructions which may contain other information and recommendations beyond what is mentioned above in  the assessment and plan.  Meds ordered this encounter  Medications  . losartan (COZAAR) 25 MG tablet    Sig: Take 0.5 tablets (12.5 mg total) by mouth daily.    Dispense:  15 tablet    Refill:  5    We're decreasing the dose a little; use this RX  . DISCONTD: HYDROcodone-acetaminophen (NORCO) 10-325 MG tablet    Sig: Take 1 tablet by mouth every 6 (six) hours as needed. #1 may fill now    Dispense:  105 tablet    Refill:  0  . DISCONTD: HYDROcodone-acetaminophen (NORCO) 10-325 MG tablet    Sig: Take 1 tablet by mouth every 6 (six) hours as needed. #2 may fill on or after June 28, 2017    Dispense:  105 tablet    Refill:  0  . HYDROcodone-acetaminophen (NORCO) 10-325 MG tablet    Sig: Take 1 tablet by mouth every 6 (six) hours as needed. #3 may fill on or after Sept 22, 2018    Dispense:  105 tablet    Refill:  0    Orders Placed This Encounter  Procedures  . Urinalysis w microscopic + reflex cultur  . COMPLETE METABOLIC PANEL WITH GFR  . Hemoglobin A1c  . Lipid panel  . TSH  . Ambulatory referral to Dermatology

## 2017-05-29 NOTE — Assessment & Plan Note (Signed)
BP is now low on the new medicine, so we'll cut that in half; patient to call if still low energy, recheck BP with CMA in 2 weeks

## 2017-05-29 NOTE — Assessment & Plan Note (Signed)
Not picking back up

## 2017-05-29 NOTE — Patient Instructions (Addendum)
Try Kegel exercises at home on your own If symptoms continue, then call and we'll be glad to refer you to a pelvic floor specialist  Decrease your losartan to just half of a pill daily Return to see one of the CMAs in 2 weeks for a quick blood pressure check    Kegel Exercises Kegel exercises help strengthen the muscles that support the rectum, vagina, small intestine, bladder, and uterus. Doing Kegel exercises can help:  Improve bladder and bowel control.  Improve sexual response.  Reduce problems and discomfort during pregnancy.  Kegel exercises involve squeezing your pelvic floor muscles, which are the same muscles you squeeze when you try to stop the flow of urine. The exercises can be done while sitting, standing, or lying down, but it is best to vary your position. Phase 1 exercises 1. Squeeze your pelvic floor muscles tight. You should feel a tight lift in your rectal area. If you are a female, you should also feel a tightness in your vaginal area. Keep your stomach, buttocks, and legs relaxed. 2. Hold the muscles tight for up to 10 seconds. 3. Relax your muscles. Repeat this exercise 50 times a day or as many times as told by your health care provider. Continue to do this exercise for at least 4-6 weeks or for as long as told by your health care provider. This information is not intended to replace advice given to you by your health care provider. Make sure you discuss any questions you have with your health care provider. Document Released: 10/09/2012 Document Revised: 06/17/2016 Document Reviewed: 09/12/2015 Elsevier Interactive Patient Education  [employer].

## 2017-05-29 NOTE — Assessment & Plan Note (Signed)
Check liver and kidneys 

## 2017-05-29 NOTE — Assessment & Plan Note (Signed)
Managed with narcotics here

## 2017-05-29 NOTE — Assessment & Plan Note (Signed)
Managed here; no need for pain clinic at this time

## 2017-05-29 NOTE — Assessment & Plan Note (Signed)
Contract UTD, UDS UTD

## 2017-05-29 NOTE — Assessment & Plan Note (Signed)
Managed by ENT 

## 2017-05-29 NOTE — Assessment & Plan Note (Signed)
Check glucose and A1c today 

## 2017-05-30 LAB — URINALYSIS W MICROSCOPIC + REFLEX CULTURE
BACTERIA UA: NONE SEEN [HPF]
BILIRUBIN URINE: NEGATIVE
Casts: NONE SEEN [LPF]
Crystals: NONE SEEN [HPF]
GLUCOSE, UA: NEGATIVE
HGB URINE DIPSTICK: NEGATIVE
KETONES UR: NEGATIVE
LEUKOCYTES UA: NEGATIVE
NITRITE: NEGATIVE
PH: 5 (ref 5.0–8.0)
Protein, ur: NEGATIVE
Specific Gravity, Urine: 1.025 (ref 1.001–1.035)
WBC UA: NONE SEEN WBC/HPF (ref <=5)
Yeast: NONE SEEN [HPF]

## 2017-05-30 LAB — HEMOGLOBIN A1C
Hgb A1c MFr Bld: 5.9 % — ABNORMAL HIGH (ref <5.7)
Mean Plasma Glucose: 123 mg/dL

## 2017-06-12 ENCOUNTER — Ambulatory Visit: Payer: Medicare Other

## 2017-06-12 VITALS — BP 102/64 | HR 75

## 2017-06-12 DIAGNOSIS — I10 Essential (primary) hypertension: Secondary | ICD-10-CM

## 2017-06-25 ENCOUNTER — Other Ambulatory Visit: Payer: Self-pay | Admitting: Family Medicine

## 2017-06-29 ENCOUNTER — Other Ambulatory Visit: Payer: Self-pay | Admitting: Family Medicine

## 2017-06-29 NOTE — Telephone Encounter (Signed)
Another request came through for SABA I just approved one plus one refill 06/25/17 which I would hope would last quite some time I declined the request for 3 inhalers at a time and asked the pharmacist to send me a fill hx of SABA over the last 12 months

## 2017-07-03 DIAGNOSIS — H353131 Nonexudative age-related macular degeneration, bilateral, early dry stage: Secondary | ICD-10-CM | POA: Diagnosis not present

## 2017-07-16 DIAGNOSIS — L570 Actinic keratosis: Secondary | ICD-10-CM | POA: Diagnosis not present

## 2017-07-16 DIAGNOSIS — L821 Other seborrheic keratosis: Secondary | ICD-10-CM | POA: Diagnosis not present

## 2017-07-16 DIAGNOSIS — Z85828 Personal history of other malignant neoplasm of skin: Secondary | ICD-10-CM | POA: Diagnosis not present

## 2017-07-16 DIAGNOSIS — L820 Inflamed seborrheic keratosis: Secondary | ICD-10-CM | POA: Diagnosis not present

## 2017-07-25 ENCOUNTER — Other Ambulatory Visit: Payer: Self-pay | Admitting: Family Medicine

## 2017-07-25 NOTE — Telephone Encounter (Signed)
Last sgpt and lipids reviewed; Rx approved 

## 2017-08-17 ENCOUNTER — Other Ambulatory Visit: Payer: Self-pay | Admitting: Otolaryngology

## 2017-08-17 DIAGNOSIS — E041 Nontoxic single thyroid nodule: Secondary | ICD-10-CM | POA: Diagnosis not present

## 2017-08-17 DIAGNOSIS — H6983 Other specified disorders of Eustachian tube, bilateral: Secondary | ICD-10-CM | POA: Diagnosis not present

## 2017-08-21 ENCOUNTER — Ambulatory Visit: Payer: Medicare Other

## 2017-08-23 ENCOUNTER — Ambulatory Visit
Admission: RE | Admit: 2017-08-23 | Discharge: 2017-08-23 | Disposition: A | Discharge status: Home / Self Care | Payer: Medicare Other | Source: Ambulatory Visit | Attending: Otolaryngology | Admitting: Otolaryngology

## 2017-08-23 ENCOUNTER — Other Ambulatory Visit: Payer: Self-pay | Admitting: Family Medicine

## 2017-08-23 DIAGNOSIS — E042 Nontoxic multinodular goiter: Secondary | ICD-10-CM | POA: Insufficient documentation

## 2017-08-23 DIAGNOSIS — E041 Nontoxic single thyroid nodule: Secondary | ICD-10-CM | POA: Diagnosis present

## 2017-08-24 ENCOUNTER — Other Ambulatory Visit: Payer: Self-pay | Admitting: Family Medicine

## 2017-08-24 NOTE — Telephone Encounter (Signed)
Called pt no answer. LM for pt informing her of the information below.  

## 2017-08-24 NOTE — Telephone Encounter (Signed)
Will respectfully defer all pulmonary medicines / inhalers to lung specialist

## 2017-08-24 NOTE — Telephone Encounter (Signed)
I'm going to defer any additional refills of SABA to her pulmonologist; I don't mind an occasional refill in their stead, but she has apparently run though the inhaler plus the refill in two months already, and he needs to be aware of her frequent SABA use; ask her to contact her lung doctor for her inhaler; thank you

## 2017-08-27 ENCOUNTER — Other Ambulatory Visit: Payer: Self-pay

## 2017-08-27 MED ORDER — ALBUTEROL SULFATE HFA 108 (90 BASE) MCG/ACT IN AERS: 2.0000 | INHALATION_SPRAY | Freq: Four times a day (QID) | RESPIRATORY_TRACT | 1 refills | Status: DC | PRN

## 2017-08-29 ENCOUNTER — Ambulatory Visit (INDEPENDENT_AMBULATORY_CARE_PROVIDER_SITE_OTHER): Payer: Medicare Other | Admitting: Family Medicine

## 2017-08-29 ENCOUNTER — Encounter: Payer: Self-pay | Admitting: Family Medicine

## 2017-08-29 ENCOUNTER — Other Ambulatory Visit: Payer: Self-pay | Admitting: Otolaryngology

## 2017-08-29 VITALS — BP 110/64 | HR 79 | Temp 97.7°F | Wt 123.7 lb

## 2017-08-29 DIAGNOSIS — E041 Nontoxic single thyroid nodule: Secondary | ICD-10-CM | POA: Diagnosis not present

## 2017-08-29 DIAGNOSIS — J431 Panlobular emphysema: Secondary | ICD-10-CM | POA: Diagnosis not present

## 2017-08-29 DIAGNOSIS — Z23 Encounter for immunization: Secondary | ICD-10-CM

## 2017-08-29 DIAGNOSIS — M25512 Pain in left shoulder: Secondary | ICD-10-CM

## 2017-08-29 DIAGNOSIS — M5134 Other intervertebral disc degeneration, thoracic region: Secondary | ICD-10-CM | POA: Diagnosis not present

## 2017-08-29 DIAGNOSIS — M546 Pain in thoracic spine: Secondary | ICD-10-CM

## 2017-08-29 DIAGNOSIS — F1190 Opioid use, unspecified, uncomplicated: Secondary | ICD-10-CM

## 2017-08-29 DIAGNOSIS — G8929 Other chronic pain: Secondary | ICD-10-CM

## 2017-08-29 MED ORDER — HYDROCODONE-ACETAMINOPHEN 10-325 MG PO TABS: 1.0000 | ORAL_TABLET | Freq: Four times a day (QID) | ORAL | 0 refills | Status: DC | PRN

## 2017-08-29 MED ORDER — ALBUTEROL SULFATE HFA 108 (90 BASE) MCG/ACT IN AERS: 2.0000 | INHALATION_SPRAY | Freq: Four times a day (QID) | RESPIRATORY_TRACT | 5 refills | Status: DC | PRN

## 2017-08-29 NOTE — Assessment & Plan Note (Signed)
Patient does not want to return to see the pulmonologist; she wishes to just refill the SABA; she'll continue bevespi

## 2017-08-29 NOTE — Patient Instructions (Signed)
Return in 3 months for next visit We'll have you see the orthopaedist

## 2017-08-29 NOTE — Progress Notes (Signed)
BP 110/64 (BP Location: Left Arm, Patient Position: Sitting, Cuff Size: Normal)   Pulse 79   Temp 97.7 F (36.5 C) (Oral)   Wt 123 lb 11.2 oz (56.1 kg)   SpO2 95%   BMI 21.23 kg/m    Subjective:    Patient ID: Samantha Dawson, female    DOB: July 14, 1929, 83 y.o.   MRN: [id number]  HPI: Samantha Dawson is a 83 y.o. female  Chief Complaint  Patient presents with  . Immunizations    flu shot today   . Follow-up    HPI Patient is here for f/u today of chronic pain; thoracic region; location is the same; at times a little worse; worse when the weather changes; no weakness down the buttocks or legs; no loss of B/B; medicine does not cause her to feel overmedicated; she has a bowel regimen to prevent constipation; takes stool softener; last dose of medicine was this morning, around 9 am  She has left shoulder pain x 4-5 weeks after pulling weeds; right-handed; tried heating pad, helped at the time; no topicals for this episode; limited in reaching up and behind and across  She has COPD; using the bevespi; one SABA rescue inhaler doesn't even last a month; she is not seeing a lung specialist any more; she saw Dr. Alva Garnet earlier in the year; reviewed his note; return PRN; she does not want to go back and just wants refills of her medicines  She has thyroid nodules and is seeing ENT (Dr. Richardson Landry) for these; she just had a thyroid US done on 08/23/17, report reviewed, impression copied and pasted below: IMPRESSION: 1. Slight interval enlargement of bilateral thyroid nodules. Neither meets criteria for biopsy. Recommend 1-2 year follow-up ultrasound, until stability x5 years demonstrated.  The above is in keeping with the ACR TI-RADS recommendations - J Am Coll Radiol 2017;14:587-595.   Electronically Signed   By: Lucrezia Europe M.D.   On: 08/23/2017 15:42  In July, we checked her A1c, lipid panel, etc.; her LDL was 63 on statin; A1c was 5.9 in the prediabetes range; creatinine was  stable, labs dated 05/29/17 reviewed  Depression screen [facility] 2/9 08/29/2017 05/29/2017 02/27/2017 11/29/2016 10/09/2016  Decreased Interest 0 0 0 0 0  Down, Depressed, Hopeless 0 0 0 0 0  PHQ - 2 Score 0 0 0 0 0    Relevant past medical, surgical, family and social history reviewed Past Medical History:  Diagnosis Date  . Acid reflux 10/15/2016  . Anxiety   . Controlled substance agreement signed 03/22/2016  . 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 Encounter for chronic pain management   . Hx of smoking 04/17/2016  . Hyperlipidemia   . Hypertension   . Osteoarthritis   . Osteoporosis 03/22/2016  . Primary osteoarthritis of both hands 05/03/2016   Past Surgical History:  Procedure Laterality Date  . ABDOMINAL HYSTERECTOMY    . CATARACT EXTRACTION    . endaryerectomy    . renal stenting     Family History  Problem Relation Age of Onset  . Cancer Mother   . Coronary artery disease Father   . Cerebrovascular Accident Father   . Cancer Sister        breast cancer  . Cancer Brother        stomach  . Heart disease Daughter        heart vavle bypass?  . Stroke Sister   . Cancer Sister  stomach  . Cancer Brother        unknown  . Lung disease Brother   . Arthritis Daughter    Social History   Social History  . Marital status: Widowed    Spouse name: N/A  . Number of children: N/A  . Years of education: N/A   Occupational History  . Not on file.   Social History Main Topics  . Smoking status: Former Smoker    Packs/day: 0.20    Types: Cigarettes  . Smokeless tobacco: Never Used     Comment: Haven't smoke since she went to the [facility] 09-02-16  . Alcohol use No  . Drug use: No  . Sexual activity: No   Other Topics Concern  . Not on file   Social History Narrative  . No narrative on file    Interim medical history since last visit reviewed. Allergies and medications reviewed  Review of Systems Per HPI unless specifically indicated above       Objective:    BP 110/64 (BP Location: Left Arm, Patient Position: Sitting, Cuff Size: Normal)   Pulse 79   Temp 97.7 F (36.5 C) (Oral)   Wt 123 lb 11.2 oz (56.1 kg)   SpO2 95%   BMI 21.23 kg/m   Wt Readings from Last 3 Encounters:  08/29/17 123 lb 11.2 oz (56.1 kg)  05/29/17 122 lb 6.4 oz (55.5 kg)  02/27/17 119 lb 4.8 oz (54.1 kg)    Physical Exam  Constitutional: She appears well-developed and well-nourished. No distress.  Eyes: EOM are normal. No scleral icterus.  Neck: No thyroid mass and no thyromegaly present.  Cardiovascular: Normal rate, regular rhythm and normal heart sounds.   No murmur heard. Pulmonary/Chest: Effort normal and breath sounds normal. She has no wheezes.  Abdominal: Soft. She exhibits no distension.  Musculoskeletal: She exhibits no edema.       Left shoulder: She exhibits decreased range of motion. She exhibits no tenderness, no bony tenderness, no swelling, no effusion and no deformity.       Thoracic back: She exhibits tenderness, bony tenderness and deformity (kyphosis).       Back:       Right hand: She exhibits deformity (heberdens and bouchards nodes several fingers).       Left hand: She exhibits deformity (heberdens and bouchards nodes several fingers).  Difficulty abducting, reaching behind, and reaching across with the LEFT arm  Neurological: She is alert. She exhibits normal muscle tone. Coordination and gait normal.  Ambulatory without assistive device  Skin: No pallor.  Psychiatric: She has a normal mood and affect. Her behavior is normal. Judgment and thought content normal. Her mood appears not anxious. Cognition and memory are not impaired. She does not exhibit a depressed mood.    Results for orders placed or performed in visit on 05/29/17  Urinalysis w microscopic + reflex cultur  Result Value Ref Range   Color, Urine YELLOW YELLOW   APPearance CLEAR CLEAR   Specific Gravity, Urine 1.025 1.001 - 1.035   pH 5.0 5.0 - 8.0    Glucose, UA NEGATIVE NEGATIVE   Bilirubin Urine NEGATIVE NEGATIVE   Ketones, ur NEGATIVE NEGATIVE   Hgb urine dipstick NEGATIVE NEGATIVE   Protein, ur NEGATIVE NEGATIVE   Nitrite NEGATIVE NEGATIVE   Leukocytes, UA NEGATIVE NEGATIVE   WBC, UA NONE SEEN <=5 WBC/HPF   RBC / HPF 0-2 <=2 RBC/HPF   Squamous Epithelial / LPF 0-5 <=5 HPF   Bacteria, UA  NONE SEEN NONE SEEN HPF   Crystals NONE SEEN NONE SEEN HPF   Casts NONE SEEN NONE SEEN LPF   Yeast NONE SEEN NONE SEEN HPF  COMPLETE METABOLIC PANEL WITH GFR  Result Value Ref Range   Sodium 142 135 - 146 mmol/L   Potassium 5.1 3.5 - 5.3 mmol/L   Chloride 106 98 - 110 mmol/L   CO2 25 20 - 31 mmol/L   Glucose, Bld 103 (H) 65 - 99 mg/dL   BUN 17 7 - 25 mg/dL   Creat 1.01 (H) 0.60 - 0.88 mg/dL   Total Bilirubin 0.8 0.2 - 1.2 mg/dL   Alkaline Phosphatase 105 33 - 130 U/L   AST 19 10 - 35 U/L   ALT 13 6 - 29 U/L   Total Protein 7.2 6.1 - 8.1 g/dL   Albumin 4.5 3.6 - 5.1 g/dL   Calcium 9.5 8.6 - 10.4 mg/dL   GFR, Est African American 59 (L) >=60 mL/min   GFR, Est Non African American 51 (L) >=60 mL/min  Hemoglobin A1c  Result Value Ref Range   Hgb A1c MFr Bld 5.9 (H) <5.7 %   Mean Plasma Glucose 123 mg/dL  Lipid panel  Result Value Ref Range   Cholesterol 150 <200 mg/dL   Triglycerides 157 (H) <150 mg/dL   HDL 56 >50 mg/dL   Total CHOL/HDL Ratio 2.7 <5.0 Ratio   VLDL 31 (H) <30 mg/dL   LDL Cholesterol 63 <100 mg/dL  TSH  Result Value Ref Range   TSH 2.16 mIU/L      Assessment & Plan:   Problem List Items Addressed This Visit      Respiratory   Panlobular emphysema ([place])    Patient does not want to return to see the pulmonologist; she wishes to just refill the SABA; she'll continue bevespi      Relevant Medications   fluticasone (FLONASE) 50 MCG/ACT nasal spray   albuterol (PROAIR HFA) 108 (90 Base) MCG/ACT inhaler     Endocrine   Left thyroid nodule    Managed by ENT, Dr. Richardson Landry; recent US October 2018 reviewed          Musculoskeletal and Integument   Degenerative disc disease, thoracic    Seen today for chronic pain management      Relevant Medications   HYDROcodone-acetaminophen (NORCO) 10-325 MG tablet   HYDROcodone-acetaminophen (NORCO) 10-325 MG tablet   HYDROcodone-acetaminophen (NORCO) 10-325 MG tablet     Other   Uncomplicated opioid use    Discussed risk of benzo plus opioid use again, gave her copy of the Jul 07, 2015 press release; see patient regularly every 3 months; no concern for misuse or diversion at this time      Shoulder pain, left    Refer to ortho; concern about rotator cuff vs impingement syndrome; okay for cortisone injection if ortho thinks would be helpful      Relevant Orders   Ambulatory referral to Orthopedic Surgery   Back pain - Primary    Patient seen today for back pain      Relevant Medications   HYDROcodone-acetaminophen (NORCO) 10-325 MG tablet   HYDROcodone-acetaminophen (NORCO) 10-325 MG tablet   HYDROcodone-acetaminophen (NORCO) 10-325 MG tablet    Other Visit Diagnoses    Flu vaccine need       Relevant Orders   Flu vaccine HIGH DOSE PF (Fluzone High dose) (Completed)       Follow up plan: No Follow-up on file.  An after-visit summary  was printed and given to the patient at [place].  Please see the patient instructions which may contain other information and recommendations beyond what is mentioned above in the assessment and plan.  Meds ordered this encounter  Medications  . fluticasone (FLONASE) 50 MCG/ACT nasal spray    Sig: Place 2 sprays into both nostrils as needed.   Marland Kitchen albuterol (PROAIR HFA) 108 (90 Base) MCG/ACT inhaler    Sig: Inhale 2 puffs into the lungs every 6 (six) hours as needed for wheezing or shortness of breath.    Dispense:  1 Inhaler    Refill:  5  . HYDROcodone-acetaminophen (NORCO) 10-325 MG tablet    Sig: Take 1 tablet by mouth every 6 (six) hours as needed. #1 may fill now    Dispense:  105 tablet     Refill:  0  . HYDROcodone-acetaminophen (NORCO) 10-325 MG tablet    Sig: Take 1 tablet by mouth every 6 (six) hours as needed. Rx #2 fill on or after Sep 28, 2017    Dispense:  105 tablet    Refill:  0  . HYDROcodone-acetaminophen (NORCO) 10-325 MG tablet    Sig: Take 1 tablet by mouth every 6 (six) hours as needed. Rx #3 fill no or after Oct 28, 2017    Dispense:  105 tablet    Refill:  0    Orders Placed This Encounter  Procedures  . Flu vaccine HIGH DOSE PF (Fluzone High dose)  . Ambulatory referral to Orthopedic Surgery

## 2017-08-29 NOTE — Assessment & Plan Note (Signed)
Discussed risk of benzo plus opioid use again, gave her copy of the Jul 07, 2015 press release; see patient regularly every 3 months; no concern for misuse or diversion at this time

## 2017-08-29 NOTE — Assessment & Plan Note (Signed)
Reviewed last UDS, April 2018 (six months ago); repeat today; necessary to confirm presence of prescribed substance and evaluate for unintended medicines or recreational drugs

## 2017-08-29 NOTE — Assessment & Plan Note (Signed)
Managed by ENT, Dr. Richardson Landry; recent US October 2018 reviewed

## 2017-08-29 NOTE — Assessment & Plan Note (Signed)
Seen today for chronic pain management

## 2017-08-29 NOTE — Assessment & Plan Note (Addendum)
Patient seen today for back pain

## 2017-08-29 NOTE — Assessment & Plan Note (Signed)
Refer to ortho; concern about rotator cuff vs impingement syndrome; okay for cortisone injection if ortho thinks would be helpful

## 2017-09-08 ENCOUNTER — Emergency Department: Payer: Medicare Other

## 2017-09-08 ENCOUNTER — Emergency Department
Admission: EM | Admit: 2017-09-08 | Discharge: 2017-09-08 | Disposition: A | Discharge status: Home / Self Care | Payer: Medicare Other | Attending: Emergency Medicine | Admitting: Emergency Medicine

## 2017-09-08 DIAGNOSIS — Y999 Unspecified external cause status: Secondary | ICD-10-CM | POA: Insufficient documentation

## 2017-09-08 DIAGNOSIS — S82121A Displaced fracture of lateral condyle of right tibia, initial encounter for closed fracture: Secondary | ICD-10-CM | POA: Diagnosis not present

## 2017-09-08 DIAGNOSIS — M25512 Pain in left shoulder: Secondary | ICD-10-CM | POA: Diagnosis not present

## 2017-09-08 DIAGNOSIS — W109XXA Fall (on) (from) unspecified stairs and steps, initial encounter: Secondary | ICD-10-CM | POA: Insufficient documentation

## 2017-09-08 DIAGNOSIS — Z87891 Personal history of nicotine dependence: Secondary | ICD-10-CM | POA: Insufficient documentation

## 2017-09-08 DIAGNOSIS — Y92009 Unspecified place in unspecified non-institutional (private) residence as the place of occurrence of the external cause: Secondary | ICD-10-CM

## 2017-09-08 DIAGNOSIS — S4992XA Unspecified injury of left shoulder and upper arm, initial encounter: Secondary | ICD-10-CM | POA: Diagnosis not present

## 2017-09-08 DIAGNOSIS — Z7982 Long term (current) use of aspirin: Secondary | ICD-10-CM | POA: Diagnosis not present

## 2017-09-08 DIAGNOSIS — M79631 Pain in right forearm: Secondary | ICD-10-CM | POA: Diagnosis not present

## 2017-09-08 DIAGNOSIS — S8992XA Unspecified injury of left lower leg, initial encounter: Secondary | ICD-10-CM | POA: Diagnosis not present

## 2017-09-08 DIAGNOSIS — S82142A Displaced bicondylar fracture of left tibia, initial encounter for closed fracture: Secondary | ICD-10-CM | POA: Insufficient documentation

## 2017-09-08 DIAGNOSIS — W19XXXA Unspecified fall, initial encounter: Secondary | ICD-10-CM

## 2017-09-08 DIAGNOSIS — S59911A Unspecified injury of right forearm, initial encounter: Secondary | ICD-10-CM | POA: Diagnosis not present

## 2017-09-08 DIAGNOSIS — M7989 Other specified soft tissue disorders: Secondary | ICD-10-CM | POA: Diagnosis not present

## 2017-09-08 DIAGNOSIS — S52131A Displaced fracture of neck of right radius, initial encounter for closed fracture: Secondary | ICD-10-CM | POA: Diagnosis not present

## 2017-09-08 DIAGNOSIS — I10 Essential (primary) hypertension: Secondary | ICD-10-CM | POA: Insufficient documentation

## 2017-09-08 DIAGNOSIS — M25521 Pain in right elbow: Secondary | ICD-10-CM | POA: Diagnosis not present

## 2017-09-08 DIAGNOSIS — Y92008 Other place in unspecified non-institutional (private) residence as the place of occurrence of the external cause: Secondary | ICD-10-CM | POA: Diagnosis not present

## 2017-09-08 DIAGNOSIS — Y9389 Activity, other specified: Secondary | ICD-10-CM | POA: Insufficient documentation

## 2017-09-08 DIAGNOSIS — Z79899 Other long term (current) drug therapy: Secondary | ICD-10-CM | POA: Diagnosis not present

## 2017-09-08 MED ORDER — HYDROCODONE-ACETAMINOPHEN 10-325 MG PO TABS: 1.0000 | ORAL_TABLET | Freq: Once | ORAL | Status: DC

## 2017-09-08 MED ORDER — HYDROCODONE-ACETAMINOPHEN 5-325 MG PO TABS
ORAL_TABLET | ORAL | Status: AC
  Administered 2017-09-08: 1 via ORAL
  Filled 2017-09-08: qty 1

## 2017-09-08 MED ORDER — HYDROCODONE-ACETAMINOPHEN 5-325 MG PO TABS
1.0000 | ORAL_TABLET | Freq: Once | ORAL | Status: AC
  Administered 2017-09-08: 1 via ORAL

## 2017-09-08 NOTE — ED Notes (Signed)
Pt reports that she missed a step and fell today - pt reports left leg and right arm are hurting - denies hitting head or loss of consciousness

## 2017-09-08 NOTE — ED Provider Notes (Signed)
[facility] Emergency Department Provider Note ____________________________________________  Time seen: 70  I have reviewed the triage vital signs and the nursing notes.  HISTORY  Chief Complaint  Fall  HPI Samantha Dawson is a 83 y.o. female presents to the ED, accompanied by family, for evaluation of injury sustained following a fall.  Patient describes that she tripped while stepping into the garage of her granddaughters home.  She did not see the steps and fell forward.  She admits that she did not hit her head nor did she lose consciousness.  She presents now with primary complaints of pain to the right forearm and elbow as well as to the left lower leg.  She denies any cuts, scrapes, or abrasions.  She does take Plavix daily as well as daily hydrocodone for arthritis pain.  Past Medical History:  Diagnosis Date  . Acid reflux 10/15/2016  . Anxiety   . Controlled substance agreement signed 03/22/2016  . 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 Encounter for chronic pain management   . Hx of smoking 04/17/2016  . Hyperlipidemia   . Hypertension   . Osteoarthritis   . Osteoporosis 03/22/2016  . Primary osteoarthritis of both hands 05/03/2016    Patient Active Problem List   Diagnosis Date Noted  . Shoulder pain, left 08/29/2017  . Thigh pain, musculoskeletal, left 11/29/2016  . Acid reflux 10/15/2016  . Hypoxemia 09/08/2016  . Uncomplicated opioid use 74/06/1447  . Hyperglycemia 09/02/2016  . Left thyroid nodule 07/13/2016  . Abnormal bone scan of cervical spine 06/18/2016  . Primary osteoarthritis of both hands 05/03/2016  . Elevated serum alkaline phosphatase level 05/03/2016  . Hyperkalemia 05/03/2016  . Medication monitoring encounter 05/03/2016  . 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 Hx of smoking 04/17/2016  . Controlled substance agreement signed 03/22/2016  . Osteoporosis 03/22/2016  . Back pain 03/22/2016  .  Hyperlipidemia 10/25/2015  . Essential hypertension 10/25/2015  . Panlobular emphysema ([place]) 10/25/2015  . Protein calorie malnutrition ([place]) 05/26/2015  . Chronic pain 05/25/2015  . Actinic keratosis 05/25/2015    Past Surgical History:  Procedure Laterality Date  . ABDOMINAL HYSTERECTOMY    . CATARACT EXTRACTION    . endaryerectomy    . renal stenting      Prior to Admission medications   Medication Sig Start Date End Date Taking? Authorizing Provider  albuterol (PROAIR HFA) 108 (90 Base) MCG/ACT inhaler Inhale 2 puffs into the lungs every 6 (six) hours as needed for wheezing or shortness of breath. 08/29/17   Arnetha Courser, MD  aspirin 81 MG tablet Take 81 mg by mouth daily.    [provider]  BEVESPI AEROSPHERE 9-4.8 MCG/ACT AERO USE 2 PUFFS TWICE A DAY 05/29/17   Lada, Satira Anis, MD  clopidogrel (PLAVIX) 75 MG tablet Take 1 tablet (75 mg total) by mouth daily. 06/25/17   Arnetha Courser, MD  co-enzyme Q-10 30 MG capsule Take 100 mg by mouth daily.     [provider]  fluticasone (FLONASE) 50 MCG/ACT nasal spray Place 2 sprays into both nostrils as needed.  08/17/17   [provider]  HYDROcodone-acetaminophen (NORCO) 10-325 MG tablet Take 1 tablet by mouth every 6 (six) hours as needed. #1 may fill now 08/29/17   Arnetha Courser, MD  HYDROcodone-acetaminophen (NORCO) 10-325 MG tablet Take 1 tablet by mouth every 6 (six) hours as needed. Rx #2 fill on or after Sep 28, 2017 08/29/17   Enid Derry  P, MD  HYDROcodone-acetaminophen (NORCO) 10-325 MG tablet Take 1 tablet by mouth every 6 (six) hours as needed. Rx #3 fill no or after Oct 28, 2017 08/29/17   Arnetha Courser, MD  losartan (COZAAR) 25 MG tablet Take 0.5 tablets (12.5 mg total) by mouth daily. 05/29/17   Arnetha Courser, MD  metoprolol succinate (TOPROL-XL) 50 MG 24 hr tablet TAKE ONE TABLET BY MOUTH DAILY WITH OR IMMEDIATELY FOLLOWING A MEAL. 05/25/17   Lada, Satira Anis, MD  pantoprazole (PROTONIX)  40 MG tablet Take 1 tablet (40 mg total) by mouth at bedtime. 02/27/17   Arnetha Courser, MD  ranitidine (ZANTAC) 150 MG tablet Take 1 tablet (150 mg total) by mouth daily. Patient not taking: Reported on 08/29/2017 02/27/17   Arnetha Courser, MD  rosuvastatin (CRESTOR) 5 MG tablet TAKE 1 TABLET by mouth every other night 07/25/17   Arnetha Courser, MD    Allergies Patient has no known allergies.  Family History  Problem Relation Age of Onset  . Cancer Mother   . Coronary artery disease Father   . Cerebrovascular Accident Father   . Cancer Sister        breast cancer  . Cancer Brother        stomach  . Heart disease Daughter        heart vavle bypass?  . Stroke Sister   . Cancer Sister        stomach  . Cancer Brother        unknown  . Lung disease Brother   . Arthritis Daughter     Social History Social History  Substance Use Topics  . Smoking status: Former Smoker    Packs/day: 0.20    Types: Cigarettes  . Smokeless tobacco: Never Used     Comment: Haven't smoke since she went to the [facility] 09-02-16  . Alcohol use No    Review of Systems  Constitutional: Negative for fever. Eyes: Negative for visual changes. ENT: Negative for sore throat. Cardiovascular: Negative for chest pain. Respiratory: Negative for shortness of breath. Gastrointestinal: Negative for abdominal pain, vomiting and diarrhea. Genitourinary: Negative for dysuria. Musculoskeletal: Negative for back pain.  Left shoulder pain, right elbow pain, and left knee pain. Skin: Negative for rash. Neurological: Negative for headaches, focal weakness or numbness. ____________________________________________  PHYSICAL EXAM:  VITAL SIGNS: ED Triage Vitals  Enc Vitals Group     BP 09/08/17 1701 (!) 151/86     Pulse Rate 09/08/17 1701 80     Resp 09/08/17 1701 16     Temp 09/08/17 1701 98.4 F (36.9 C)     Temp Source 09/08/17 1701 Oral     SpO2 09/08/17 1701 94 %     Weight 09/08/17 1701 123 lb (55.8  kg)     Height 09/08/17 1701 5\' 3"  (1.6 m)     Head Circumference --      Peak Flow --      Pain Score 09/08/17 1749 10     Pain Loc --      Pain Edu? --      Excl. in [place]? --     Constitutional: Alert and oriented. Well appearing and in no distress. Head: Normocephalic and atraumatic. Eyes: Conjunctivae are normal. Normal extraocular movements Neck: Supple. No thyromegaly. Normal ROM without crepitus Cardiovascular: Normal rate, regular rhythm. Normal distal pulses. Respiratory: Normal respiratory effort. No wheezes/rales/rhonchi. Musculoskeletal: Right upper extremity without any obvious deformity, dislocation, or ecchymosis.  Patient is  tender to palpation along the proximal forearm and the elbow.  She is able to demonstrate normal elbow flexion and extension range, as well as supination.  Her grip strength is symmetric bilaterally.  The left lower extremity does reveal some mild subtle soft tissue swelling to the lateral proximal shin.  Patient is tender to palpation at the posterior proximal calf and lateral shin.  Ankle exam is benign.  Nontender with normal range of motion in all extremities.  Neurologic:  Normal gait without ataxia. Normal speech and language. No gross focal neurologic deficits are appreciated. Skin:  Skin is warm, dry and intact. No rash noted. ____________________________________________   RADIOLOGY  Left Shoulder IMPRESSION: No acute osseous abnormality.  Left Knee  IMPRESSION: 1. Comminuted depressed fracture of the lateral tibial plateau. No dislocation.  Right Elbow IMPRESSION: Fracture of the radial neck as described. No dislocation  Right Forearm IMPRESSION: 1. Fracture of the radial neck described in greater detail under the right elbow radiographs. 2. No other fractures. No dislocation.  I, Preston Weill, Dannielle Karvonen, personally viewed and evaluated these images (plain radiographs) as part of my medical decision making, as well as reviewing  the written report by the radiologist. ____________________________________________  PROCEDURES  Norco 5-325 mg PO Posterior short arm splint Arm sling Jones-watson compression wrap Knee immobilizer ____________________________________________  INITIAL IMPRESSION / ASSESSMENT AND PLAN / ED COURSE  ----------------------------------------- 7:44 PM on 09/08/2017 -----------------------------------------  Spoke to Dr. Donivan Scull (ortho) who reviewed the images. He suggested splinting and ortho follow-up next week.   Geriatric patient with ED evaluation and initial fracture management of injury sustained following a mechanical fall.  Patient sustained a closed nondisplaced radial neck fracture on the right elbow and a closed left tibial plateau fracture.  He is splinted as appropriate and given discharge instructions to see Dr. Truitt Leep next week for further fracture care.  She will does her home pain medicines as previously directed.  Fracture care instructions are provided. ____________________________________________  FINAL CLINICAL IMPRESSION(S) / ED DIAGNOSES  Final diagnoses:  Fall in home, initial encounter  Closed displaced fracture of neck of right radius, initial encounter  Tibial plateau fracture, left, closed, initial encounter      Melvenia Needles, PA-C 09/08/17 2328    Carrie Mew, MD 09/09/17 [phone]

## 2017-09-08 NOTE — Discharge Instructions (Signed)
You have sustained fractures to your elbow and knee, following your fall. Wear the knee immobilizer when walking, and keep the elbow splint in place until you see Dr. Rudene Christians next week. Take you home pain medicines as needed. Rest with the leg elevated and apply ice to the knee and elbow to reduce swelling.

## 2017-09-08 NOTE — ED Notes (Signed)

## 2017-09-08 NOTE — ED Triage Notes (Signed)
Pt had mechanical fall today, did not see stairs and tripped. Did not hit head. No LOC. C/o right forearm pain and left leg pain below the knee. On plavix.

## 2017-09-10 ENCOUNTER — Other Ambulatory Visit: Payer: Self-pay

## 2017-09-10 NOTE — Patient Outreach (Signed)
Outreach patient after ED visit on 09/08/17 at [facility].  I called but there was no answer and no voicemail.  Unusuccessful letter, magnet and know before you go mailed on 09/10/17.

## 2017-09-12 DIAGNOSIS — S82142A Displaced bicondylar fracture of left tibia, initial encounter for closed fracture: Secondary | ICD-10-CM | POA: Diagnosis not present

## 2017-09-12 DIAGNOSIS — S52134A Nondisplaced fracture of neck of right radius, initial encounter for closed fracture: Secondary | ICD-10-CM | POA: Diagnosis not present

## 2017-09-12 DIAGNOSIS — M25562 Pain in left knee: Secondary | ICD-10-CM | POA: Diagnosis not present

## 2017-09-12 DIAGNOSIS — M25521 Pain in right elbow: Secondary | ICD-10-CM | POA: Diagnosis not present

## 2017-09-13 DIAGNOSIS — S82142A Displaced bicondylar fracture of left tibia, initial encounter for closed fracture: Secondary | ICD-10-CM

## 2017-09-13 DIAGNOSIS — S52134A Nondisplaced fracture of neck of right radius, initial encounter for closed fracture: Secondary | ICD-10-CM | POA: Insufficient documentation

## 2017-09-13 HISTORY — DX: Nondisplaced fracture of neck of right radius, initial encounter for closed fracture: S52.134A

## 2017-09-13 HISTORY — DX: Displaced bicondylar fracture of left tibia, initial encounter for closed fracture: S82.142A

## 2017-09-14 ENCOUNTER — Encounter: Payer: Self-pay | Admitting: Family Medicine

## 2017-10-10 DIAGNOSIS — S82142D Displaced bicondylar fracture of left tibia, subsequent encounter for closed fracture with routine healing: Secondary | ICD-10-CM | POA: Diagnosis not present

## 2017-10-12 ENCOUNTER — Telehealth: Payer: Self-pay

## 2017-10-12 ENCOUNTER — Ambulatory Visit: Payer: Self-pay | Admitting: *Deleted

## 2017-10-12 NOTE — Telephone Encounter (Signed)
Pt.notified

## 2017-10-12 NOTE — Telephone Encounter (Signed)
Patient called, had a fall back in november broke her arm and leg.  She has now developed a cough. No other symptoms.  She is coughing so bad she is having some SOB and it hurts. Please advise?

## 2017-10-12 NOTE — Telephone Encounter (Signed)
She needs an appt, either here or urgent care

## 2017-10-12 NOTE — Telephone Encounter (Signed)
Pt   Had leg  Fracture   approx  1  Month  Ago  She  Reports  Having   Cough   And  Pain in sides   When  She  Coughs    Symptoms  X  8  Days.   Pt uses  An inhaler  At times   When  She  Gets  Short of breath .   Pt is  Requesting  An  Anti  Biotic  . Advised  Pt   She needed  To  Be   Seen  For  The   Symptoms   Offered  To make  Her  An  appt . She  Declined    And    Wanted  To  Speak  To  The  Doctor .Pt   Connected  Via  Conference call    To  [facility]  Staff  Talked   With   Pt .    Reason for Disposition . [1] Known COPD or other severe lung disease (i.e., bronchiectasis, cystic fibrosis, lung surgery) AND [2] worsening symptoms (i.e., increased sputum purulence or amount, increased breathing difficulty  Answer Assessment - Initial Assessment Questions 1. ONSET: "When did the cough begin?"       8   Days  Ago   2. SEVERITY: "How bad is the cough today?"        Has   Spells    At  Frequent  Intervals   3. RESPIRATORY DISTRESS: "Describe your breathing."        Ok     Uses  An inhaler   4. FEVER: "Do you have a fever?" If so, ask: "What is your temperature, how was it measured, and when did it start?"     No 5. SPUTUM: "Describe the color of your sputum" (clear, white, yellow, green)       Yellow   Thick   6. HEMOPTYSIS: "Are you coughing up any blood?" If so ask: "How much?" (flecks, streaks, tablespoons, etc.)     No 7. CARDIAC HISTORY: "Do you have any history of heart disease?" (e.g., heart attack, congestive heart failure)      no 8. LUNG HISTORY: "Do you have any history of lung disease?"  (e.g., pulmonary embolus, asthma, emphysema)      No 9. PE RISK FACTORS: "Do you have a history of blood clots?" (or: recent major surgery, recent prolonged travel, bedridden )     NO 10. OTHER SYMPTOMS: "Do you have any other symptoms?" (e.g., runny nose, wheezing, chest pain)      Runny  Nose  And  Some  Wheezing   11. PREGNANCY: "Is there any chance you are  pregnant?" "When was your last menstrual period?"       no 12. TRAVEL: "Have you traveled out of the country in the last month?" (e.g., travel history, exposures)       no  Protocols used: [place]

## 2017-10-12 NOTE — Telephone Encounter (Signed)
She will need to get to an urgent care or the ER; she needs evaluation; I will not just call in an antibiotic sight unseen; she needs to see a provider; thank you

## 2017-10-12 NOTE — Telephone Encounter (Signed)
Called patient back states, that's why she called you she has broke arm and leg and cannot drive and has no one to bring here.  Everyone is at work and cannot take off.  please advise or call patient, patient is frustrated

## 2017-11-09 DIAGNOSIS — S82142D Displaced bicondylar fracture of left tibia, subsequent encounter for closed fracture with routine healing: Secondary | ICD-10-CM | POA: Diagnosis not present

## 2017-11-09 DIAGNOSIS — M25512 Pain in left shoulder: Secondary | ICD-10-CM | POA: Diagnosis not present

## 2017-11-09 DIAGNOSIS — M25561 Pain in right knee: Secondary | ICD-10-CM | POA: Diagnosis not present

## 2017-11-15 DIAGNOSIS — Z7982 Long term (current) use of aspirin: Secondary | ICD-10-CM | POA: Diagnosis not present

## 2017-11-15 DIAGNOSIS — I10 Essential (primary) hypertension: Secondary | ICD-10-CM | POA: Diagnosis not present

## 2017-11-15 DIAGNOSIS — Z7902 Long term (current) use of antithrombotics/antiplatelets: Secondary | ICD-10-CM | POA: Diagnosis not present

## 2017-11-15 DIAGNOSIS — S82142D Displaced bicondylar fracture of left tibia, subsequent encounter for closed fracture with routine healing: Secondary | ICD-10-CM | POA: Diagnosis not present

## 2017-11-15 DIAGNOSIS — Z9181 History of falling: Secondary | ICD-10-CM | POA: Diagnosis not present

## 2017-11-15 DIAGNOSIS — K219 Gastro-esophageal reflux disease without esophagitis: Secondary | ICD-10-CM | POA: Diagnosis not present

## 2017-11-15 DIAGNOSIS — M1991 Primary osteoarthritis, unspecified site: Secondary | ICD-10-CM | POA: Diagnosis not present

## 2017-11-20 DIAGNOSIS — K219 Gastro-esophageal reflux disease without esophagitis: Secondary | ICD-10-CM | POA: Diagnosis not present

## 2017-11-20 DIAGNOSIS — Z7902 Long term (current) use of antithrombotics/antiplatelets: Secondary | ICD-10-CM | POA: Diagnosis not present

## 2017-11-20 DIAGNOSIS — S82142D Displaced bicondylar fracture of left tibia, subsequent encounter for closed fracture with routine healing: Secondary | ICD-10-CM | POA: Diagnosis not present

## 2017-11-20 DIAGNOSIS — M1991 Primary osteoarthritis, unspecified site: Secondary | ICD-10-CM | POA: Diagnosis not present

## 2017-11-20 DIAGNOSIS — Z7982 Long term (current) use of aspirin: Secondary | ICD-10-CM | POA: Diagnosis not present

## 2017-11-20 DIAGNOSIS — Z9181 History of falling: Secondary | ICD-10-CM | POA: Diagnosis not present

## 2017-11-20 DIAGNOSIS — I10 Essential (primary) hypertension: Secondary | ICD-10-CM | POA: Diagnosis not present

## 2017-11-22 DIAGNOSIS — Z9181 History of falling: Secondary | ICD-10-CM | POA: Diagnosis not present

## 2017-11-22 DIAGNOSIS — Z7902 Long term (current) use of antithrombotics/antiplatelets: Secondary | ICD-10-CM | POA: Diagnosis not present

## 2017-11-22 DIAGNOSIS — M1991 Primary osteoarthritis, unspecified site: Secondary | ICD-10-CM | POA: Diagnosis not present

## 2017-11-22 DIAGNOSIS — K219 Gastro-esophageal reflux disease without esophagitis: Secondary | ICD-10-CM | POA: Diagnosis not present

## 2017-11-22 DIAGNOSIS — S82142D Displaced bicondylar fracture of left tibia, subsequent encounter for closed fracture with routine healing: Secondary | ICD-10-CM | POA: Diagnosis not present

## 2017-11-22 DIAGNOSIS — Z7982 Long term (current) use of aspirin: Secondary | ICD-10-CM | POA: Diagnosis not present

## 2017-11-22 DIAGNOSIS — I10 Essential (primary) hypertension: Secondary | ICD-10-CM | POA: Diagnosis not present

## 2017-11-27 DIAGNOSIS — M1991 Primary osteoarthritis, unspecified site: Secondary | ICD-10-CM | POA: Diagnosis not present

## 2017-11-27 DIAGNOSIS — S82142D Displaced bicondylar fracture of left tibia, subsequent encounter for closed fracture with routine healing: Secondary | ICD-10-CM | POA: Diagnosis not present

## 2017-11-27 DIAGNOSIS — Z7982 Long term (current) use of aspirin: Secondary | ICD-10-CM | POA: Diagnosis not present

## 2017-11-27 DIAGNOSIS — Z9181 History of falling: Secondary | ICD-10-CM | POA: Diagnosis not present

## 2017-11-27 DIAGNOSIS — K219 Gastro-esophageal reflux disease without esophagitis: Secondary | ICD-10-CM | POA: Diagnosis not present

## 2017-11-27 DIAGNOSIS — Z7902 Long term (current) use of antithrombotics/antiplatelets: Secondary | ICD-10-CM | POA: Diagnosis not present

## 2017-11-27 DIAGNOSIS — I10 Essential (primary) hypertension: Secondary | ICD-10-CM | POA: Diagnosis not present

## 2017-11-29 ENCOUNTER — Ambulatory Visit (INDEPENDENT_AMBULATORY_CARE_PROVIDER_SITE_OTHER): Payer: Medicare Other | Admitting: Family Medicine

## 2017-11-29 ENCOUNTER — Encounter: Payer: Self-pay | Admitting: Family Medicine

## 2017-11-29 DIAGNOSIS — S52134D Nondisplaced fracture of neck of right radius, subsequent encounter for closed fracture with routine healing: Secondary | ICD-10-CM | POA: Diagnosis not present

## 2017-11-29 DIAGNOSIS — G8929 Other chronic pain: Secondary | ICD-10-CM

## 2017-11-29 DIAGNOSIS — S82142D Displaced bicondylar fracture of left tibia, subsequent encounter for closed fracture with routine healing: Secondary | ICD-10-CM | POA: Diagnosis not present

## 2017-11-29 DIAGNOSIS — M5134 Other intervertebral disc degeneration, thoracic region: Secondary | ICD-10-CM | POA: Diagnosis not present

## 2017-11-29 DIAGNOSIS — M546 Pain in thoracic spine: Secondary | ICD-10-CM

## 2017-11-29 DIAGNOSIS — Z79899 Other long term (current) drug therapy: Secondary | ICD-10-CM

## 2017-11-29 MED ORDER — HYDROCODONE-ACETAMINOPHEN 10-325 MG PO TABS: 1.0000 | ORAL_TABLET | Freq: Four times a day (QID) | ORAL | 0 refills | Status: DC | PRN

## 2017-11-29 NOTE — Assessment & Plan Note (Signed)
Chronic; pain medicine helps quality of life; continue, return in 3 months

## 2017-11-29 NOTE — Progress Notes (Signed)
BP (!) 146/78 (Patient Position: Sitting)   Pulse 83   Temp 98.2 F (36.8 C) (Oral)   Resp 14   Wt 111 lb (50.3 kg)   SpO2 93%   BMI 19.66 kg/m    Subjective:    Patient ID: Samantha Dawson, female    DOB: May 12, 1929, 84 y.o.   MRN: [id number]  HPI: Samantha Dawson is a 84 y.o. female  Chief Complaint  Patient presents with  . Follow-up    HPI She broke her left leg and right arm in November; saw Dr. Rudene Christians for these fractures She was going in the side door through the garage; there was a step there but she forgot about it, someone else's house, and missed the step and fell She was responsible and told the doctor at [facility] that she already had pain medicine from me Did not have to have surgery Right elbow never really bothered her  LEFT knee xray 09/08/2017: IMPRESSION: 1. Comminuted depressed fracture of the lateral tibial plateau. No dislocation.   Electronically Signed   By: Lajean Manes M.D.   On: 09/08/2017 19:09  LEFT shoulder xray 09/08/2017: CLINICAL DATA:  Patient status post fall. Shoulder pain. Initial encounter.  EXAM: LEFT SHOULDER - 2+ VIEW  COMPARISON:  None.  FINDINGS: Normal anatomic alignment. No evidence for acute fracture or dislocation. Visualize left hemithorax is unremarkable.  IMPRESSION: No acute osseous abnormality.   Electronically Signed   By: Lovey Newcomer M.D.   On: 09/08/2017 19:08  RIGHT elbow xray 09/08/2017: IMPRESSION: Fracture of the radial neck as described.  No dislocation.   Electronically Signed   By: Lajean Manes M.D.   On: 09/08/2017 19:11   She has chronic arthritis and take narcotic pain medicine Medicine does not make her feel drunk or loopy or goofy Does get constipation, has to be careful and takes stool softeneres Most days, her pain is controlled with medicine and improves quality of life Wishes to continue  She reports difficulty falling asleep and stay asleep; going on for few  months; she has tried melatonin  Depression screen [facility] 2/9 11/29/2017 08/29/2017 05/29/2017 02/27/2017 11/29/2016  Decreased Interest 0 0 0 0 0  Down, Depressed, Hopeless 0 0 0 0 0  PHQ - 2 Score 0 0 0 0 0    Relevant past medical, surgical, family and social history reviewed Past Medical History:  Diagnosis Date  . Acid reflux 10/15/2016  . Anxiety   . Controlled substance agreement signed 03/22/2016  . 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 Encounter for chronic pain management   . Hx of smoking 04/17/2016  . Hyperlipidemia   . Hypertension   . Osteoarthritis   . Osteoporosis 03/22/2016  . Primary osteoarthritis of both hands 05/03/2016   Past Surgical History:  Procedure Laterality Date  . ABDOMINAL HYSTERECTOMY    . CATARACT EXTRACTION    . endaryerectomy    . renal stenting     Family History  Problem Relation Age of Onset  . Cancer Mother   . Coronary artery disease Father   . Cerebrovascular Accident Father   . Cancer Sister        breast cancer  . Cancer Brother        stomach  . Heart disease Daughter        heart vavle bypass?  . Stroke Sister   . Cancer Sister        stomach  . Cancer Brother  unknown  . Lung disease Brother   . Arthritis Daughter    Social History   Tobacco Use  . Smoking status: Former Smoker    Packs/day: 0.20    Types: Cigarettes  . Smokeless tobacco: Never Used  . Tobacco comment: Haven't smoke since she went to the HOSP 09-02-16  Substance Use Topics  . Alcohol use: No    Alcohol/week: 0.0 oz  . Drug use: No    Interim medical history since last visit reviewed. Allergies and medications reviewed  Review of Systems Per HPI unless specifically indicated above     Objective:    BP (!) 146/78 (Patient Position: Sitting)   Pulse 83   Temp 98.2 F (36.8 C) (Oral)   Resp 14   Wt 111 lb (50.3 kg)   SpO2 93%   BMI 19.66 kg/m   Wt Readings from Last 3 Encounters:  11/29/17 111 lb (50.3 kg)  09/08/17 123  lb (55.8 kg)  08/29/17 123 lb 11.2 oz (56.1 kg)    Physical Exam  Constitutional: She appears well-developed and well-nourished. No distress.  Cardiovascular: Normal rate and regular rhythm.  Pulmonary/Chest: Effort normal and breath sounds normal.  Neurological: She is alert.  Gait not assessed; walker in the room  Psychiatric: She has a normal mood and affect. Her behavior is normal.       Assessment & Plan:   Problem List Items Addressed This Visit      Musculoskeletal and Integument   Degenerative disc disease, thoracic    Continue medicine, no red flags      Relevant Medications   HYDROcodone-acetaminophen (NORCO) 10-325 MG tablet   HYDROcodone-acetaminophen (NORCO) 10-325 MG tablet   HYDROcodone-acetaminophen (NORCO) 10-325 MG tablet   Closed nondisplaced fracture of neck of right radius    Asymptomatic, doing well; dominant hand      Closed fracture of left tibial plateau    Healing without incident; bearing weight again; pain control through this office        Other   Controlled substance agreement signed    I am so proud of her for not accepting pain medicine from the ortho or ER for her fractures and staying true to her controlled substance agreement; reviewed [place] web site; no red flags, no other prescribers      Back pain    Chronic; pain medicine helps quality of life; continue, return in 3 months      Relevant Medications   HYDROcodone-acetaminophen (NORCO) 10-325 MG tablet   HYDROcodone-acetaminophen (NORCO) 10-325 MG tablet   HYDROcodone-acetaminophen (NORCO) 10-325 MG tablet       Follow up plan: Return for Medicare Wellness check, 40 minute visit.  An after-visit summary was printed and given to the patient at [place].  Please see the patient instructions which may contain other information and recommendations beyond what is mentioned above in the assessment and plan.  Meds ordered this encounter  Medications  . HYDROcodone-acetaminophen  (NORCO) 10-325 MG tablet    Sig: Take 1 tablet by mouth every 6 (six) hours as needed. #1 may fill now    Dispense:  105 tablet    Refill:  0  . HYDROcodone-acetaminophen (NORCO) 10-325 MG tablet    Sig: Take 1 tablet by mouth every 6 (six) hours as needed.    Dispense:  105 tablet    Refill:  0    Rx #2 fill on or after Dec 29, 2017  . HYDROcodone-acetaminophen (NORCO) 10-325 MG tablet  Sig: Take 1 tablet by mouth every 6 (six) hours as needed.    Dispense:  105 tablet    Refill:  0    Rx #3 fill no or after January 27, 2018    No orders of the defined types were placed in this encounter.

## 2017-11-29 NOTE — Patient Instructions (Addendum)
Try melatonin 3 mg at the exact same of night for 3 weeks No caffeine or chocolate or tea after 1 pm No electronics in the bedroom No electronics for two hours before sleep Insomnia Insomnia is a sleep disorder that makes it difficult to fall asleep or to stay asleep. Insomnia can cause tiredness (fatigue), low energy, difficulty concentrating, mood swings, and poor performance at work or school. There are three different ways to classify insomnia:  Difficulty falling asleep.  Difficulty staying asleep.  Waking up too early in the morning.  Any type of insomnia can be long-term (chronic) or short-term (acute). Both are common. Short-term insomnia usually lasts for three months or less. Chronic insomnia occurs at least three times a week for longer than three months. What are the causes? Insomnia may be caused by another condition, situation, or substance, such as:  Anxiety.  Certain medicines.  Gastroesophageal reflux disease (GERD) or other gastrointestinal conditions.  Asthma or other breathing conditions.  Restless legs syndrome, sleep apnea, or other sleep disorders.  Chronic pain.  Menopause. This may include hot flashes.  Stroke.  Abuse of alcohol, tobacco, or illegal drugs.  Depression.  Caffeine.  Neurological disorders, such as Alzheimer disease.  An overactive thyroid (hyperthyroidism).  The cause of insomnia may not be known. What increases the risk? Risk factors for insomnia include:  Gender. Women are more commonly affected than men.  Age. Insomnia is more common as you get older.  Stress. This may involve your professional or personal life.  Income. Insomnia is more common in people with lower income.  Lack of exercise.  Irregular work schedule or night shifts.  Traveling between different time zones.  What are the signs or symptoms? If you have insomnia, trouble falling asleep or trouble staying asleep is the main symptom. This may lead to  other symptoms, such as:  Feeling fatigued.  Feeling nervous about going to sleep.  Not feeling rested in the morning.  Having trouble concentrating.  Feeling irritable, anxious, or depressed.  How is this treated? Treatment for insomnia depends on the cause. If your insomnia is caused by an underlying condition, treatment will focus on addressing the condition. Treatment may also include:  Medicines to help you sleep.  Counseling or therapy.  Lifestyle adjustments.  Follow these instructions at home:  Take medicines only as directed by your health care provider.  Keep regular sleeping and waking hours. Avoid naps.  Keep a sleep diary to help you and your health care provider figure out what could be causing your insomnia. Include: ? When you sleep. ? When you wake up during the night. ? How well you sleep. ? How rested you feel the next day. ? Any side effects of medicines you are taking. ? What you eat and drink.  Make your bedroom a comfortable place where it is easy to fall asleep: ? Put up shades or special blackout curtains to block light from outside. ? Use a white noise machine to block noise. ? Keep the temperature cool.  Exercise regularly as directed by your health care provider. Avoid exercising right before bedtime.  Use relaxation techniques to manage stress. Ask your health care provider to suggest some techniques that may work well for you. These may include: ? Breathing exercises. ? Routines to release muscle tension. ? Visualizing peaceful scenes.  Cut back on alcohol, caffeinated beverages, and cigarettes, especially close to bedtime. These can disrupt your sleep.  Do not overeat or eat spicy foods right  before bedtime. This can lead to digestive discomfort that can make it hard for you to sleep.  Limit screen use before bedtime. This includes: ? Watching TV. ? Using your smartphone, tablet, and computer.  Stick to a routine. This can help you  fall asleep faster. Try to do a quiet activity, brush your teeth, and go to bed at the same time each night.  Get out of bed if you are still awake after 15 minutes of trying to sleep. Keep the lights down, but try reading or doing a quiet activity. When you feel sleepy, go back to bed.  Make sure that you drive carefully. Avoid driving if you feel very sleepy.  Keep all follow-up appointments as directed by your health care provider. This is important. Contact a health care provider if:  You are tired throughout the day or have trouble in your daily routine due to sleepiness.  You continue to have sleep problems or your sleep problems get worse. Get help right away if:  You have serious thoughts about hurting yourself or someone else. This information is not intended to replace advice given to you by your health care provider. Make sure you discuss any questions you have with your health care provider. Document Released: 10/20/2000 Document Revised: 03/24/2016 Document Reviewed: 07/24/2014 Elsevier Interactive Patient Education  [employer].

## 2017-11-29 NOTE — Assessment & Plan Note (Addendum)
I am so proud of her for not accepting pain medicine from the ortho or ER for her fractures and staying true to her controlled substance agreement; reviewed [place] web site; no red flags, no other prescribers

## 2017-11-29 NOTE — Assessment & Plan Note (Signed)
Continue medicine, no red flags

## 2017-11-29 NOTE — Assessment & Plan Note (Signed)
Asymptomatic, doing well; dominant hand

## 2017-11-29 NOTE — Assessment & Plan Note (Signed)
Healing without incident; bearing weight again; pain control through this office

## 2017-11-30 DIAGNOSIS — Z7982 Long term (current) use of aspirin: Secondary | ICD-10-CM | POA: Diagnosis not present

## 2017-11-30 DIAGNOSIS — K219 Gastro-esophageal reflux disease without esophagitis: Secondary | ICD-10-CM | POA: Diagnosis not present

## 2017-11-30 DIAGNOSIS — Z7902 Long term (current) use of antithrombotics/antiplatelets: Secondary | ICD-10-CM | POA: Diagnosis not present

## 2017-11-30 DIAGNOSIS — M1991 Primary osteoarthritis, unspecified site: Secondary | ICD-10-CM | POA: Diagnosis not present

## 2017-11-30 DIAGNOSIS — S82142D Displaced bicondylar fracture of left tibia, subsequent encounter for closed fracture with routine healing: Secondary | ICD-10-CM | POA: Diagnosis not present

## 2017-11-30 DIAGNOSIS — I10 Essential (primary) hypertension: Secondary | ICD-10-CM | POA: Diagnosis not present

## 2017-11-30 DIAGNOSIS — Z9181 History of falling: Secondary | ICD-10-CM | POA: Diagnosis not present

## 2017-12-04 DIAGNOSIS — S82142D Displaced bicondylar fracture of left tibia, subsequent encounter for closed fracture with routine healing: Secondary | ICD-10-CM | POA: Diagnosis not present

## 2017-12-04 DIAGNOSIS — M1991 Primary osteoarthritis, unspecified site: Secondary | ICD-10-CM | POA: Diagnosis not present

## 2017-12-04 DIAGNOSIS — Z7902 Long term (current) use of antithrombotics/antiplatelets: Secondary | ICD-10-CM | POA: Diagnosis not present

## 2017-12-04 DIAGNOSIS — Z7982 Long term (current) use of aspirin: Secondary | ICD-10-CM | POA: Diagnosis not present

## 2017-12-04 DIAGNOSIS — K219 Gastro-esophageal reflux disease without esophagitis: Secondary | ICD-10-CM | POA: Diagnosis not present

## 2017-12-04 DIAGNOSIS — Z9181 History of falling: Secondary | ICD-10-CM | POA: Diagnosis not present

## 2017-12-04 DIAGNOSIS — I10 Essential (primary) hypertension: Secondary | ICD-10-CM | POA: Diagnosis not present

## 2017-12-06 DIAGNOSIS — Z7902 Long term (current) use of antithrombotics/antiplatelets: Secondary | ICD-10-CM | POA: Diagnosis not present

## 2017-12-06 DIAGNOSIS — I10 Essential (primary) hypertension: Secondary | ICD-10-CM | POA: Diagnosis not present

## 2017-12-06 DIAGNOSIS — Z7982 Long term (current) use of aspirin: Secondary | ICD-10-CM | POA: Diagnosis not present

## 2017-12-06 DIAGNOSIS — S82142D Displaced bicondylar fracture of left tibia, subsequent encounter for closed fracture with routine healing: Secondary | ICD-10-CM | POA: Diagnosis not present

## 2017-12-06 DIAGNOSIS — M1991 Primary osteoarthritis, unspecified site: Secondary | ICD-10-CM | POA: Diagnosis not present

## 2017-12-06 DIAGNOSIS — K219 Gastro-esophageal reflux disease without esophagitis: Secondary | ICD-10-CM | POA: Diagnosis not present

## 2017-12-06 DIAGNOSIS — Z9181 History of falling: Secondary | ICD-10-CM | POA: Diagnosis not present

## 2017-12-10 DIAGNOSIS — S82142D Displaced bicondylar fracture of left tibia, subsequent encounter for closed fracture with routine healing: Secondary | ICD-10-CM | POA: Diagnosis not present

## 2017-12-11 DIAGNOSIS — Z7902 Long term (current) use of antithrombotics/antiplatelets: Secondary | ICD-10-CM | POA: Diagnosis not present

## 2017-12-11 DIAGNOSIS — Z9181 History of falling: Secondary | ICD-10-CM | POA: Diagnosis not present

## 2017-12-11 DIAGNOSIS — M1991 Primary osteoarthritis, unspecified site: Secondary | ICD-10-CM | POA: Diagnosis not present

## 2017-12-11 DIAGNOSIS — K219 Gastro-esophageal reflux disease without esophagitis: Secondary | ICD-10-CM | POA: Diagnosis not present

## 2017-12-11 DIAGNOSIS — S82142D Displaced bicondylar fracture of left tibia, subsequent encounter for closed fracture with routine healing: Secondary | ICD-10-CM | POA: Diagnosis not present

## 2017-12-11 DIAGNOSIS — I10 Essential (primary) hypertension: Secondary | ICD-10-CM | POA: Diagnosis not present

## 2017-12-11 DIAGNOSIS — Z7982 Long term (current) use of aspirin: Secondary | ICD-10-CM | POA: Diagnosis not present

## 2017-12-13 DIAGNOSIS — M1991 Primary osteoarthritis, unspecified site: Secondary | ICD-10-CM | POA: Diagnosis not present

## 2017-12-13 DIAGNOSIS — Z7982 Long term (current) use of aspirin: Secondary | ICD-10-CM | POA: Diagnosis not present

## 2017-12-13 DIAGNOSIS — S82142D Displaced bicondylar fracture of left tibia, subsequent encounter for closed fracture with routine healing: Secondary | ICD-10-CM | POA: Diagnosis not present

## 2017-12-13 DIAGNOSIS — Z9181 History of falling: Secondary | ICD-10-CM | POA: Diagnosis not present

## 2017-12-13 DIAGNOSIS — I10 Essential (primary) hypertension: Secondary | ICD-10-CM | POA: Diagnosis not present

## 2017-12-13 DIAGNOSIS — K219 Gastro-esophageal reflux disease without esophagitis: Secondary | ICD-10-CM | POA: Diagnosis not present

## 2017-12-13 DIAGNOSIS — Z7902 Long term (current) use of antithrombotics/antiplatelets: Secondary | ICD-10-CM | POA: Diagnosis not present

## 2017-12-17 DIAGNOSIS — M1991 Primary osteoarthritis, unspecified site: Secondary | ICD-10-CM | POA: Diagnosis not present

## 2017-12-17 DIAGNOSIS — S82142D Displaced bicondylar fracture of left tibia, subsequent encounter for closed fracture with routine healing: Secondary | ICD-10-CM | POA: Diagnosis not present

## 2017-12-17 DIAGNOSIS — Z7982 Long term (current) use of aspirin: Secondary | ICD-10-CM | POA: Diagnosis not present

## 2017-12-17 DIAGNOSIS — Z7902 Long term (current) use of antithrombotics/antiplatelets: Secondary | ICD-10-CM | POA: Diagnosis not present

## 2017-12-17 DIAGNOSIS — I10 Essential (primary) hypertension: Secondary | ICD-10-CM | POA: Diagnosis not present

## 2017-12-17 DIAGNOSIS — Z9181 History of falling: Secondary | ICD-10-CM | POA: Diagnosis not present

## 2017-12-17 DIAGNOSIS — K219 Gastro-esophageal reflux disease without esophagitis: Secondary | ICD-10-CM | POA: Diagnosis not present

## 2017-12-20 DIAGNOSIS — S82142D Displaced bicondylar fracture of left tibia, subsequent encounter for closed fracture with routine healing: Secondary | ICD-10-CM | POA: Diagnosis not present

## 2017-12-20 DIAGNOSIS — Z7902 Long term (current) use of antithrombotics/antiplatelets: Secondary | ICD-10-CM | POA: Diagnosis not present

## 2017-12-20 DIAGNOSIS — K219 Gastro-esophageal reflux disease without esophagitis: Secondary | ICD-10-CM | POA: Diagnosis not present

## 2017-12-20 DIAGNOSIS — Z9181 History of falling: Secondary | ICD-10-CM | POA: Diagnosis not present

## 2017-12-20 DIAGNOSIS — Z7982 Long term (current) use of aspirin: Secondary | ICD-10-CM | POA: Diagnosis not present

## 2017-12-20 DIAGNOSIS — M1991 Primary osteoarthritis, unspecified site: Secondary | ICD-10-CM | POA: Diagnosis not present

## 2017-12-20 DIAGNOSIS — I10 Essential (primary) hypertension: Secondary | ICD-10-CM | POA: Diagnosis not present

## 2017-12-23 NOTE — Progress Notes (Signed)
Closing out scanned document note open since  May 2018

## 2017-12-23 NOTE — Progress Notes (Signed)
Closing out scanned document note open since  Nov 2018

## 2018-01-26 ENCOUNTER — Other Ambulatory Visit: Payer: Self-pay | Admitting: Family Medicine

## 2018-01-28 DIAGNOSIS — L570 Actinic keratosis: Secondary | ICD-10-CM | POA: Diagnosis not present

## 2018-01-28 DIAGNOSIS — Z85828 Personal history of other malignant neoplasm of skin: Secondary | ICD-10-CM | POA: Diagnosis not present

## 2018-01-28 DIAGNOSIS — L820 Inflamed seborrheic keratosis: Secondary | ICD-10-CM | POA: Diagnosis not present

## 2018-02-27 ENCOUNTER — Encounter: Payer: Self-pay | Admitting: Family Medicine

## 2018-02-27 ENCOUNTER — Ambulatory Visit (INDEPENDENT_AMBULATORY_CARE_PROVIDER_SITE_OTHER): Payer: Medicare Other | Admitting: Family Medicine

## 2018-02-27 ENCOUNTER — Other Ambulatory Visit: Payer: Self-pay

## 2018-02-27 VITALS — BP 132/82 | HR 76 | Temp 97.8°F | Ht 64.0 in | Wt 110.3 lb

## 2018-02-27 DIAGNOSIS — Z5181 Encounter for therapeutic drug level monitoring: Secondary | ICD-10-CM | POA: Diagnosis not present

## 2018-02-27 DIAGNOSIS — J431 Panlobular emphysema: Secondary | ICD-10-CM | POA: Diagnosis not present

## 2018-02-27 DIAGNOSIS — M5134 Other intervertebral disc degeneration, thoracic region: Secondary | ICD-10-CM | POA: Diagnosis not present

## 2018-02-27 DIAGNOSIS — Z79899 Other long term (current) drug therapy: Secondary | ICD-10-CM

## 2018-02-27 DIAGNOSIS — M810 Age-related osteoporosis without current pathological fracture: Secondary | ICD-10-CM

## 2018-02-27 DIAGNOSIS — E041 Nontoxic single thyroid nodule: Secondary | ICD-10-CM | POA: Diagnosis not present

## 2018-02-27 DIAGNOSIS — E782 Mixed hyperlipidemia: Secondary | ICD-10-CM

## 2018-02-27 DIAGNOSIS — R739 Hyperglycemia, unspecified: Secondary | ICD-10-CM

## 2018-02-27 DIAGNOSIS — I10 Essential (primary) hypertension: Secondary | ICD-10-CM | POA: Diagnosis not present

## 2018-02-27 DIAGNOSIS — K219 Gastro-esophageal reflux disease without esophagitis: Secondary | ICD-10-CM

## 2018-02-27 LAB — COMPLETE METABOLIC PANEL WITH GFR
AG RATIO: 1.4 (calc) (ref 1.0–2.5)
ALT: 15 U/L (ref 6–29)
AST: 23 U/L (ref 10–35)
Albumin: 4.3 g/dL (ref 3.6–5.1)
Alkaline phosphatase (APISO): 122 U/L (ref 33–130)
BILIRUBIN TOTAL: 0.7 mg/dL (ref 0.2–1.2)
BUN/Creatinine Ratio: 15 (calc) (ref 6–22)
BUN: 16 mg/dL (ref 7–25)
CHLORIDE: 105 mmol/L (ref 98–110)
CO2: 29 mmol/L (ref 20–32)
Calcium: 9.5 mg/dL (ref 8.6–10.4)
Creat: 1.1 mg/dL — ABNORMAL HIGH (ref 0.60–0.88)
GFR, Est African American: 53 mL/min/{1.73_m2} — ABNORMAL LOW (ref >=60)
GFR, Est Non African American: 45 mL/min/{1.73_m2} — ABNORMAL LOW (ref >=60)
GLUCOSE: 98 mg/dL (ref 65–139)
Globulin: 3 g/dL (calc) (ref 1.9–3.7)
POTASSIUM: 4.6 mmol/L (ref 3.5–5.3)
Sodium: 141 mmol/L (ref 135–146)
Total Protein: 7.3 g/dL (ref 6.1–8.1)

## 2018-02-27 LAB — CBC WITH DIFFERENTIAL/PLATELET
BASOS ABS: 38 {cells}/uL (ref 0–200)
Basophils Relative: 0.7 %
EOS PCT: 2.4 %
Eosinophils Absolute: 130 cells/uL (ref 15–500)
HEMATOCRIT: 39.3 % (ref 35.0–45.0)
HEMOGLOBIN: 13.2 g/dL (ref 11.7–15.5)
LYMPHS ABS: 1917 {cells}/uL (ref 850–3900)
MCH: 31.7 pg (ref 27.0–33.0)
MCHC: 33.6 g/dL (ref 32.0–36.0)
MCV: 94.5 fL (ref 80.0–100.0)
MPV: 10.4 fL (ref 7.5–12.5)
Monocytes Relative: 14.7 %
NEUTROS ABS: 2522 {cells}/uL (ref 1500–7800)
Neutrophils Relative %: 46.7 %
Platelets: 206 10*3/uL (ref 140–400)
RBC: 4.16 10*6/uL (ref 3.80–5.10)
RDW: 13 % (ref 11.0–15.0)
Total Lymphocyte: 35.5 %
WBC mixed population: 794 cells/uL (ref 200–950)
WBC: 5.4 10*3/uL (ref 3.8–10.8)

## 2018-02-27 LAB — LIPID PANEL
CHOL/HDL RATIO: 3 (calc) (ref <5.0)
Cholesterol: 162 mg/dL (ref <200)
HDL: 54 mg/dL (ref >50)
LDL Cholesterol (Calc): 80 mg/dL (calc)
NON-HDL CHOLESTEROL (CALC): 108 mg/dL (ref <130)
TRIGLYCERIDES: 181 mg/dL — AB (ref <150)

## 2018-02-27 MED ORDER — ROSUVASTATIN CALCIUM 5 MG PO TABS: 5.0000 mg | ORAL_TABLET | ORAL | 5 refills | Status: DC

## 2018-02-27 MED ORDER — LOSARTAN POTASSIUM 25 MG PO TABS: 12.5000 mg | ORAL_TABLET | Freq: Every day | ORAL | 5 refills | Status: DC

## 2018-02-27 MED ORDER — HYDROCODONE-ACETAMINOPHEN 10-325 MG PO TABS: 1.0000 | ORAL_TABLET | Freq: Four times a day (QID) | ORAL | 0 refills | Status: DC | PRN

## 2018-02-27 MED ORDER — RANITIDINE HCL 150 MG PO TABS: 150.0000 mg | ORAL_TABLET | Freq: Two times a day (BID) | ORAL | 3 refills | Status: DC

## 2018-02-27 MED ORDER — HYDROCODONE-ACETAMINOPHEN 10-325 MG PO TABS
1.0000 | ORAL_TABLET | Freq: Four times a day (QID) | ORAL | 0 refills | Status: DC | PRN
Start: 2018-02-27 — End: 2018-07-01

## 2018-02-27 MED ORDER — HYDROCODONE-ACETAMINOPHEN 10-325 MG PO TABS
1.0000 | ORAL_TABLET | Freq: Four times a day (QID) | ORAL | 0 refills | Status: DC | PRN
Start: 2018-02-27 — End: 2018-02-27

## 2018-02-27 NOTE — Progress Notes (Signed)
BP 132/82 (BP Location: Left Arm, Patient Position: Sitting, Cuff Size: Normal)   Pulse 76   Temp 97.8 F (36.6 C) (Oral)   Ht 5\' 4"  (1.626 m)   Wt 110 lb 4.8 oz (50 kg)   SpO2 90%   BMI 18.93 kg/m    Subjective:    Patient ID: Samantha Dawson, female    DOB: 1929-02-12, 84 y.o.   MRN: [id number]  HPI: Samantha Dawson is a 84 y.o. female  Chief Complaint  Patient presents with  . Medication Refill    HPI Patient is here for 3 month f/u No medical excitement since last visit Chronic pain issues; worst pain is in the back Using medicine every day;  Highest pain is maybe a 10 out of 10 With pain medicine, comes down to a 3 and 4 out of 10 Able to do chores and stay active at that level Reviewed PMP Aware; last fill was 01/28/18, prior 12/31/17, and 11/29/17  High cholesterol; trying to avoid fatty meats; does eat some cheese; maybe eating 3 or 4 egg yolks a week  HTN; not adding salt to food when cooking or at the table; has done that for years; on beta-blocekr and the losartan  GERD; taking H2 blocker; not doing very well she says; sometimes she has it twice a day; avoid OJ; typical triggers  Prediabetes; no family history; drinks sweet tea, not a whole lot; wheat bread; medicine gives her a little dry mouth; no blurred vision for hours; has problems with vision with macular degeneration  plavix on for life; no bleeding  Emphysema; stable, no sx; using inhalers  Thyroid nodules; followed by ENT; no sx US thyroid 08/23/17 IMPRESSION: 1. Slight interval enlargement of bilateral thyroid nodules. Neither meets criteria for biopsy. Recommend 1-2 year follow-up ultrasound, until stability x5 years demonstrated.  The above is in keeping with the ACR TI-RADS recommendations - J Am Coll Radiol 2017;14:587-595.   Electronically Signed   By: Lucrezia Europe M.D.   On: 08/23/2017 15:42  Osteoporosis; discussed fall precautions; railings on stairs; no grab bars; she broke  her leg; got the fidgets, a nerve is doing something, leg just jumps  Depression screen [facility] 2/9 02/27/2018 11/29/2017 08/29/2017 05/29/2017 02/27/2017  Decreased Interest 0 0 0 0 0  Down, Depressed, Hopeless 0 0 0 0 0  PHQ - 2 Score 0 0 0 0 0    Relevant past medical, surgical, family and social history reviewed Past Medical History:  Diagnosis Date  . Acid reflux 10/15/2016  . Anxiety   . Controlled substance agreement signed 03/22/2016  . 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 Encounter for chronic pain management   . Hx of smoking 04/17/2016  . Hyperlipidemia   . Hypertension   . Osteoarthritis   . Osteoporosis 03/22/2016  . Primary osteoarthritis of both hands 05/03/2016   Past Surgical History:  Procedure Laterality Date  . ABDOMINAL HYSTERECTOMY    . CATARACT EXTRACTION    . endaryerectomy    . renal stenting     Family History  Problem Relation Age of Onset  . Cancer Mother   . Coronary artery disease Father   . Cerebrovascular Accident Father   . Cancer Sister        breast cancer  . Cancer Brother        stomach  . Heart disease Daughter        heart vavle bypass?  . Stroke Sister   .  Cancer Sister        stomach  . Cancer Brother        unknown  . Lung disease Brother   . Arthritis Daughter    Social History   Tobacco Use  . Smoking status: Former Smoker    Packs/day: 0.20    Types: Cigarettes  . Smokeless tobacco: Never Used  . Tobacco comment: Haven't smoke since she went to the HOSP 09-02-16  Substance Use Topics  . Alcohol use: No    Alcohol/week: 0.0 oz  . Drug use: No    Interim medical history since last visit reviewed. Allergies and medications reviewed  Review of Systems Per HPI unless specifically indicated above     Objective:    BP 132/82 (BP Location: Left Arm, Patient Position: Sitting, Cuff Size: Normal)   Pulse 76   Temp 97.8 F (36.6 C) (Oral)   Ht 5\' 4"  (1.626 m)   Wt 110 lb 4.8 oz (50 kg)   SpO2 90%   BMI  18.93 kg/m   Wt Readings from Last 3 Encounters:  02/27/18 110 lb 4.8 oz (50 kg)  11/29/17 111 lb (50.3 kg)  09/08/17 123 lb (55.8 kg)    Physical Exam  Constitutional: She appears well-developed and well-nourished.  HENT:  Mouth/Throat: Mucous membranes are normal.  Eyes: EOM are normal. No scleral icterus.  Cardiovascular: Normal rate and regular rhythm.  Pulmonary/Chest: Effort normal and breath sounds normal.  Musculoskeletal:       Thoracic back: She exhibits tenderness and deformity (kyphosis).       Back:  Psychiatric: She has a normal mood and affect. Her behavior is normal.       Assessment & Plan:   Problem List Items Addressed This Visit      Cardiovascular and Mediastinum   Essential hypertension    Controlled; so proud of her for not using extra salt; refill meds      Relevant Medications   losartan (COZAAR) 25 MG tablet   rosuvastatin (CRESTOR) 5 MG tablet     Respiratory   Panlobular emphysema (HCC)    Stable with breathing, no limitations        Digestive   Acid reflux    Avoid triggers; increase H2 blocker to BID PRN; refill sent      Relevant Medications   ranitidine (ZANTAC) 150 MG tablet     Endocrine   Left thyroid nodule    Followed by ENT        Musculoskeletal and Integument   Degenerative disc disease, thoracic   Relevant Medications   HYDROcodone-acetaminophen (NORCO) 10-325 MG tablet   HYDROcodone-acetaminophen (NORCO) 10-325 MG tablet   HYDROcodone-acetaminophen (NORCO) 10-325 MG tablet   Osteoporosis - Primary    Calcium intake; fall precautions; pt politely declined DEXA scan      Relevant Orders   Ambulatory referral to Endocrinology   VITAMIN D 25 Hydroxy (Vit-D Deficiency, Fractures)     Other   Medication monitoring encounter   Relevant Orders   CBC with Differential/Platelet   COMPLETE METABOLIC PANEL WITH GFR   Hyperlipidemia   Relevant Medications   losartan (COZAAR) 25 MG tablet   rosuvastatin (CRESTOR)  5 MG tablet   Other Relevant Orders   Lipid panel   Hyperglycemia   Relevant Orders   Hemoglobin A1c   Controlled substance agreement signed   Relevant Orders   Urine Drug Screen w/Alc, no confirm       Follow up plan: Return in  about 3 months (around 05/29/2018) for follow-up visit with Dr. Sanda Klein.  An after-visit summary was printed and given to the patient at [place].  Please see the patient instructions which may contain other information and recommendations beyond what is mentioned above in the assessment and plan.  Meds ordered this encounter  Medications  . DISCONTD: HYDROcodone-acetaminophen (NORCO) 10-325 MG tablet    Sig: Take 1 tablet by mouth every 6 (six) hours as needed. #1 may fill now    Dispense:  105 tablet    Refill:  0  . DISCONTD: HYDROcodone-acetaminophen (NORCO) 10-325 MG tablet    Sig: Take 1 tablet by mouth every 6 (six) hours as needed.    Dispense:  105 tablet    Refill:  0    Rx #2 fill on or after Dec 29, 2017  . DISCONTD: HYDROcodone-acetaminophen (NORCO) 10-325 MG tablet    Sig: Take 1 tablet by mouth every 6 (six) hours as needed.    Dispense:  105 tablet    Refill:  0    Rx #3 fill no or after January 27, 2018  . losartan (COZAAR) 25 MG tablet    Sig: Take 0.5 tablets (12.5 mg total) by mouth daily.    Dispense:  15 tablet    Refill:  5    We're decreasing the dose a little; use this RX  . ranitidine (ZANTAC) 150 MG tablet    Sig: Take 1 tablet (150 mg total) by mouth 2 (two) times daily. If needed    Dispense:  180 tablet    Refill:  3  . rosuvastatin (CRESTOR) 5 MG tablet    Sig: Take 1 tablet (5 mg total) by mouth every other day. At bedtime    Dispense:  15 tablet    Refill:  5  . HYDROcodone-acetaminophen (NORCO) 10-325 MG tablet    Sig: Take 1 tablet by mouth every 6 (six) hours as needed. #1 may fill now    Dispense:  105 tablet    Refill:  0  . HYDROcodone-acetaminophen (NORCO) 10-325 MG tablet    Sig: Take 1 tablet by mouth  every 6 (six) hours as needed.    Dispense:  105 tablet    Refill:  0    Rx #2 fill on or after Mar 29, 2018  . HYDROcodone-acetaminophen (NORCO) 10-325 MG tablet    Sig: Take 1 tablet by mouth every 6 (six) hours as needed.    Dispense:  105 tablet    Refill:  0    Rx #3 fill no or after April 28, 2018    Orders Placed This Encounter  Procedures  . Urine Drug Screen w/Alc, no confirm  . CBC with Differential/Platelet  . COMPLETE METABOLIC PANEL WITH GFR  . Hemoglobin A1c  . Lipid panel  . VITAMIN D 25 Hydroxy (Vit-D Deficiency, Fractures)  . Ambulatory referral to Endocrinology

## 2018-02-27 NOTE — Assessment & Plan Note (Signed)
Calcium intake; fall precautions; pt politely declined DEXA scan

## 2018-02-27 NOTE — Patient Instructions (Addendum)
We'll get labs today We'll have you see the endocrinologist about medicine to strengthen your bones Try to limit saturated fats in your diet (bologna, hot dogs, barbeque, cheeseburgers, hamburgers, steak, bacon, sausage, cheese, etc.) and get more fresh fruits, vegetables, and whole grains  You can try 4 ounces of tonic water in the evening to help with leg cramps If that doesn't help, try magnesium oxide 250 mg if needed in the evening  Fall Prevention in the Home Falls can cause injuries. They can happen to people of all ages. There are many things you can do to make your home safe and to help prevent falls. What can I do on the outside of my home?  Regularly fix the edges of walkways and driveways and fix any cracks.  Remove anything that might make you trip as you walk through a door, such as a raised step or threshold.  Trim any bushes or trees on the path to your home.  Use bright outdoor lighting.  Clear any walking paths of anything that might make someone trip, such as rocks or tools.  Regularly check to see if handrails are loose or broken. Make sure that both sides of any steps have handrails.  Any raised decks and porches should have guardrails on the edges.  Have any leaves, snow, or ice cleared regularly.  Use sand or salt on walking paths during winter.  Clean up any spills in your garage right away. This includes oil or grease spills. What can I do in the bathroom?  Use night lights.  Install grab bars by the toilet and in the tub and shower. Do not use towel bars as grab bars.  Use non-skid mats or decals in the tub or shower.  If you need to sit down in the shower, use a plastic, non-slip stool.  Keep the floor dry. Clean up any water that spills on the floor as soon as it happens.  Remove soap buildup in the tub or shower regularly.  Attach bath mats securely with double-sided non-slip rug tape.  Do not have throw rugs and other things on the floor  that can make you trip. What can I do in the bedroom?  Use night lights.  Make sure that you have a light by your bed that is easy to reach.  Do not use any sheets or blankets that are too big for your bed. They should not hang down onto the floor.  Have a firm chair that has side arms. You can use this for support while you get dressed.  Do not have throw rugs and other things on the floor that can make you trip. What can I do in the kitchen?  Clean up any spills right away.  Avoid walking on wet floors.  Keep items that you use a lot in easy-to-reach places.  If you need to reach something above you, use a strong step stool that has a grab bar.  Keep electrical cords out of the way.  Do not use floor polish or wax that makes floors slippery. If you must use wax, use non-skid floor wax.  Do not have throw rugs and other things on the floor that can make you trip. What can I do with my stairs?  Do not leave any items on the stairs.  Make sure that there are handrails on both sides of the stairs and use them. Fix handrails that are broken or loose. Make sure that handrails are as long as  the stairways.  Check any carpeting to make sure that it is firmly attached to the stairs. Fix any carpet that is loose or worn.  Avoid having throw rugs at the top or bottom of the stairs. If you do have throw rugs, attach them to the floor with carpet tape.  Make sure that you have a light switch at the top of the stairs and the bottom of the stairs. If you do not have them, ask someone to add them for you. What else can I do to help prevent falls?  Wear shoes that: ? Do not have high heels. ? Have rubber bottoms. ? Are comfortable and fit you well. ? Are closed at the toe. Do not wear sandals.  If you use a stepladder: ? Make sure that it is fully opened. Do not climb a closed stepladder. ? Make sure that both sides of the stepladder are locked into place. ? Ask someone to hold it  for you, if possible.  Clearly mark and make sure that you can see: ? Any grab bars or handrails. ? First and last steps. ? Where the edge of each step is.  Use tools that help you move around (mobility aids) if they are needed. These include: ? Canes. ? Walkers. ? Scooters. ? Crutches.  Turn on the lights when you go into a dark area. Replace any light bulbs as soon as they burn out.  Set up your furniture so you have a clear path. Avoid moving your furniture around.  If any of your floors are uneven, fix them.  If there are any pets around you, be aware of where they are.  Review your medicines with your doctor. Some medicines can make you feel dizzy. This can increase your chance of falling. Ask your doctor what other things that you can do to help prevent falls. This information is not intended to replace advice given to you by your health care provider. Make sure you discuss any questions you have with your health care provider. Document Released: 08/19/2009 Document Revised: 03/30/2016 Document Reviewed: 11/27/2014 Elsevier Interactive Patient Education  [employer].

## 2018-02-27 NOTE — Assessment & Plan Note (Signed)
Controlled; so proud of her for not using extra salt; refill meds

## 2018-02-27 NOTE — Assessment & Plan Note (Signed)
Stable with breathing, no limitations

## 2018-02-27 NOTE — Assessment & Plan Note (Signed)
Avoid triggers; increase H2 blocker to BID PRN; refill sent

## 2018-02-27 NOTE — Assessment & Plan Note (Signed)
Followed by ENT 

## 2018-02-28 LAB — DRUG SCREEN URINE W/ALC, NO CONF
ALCOHOL, ETHYL (U): NEGATIVE
AMPHETAMINES (1000 NG/ML SCRN): NEGATIVE
BARBITURATES: NEGATIVE
BENZODIAZEPINES: NEGATIVE
COCAINE METABOLITES: NEGATIVE
MARIJUANA MET (50 ng/mL SCRN): NEGATIVE
METHADONE: NEGATIVE
METHAQUALONE: NEGATIVE
OPIATES: POSITIVE — AB
PHENCYCLIDINE: NEGATIVE
PROPOXYPHENE: NEGATIVE

## 2018-02-28 LAB — HEMOGLOBIN A1C
HEMOGLOBIN A1C: 5.8 %{Hb} — AB (ref <5.7)
MEAN PLASMA GLUCOSE: 120 (calc)
eAG (mmol/L): 6.6 (calc)

## 2018-02-28 LAB — VITAMIN D 25 HYDROXY (VIT D DEFICIENCY, FRACTURES): VIT D 25 HYDROXY: 38 ng/mL (ref 30–100)

## 2018-03-26 ENCOUNTER — Ambulatory Visit (INDEPENDENT_AMBULATORY_CARE_PROVIDER_SITE_OTHER): Payer: Medicare Other

## 2018-03-26 VITALS — BP 114/78 | HR 64 | Temp 98.3°F | Resp 12 | Ht 64.0 in | Wt 110.4 lb

## 2018-03-26 DIAGNOSIS — Z9181 History of falling: Secondary | ICD-10-CM

## 2018-03-26 DIAGNOSIS — Z0000 Encounter for general adult medical examination without abnormal findings: Secondary | ICD-10-CM | POA: Diagnosis not present

## 2018-03-26 NOTE — Patient Instructions (Addendum)
Samantha Dawson , Thank you for taking time to come for your Medicare Wellness Visit. I appreciate your ongoing commitment to your health goals. Please review the following plan we discussed and let me know if I can assist you in the future.   Screening recommendations/referrals: Colorectal Screening: No longer required Mammogram: No longer required Bone Density: No longer required Lung Cancer Screening: You do not qualify for this screening Hepatitis C Screening: You do not qualify for this screening  Vision and Dental Exams: Recommended annual ophthalmology exams for early detection of glaucoma and other disorders of the eye Recommended annual dental exams for proper oral hygiene  Vaccinations: Influenza vaccine: Up to date Pneumococcal vaccine: Completed series Tdap vaccine: Up to date Shingles vaccine: Up to date. Please call your insurance company to determine your out of pocket expense for the Shingrix vaccine. You may also receive this vaccine at your local pharmacy or Health Dept.  Advanced directives: Please bring a copy of your POA (Power of Attorney) and/or Living Will to your next appointment.  Conditions/risks identified: Recommend to drink at least 6-8 8oz glasses of water per day.  Next appointment: Please schedule your Annual Wellness Visit with your Nurse Health Advisor in one year.  Preventive Care 22 Years and Older, Female Preventive care refers to lifestyle choices and visits with your health care provider that can promote health and wellness. What does preventive care include?  A yearly physical exam. This is also called an annual well check.  Dental exams once or twice a year.  Routine eye exams. Ask your health care provider how often you should have your eyes checked.  Personal lifestyle choices, including:  Daily care of your teeth and gums.  Regular physical activity.  Eating a healthy diet.  Avoiding tobacco and drug use.  Limiting alcohol  use.  Practicing safe sex.  Taking low-dose aspirin every day.  Taking vitamin and mineral supplements as recommended by your health care provider. What happens during an annual well check? The services and screenings done by your health care provider during your annual well check will depend on your age, overall health, lifestyle risk factors, and family history of disease. Counseling  Your health care provider may ask you questions about your:  Alcohol use.  Tobacco use.  Drug use.  Emotional well-being.  Home and relationship well-being.  Sexual activity.  Eating habits.  History of falls.  Memory and ability to understand (cognition).  Work and work Statistician.  Reproductive health. Screening  You may have the following tests or measurements:  Height, weight, and BMI.  Blood pressure.  Lipid and cholesterol levels. These may be checked every 5 years, or more frequently if you are over 17 years old.  Skin check.  Lung cancer screening. You may have this screening every year starting at age 5 if you have a 30-pack-year history of smoking and currently smoke or have quit within the past 15 years.  Fecal occult blood test (FOBT) of the stool. You may have this test every year starting at age 34.  Flexible sigmoidoscopy or colonoscopy. You may have a sigmoidoscopy every 5 years or a colonoscopy every 10 years starting at age 95.  Hepatitis C blood test.  Hepatitis B blood test.  Sexually transmitted disease (STD) testing.  Diabetes screening. This is done by checking your blood sugar (glucose) after you have not eaten for a while (fasting). You may have this done every 1-3 years.  Bone density scan. This is done  to screen for osteoporosis. You may have this done starting at age 33.  Mammogram. This may be done every 1-2 years. Talk to your health care provider about how often you should have regular mammograms. Talk with your health care provider about  your test results, treatment options, and if necessary, the need for more tests. Vaccines  Your health care provider may recommend certain vaccines, such as:  Influenza vaccine. This is recommended every year.  Tetanus, diphtheria, and acellular pertussis (Tdap, Td) vaccine. You may need a Td booster every 10 years.  Zoster vaccine. You may need this after age 33.  Pneumococcal 13-valent conjugate (PCV13) vaccine. One dose is recommended after age 73.  Pneumococcal polysaccharide (PPSV23) vaccine. One dose is recommended after age 68. Talk to your health care provider about which screenings and vaccines you need and how often you need them. This information is not intended to replace advice given to you by your health care provider. Make sure you discuss any questions you have with your health care provider. Document Released: 11/19/2015 Document Revised: 07/12/2016 Document Reviewed: 08/24/2015 Elsevier Interactive Patient Education  2017 [employer] Prevention in the Home Falls can cause injuries. They can happen to people of all ages. There are many things you can do to make your home safe and to help prevent falls. What can I do on the outside of my home?  Regularly fix the edges of walkways and driveways and fix any cracks.  Remove anything that might make you trip as you walk through a door, such as a raised step or threshold.  Trim any bushes or trees on the path to your home.  Use bright outdoor lighting.  Clear any walking paths of anything that might make someone trip, such as rocks or tools.  Regularly check to see if handrails are loose or broken. Make sure that both sides of any steps have handrails.  Any raised decks and porches should have guardrails on the edges.  Have any leaves, snow, or ice cleared regularly.  Use sand or salt on walking paths during winter.  Clean up any spills in your garage right away. This includes oil or grease spills. What  can I do in the bathroom?  Use night lights.  Install grab bars by the toilet and in the tub and shower. Do not use towel bars as grab bars.  Use non-skid mats or decals in the tub or shower.  If you need to sit down in the shower, use a plastic, non-slip stool.  Keep the floor dry. Clean up any water that spills on the floor as soon as it happens.  Remove soap buildup in the tub or shower regularly.  Attach bath mats securely with double-sided non-slip rug tape.  Do not have throw rugs and other things on the floor that can make you trip. What can I do in the bedroom?  Use night lights.  Make sure that you have a light by your bed that is easy to reach.  Do not use any sheets or blankets that are too big for your bed. They should not hang down onto the floor.  Have a firm chair that has side arms. You can use this for support while you get dressed.  Do not have throw rugs and other things on the floor that can make you trip. What can I do in the kitchen?  Clean up any spills right away.  Avoid walking on wet floors.  Keep items  that you use a lot in easy-to-reach places.  If you need to reach something above you, use a strong step stool that has a grab bar.  Keep electrical cords out of the way.  Do not use floor polish or wax that makes floors slippery. If you must use wax, use non-skid floor wax.  Do not have throw rugs and other things on the floor that can make you trip. What can I do with my stairs?  Do not leave any items on the stairs.  Make sure that there are handrails on both sides of the stairs and use them. Fix handrails that are broken or loose. Make sure that handrails are as long as the stairways.  Check any carpeting to make sure that it is firmly attached to the stairs. Fix any carpet that is loose or worn.  Avoid having throw rugs at the top or bottom of the stairs. If you do have throw rugs, attach them to the floor with carpet tape.  Make sure  that you have a light switch at the top of the stairs and the bottom of the stairs. If you do not have them, ask someone to add them for you. What else can I do to help prevent falls?  Wear shoes that:  Do not have high heels.  Have rubber bottoms.  Are comfortable and fit you well.  Are closed at the toe. Do not wear sandals.  If you use a stepladder:  Make sure that it is fully opened. Do not climb a closed stepladder.  Make sure that both sides of the stepladder are locked into place.  Ask someone to hold it for you, if possible.  Clearly mark and make sure that you can see:  Any grab bars or handrails.  First and last steps.  Where the edge of each step is.  Use tools that help you move around (mobility aids) if they are needed. These include:  Canes.  Walkers.  Scooters.  Crutches.  Turn on the lights when you go into a dark area. Replace any light bulbs as soon as they burn out.  Set up your furniture so you have a clear path. Avoid moving your furniture around.  If any of your floors are uneven, fix them.  If there are any pets around you, be aware of where they are.  Review your medicines with your doctor. Some medicines can make you feel dizzy. This can increase your chance of falling. Ask your doctor what other things that you can do to help prevent falls. This information is not intended to replace advice given to you by your health care provider. Make sure you discuss any questions you have with your health care provider. Document Released: 08/19/2009 Document Revised: 03/30/2016 Document Reviewed: 11/27/2014 Elsevier Interactive Patient Education  2017 [employer].

## 2018-03-26 NOTE — Progress Notes (Signed)
Subjective:   Samantha Dawson is a 83 y.o. female who presents for an Initial Medicare Annual Wellness Visit.  Review of Systems    N/A  Cardiac Risk Factors include: advanced age (>56men, >35 women);dyslipidemia;hypertension;sedentary lifestyle     Objective:    Today's Vitals   03/26/18 0937  BP: 114/78  Pulse: 64  Resp: 12  Temp: 98.3 F (36.8 C)  TempSrc: Oral  SpO2: 92%  Weight: 110 lb 6.4 oz (50.1 kg)  Height: 5\' 4"  (1.626 m)   Body mass index is 18.95 kg/m.  Advanced Directives 03/26/2018 08/29/2017 05/29/2017 02/27/2017 11/29/2016 10/09/2016 09/08/2016  Does Patient Have a Medical Advance Directive? Yes Yes Yes Yes Yes - -  Type of Paramedic of [place];Living will [place];Living will - Living will Living will Living will Living will  Does patient want to make changes to medical advance directive? - - - - - - No - Patient declined  Copy of [place] in Chart? No - copy requested No - copy requested - - - No - copy requested No - copy requested  Would patient like information on creating a medical advance directive? - - - - - - -    Current Medications (verified) Outpatient Encounter Medications as of 03/26/2018  Medication Sig  . albuterol (PROAIR HFA) 108 (90 Base) MCG/ACT inhaler Inhale 2 puffs into the lungs every 6 (six) hours as needed for wheezing or shortness of breath.  Marland Kitchen aspirin 81 MG tablet Take 81 mg by mouth daily.  Marland Kitchen BEVESPI AEROSPHERE 9-4.8 MCG/ACT AERO USE 2 PUFFS TWICE A DAY  . clopidogrel (PLAVIX) 75 MG tablet Take 1 tablet (75 mg total) by mouth daily.  Marland Kitchen co-enzyme Q-10 30 MG capsule Take 100 mg by mouth daily.   . fluticasone (FLONASE) 50 MCG/ACT nasal spray Place 2 sprays into both nostrils as needed.   Marland Kitchen HYDROcodone-acetaminophen (NORCO) 10-325 MG tablet Take 1 tablet by mouth every 6 (six) hours as needed. #1 may fill now  . losartan (COZAAR) 25 MG tablet Take 0.5 tablets (12.5 mg  total) by mouth daily.  . metoprolol succinate (TOPROL-XL) 50 MG 24 hr tablet TAKE ONE TABLET BY MOUTH DAILY WITH OR IMMEDIATELY FOLLOWING A MEAL.  . pantoprazole (PROTONIX) 40 MG tablet Take 1 tablet (40 mg total) by mouth at bedtime.  . rosuvastatin (CRESTOR) 5 MG tablet Take 1 tablet (5 mg total) by mouth every other day. At bedtime  . Aspirin-Calcium Carbonate (BAYER WOMENS) [phone] MG TABS Take by mouth.  . fluorouracil (EFUDEX) 5 % cream   . HYDROcodone-acetaminophen (NORCO) 10-325 MG tablet Take 1 tablet by mouth every 6 (six) hours as needed.  Marland Kitchen HYDROcodone-acetaminophen (NORCO) 10-325 MG tablet Take 1 tablet by mouth every 6 (six) hours as needed.  . ranitidine (ZANTAC) 150 MG tablet Take 1 tablet (150 mg total) by mouth 2 (two) times daily. If needed   No facility-administered encounter medications on file as of 03/26/2018.     Allergies (verified) Patient has no known allergies.   Hospitalizations/ED visits and surgeries occurring within the previous 12 months:  Within the previous 12 months, pt has not underwent any surgical procedures and has not been hospitalized for any conditions. However, pt was treated on 09/18/17 @ [facility] ED for fall resulting in L tibial plateau fracture and R radius closed displaced fracture, treated by Dr. Rogers Blocker.  History: Past Medical History:  Diagnosis Date  . Acid reflux 10/15/2016  .  Anxiety   . Controlled substance agreement signed 03/22/2016  . 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 Encounter for chronic pain management   . Hx of smoking 04/17/2016  . Hyperlipidemia   . Hypertension   . Osteoarthritis   . Osteoporosis 03/22/2016  . Primary osteoarthritis of both hands 05/03/2016   Past Surgical History:  Procedure Laterality Date  . ABDOMINAL HYSTERECTOMY    . CATARACT EXTRACTION    . endaryerectomy    . renal stenting     Family History  Problem Relation Age of Onset  . Cancer Mother   . Coronary artery disease Father   .  Cerebrovascular Accident Father   . Cancer Sister        breast cancer  . Cancer Brother        stomach  . Heart disease Daughter        heart vavle bypass?  . Stroke Sister   . Lung disease Brother   . Cancer Brother        unknown  . Arthritis Daughter    Social History   Socioeconomic History  . Marital status: Widowed    Spouse name: Ihor Gully  . Number of children: 3  . Years of education: Not on file  . Highest education level: 10th grade  Occupational History  . Occupation: Retired  Scientific laboratory technician  . Financial resource strain: Not hard at all  . Food insecurity:    Worry: Never true    Inability: Never true  . Transportation needs:    Medical: No    Non-medical: No  Tobacco Use  . Smoking status: Former Smoker    Packs/day: 1.00    Years: 50.00    Pack years: 50.00    Types: Cigarettes    Last attempt to quit: 2017    Years since quitting: 2.3  . Smokeless tobacco: Never Used  . Tobacco comment: smoking cesssation materials not required  Substance and Sexual Activity  . Alcohol use: No    Alcohol/week: 0.0 oz  . Drug use: No  . Sexual activity: Not Currently  Lifestyle  . Physical activity:    Days per week: 0 days    Minutes per session: 0 min  . Stress: Not at all  Relationships  . Social connections:    Talks on phone: Patient refused    Gets together: Patient refused    Attends religious service: Patient refused    Active member of club or organization: Patient refused    Attends meetings of clubs or organizations: Patient refused    Relationship status: Widowed  Other Topics Concern  . Not on file  Social History Narrative  . Not on file    Tobacco Counseling Counseling given: No Comment: smoking cesssation materials not required  Clinical Intake:  Pre-visit preparation completed: Yes  Pain : No/denies pain   BMI - recorded: 18.95 Nutritional Status: BMI <19  Underweight Nutritional Risks: None Diabetes: No  How often do you need to  have someone help you when you read instructions, pamphlets, or other written materials from your doctor or pharmacy?: 1 - Never  Interpreter Needed?: No  Information entered by :: AEversole, LPN   Activities of Daily Living In your present state of health, do you have any difficulty performing the following activities: 03/26/2018 02/27/2018  Hearing? N N  Comment B hearing aids -  Vision? N N  Comment wears eyeglasses; macular degeneration -  Difficulty concentrating or making decisions? N N  Walking  or climbing stairs? N N  Dressing or bathing? N N  Doing errands, shopping? Y N  Comment family transports to and from appts -  Conservation officer, nature and eating ? N -  Comment full set upper and lower dentures -  Using the Toilet? N -  In the past six months, have you accidently leaked urine? Y -  Comment stress incontinence -  Do you have problems with loss of bowel control? N -  Managing your Medications? N -  Managing your Finances? N -  Housekeeping or managing your Housekeeping? N -  Some recent data might be hidden     Immunizations and Health Maintenance Immunization History  Administered Date(s) Administered  . Influenza, High Dose Seasonal PF 07/22/2015, 08/29/2016, 08/29/2017  . Pneumococcal Conjugate-13 05/26/2014  . Pneumococcal Polysaccharide-23 06/06/2012  . Tdap 09/09/2012  . Zoster 06/18/2008   There are no preventive care reminders to display for this patient.  Patient Care Team: Arnetha Courser, MD as PCP - General (Family Medicine) Clyde Canterbury, MD as Referring Physician (Otolaryngology) Baxter Kail, MD as Consulting Physician (Dermatology)  Indicate any recent Medical Services you may have received from other than [facility] providers in the past year (date may be approximate).     Assessment:   This is a routine wellness examination for Marieliz.  Hearing/Vision screen Vision Screening Comments: Sees Dr. Ellin Mayhew for annual eye exams  Dietary issues and  exercise activities discussed: Current Exercise Habits: The patient does not participate in regular exercise at present, Exercise limited by: None identified  Goals    . DIET - INCREASE WATER INTAKE     Recommend to drink at least 6-8 8oz glasses of water per day.      Depression Screen PHQ 2/9 Scores 02/27/2018 11/29/2017 08/29/2017 05/29/2017 02/27/2017 11/29/2016 10/09/2016  PHQ - 2 Score 0 0 0 0 0 0 0    Fall Risk Fall Risk  03/26/2018 02/27/2018 11/29/2017 08/29/2017 05/29/2017  Falls in the past year? Yes No Yes No No  Number falls in past yr: 1 - 1 - -  Injury with Fall? Yes - Yes - -  Comment Fractures - - - -  Risk Factor Category  High Fall Risk - - - -  Risk for fall due to : Impaired vision;Impaired balance/gait - - - -  Risk for fall due to: Comment ambulates with cane, wears eyeglasses, macular degeneration - - - -  Follow up Falls evaluation completed;Education provided;Falls prevention discussed - - - -    FALL RISK PREVENTION PERTAINING TO HOME: Is your home free of loose throw rugs in walkways, pet beds, electrical cords, etc? Yes Is there adequate lighting in your home to reduce risk of falls?  Yes Are there stairs in or around your home WITH handrails? Yes  ASSISTIVE DEVICES UTILIZED TO PREVENT FALLS: Use of a cane, walker or w/c? Yes, cane for ambulation Grab bars in the bathroom? No  Shower chair or a place to sit while bathing? No An elevated toilet seat or a handicapped toilet? No  Timed Get Up and Go Performed: Yes. Pt ambulated 10 feet within 28 sec. Gait slow, steady and with the use of an assistive device. No intervention required at this time. Fall risk prevention has been discussed.  Community Resource Referral:  Pt declined my offer to send [employer] Referral to Care Guide for installation of grab bars in the bathroom or for an elevated toilet seat. Pt did request Community Resource Referral be  sent for shower chair only.  Cognitive Function:       6CIT Screen 03/26/2018  What Year? 0 points  What month? 0 points  What time? 0 points  Count back from 20 0 points  Months in reverse 0 points  Repeat phrase 0 points  Total Score 0    Screening Tests Health Maintenance  Topic Date Due  . INFLUENZA VACCINE  06/06/2018  . TETANUS/TDAP  09/09/2022  . DEXA SCAN  Completed  . PNA vac Low Risk Adult  Completed  . MAMMOGRAM  Discontinued    Qualifies for Shingles Vaccine? Yes. Zostavax completed 06/18/08. Due for Shingrix. Education has been provided regarding the importance of this vaccine. Pt has been advised to call her insurance company to determine her out of pocket expense. Advised she may also receive this vaccine at her local pharmacy or Health Dept. Verbalized acceptance and understanding.  Cancer Screenings: Lung: Low Dose CT Chest recommended if Age 20-80 years, 30 pack-year currently smoking OR have quit w/in 15years. Patient does not qualify. Breast Screening: No longer required   Bone Density/Dexa: No longer required Colorectal: No longer required  Additional Screenings: Hepatitis C Screening: Does not qualify   Plan:  I have personally reviewed and addressed the Medicare Annual Wellness questionnaire and have noted the following in the patient's chart:  A. Medical and social history B. Use of alcohol, tobacco or illicit drugs  C. Current medications and supplements D. Functional ability and status E.  Nutritional status F.  Physical activity G. Advance directives H. List of other physicians I.  Hospitalizations, surgeries, and ER visits in previous 12 months J.  [place] such as hearing and vision if needed, cognitive and depression L. Referrals and appointments  In addition, I have reviewed and discussed with patient certain preventive protocols, quality metrics, and best practice recommendations. A written personalized care plan for preventive services as well as general preventive health  recommendations were provided to patient.  See attached scanned questionnaire for additional information.   Signed,  Aleatha Borer, LPN Nurse Health Advisor

## 2018-04-02 ENCOUNTER — Encounter: Payer: Self-pay | Admitting: Family Medicine

## 2018-04-02 ENCOUNTER — Other Ambulatory Visit: Payer: Self-pay | Admitting: Family Medicine

## 2018-04-02 MED ORDER — PANTOPRAZOLE SODIUM 20 MG PO TBEC: 20.0000 mg | DELAYED_RELEASE_TABLET | Freq: Every day | ORAL | 0 refills | Status: DC

## 2018-04-02 NOTE — Telephone Encounter (Signed)
Lowering dose of PPI, osteoporosis

## 2018-04-29 ENCOUNTER — Other Ambulatory Visit: Payer: Self-pay | Admitting: Family Medicine

## 2018-05-29 ENCOUNTER — Ambulatory Visit (INDEPENDENT_AMBULATORY_CARE_PROVIDER_SITE_OTHER): Payer: Medicare Other | Admitting: Family Medicine

## 2018-05-29 ENCOUNTER — Encounter: Payer: Self-pay | Admitting: Family Medicine

## 2018-05-29 VITALS — BP 112/74 | HR 73 | Temp 97.8°F | Resp 12 | Ht 64.0 in | Wt 113.7 lb

## 2018-05-29 DIAGNOSIS — G8929 Other chronic pain: Secondary | ICD-10-CM

## 2018-05-29 DIAGNOSIS — E782 Mixed hyperlipidemia: Secondary | ICD-10-CM | POA: Diagnosis not present

## 2018-05-29 DIAGNOSIS — M810 Age-related osteoporosis without current pathological fracture: Secondary | ICD-10-CM | POA: Diagnosis not present

## 2018-05-29 DIAGNOSIS — E441 Mild protein-calorie malnutrition: Secondary | ICD-10-CM

## 2018-05-29 DIAGNOSIS — I10 Essential (primary) hypertension: Secondary | ICD-10-CM | POA: Diagnosis not present

## 2018-05-29 DIAGNOSIS — M546 Pain in thoracic spine: Secondary | ICD-10-CM

## 2018-05-29 DIAGNOSIS — J431 Panlobular emphysema: Secondary | ICD-10-CM | POA: Diagnosis not present

## 2018-05-29 DIAGNOSIS — K219 Gastro-esophageal reflux disease without esophagitis: Secondary | ICD-10-CM | POA: Diagnosis not present

## 2018-05-29 DIAGNOSIS — M25512 Pain in left shoulder: Secondary | ICD-10-CM

## 2018-05-29 DIAGNOSIS — M5134 Other intervertebral disc degeneration, thoracic region: Secondary | ICD-10-CM

## 2018-05-29 DIAGNOSIS — R739 Hyperglycemia, unspecified: Secondary | ICD-10-CM

## 2018-05-29 MED ORDER — GLYCOPYRROLATE-FORMOTEROL 9-4.8 MCG/ACT IN AERO: 2.0000 | INHALATION_SPRAY | Freq: Two times a day (BID) | RESPIRATORY_TRACT | 11 refills | Status: DC

## 2018-05-29 MED ORDER — HYDROCODONE-ACETAMINOPHEN 10-325 MG PO TABS: 1.0000 | ORAL_TABLET | Freq: Four times a day (QID) | ORAL | 0 refills | Status: DC | PRN

## 2018-05-29 MED ORDER — METOPROLOL SUCCINATE ER 50 MG PO TB24: 50.0000 mg | ORAL_TABLET | Freq: Every day | ORAL | 11 refills | Status: DC

## 2018-05-29 MED ORDER — ALBUTEROL SULFATE HFA 108 (90 BASE) MCG/ACT IN AERS: INHALATION_SPRAY | RESPIRATORY_TRACT | 2 refills | Status: DC

## 2018-05-29 NOTE — Progress Notes (Signed)
BP 112/74   Pulse 73   Temp 97.8 F (36.6 C) (Oral)   Resp 12   Ht 5\' 4"  (1.626 m)   Wt 113 lb 11.2 oz (51.6 kg)   SpO2 93%   BMI 19.52 kg/m    Subjective:    Patient ID: Samantha Dawson, female    DOB: [birth year], 84 y.o.   MRN: [id number]  HPI: Samantha Dawson is a 84 y.o. female  Chief Complaint  Patient presents with  . Follow-up    HPI Patient is here for f/u  Hypertension Losartan 25mg  daily, metoprolol 50mg  daily- puts the in weekly pill pouch, no missed doses in the last month. Denies lightheadedness, dizziness, chest pain, blurry vision or headaches. Has low salt diet.  BP Readings from Last 3 Encounters:  05/29/18 112/74  03/26/18 114/78  02/27/18 132/82   GERD Patient takes pantoprazole daily- states gets heart burn every other day. States triggered by tomatoes but still eats them- has some in her garden.   Emphysema Patient using her Bevespi inhaler once a day. States uses her albuterol inhaler 2-3 times a day. She is noticing more exertional shortness of breath. Denies cough. Quit smoking in 2017.  Osteoporosis Patient takes a vitamin D every day, and bayer women's which has calcium. Lots of walking- she is shopper. Was referred to endocrinology last visit but was unable to make an appointment at that time- she states she is not interested in this right now. She does not remember ever taking a pill for osteoporosis. Never took it; has not done prolia; she then says she did take the once a week pill years ago and it upset her stomach  Chronic Pain Patient has chronic back pain and shoulders- patient is taking Vicodin every 6-8 hours. This is chronic; she believes that this medicine improves her quality of life, helps her get around  Protein calorie malnutrition Appetite is the same. Eats when she gets hungry- states she will eat anything, snacks a lot.  Wt Readings from Last 3 Encounters:  05/29/18 113 lb 11.2 oz (51.6 kg)  03/26/18 110 lb 6.4 oz (50.1  kg)  02/27/18 110 lb 4.8 oz (50 kg)   Hyperlipidemia Patient is taking rosuvastatin 5mg  every other day.   Stress Incontinence Tried kegels but notes she is still going often, worse with coughing and bending.   Depression screen [facility] 2/9 05/29/2018 02/27/2018 11/29/2017 08/29/2017 05/29/2017  Decreased Interest 0 0 0 0 0  Down, Depressed, Hopeless 0 0 0 0 0  PHQ - 2 Score 0 0 0 0 0    Relevant past medical, surgical, family and social history reviewed Past Medical History:  Diagnosis Date  . Acid reflux 10/15/2016  . Anxiety   . Controlled substance agreement signed 03/22/2016  . 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 Encounter for chronic pain management   . Hx of smoking 04/17/2016  . Hyperlipidemia   . Hypertension   . Osteoarthritis   . Osteoporosis 03/22/2016  . Primary osteoarthritis of both hands 05/03/2016   Past Surgical History:  Procedure Laterality Date  . ABDOMINAL HYSTERECTOMY    . CATARACT EXTRACTION    . endaryerectomy    . renal stenting     Family History  Problem Relation Age of Onset  . Cancer Mother   . Coronary artery disease Father   . Cerebrovascular Accident Father   . Cancer Sister        breast cancer  . Cancer Brother  stomach  . Heart disease Daughter        heart vavle bypass?  . Stroke Sister   . Lung disease Brother   . Cancer Brother        unknown  . Arthritis Daughter    Social History   Tobacco Use  . Smoking status: Former Smoker    Packs/day: 1.00    Years: 50.00    Pack years: 50.00    Types: Cigarettes    Last attempt to quit: 2017    Years since quitting: 2.5  . Smokeless tobacco: Never Used  . Tobacco comment: smoking cesssation materials not required  Substance Use Topics  . Alcohol use: No    Alcohol/week: 0.0 oz  . Drug use: No    Interim medical history since last visit reviewed. Allergies and medications reviewed  Review of Systems Per HPI unless specifically indicated above       Objective:    BP 112/74   Pulse 73   Temp 97.8 F (36.6 C) (Oral)   Resp 12   Ht 5\' 4"  (1.626 m)   Wt 113 lb 11.2 oz (51.6 kg)   SpO2 93%   BMI 19.52 kg/m   Wt Readings from Last 3 Encounters:  05/29/18 113 lb 11.2 oz (51.6 kg)  03/26/18 110 lb 6.4 oz (50.1 kg)  02/27/18 110 lb 4.8 oz (50 kg)    Physical Exam  Constitutional: She appears well-developed and well-nourished. No distress.  HENT:  Head: Normocephalic and atraumatic.  Eyes: EOM are normal. No scleral icterus.  Neck: No thyromegaly present.  Cardiovascular: Normal rate, regular rhythm and normal heart sounds.  No murmur heard. Pulmonary/Chest: Effort normal and breath sounds normal. No respiratory distress. She has no wheezes.  Abdominal: Soft. Bowel sounds are normal. She exhibits no distension.  Musculoskeletal: Normal range of motion. She exhibits no edema.  Neurological: She is alert. She exhibits normal muscle tone.  Skin: Skin is warm and dry. She is not diaphoretic. No pallor.  Psychiatric: She has a normal mood and affect. Her behavior is normal. Judgment and thought content normal.        Assessment & Plan:   Problem List Items Addressed This Visit      Cardiovascular and Mediastinum   Essential hypertension - Primary (Chronic)    Controlled today; continue regimen      Relevant Medications   metoprolol succinate (TOPROL-XL) 50 MG 24 hr tablet     Respiratory   Panlobular emphysema (HCC) (Chronic)    She wishes to continue same inhaler; declined offer for switching agents or adding another      Relevant Medications   Glycopyrrolate-Formoterol (BEVESPI AEROSPHERE) 9-4.8 MCG/ACT AERO   albuterol (PROAIR HFA) 108 (90 Base) MCG/ACT inhaler     Digestive   Acid reflux    Try to limit triggers; she is on med that can worsen osteoporosis; trying to use every other day for that reason        Musculoskeletal and Integument   Osteoporosis (Chronic)    Patient does not want to see  endocrinologist right now to discuss treatment options; did not tolerate bisphosphonates in the past      Relevant Orders   DG Bone Density   Degenerative disc disease, thoracic (Chronic)    Causing chronic pain; narcotics being used to improve function, enhance quality of life; no adverse side effects; no concerns for diversion or mises      Relevant Medications   HYDROcodone-acetaminophen (NORCO) 10-325  MG tablet     Other   Hyperglycemia   Shoulder pain, left   Back pain   Relevant Medications   HYDROcodone-acetaminophen (NORCO) 10-325 MG tablet   Protein calorie malnutrition (HCC)    Discussed diet      Hyperlipidemia (Chronic)    Continue statin; healthy eating      Relevant Medications   metoprolol succinate (TOPROL-XL) 50 MG 24 hr tablet       Follow up plan: Return in about 3 months (around 08/29/2018) for follow-up visit with Dr. Sanda Klein.  An after-visit summary was printed and given to the patient at [place].  Please see the patient instructions which may contain other information and recommendations beyond what is mentioned above in the assessment and plan.  Meds ordered this encounter  Medications  . Glycopyrrolate-Formoterol (BEVESPI AEROSPHERE) 9-4.8 MCG/ACT AERO    Sig: Take 2 puffs by mouth 2 (two) times daily.    Dispense:  1 Inhaler    Refill:  11  . metoprolol succinate (TOPROL-XL) 50 MG 24 hr tablet    Sig: Take 1 tablet (50 mg total) by mouth daily. Take with or immediately following a meal.    Dispense:  30 tablet    Refill:  11  . HYDROcodone-acetaminophen (NORCO) 10-325 MG tablet    Sig: Take 1 tablet by mouth every 6 (six) hours as needed. #1 may fill now    Dispense:  105 tablet    Refill:  0  . albuterol (PROAIR HFA) 108 (90 Base) MCG/ACT inhaler    Sig: Inhale 2 puffs into the lungs every 6 (six) hours as needed for wheezing or shortness of breath.    Dispense:  8.5 Inhaler    Refill:  2    Orders Placed This Encounter  Procedures    . DG Bone Density

## 2018-05-29 NOTE — Patient Instructions (Addendum)
- Recommend taking 1-2 tums 5mnutes to an hour before eating foods that cause your heartburn like tomatoes - Recommend a nutritional supplement like boost or ensure 3-5 times a week to ensure you are getting good nutrition.  - Recommend cutting down on caffeine, increasing water intake and stopping fluids two hours before bedtime.   Consider the injection called Prolia  Denosumab injection What is this medicine? DENOSUMAB (den oh sue mab) slows bone breakdown. Prolia is used to treat osteoporosis in women after menopause and in men. XDelton Seeis used to treat a high calcium level due to cancer and to prevent bone fractures and other bone problems caused by multiple myeloma or cancer bone metastases. XDelton Seeis also used to treat giant cell tumor of the bone. This medicine may be used for other purposes; ask your health care provider or pharmacist if you have questions. COMMON BRAND NAME(S): Prolia, XGEVA What should I tell my health care provider before I take this medicine? They need to know if you have any of these conditions: -dental disease -having surgery or tooth extraction -infection -kidney disease -low levels of calcium or Vitamin D in the blood -malnutrition -on hemodialysis -skin conditions or sensitivity -thyroid or parathyroid disease -an unusual reaction to denosumab, other medicines, foods, dyes, or preservatives -pregnant or trying to get pregnant -breast-feeding How should I use this medicine? This medicine is for injection under the skin. It is given by a health care professional in a hospital or clinic setting. If you are getting Prolia, a special MedGuide will be given to you by the pharmacist with each prescription and refill. Be sure to read this information carefully each time. For Prolia, talk to your pediatrician regarding the use of this medicine in children. Special care may be needed. For XDelton See talk to your pediatrician regarding the use of this medicine in  children. While this drug may be prescribed for children as young as 13 years for selected conditions, precautions do apply. Overdosage: If you think you have taken too much of this medicine contact a poison control center or emergency room at once. NOTE: This medicine is only for you. Do not share this medicine with others. What if I miss a dose? It is important not to miss your dose. Call your doctor or health care professional if you are unable to keep an appointment. What may interact with this medicine? Do not take this medicine with any of the following medications: -other medicines containing denosumab This medicine may also interact with the following medications: -medicines that lower your chance of fighting infection -steroid medicines like prednisone or cortisone This list may not describe all possible interactions. Give your health care provider a list of all the medicines, herbs, non-prescription drugs, or dietary supplements you use. Also tell them if you smoke, drink alcohol, or use illegal drugs. Some items may interact with your medicine. What should I watch for while using this medicine? Visit your doctor or health care professional for regular checks on your progress. Your doctor or health care professional may order blood tests and other tests to see how you are doing. Call your doctor or health care professional for advice if you get a fever, chills or sore throat, or other symptoms of a cold or flu. Do not treat yourself. This drug may decrease your body's ability to fight infection. Try to avoid being around people who are sick. You should make sure you get enough calcium and vitamin D while you are taking this  medicine, unless your doctor tells you not to. Discuss the foods you eat and the vitamins you take with your health care professional. See your dentist regularly. Brush and floss your teeth as directed. Before you have any dental work done, tell your dentist you are  receiving this medicine. Do not become pregnant while taking this medicine or for 5 months after stopping it. Talk with your doctor or health care professional about your birth control options while taking this medicine. Women should inform their doctor if they wish to become pregnant or think they might be pregnant. There is a potential for serious side effects to an unborn child. Talk to your health care professional or pharmacist for more information. What side effects may I notice from receiving this medicine? Side effects that you should report to your doctor or health care professional as soon as possible: -allergic reactions like skin rash, itching or hives, swelling of the face, lips, or tongue -bone pain -breathing problems -dizziness -jaw pain, especially after dental work -redness, blistering, peeling of the skin -signs and symptoms of infection like fever or chills; cough; sore throat; pain or trouble passing urine -signs of low calcium like fast heartbeat, muscle cramps or muscle pain; pain, tingling, numbness in the hands or feet; seizures -unusual bleeding or bruising -unusually weak or tired Side effects that usually do not require medical attention (report to your doctor or health care professional if they continue or are bothersome): -constipation -diarrhea -headache -joint pain -loss of appetite -muscle pain -runny nose -tiredness -upset stomach This list may not describe all possible side effects. Call your doctor for medical advice about side effects. You may report side effects to FDA at 1-800-FDA-1088. Where should I keep my medicine? This medicine is only given in a clinic, doctor's office, or other health care setting and will not be stored at home. NOTE: This sheet is a summary. It may not cover all possible information. If you have questions about this medicine, talk to your doctor, pharmacist, or health care provider.  2018 Elsevier/Gold Standard (2016-11-14  19:17:21)  Please do call to schedule your bone density study; the number to schedule one at either [facility] or [facility] is [phone] or [phone]

## 2018-06-03 ENCOUNTER — Telehealth: Payer: Self-pay | Admitting: Family Medicine

## 2018-06-03 MED ORDER — ALENDRONATE SODIUM 70 MG PO TABS: 70.0000 mg | ORAL_TABLET | ORAL | 11 refills | Status: DC

## 2018-06-03 NOTE — Telephone Encounter (Signed)
Rx sent 

## 2018-06-03 NOTE — Telephone Encounter (Signed)
Copied from [place] [id number]. Topic: General - Other >> Jun 03, 2018  2:19 PM Lennox Solders wrote: Reason for CRM: Pt is calling and would like a new rx  for fosamax . [place]. Pt does not want the prolia

## 2018-06-05 NOTE — Assessment & Plan Note (Signed)
Controlled today; continue regimen 

## 2018-06-05 NOTE — Assessment & Plan Note (Signed)
Continue statin; healthy eating

## 2018-06-05 NOTE — Assessment & Plan Note (Signed)
She wishes to continue same inhaler; declined offer for switching agents or adding another

## 2018-06-05 NOTE — Assessment & Plan Note (Signed)
Discussed diet  

## 2018-06-05 NOTE — Assessment & Plan Note (Signed)
Try to limit triggers; she is on med that can worsen osteoporosis; trying to use every other day for that reason

## 2018-06-05 NOTE — Assessment & Plan Note (Signed)
Causing chronic pain; narcotics being used to improve function, enhance quality of life; no adverse side effects; no concerns for diversion or mises

## 2018-06-05 NOTE — Assessment & Plan Note (Signed)
Patient does not want to see endocrinologist right now to discuss treatment options; did not tolerate bisphosphonates in the past

## 2018-06-24 ENCOUNTER — Telehealth: Payer: Self-pay | Admitting: Family Medicine

## 2018-06-24 DIAGNOSIS — M810 Age-related osteoporosis without current pathological fracture: Secondary | ICD-10-CM

## 2018-06-24 NOTE — Telephone Encounter (Signed)
Copied from [place] [id number]. Topic: Quick Communication - See Telephone Encounter >> Jun 24, 2018 11:13 AM Bea Graff, NT wrote: CRM for notification. See Telephone encounter for: 06/24/18. Pt states that the alendronate (FOSAMAX) 70 MG tablet makes her sick on her stomach when she takes it, and she would like advice of what to do or if something else can be ordered.

## 2018-06-25 NOTE — Telephone Encounter (Signed)
Pt.notified

## 2018-06-25 NOTE — Telephone Encounter (Signed)
STOP the alendronate Please add as intolerance in allergy list I'll refer her to an endocrinologist for other treatment options

## 2018-07-01 ENCOUNTER — Other Ambulatory Visit: Payer: Self-pay | Admitting: Family Medicine

## 2018-07-01 DIAGNOSIS — M5134 Other intervertebral disc degeneration, thoracic region: Secondary | ICD-10-CM

## 2018-07-01 MED ORDER — HYDROCODONE-ACETAMINOPHEN 10-325 MG PO TABS: 1.0000 | ORAL_TABLET | Freq: Four times a day (QID) | ORAL | 0 refills | Status: DC | PRN

## 2018-07-01 NOTE — Telephone Encounter (Signed)
Copied from [place] [id number]. Topic: Quick Communication - Rx Refill/Question >> Jul 01, 2018 10:03 AM Synthia Innocent wrote: Medication: HYDROcodone-acetaminophen (NORCO) 10-325 MG tablet  Has the patient contacted their pharmacy? Yes.   (Agent: If no, request that the patient contact the pharmacy for the refill.) (Agent: If yes, when and what did the pharmacy advise?)  Preferred Pharmacy (with phone number or street name): [facility]  Agent: Please be advised that RX refills may take up to 3 business days. We ask that you follow-up with your pharmacy.

## 2018-07-15 ENCOUNTER — Ambulatory Visit
Admission: RE | Admit: 2018-07-15 | Discharge: 2018-07-15 | Disposition: A | Discharge status: Home / Self Care | Payer: Medicare Other | Source: Ambulatory Visit | Attending: Family Medicine | Admitting: Family Medicine

## 2018-07-15 DIAGNOSIS — M810 Age-related osteoporosis without current pathological fracture: Secondary | ICD-10-CM | POA: Insufficient documentation

## 2018-07-29 ENCOUNTER — Other Ambulatory Visit: Payer: Self-pay | Admitting: Family Medicine

## 2018-07-29 DIAGNOSIS — M5134 Other intervertebral disc degeneration, thoracic region: Secondary | ICD-10-CM

## 2018-07-29 MED ORDER — HYDROCODONE-ACETAMINOPHEN 10-325 MG PO TABS
1.0000 | ORAL_TABLET | Freq: Four times a day (QID) | ORAL | 0 refills | Status: DC | PRN
Start: 2018-07-29 — End: 2018-08-27

## 2018-07-29 NOTE — Telephone Encounter (Signed)
PMP Aware reviewed, Rx sent

## 2018-07-29 NOTE — Telephone Encounter (Signed)
Copied from [place] [id number]. Topic: Quick Communication - See Telephone Encounter >> Jul 29, 2018  3:37 PM Ivar Drape wrote: CRM for notification. See Telephone encounter for: 07/29/18. Patient would like a refill on her HYDROcodone-acetaminophen (Addy) 10-325 MG tablet medication and have it sent to her preferred pharmacy [facility]

## 2018-07-30 ENCOUNTER — Other Ambulatory Visit: Payer: Self-pay | Admitting: Family Medicine

## 2018-07-30 NOTE — Telephone Encounter (Signed)
Please contact patient Find out how long she has been on Plavix and what is the reason she takes it We want to update our chart, as some people only need to take this for a limited amount of time

## 2018-07-30 NOTE — Telephone Encounter (Signed)
Left detailed voicemial 

## 2018-07-31 NOTE — Telephone Encounter (Signed)
Pt states she doesn't really know but the heart doctor placed her on it?

## 2018-07-31 NOTE — Telephone Encounter (Signed)
Please have her call her heart doctor then; he'll know if she needs to keep taking this or not and he can refill if appropriate

## 2018-08-01 ENCOUNTER — Other Ambulatory Visit: Payer: Self-pay | Admitting: Family Medicine

## 2018-08-01 ENCOUNTER — Telehealth: Payer: Self-pay | Admitting: Family Medicine

## 2018-08-01 DIAGNOSIS — Z79899 Other long term (current) drug therapy: Secondary | ICD-10-CM

## 2018-08-01 MED ORDER — CLOPIDOGREL BISULFATE 75 MG PO TABS: 75.0000 mg | ORAL_TABLET | Freq: Every day | ORAL | 0 refills | Status: DC

## 2018-08-01 NOTE — Telephone Encounter (Signed)
Pt notified 30 day supply sent in and we will refer to cardio

## 2018-08-01 NOTE — Telephone Encounter (Signed)
[facility]ops[facility] record from 09/02/16 reviewed; she was discharged home with plavix which was not new; Dr. Rockey Situ was consulted during that visit  I looked back in Dr. Walker Kehr notes; she has been on Plavix for many years; it shows Dr. Humphrey Rolls was prescribing  Please contact Dr. Laurelyn Sickle office and ask if they are willing to prescribe if still needed; how long was she supposed to be on this in the first place, and does she need f/u with him

## 2018-08-01 NOTE — Progress Notes (Signed)
Sending one month, refer back to  Dr. Humphrey Rolls

## 2018-08-01 NOTE — Telephone Encounter (Signed)
Pt states she does not see a cardiologist, when she was in hospital last in 2017 she believes Joaquin place her on this due to heart blockages and never saw him in office?

## 2018-08-01 NOTE — Addendum Note (Signed)
Addended by: Docia Furl on: 08/01/2018 03:10 PM   Modules accepted: Orders

## 2018-08-01 NOTE — Telephone Encounter (Signed)
Left detailed voicemail with Dr. Serena Colonel nurse to call back

## 2018-08-01 NOTE — Telephone Encounter (Signed)
Kenney Houseman, Dr. Marella Bile nurse called back.  She can be reached at [phone] x. [phone]

## 2018-08-01 NOTE — Telephone Encounter (Signed)
Left another voicemial

## 2018-08-05 DIAGNOSIS — M810 Age-related osteoporosis without current pathological fracture: Secondary | ICD-10-CM | POA: Diagnosis not present

## 2018-08-07 DIAGNOSIS — H353132 Nonexudative age-related macular degeneration, bilateral, intermediate dry stage: Secondary | ICD-10-CM | POA: Diagnosis not present

## 2018-08-07 DIAGNOSIS — Z961 Presence of intraocular lens: Secondary | ICD-10-CM | POA: Diagnosis not present

## 2018-08-11 DIAGNOSIS — M810 Age-related osteoporosis without current pathological fracture: Secondary | ICD-10-CM | POA: Insufficient documentation

## 2018-08-20 DIAGNOSIS — I2510 Atherosclerotic heart disease of native coronary artery without angina pectoris: Secondary | ICD-10-CM | POA: Diagnosis not present

## 2018-08-20 DIAGNOSIS — I10 Essential (primary) hypertension: Secondary | ICD-10-CM | POA: Diagnosis not present

## 2018-08-20 DIAGNOSIS — R0602 Shortness of breath: Secondary | ICD-10-CM | POA: Diagnosis not present

## 2018-08-20 DIAGNOSIS — I739 Peripheral vascular disease, unspecified: Secondary | ICD-10-CM | POA: Diagnosis not present

## 2018-08-23 ENCOUNTER — Ambulatory Visit
Admission: RE | Admit: 2018-08-23 | Discharge: 2018-08-23 | Disposition: A | Discharge status: Home / Self Care | Payer: Medicare Other | Source: Ambulatory Visit | Attending: Otolaryngology | Admitting: Otolaryngology

## 2018-08-23 DIAGNOSIS — E042 Nontoxic multinodular goiter: Secondary | ICD-10-CM | POA: Insufficient documentation

## 2018-08-23 DIAGNOSIS — E041 Nontoxic single thyroid nodule: Secondary | ICD-10-CM

## 2018-08-27 ENCOUNTER — Ambulatory Visit (INDEPENDENT_AMBULATORY_CARE_PROVIDER_SITE_OTHER): Payer: Medicare Other | Admitting: Family Medicine

## 2018-08-27 ENCOUNTER — Encounter: Payer: Self-pay | Admitting: Family Medicine

## 2018-08-27 VITALS — BP 126/80 | HR 81 | Temp 97.8°F | Ht 64.0 in | Wt 118.1 lb

## 2018-08-27 DIAGNOSIS — M5134 Other intervertebral disc degeneration, thoracic region: Secondary | ICD-10-CM

## 2018-08-27 DIAGNOSIS — I10 Essential (primary) hypertension: Secondary | ICD-10-CM

## 2018-08-27 DIAGNOSIS — Z5181 Encounter for therapeutic drug level monitoring: Secondary | ICD-10-CM | POA: Diagnosis not present

## 2018-08-27 DIAGNOSIS — R0989 Other specified symptoms and signs involving the circulatory and respiratory systems: Secondary | ICD-10-CM | POA: Diagnosis not present

## 2018-08-27 DIAGNOSIS — E782 Mixed hyperlipidemia: Secondary | ICD-10-CM | POA: Diagnosis not present

## 2018-08-27 DIAGNOSIS — G894 Chronic pain syndrome: Secondary | ICD-10-CM | POA: Diagnosis not present

## 2018-08-27 DIAGNOSIS — E041 Nontoxic single thyroid nodule: Secondary | ICD-10-CM | POA: Diagnosis not present

## 2018-08-27 DIAGNOSIS — Z23 Encounter for immunization: Secondary | ICD-10-CM

## 2018-08-27 DIAGNOSIS — I6523 Occlusion and stenosis of bilateral carotid arteries: Secondary | ICD-10-CM

## 2018-08-27 DIAGNOSIS — R7303 Prediabetes: Secondary | ICD-10-CM

## 2018-08-27 DIAGNOSIS — N183 Chronic kidney disease, stage 3 unspecified: Secondary | ICD-10-CM

## 2018-08-27 DIAGNOSIS — M810 Age-related osteoporosis without current pathological fracture: Secondary | ICD-10-CM

## 2018-08-27 MED ORDER — HYDROCODONE-ACETAMINOPHEN 10-325 MG PO TABS: 1.0000 | ORAL_TABLET | Freq: Four times a day (QID) | ORAL | 0 refills | Status: DC | PRN

## 2018-08-27 MED ORDER — LOSARTAN POTASSIUM 25 MG PO TABS: 12.5000 mg | ORAL_TABLET | Freq: Every day | ORAL | 5 refills | Status: DC

## 2018-08-27 MED ORDER — CLOPIDOGREL BISULFATE 75 MG PO TABS: 75.0000 mg | ORAL_TABLET | Freq: Every day | ORAL | 11 refills | Status: DC

## 2018-08-27 NOTE — Assessment & Plan Note (Signed)
continnue opiate therapy; avoiding NSAIDs because of kidneys

## 2018-08-27 NOTE — Assessment & Plan Note (Signed)
Managed here; patient wishes to continue opiate therapy; no adverse side effects; she believes this improves her QOL Reviewed PMP Aware and no red flags

## 2018-08-27 NOTE — Assessment & Plan Note (Signed)
Check lipids today; goal LDL is less than 70

## 2018-08-27 NOTE — Assessment & Plan Note (Signed)
With bruit; cardiologist has ordered scan; patient to contact me if she doesn't hear about that scheduled test

## 2018-08-27 NOTE — Assessment & Plan Note (Signed)
Continue current med, try to limit salt

## 2018-08-27 NOTE — Assessment & Plan Note (Signed)
Avoid NSAIDs, try to increase water intake; check Cr and GFR today

## 2018-08-27 NOTE — Assessment & Plan Note (Signed)
Followed by ENT 

## 2018-08-27 NOTE — Assessment & Plan Note (Signed)
Managed by endo; practice good fall precautions

## 2018-08-27 NOTE — Assessment & Plan Note (Signed)
Continue plavix; patient to get carotid US through cardiologist; if she has not heard from him in one week, she is to call me

## 2018-08-27 NOTE — Progress Notes (Signed)
BP 126/80   Pulse 81   Temp 97.8 F (36.6 C) (Oral)   Ht '5\' 4"'$  (1.626 m)   Wt 118 lb 1.6 oz (53.6 kg)   SpO2 93%   BMI 20.27 kg/m    Subjective:    Patient ID: Samantha Dawson, female    DOB: 03-04-1929, 85 y.o.   MRN: [id number]  HPI: Samantha Dawson is a 84 y.o. female  Chief Complaint  Patient presents with  . Follow-up    HPI Patient here for f/u Main reason is pain medicines She has chronic pain; took a pain pill this morning and is feeling controlled Most pain is in the back; pain is achy, dull; hard to explain No constipation Medicine does not make her feel drunk or loopy Wishes to continue This medicine improves her quality of life  She saw Dr. Humphrey Rolls, she does NOT smoke; she does not want to walk on a treadmill, she cannot walk on the treadmill because of her back; he also ordered echo and carotid US for carotid athero; status post B CEA years ago  Endo visit on Sept 30th; osteoporosis; DEXA on Sept 9th Left femoral neck -4 T score; she is aware of risk of fracture and minimizing falls; one story living Getting calcium, "they told me to double up on my calcium" She is supposed to start Prolia, and the Rx was sent by endo; patient is thinking about it  ENT is managing thyroid, [place] on Oct 18th IMPRESSION: 1. Stable bilateral thyroid nodules. 2. Left thyroid nodule continues to meet criteria for follow-up. Left thyroid nodule has minimally changed since 2017 and recommend 1-2 year follow-up until there is 5 years of stability demonstrated. 3. No new thyroid nodules.  The above is in keeping with the ACR TI-RADS recommendations - J Am Coll Radiol 2017;14:587-595.   Electronically Signed   By: Markus Daft M.D.   On: 08/23/2018 14:37  Heart burn; no longer taking ranitidine  HTN; needs refills of ARB; trying to stay away from salt; on low dose ARB and beta-blocker  CKD stage 3; not taking NSAIDs; not really a good water drinker  Prediabetes; last A1c  5.8 in April; some dry mouth; sometimes urinates at night, other nights just once; depends if drinking twa or coffee before bed  High cholesterol; last LDL 80; some fatty meats, bacon, cheese  Depression screen [facility] 2/9 08/27/2018 05/29/2018 02/27/2018 11/29/2017 08/29/2017  Decreased Interest 0 0 0 0 0  Down, Depressed, Hopeless 0 0 0 0 0  PHQ - 2 Score 0 0 0 0 0  Altered sleeping 0 - - - -  Tired, decreased energy 0 - - - -  Change in appetite 0 - - - -  Feeling bad or failure about yourself  0 - - - -  Trouble concentrating 0 - - - -  Moving slowly or fidgety/restless 0 - - - -  Suicidal thoughts 0 - - - -  PHQ-9 Score 0 - - - -  Difficult doing work/chores Not difficult at all - - - -   Fall Risk  08/27/2018 05/29/2018 03/26/2018 02/27/2018 11/29/2017  Falls in the past year? No No Yes No Yes  Number falls in past yr: - - 1 - 1  Injury with Fall? - - Yes - Yes  Comment - - Fractures - -  Risk Factor Category  - - High Fall Risk - -  Risk for fall due to : - - Impaired  vision;Impaired balance/gait - -  Risk for fall due to: Comment - - ambulates with cane, wears eyeglasses, macular degeneration - -  Follow up - - Falls evaluation completed;Education provided;Falls prevention discussed - -    Relevant past medical, surgical, family and social history reviewed Past Medical History:  Diagnosis Date  . Acid reflux 10/15/2016  . Anxiety   . Controlled substance agreement signed 03/22/2016  . 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 Encounter for chronic pain management   . Hx of smoking 04/17/2016  . Hyperlipidemia   . Hypertension   . Osteoarthritis   . Osteoporosis 03/22/2016  . Primary osteoarthritis of both hands 05/03/2016   Past Surgical History:  Procedure Laterality Date  . ABDOMINAL HYSTERECTOMY    . CATARACT EXTRACTION    . endaryerectomy    . renal stenting     Family History  Problem Relation Age of Onset  . Cancer Mother   . Coronary artery disease Father    . Cerebrovascular Accident Father   . Cancer Sister        breast cancer  . Cancer Brother        stomach  . Heart disease Daughter        heart vavle bypass?  . Stroke Sister   . Lung disease Brother   . Cancer Brother        unknown  . Arthritis Daughter    Social History   Tobacco Use  . Smoking status: Former Smoker    Packs/day: 1.00    Years: 50.00    Pack years: 50.00    Types: Cigarettes    Last attempt to quit: 2017    Years since quitting: 2.8  . Smokeless tobacco: Never Used  . Tobacco comment: smoking cesssation materials not required  Substance Use Topics  . Alcohol use: No    Alcohol/week: 0.0 standard drinks  . Drug use: No     [facility] Visit [facility] 08/27/2018 [facility]  AUDIT-C Score  0      Interim medical history since last visit reviewed. Allergies and medications reviewed  Review of Systems Per HPI unless specifically indicated above     Objective:    BP 126/80   Pulse 81   Temp 97.8 F (36.6 C) (Oral)   Ht '5\' 4"'$  (1.626 m)   Wt 118 lb 1.6 oz (53.6 kg)   SpO2 93%   BMI 20.27 kg/m   Wt Readings from Last 3 Encounters:  08/27/18 118 lb 1.6 oz (53.6 kg)  05/29/18 113 lb 11.2 oz (51.6 kg)  03/26/18 110 lb 6.4 oz (50.1 kg)    Physical Exam  Constitutional: She appears well-developed and well-nourished. No distress.  HENT:  Head: Normocephalic and atraumatic.  Eyes: EOM are normal. No scleral icterus.  Neck: Carotid bruit is present (RIGHT >> LEFT). No thyromegaly present.  Surgical scars from previous endoarterectomy  Cardiovascular: Normal rate, regular rhythm and normal heart sounds.  No murmur heard. Pulmonary/Chest: Effort normal and breath sounds normal. No respiratory distress. She has no wheezes.  Abdominal: Soft. Bowel sounds are normal. She exhibits no distension.  Musculoskeletal: She exhibits no edema.  Neurological: She is alert.  Skin: Skin is warm and dry. She is not diaphoretic. No pallor.    Psychiatric: She has a normal mood and affect. Her behavior is normal. Judgment and thought content normal.    Results for orders placed or performed in visit on 02/27/18  Urine Drug Screen w/Alc,  no confirm  Result Value Ref Range   Please note     AMPHETAMINES (1000 ng/mL SCRN) NEGATIVE    BARBITURATES NEGATIVE    BENZODIAZEPINES NEGATIVE    COCAINE METABOLITES NEGATIVE    MARIJUANA MET (50 ng/mL SCRN) NEGATIVE    METHADONE NEGATIVE    METHAQUALONE NEGATIVE    OPIATES POSITIVE (A)    PHENCYCLIDINE NEGATIVE    PROPOXYPHENE NEGATIVE    ALCOHOL, ETHYL (U) NEGATIVE   CBC with Differential/Platelet  Result Value Ref Range   WBC 5.4 3.8 - 10.8 Thousand/uL   RBC 4.16 3.80 - 5.10 Million/uL   Hemoglobin 13.2 11.7 - 15.5 g/dL   HCT 39.3 35.0 - 45.0 %   MCV 94.5 80.0 - 100.0 fL   MCH 31.7 27.0 - 33.0 pg   MCHC 33.6 32.0 - 36.0 g/dL   RDW 13.0 11.0 - 15.0 %   Platelets 206 140 - 400 Thousand/uL   MPV 10.4 7.5 - 12.5 fL   Neutro Abs 2,522 1,500 - 7,800 cells/uL   Lymphs Abs 1,917 850 - 3,900 cells/uL   WBC mixed population 794 200 - 950 cells/uL   Eosinophils Absolute 130 15 - 500 cells/uL   Basophils Absolute 38 0 - 200 cells/uL   Neutrophils Relative % 46.7 %   Total Lymphocyte 35.5 %   Monocytes Relative 14.7 %   Eosinophils Relative 2.4 %   Basophils Relative 0.7 %  Lipid panel  Result Value Ref Range   Cholesterol 162 <200 mg/dL   HDL 54 >50 mg/dL   Triglycerides 181 (H) <150 mg/dL   LDL Cholesterol (Calc) 80 mg/dL (calc)   Total CHOL/HDL Ratio 3.0 <5.0 (calc)   Non-HDL Cholesterol (Calc) 108 <130 mg/dL (calc)  COMPLETE METABOLIC PANEL WITH GFR  Result Value Ref Range   Glucose, Bld 98 65 - 139 mg/dL   BUN 16 7 - 25 mg/dL   Creat 1.10 (H) 0.60 - 0.88 mg/dL   GFR, Est Non African American 45 (L) > OR = 60 mL/min/1.6m   GFR, Est African American 53 (L) > OR = 60 mL/min/1.723m  BUN/Creatinine Ratio 15 6 - 22 (calc)   Sodium 141 135 - 146 mmol/L   Potassium 4.6  3.5 - 5.3 mmol/L   Chloride 105 98 - 110 mmol/L   CO2 29 20 - 32 mmol/L   Calcium 9.5 8.6 - 10.4 mg/dL   Total Protein 7.3 6.1 - 8.1 g/dL   Albumin 4.3 3.6 - 5.1 g/dL   Globulin 3.0 1.9 - 3.7 g/dL (calc)   AG Ratio 1.4 1.0 - 2.5 (calc)   Total Bilirubin 0.7 0.2 - 1.2 mg/dL   Alkaline phosphatase (APISO) 122 33 - 130 U/L   AST 23 10 - 35 U/L   ALT 15 6 - 29 U/L  Hemoglobin A1c  Result Value Ref Range   Hgb A1c MFr Bld 5.8 (H) <5.7 % of total Hgb   Mean Plasma Glucose 120 (calc)   eAG (mmol/L) 6.6 (calc)  VITAMIN D 25 Hydroxy (Vit-D Deficiency, Fractures)  Result Value Ref Range   Vit D, 25-Hydroxy 38 30 - 100 ng/mL      Assessment & Plan:   Problem List Items Addressed This Visit      Cardiovascular and Mediastinum   Essential hypertension (Chronic)    Continue current med, try to limit salt      Relevant Medications   losartan (COZAAR) 25 MG tablet   Carotid atherosclerosis, bilateral    With  bruit; cardiologist has ordered scan; patient to contact me if she doesn't hear about that scheduled test      Relevant Medications   losartan (COZAAR) 25 MG tablet   Other Relevant Orders   Lipid panel     Endocrine   Left thyroid nodule    Followed by ENT        Musculoskeletal and Integument   Osteoporosis (Chronic)    Managed by endo; practice good fall precautions      Degenerative disc disease, thoracic (Chronic)    continnue opiate therapy; avoiding NSAIDs because of kidneys      Relevant Medications   HYDROcodone-acetaminophen (NORCO) 10-325 MG tablet   HYDROcodone-acetaminophen (NORCO) 10-325 MG tablet   HYDROcodone-acetaminophen (NORCO) 10-325 MG tablet     Genitourinary   CKD (chronic kidney disease) stage 3, GFR 30-59 ml/min (HCC)    Avoid NSAIDs, try to increase water intake; check Cr and GFR today        Other   Medication monitoring encounter   Relevant Orders   CBC with Differential/Platelet   COMPLETE METABOLIC PANEL WITH GFR    Hyperlipidemia (Chronic)    Check lipids today; goal LDL is less than 70      Relevant Medications   losartan (COZAAR) 25 MG tablet   Other Relevant Orders   Lipid panel   Chronic pain - Primary    Managed here; patient wishes to continue opiate therapy; no adverse side effects; she believes this improves her QOL Reviewed PMP Aware and no red flags      Relevant Medications   HYDROcodone-acetaminophen (NORCO) 10-325 MG tablet   HYDROcodone-acetaminophen (NORCO) 10-325 MG tablet   HYDROcodone-acetaminophen (NORCO) 10-325 MG tablet   Bilateral carotid bruits    Continue plavix; patient to get carotid US through cardiologist; if she has not heard from him in one week, she is to call me      Relevant Orders   Lipid panel    Other Visit Diagnoses    Prediabetes       Relevant Orders   Hemoglobin A1c   Need for influenza vaccination       Relevant Orders   Flu vaccine HIGH DOSE PF (Fluzone High dose) (Completed)       Follow up plan: Return in about 3 months (around 11/27/2018) for follow-up visit with Dr. Sanda Klein (3 months or just AFTER).  An after-visit summary was printed and given to the patient at [place].  Please see the patient instructions which may contain other information and recommendations beyond what is mentioned above in the assessment and plan.  Meds ordered this encounter  Medications  . clopidogrel (PLAVIX) 75 MG tablet    Sig: Take 1 tablet (75 mg total) by mouth daily.    Dispense:  30 tablet    Refill:  11  . DISCONTD: HYDROcodone-acetaminophen (NORCO) 10-325 MG tablet    Sig: Take 1 tablet by mouth every 6 (six) hours as needed.    Dispense:  105 tablet    Refill:  0    Rx #3 fill on or after Oct 27, 2018  . DISCONTD: HYDROcodone-acetaminophen (NORCO) 10-325 MG tablet    Sig: Take 1 tablet by mouth every 6 (six) hours as needed.    Dispense:  105 tablet    Refill:  0    Rx #2 fill on or after Sep 27, 2018  . DISCONTD: HYDROcodone-acetaminophen  (NORCO) 10-325 MG tablet    Sig: Take 1 tablet by mouth every  6 (six) hours as needed.    Dispense:  105 tablet    Refill:  0    Fill 30 days after last fill at your pharmacy  . HYDROcodone-acetaminophen (NORCO) 10-325 MG tablet    Sig: Take 1 tablet by mouth every 6 (six) hours as needed.    Dispense:  105 tablet    Refill:  0    Rx #3 fill on or after Oct 27, 2018  . HYDROcodone-acetaminophen (NORCO) 10-325 MG tablet    Sig: Take 1 tablet by mouth every 6 (six) hours as needed.    Dispense:  105 tablet    Refill:  0    Rx #2 fill on or after Sep 27, 2018  . HYDROcodone-acetaminophen (NORCO) 10-325 MG tablet    Sig: Take 1 tablet by mouth every 6 (six) hours as needed.    Dispense:  105 tablet    Refill:  0    Fill 30 days after last fill at your pharmacy  . losartan (COZAAR) 25 MG tablet    Sig: Take 0.5 tablets (12.5 mg total) by mouth daily.    Dispense:  15 tablet    Refill:  5    We're decreasing the dose a little; use this RX    Orders Placed This Encounter  Procedures  . Flu vaccine HIGH DOSE PF (Fluzone High dose)  . CBC with Differential/Platelet  . COMPLETE METABOLIC PANEL WITH GFR  . Hemoglobin A1c  . Lipid panel

## 2018-08-28 ENCOUNTER — Other Ambulatory Visit: Payer: Self-pay | Admitting: Family Medicine

## 2018-08-28 LAB — COMPLETE METABOLIC PANEL WITH GFR
AG RATIO: 1.5 (calc) (ref 1.0–2.5)
ALKALINE PHOSPHATASE (APISO): 75 U/L (ref 33–130)
ALT: 17 U/L (ref 6–29)
AST: 25 U/L (ref 10–35)
Albumin: 4.3 g/dL (ref 3.6–5.1)
BUN/Creatinine Ratio: 15 (calc) (ref 6–22)
BUN: 18 mg/dL (ref 7–25)
CHLORIDE: 104 mmol/L (ref 98–110)
CO2: 28 mmol/L (ref 20–32)
Calcium: 9.4 mg/dL (ref 8.6–10.4)
Creat: 1.2 mg/dL — ABNORMAL HIGH (ref 0.60–0.88)
GFR, Est African American: 47 mL/min/{1.73_m2} — ABNORMAL LOW (ref >=60)
GFR, Est Non African American: 41 mL/min/{1.73_m2} — ABNORMAL LOW (ref >=60)
GLOBULIN: 2.8 g/dL (ref 1.9–3.7)
Glucose, Bld: 96 mg/dL (ref 65–139)
POTASSIUM: 4.4 mmol/L (ref 3.5–5.3)
SODIUM: 141 mmol/L (ref 135–146)
Total Bilirubin: 0.7 mg/dL (ref 0.2–1.2)
Total Protein: 7.1 g/dL (ref 6.1–8.1)

## 2018-08-28 LAB — CBC WITH DIFFERENTIAL/PLATELET
BASOS PCT: 0.9 %
Basophils Absolute: 52 cells/uL (ref 0–200)
EOS ABS: 191 {cells}/uL (ref 15–500)
Eosinophils Relative: 3.3 %
HEMATOCRIT: 38.2 % (ref 35.0–45.0)
HEMOGLOBIN: 12.8 g/dL (ref 11.7–15.5)
Lymphs Abs: 1926 cells/uL (ref 850–3900)
MCH: 32.2 pg (ref 27.0–33.0)
MCHC: 33.5 g/dL (ref 32.0–36.0)
MCV: 96 fL (ref 80.0–100.0)
MONOS PCT: 10.9 %
MPV: 10.2 fL (ref 7.5–12.5)
NEUTROS ABS: 2999 {cells}/uL (ref 1500–7800)
Neutrophils Relative %: 51.7 %
Platelets: 189 10*3/uL (ref 140–400)
RBC: 3.98 10*6/uL (ref 3.80–5.10)
RDW: 12.1 % (ref 11.0–15.0)
TOTAL LYMPHOCYTE: 33.2 %
WBC mixed population: 632 cells/uL (ref 200–950)
WBC: 5.8 10*3/uL (ref 3.8–10.8)

## 2018-08-28 LAB — HEMOGLOBIN A1C
HEMOGLOBIN A1C: 5.8 %{Hb} — AB (ref <5.7)
Mean Plasma Glucose: 120 (calc)
eAG (mmol/L): 6.6 (calc)

## 2018-08-28 LAB — LIPID PANEL
Cholesterol: 160 mg/dL (ref <200)
HDL: 55 mg/dL (ref >50)
LDL Cholesterol (Calc): 74 mg/dL (calc)
NON-HDL CHOLESTEROL (CALC): 105 mg/dL (ref <130)
Total CHOL/HDL Ratio: 2.9 (calc) (ref <5.0)
Triglycerides: 213 mg/dL — ABNORMAL HIGH (ref <150)

## 2018-08-29 NOTE — Telephone Encounter (Signed)
Pt notified that this would not be refilled. Stated that she is using the [employer] everyday and per Dr. Sanda Klein if this occurring then patient needs to make an appt with Dr. Alva Garnet as this would mean her COPD is uncontrolled. Pt notified and stated that she would discuss with Dr. Sanda Klein at her next appt.

## 2018-08-29 NOTE — Telephone Encounter (Signed)
If patient has been through three rescue inhalers in the last three months, she needs to contact Dr. Alva Garnet as this means her COPD is uncontrolled

## 2018-08-30 MED ORDER — ALBUTEROL SULFATE HFA 108 (90 BASE) MCG/ACT IN AERS: 2.0000 | INHALATION_SPRAY | RESPIRATORY_TRACT | 0 refills | Status: DC | PRN

## 2018-08-30 NOTE — Addendum Note (Signed)
Addended by: Jaynie Hitch, Satira Anis on: 08/30/2018 12:57 PM   Modules accepted: Orders

## 2018-08-30 NOTE — Telephone Encounter (Signed)
I did not intend for this to be denied, just that she needs to contact her lung doctor if going through a rescue inhaler every single month I will refill this one more time and ask that she see her lung doctor and will send him a copy of our communication

## 2018-09-13 DIAGNOSIS — I2510 Atherosclerotic heart disease of native coronary artery without angina pectoris: Secondary | ICD-10-CM | POA: Diagnosis not present

## 2018-09-13 DIAGNOSIS — I10 Essential (primary) hypertension: Secondary | ICD-10-CM | POA: Diagnosis not present

## 2018-09-13 DIAGNOSIS — I739 Peripheral vascular disease, unspecified: Secondary | ICD-10-CM | POA: Diagnosis not present

## 2018-09-13 DIAGNOSIS — R0602 Shortness of breath: Secondary | ICD-10-CM | POA: Diagnosis not present

## 2018-09-13 DIAGNOSIS — I340 Nonrheumatic mitral (valve) insufficiency: Secondary | ICD-10-CM | POA: Diagnosis not present

## 2018-09-26 DIAGNOSIS — I10 Essential (primary) hypertension: Secondary | ICD-10-CM | POA: Diagnosis not present

## 2018-09-26 DIAGNOSIS — I6522 Occlusion and stenosis of left carotid artery: Secondary | ICD-10-CM | POA: Diagnosis not present

## 2018-09-26 DIAGNOSIS — R0602 Shortness of breath: Secondary | ICD-10-CM | POA: Diagnosis not present

## 2018-09-26 DIAGNOSIS — I2510 Atherosclerotic heart disease of native coronary artery without angina pectoris: Secondary | ICD-10-CM | POA: Diagnosis not present

## 2018-09-26 DIAGNOSIS — I739 Peripheral vascular disease, unspecified: Secondary | ICD-10-CM | POA: Diagnosis not present

## 2018-10-07 DIAGNOSIS — I10 Essential (primary) hypertension: Secondary | ICD-10-CM | POA: Diagnosis not present

## 2018-10-07 DIAGNOSIS — I2510 Atherosclerotic heart disease of native coronary artery without angina pectoris: Secondary | ICD-10-CM | POA: Diagnosis not present

## 2018-10-07 DIAGNOSIS — I340 Nonrheumatic mitral (valve) insufficiency: Secondary | ICD-10-CM | POA: Diagnosis not present

## 2018-10-11 ENCOUNTER — Encounter: Payer: Self-pay | Admitting: Family Medicine

## 2018-10-29 ENCOUNTER — Telehealth: Payer: Self-pay | Admitting: Family Medicine

## 2018-10-29 DIAGNOSIS — M5134 Other intervertebral disc degeneration, thoracic region: Secondary | ICD-10-CM

## 2018-10-29 NOTE — Telephone Encounter (Signed)
Rx request for control substance- sent for provider review

## 2018-10-29 NOTE — Telephone Encounter (Signed)
Copied from [place] [id number]. Topic: Quick Communication - Rx Refill/Question >> Oct 29, 2018  1:58 PM Windy Kalata wrote: Medication: HYDROcodone-acetaminophen ([place]) 10-325 MG tablet     Has the patient contacted their pharmacy? Yes.   (Agent: If no, request that the patient contact the pharmacy for the refill.) (Agent: If yes, when and what did the pharmacy advise?)  Preferred Pharmacy (with phone number or street name): HYDROcodone-acetaminophen (NORCO) 10-325 MG tablet     Agent: Please be advised that RX refills may take up to 3 business days. We ask that you follow-up with your pharmacy.

## 2018-10-31 ENCOUNTER — Telehealth: Payer: Self-pay | Admitting: Family Medicine

## 2018-10-31 MED ORDER — HYDROCODONE-ACETAMINOPHEN 10-325 MG PO TABS: 1.0000 | ORAL_TABLET | Freq: Four times a day (QID) | ORAL | 0 refills | Status: DC | PRN

## 2018-10-31 NOTE — Telephone Encounter (Signed)
Left detailed VM.  

## 2018-10-31 NOTE — Telephone Encounter (Signed)
She should not need a refill Please see that one of the prescriptions already written for hydrocodone says "Rx #3 fill on or after Oct 27, 2018" Have her call her pharmacy

## 2018-10-31 NOTE — Telephone Encounter (Signed)
Copied from [place] [id number]. Topic: Conservator, museum/gallery Patient (Clinic Use ONLY) >> Oct 31, 2018  9:12 AM Sibyl Parr, CMA wrote: Reason for CRM:   Norco refill refused.  Per Dr. Sanda Klein- She should not need a refill Please see that one of the prescriptions already written for hydrocodone says "Rx #3 fill on or after Oct 27, 2018" Have her call her pharmacy >> Oct 31, 2018 12:12 PM Bea Graff, NT wrote: Pt states that [facility] is where she is needing the med filled at and they are unable to pull the med from the [facility] rx due to its a narcotic. States the dr will have to send the rx to [facility]. Please advise. Pt requesting call back.

## 2018-10-31 NOTE — Telephone Encounter (Addendum)
Pt said [facility] is closed and they did not send hydrocodone rx to [facility] on [address]

## 2018-10-31 NOTE — Addendum Note (Signed)
Addended by: Nayanna Seaborn, Satira Anis on: 10/31/2018 12:33 PM   Modules accepted: Orders

## 2018-10-31 NOTE — Telephone Encounter (Signed)
I sent this already today

## 2018-11-25 ENCOUNTER — Encounter: Payer: Self-pay | Admitting: Family Medicine

## 2018-11-25 ENCOUNTER — Ambulatory Visit (INDEPENDENT_AMBULATORY_CARE_PROVIDER_SITE_OTHER): Payer: Medicare Other | Admitting: Family Medicine

## 2018-11-25 VITALS — BP 126/74 | HR 79 | Temp 97.9°F | Ht 60.0 in | Wt 108.5 lb

## 2018-11-25 DIAGNOSIS — I6523 Occlusion and stenosis of bilateral carotid arteries: Secondary | ICD-10-CM | POA: Diagnosis not present

## 2018-11-25 DIAGNOSIS — I10 Essential (primary) hypertension: Secondary | ICD-10-CM

## 2018-11-25 DIAGNOSIS — Z79899 Other long term (current) drug therapy: Secondary | ICD-10-CM | POA: Diagnosis not present

## 2018-11-25 DIAGNOSIS — G894 Chronic pain syndrome: Secondary | ICD-10-CM

## 2018-11-25 DIAGNOSIS — M5134 Other intervertebral disc degeneration, thoracic region: Secondary | ICD-10-CM | POA: Diagnosis not present

## 2018-11-25 DIAGNOSIS — Z5181 Encounter for therapeutic drug level monitoring: Secondary | ICD-10-CM | POA: Diagnosis not present

## 2018-11-25 MED ORDER — GLYCOPYRROLATE-FORMOTEROL 9-4.8 MCG/ACT IN AERO: 2.0000 | INHALATION_SPRAY | Freq: Two times a day (BID) | RESPIRATORY_TRACT | 11 refills | Status: DC

## 2018-11-25 MED ORDER — HYDROCODONE-ACETAMINOPHEN 10-325 MG PO TABS: 1.0000 | ORAL_TABLET | Freq: Four times a day (QID) | ORAL | 0 refills | Status: DC | PRN

## 2018-11-25 MED ORDER — ALBUTEROL SULFATE HFA 108 (90 BASE) MCG/ACT IN AERS: 2.0000 | INHALATION_SPRAY | RESPIRATORY_TRACT | 0 refills | Status: DC | PRN

## 2018-11-25 NOTE — Progress Notes (Signed)
BP 126/74   Pulse 79   Temp 97.9 F (36.6 C) (Oral)   Ht 5' (1.524 m)   Wt 108 lb 8 oz (49.2 kg)   SpO2 93%   BMI 21.19 kg/m    Subjective:    Patient ID: Samantha Dawson, female    DOB: 29-Aug-1929, 85 y.o.   MRN: [id number]  HPI: Samantha Dawson is a 85 y.o. female  Chief Complaint  Patient presents with  . Follow-up    HPI Here for f/u She is here about her pain medicine She says it is hard to explain; goes across her shoulders, down her spine (pointing over her right shoudler), then reaches across the lower back; hips and across the lower back; all the same locations Quality has not changed Pain medicine helps control the pain; pain medicine helps her to function No constipation or obstipation; moving bowels at least every other day; no blood in the stool Medicine does not make her loopy or goofy or drunk No alcohol at all Last Rx filled on 10/31/2018  Osteoporosis; last DEXA was Sept 2019; seeing endocrinologist for this; they ordered Prolia to be given twice a year; patient has not received her first injection; just has not done it; she has their number; she just hasn't thought about it, just forgot  She saw Dr. Humphrey Rolls and had a CT scan done, carotid was blocked up and he said he did not recommend surgery at all; Sep 26, 2018; complete occlusion left internal carotid; he increase crestor to 10 mg daily  She has emphysema; she needs the inhaler; asking for Proair; using it a lot; not using Bevespi; reviewed Dr. Alva Garnet pulmonologist note from 2018; she does not necessarily want to go back at this time  Depression screen [facility] 2/9 11/25/2018 08/27/2018 05/29/2018 02/27/2018 11/29/2017  Decreased Interest 0 0 0 0 0  Down, Depressed, Hopeless 0 0 0 0 0  PHQ - 2 Score 0 0 0 0 0  Altered sleeping 0 0 - - -  Tired, decreased energy 0 0 - - -  Change in appetite 0 0 - - -  Feeling bad or failure about yourself  0 0 - - -  Trouble concentrating 0 0 - - -  Moving slowly or  fidgety/restless 0 0 - - -  Suicidal thoughts 0 0 - - -  PHQ-9 Score 0 0 - - -  Difficult doing work/chores Not difficult at all Not difficult at all - - -   Fall Risk  11/25/2018 08/27/2018 05/29/2018 03/26/2018 02/27/2018  Falls in the past year? 0 No No Yes No  Number falls in past yr: 0 - - 1 -  Injury with Fall? 0 - - Yes -  Comment - - - Fractures -  Risk Factor Category  - - - High Fall Risk -  Risk for fall due to : - - - Impaired vision;Impaired balance/gait -  Risk for fall due to: Comment - - - ambulates with cane, wears eyeglasses, macular degeneration -  Follow up - - - Falls evaluation completed;Education provided;Falls prevention discussed -    Relevant past medical, surgical, family and social history reviewed Past Medical History:  Diagnosis Date  . Acid reflux 10/15/2016  . Anxiety   . Controlled substance agreement signed 03/22/2016  . 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 Encounter for chronic pain management   . Hx of smoking 04/17/2016  . Hyperlipidemia   . Hypertension   . Osteoarthritis   .  Osteoporosis 03/22/2016  . Primary osteoarthritis of both hands 05/03/2016   Past Surgical History:  Procedure Laterality Date  . ABDOMINAL HYSTERECTOMY    . CATARACT EXTRACTION    . endaryerectomy    . renal stenting     Family History  Problem Relation Age of Onset  . Cancer Mother   . Coronary artery disease Father   . Cerebrovascular Accident Father   . Cancer Sister        breast cancer  . Cancer Brother        stomach  . Heart disease Daughter        heart vavle bypass?  . Stroke Sister   . Lung disease Brother   . Cancer Brother        unknown  . Arthritis Daughter    Social History   Tobacco Use  . Smoking status: Former Smoker    Packs/day: 1.00    Years: 50.00    Pack years: 50.00    Types: Cigarettes    Last attempt to quit: 2017    Years since quitting: 3.0  . Smokeless tobacco: Never Used  . Tobacco comment: smoking  cesssation materials not required  Substance Use Topics  . Alcohol use: No    Alcohol/week: 0.0 standard drinks  . Drug use: No     Office Visit from 11/25/2018 in [facility]  AUDIT-C Score  0      Interim medical history since last visit reviewed. Allergies and medications reviewed  Review of Systems Per HPI unless specifically indicated above     Objective:    BP 126/74   Pulse 79   Temp 97.9 F (36.6 C) (Oral)   Ht 5' (1.524 m)   Wt 108 lb 8 oz (49.2 kg)   SpO2 93%   BMI 21.19 kg/m   Wt Readings from Last 3 Encounters:  11/25/18 108 lb 8 oz (49.2 kg)  08/27/18 118 lb 1.6 oz (53.6 kg)  05/29/18 113 lb 11.2 oz (51.6 kg)    Physical Exam Constitutional:      General: She is not in acute distress.    Appearance: She is well-developed. She is not diaphoretic.     Comments: Weight down, but does not appear cachetic or malnourished  HENT:     Head: Normocephalic and atraumatic.  Eyes:     General: No scleral icterus. Neck:     Thyroid: No thyromegaly.  Cardiovascular:     Rate and Rhythm: Normal rate and regular rhythm.     Heart sounds: Normal heart sounds. No murmur.  Pulmonary:     Effort: Pulmonary effort is normal. No respiratory distress.     Breath sounds: Normal breath sounds. No wheezing.  Abdominal:     General: Bowel sounds are normal. There is no distension.     Palpations: Abdomen is soft.  Musculoskeletal:     Thoracic back: She exhibits tenderness and deformity (kyphosis).  Skin:    General: Skin is warm and dry.     Coloration: Skin is not pale.  Neurological:     Mental Status: She is alert.  Psychiatric:        Behavior: Behavior normal.        Thought Content: Thought content normal.        Judgment: Judgment normal.       Assessment & Plan:   Problem List Items Addressed This Visit      Cardiovascular and Mediastinum   Essential hypertension (Chronic)  Well-controlled today      Relevant Medications    rosuvastatin (CRESTOR) 10 MG tablet   Carotid atherosclerosis, bilateral    Complete occlusion of left internal carotid; monitored by Dr. Humphrey Rolls, he has adjusted crestor to higher dose; sees him in Feb      Relevant Medications   rosuvastatin (CRESTOR) 10 MG tablet     Musculoskeletal and Integument   Degenerative disc disease, thoracic (Chronic)    Pain medicine is hleping patient to function; no adverse reactions to her knowledge; she wishes to continue the medicine      Relevant Medications   HYDROcodone-acetaminophen (NORCO) 10-325 MG tablet     Other   Medication monitoring encounter   Relevant Orders   Urine Drug Screen w/Alc, no confirm (Completed)   Controlled substance agreement signed    Renewed today, Nov 25, 2018      Chronic pain - Primary    Managed here, no red flags, continue f/u every 3 months      Relevant Medications   HYDROcodone-acetaminophen (NORCO) 10-325 MG tablet   Other Relevant Orders   Urine Drug Screen w/Alc, no confirm (Completed)       Follow up plan: Return in about 3 months (around 02/24/2019).  An after-visit summary was printed and given to the patient at [place].  Please see the patient instructions which may contain other information and recommendations beyond what is mentioned above in the assessment and plan.  Meds ordered this encounter  Medications  . HYDROcodone-acetaminophen (NORCO) 10-325 MG tablet    Sig: Take 1 tablet by mouth every 6 (six) hours as needed.    Dispense:  105 tablet    Refill:  0    May fill on or after November 30, 2018; on chronic pain agreement with Dr. Sanda Klein; reviewed PMP Aware today  . DISCONTD: Glycopyrrolate-Formoterol (BEVESPI AEROSPHERE) 9-4.8 MCG/ACT AERO    Sig: Take 2 puffs by mouth 2 (two) times daily.    Dispense:  1 Inhaler    Refill:  11  . albuterol (PROAIR HFA) 108 (90 Base) MCG/ACT inhaler    Sig: Inhale 2 puffs into the lungs every 4 (four) hours as needed for wheezing or shortness of  breath. Future refills from lung doctor    Dispense:  1 Inhaler    Refill:  0  . Glycopyrrolate-Formoterol (BEVESPI AEROSPHERE) 9-4.8 MCG/ACT AERO    Sig: Take 2 puffs by mouth 2 (two) times daily.    Dispense:  1 Inhaler    Refill:  11    Orders Placed This Encounter  Procedures  . Urine Drug Screen w/Alc, no confirm

## 2018-11-25 NOTE — Patient Instructions (Addendum)
Please do contact [facility] at the [facility] to discuss the [place] good fall precautions Start back on Bevespi Use your rescue inhaler only when needed Return in 3 months

## 2018-11-25 NOTE — Assessment & Plan Note (Signed)
Renewed today, Nov 25, 2018

## 2018-11-25 NOTE — Assessment & Plan Note (Signed)
Pain medicine is hleping patient to function; no adverse reactions to her knowledge; she wishes to continue the medicine

## 2018-11-25 NOTE — Assessment & Plan Note (Signed)
Well controlled today.

## 2018-11-25 NOTE — Assessment & Plan Note (Signed)
Managed here, no red flags, continue f/u every 3 months

## 2018-11-25 NOTE — Assessment & Plan Note (Signed)
Complete occlusion of left internal carotid; monitored by Dr. Humphrey Rolls, he has adjusted crestor to higher dose; sees him in Feb

## 2018-11-26 LAB — DRUG SCREEN URINE W/ALC, NO CONF
ALCOHOL, ETHYL (U): NEGATIVE
AMPHETAMINES (1000 NG/ML SCRN): NEGATIVE
BARBITURATES: NEGATIVE
BENZODIAZEPINES: NEGATIVE
COCAINE METABOLITES: NEGATIVE
MARIJUANA MET (50 ng/mL SCRN): NEGATIVE
METHADONE: NEGATIVE
METHAQUALONE: NEGATIVE
OPIATES: POSITIVE — AB
PHENCYCLIDINE: NEGATIVE
PROPOXYPHENE: NEGATIVE

## 2018-12-23 DIAGNOSIS — I10 Essential (primary) hypertension: Secondary | ICD-10-CM | POA: Diagnosis not present

## 2018-12-23 DIAGNOSIS — I739 Peripheral vascular disease, unspecified: Secondary | ICD-10-CM | POA: Diagnosis not present

## 2018-12-23 DIAGNOSIS — I2510 Atherosclerotic heart disease of native coronary artery without angina pectoris: Secondary | ICD-10-CM | POA: Diagnosis not present

## 2018-12-23 DIAGNOSIS — R0602 Shortness of breath: Secondary | ICD-10-CM | POA: Diagnosis not present

## 2018-12-30 ENCOUNTER — Other Ambulatory Visit: Payer: Self-pay | Admitting: Family Medicine

## 2018-12-30 DIAGNOSIS — M5134 Other intervertebral disc degeneration, thoracic region: Secondary | ICD-10-CM

## 2018-12-30 NOTE — Telephone Encounter (Signed)
Pt requesting refill on the hydrocodone. Please send to [facility]. She is asking that you send in enough to last until her 4.20.2020 appt. She went to pick it up this morning but they did not have it.

## 2018-12-31 MED ORDER — HYDROCODONE-ACETAMINOPHEN 10-325 MG PO TABS: 1.0000 | ORAL_TABLET | Freq: Four times a day (QID) | ORAL | 0 refills | Status: DC | PRN

## 2018-12-31 NOTE — Telephone Encounter (Signed)
PMP Aware site reviewed Last UDS reviewed Rx approved

## 2019-01-20 ENCOUNTER — Other Ambulatory Visit: Payer: Self-pay | Admitting: Family Medicine

## 2019-01-21 ENCOUNTER — Other Ambulatory Visit: Payer: Self-pay

## 2019-01-21 DIAGNOSIS — I10 Essential (primary) hypertension: Secondary | ICD-10-CM

## 2019-01-21 MED ORDER — LOSARTAN POTASSIUM 25 MG PO TABS: 12.5000 mg | ORAL_TABLET | Freq: Every day | ORAL | 1 refills | Status: DC

## 2019-01-21 NOTE — Telephone Encounter (Signed)
Pt wants 90 day supply

## 2019-01-29 ENCOUNTER — Telehealth: Payer: Self-pay | Admitting: Family Medicine

## 2019-01-29 DIAGNOSIS — M5134 Other intervertebral disc degeneration, thoracic region: Secondary | ICD-10-CM

## 2019-01-29 NOTE — Telephone Encounter (Signed)
Copied from [place] [id number]. Topic: Quick Communication - Rx Refill/Question >> Jan 29, 2019 11:02 AM Ahmed Prima L wrote: Medication: HYDROcodone-acetaminophen (NORCO) 10-325 MG tablet  Has the patient contacted their pharmacy? no (Agent: If no, request that the patient contact the pharmacy for the refill.) (Agent: If yes, when and what did the pharmacy advise?)  Preferred Pharmacy (with phone number or street name): [id number] [address], [place] [address] [place] [address] Phone: [phone] Fax: [phone]    Agent: Please be advised that RX refills may take up to 3 business days. We ask that you follow-up with your pharmacy.

## 2019-01-30 MED ORDER — HYDROCODONE-ACETAMINOPHEN 10-325 MG PO TABS: 1.0000 | ORAL_TABLET | Freq: Four times a day (QID) | ORAL | 0 refills | Status: DC | PRN

## 2019-01-30 NOTE — Telephone Encounter (Signed)
PMP aware checked, no suspicious activity. One month supply provided.

## 2019-01-30 NOTE — Telephone Encounter (Signed)
Patient is check on her medication refill. Patient was advised that refills can take up to 3 business days.  Thank you

## 2019-01-31 NOTE — Telephone Encounter (Signed)
Pt.notified

## 2019-02-24 ENCOUNTER — Encounter: Payer: Self-pay | Admitting: Family Medicine

## 2019-02-24 ENCOUNTER — Ambulatory Visit (INDEPENDENT_AMBULATORY_CARE_PROVIDER_SITE_OTHER): Payer: Medicare Other | Admitting: Family Medicine

## 2019-02-24 ENCOUNTER — Other Ambulatory Visit: Payer: Self-pay

## 2019-02-24 DIAGNOSIS — G894 Chronic pain syndrome: Secondary | ICD-10-CM | POA: Diagnosis not present

## 2019-02-24 DIAGNOSIS — E041 Nontoxic single thyroid nodule: Secondary | ICD-10-CM

## 2019-02-24 DIAGNOSIS — I10 Essential (primary) hypertension: Secondary | ICD-10-CM | POA: Diagnosis not present

## 2019-02-24 DIAGNOSIS — M5134 Other intervertebral disc degeneration, thoracic region: Secondary | ICD-10-CM | POA: Diagnosis not present

## 2019-02-24 DIAGNOSIS — J431 Panlobular emphysema: Secondary | ICD-10-CM | POA: Diagnosis not present

## 2019-02-24 MED ORDER — HYDROCODONE-ACETAMINOPHEN 10-325 MG PO TABS: 1.0000 | ORAL_TABLET | Freq: Four times a day (QID) | ORAL | 0 refills | Status: DC | PRN

## 2019-02-24 NOTE — Assessment & Plan Note (Signed)
Continue medicine; limit salt intake

## 2019-02-24 NOTE — Progress Notes (Signed)
There were no vitals taken for this visit.   Subjective:    Patient ID: Samantha Dawson, female    DOB: November 08, 1928, 85 y.o.   MRN: [id number]  HPI: Samantha Dawson is a 85 y.o. female  Chief Complaint  Patient presents with  . Follow-up    HPI Virtual Visit via Telephone/Video Note   I connected with the patient via:  telephone I verified that I am speaking with the correct person using two identifiers.  Call started: 12:15 pm Call terminated: 12:34 pm Total length of call: 18 minutes 37 seconds   I discussed the limitations, risks, and privacy concerns of performing an evaluation and management service by telephone and the availability of in-person appointments. I explained that he/she may be responsible for charges related to this service. The patient expressed understanding and agreed to proceed.  Provider location: home, upstairs office with door closed, earphones/headset on Patient location: home Additional participants: daughter is there, going out soon; Karrie Meres  She has been doing "pretty good" except for going stir crazy No known exposures to COVID-19 patients, no sx  Chronic pain from arthritis; "it's about the same" except some worsening when the weather is damp No problems with her medicine; the medicine does not make her loopy or drunk No problems with constipation; she is staying on top of that Everything seems to be going well She keeps her medicine locked in a safe She filled her medicine on January 30, 2019  COPD She does have her inhalers and uses them when short of breath, chronic, nothing new; exertional She does not have a lung doctor; she wants to wait until the virus thing is over; no supplemental oxygen Daughter smokes outside, not in the house She asked for a Proair inhaler; not using every day even; just got Rx for SABA and still has refill  HTN; she has tried to check it but machine not working; she'll get a new one when she get to the  store again; no headaches or chest pain  Thyroid nodule; managed by ENT, Dr. Richardson Landry  Depression screen [facility] 2/9 11/25/2018 08/27/2018 05/29/2018 02/27/2018 11/29/2017  Decreased Interest 0 0 0 0 0  Down, Depressed, Hopeless 0 0 0 0 0  PHQ - 2 Score 0 0 0 0 0  Altered sleeping 0 0 - - -  Tired, decreased energy 0 0 - - -  Change in appetite 0 0 - - -  Feeling bad or failure about yourself  0 0 - - -  Trouble concentrating 0 0 - - -  Moving slowly or fidgety/restless 0 0 - - -  Suicidal thoughts 0 0 - - -  PHQ-9 Score 0 0 - - -  Difficult doing work/chores Not difficult at all Not difficult at all - - -   Fall Risk  02/24/2019 11/25/2018 08/27/2018 05/29/2018 03/26/2018  Falls in the past year? 0 0 No No Yes  Number falls in past yr: - 0 - - 1  Injury with Fall? - 0 - - Yes  Comment - - - - Fractures  Risk Factor Category  - - - - High Fall Risk  Risk for fall due to : - - - - Impaired vision;Impaired balance/gait  Risk for fall due to: Comment - - - - ambulates with cane, wears eyeglasses, macular degeneration  Follow up - - - - Falls evaluation completed;Education provided;Falls prevention discussed    Relevant past medical, surgical, family and social history reviewed  Past Medical History:  Diagnosis Date  . Acid reflux 10/15/2016  . Anxiety   . Controlled substance agreement signed 03/22/2016  . 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 Encounter for chronic pain management   . Hx of smoking 04/17/2016  . Hyperlipidemia   . Hypertension   . Osteoarthritis   . Osteoporosis 03/22/2016  . Primary osteoarthritis of both hands 05/03/2016   Past Surgical History:  Procedure Laterality Date  . ABDOMINAL HYSTERECTOMY    . CATARACT EXTRACTION    . endaryerectomy    . renal stenting     Family History  Problem Relation Age of Onset  . Cancer Mother   . Coronary artery disease Father   . Cerebrovascular Accident Father   . Cancer Sister        breast cancer  . Cancer  Brother        stomach  . Heart disease Daughter        heart vavle bypass?  . Stroke Sister   . Lung disease Brother   . Cancer Brother        unknown  . Arthritis Daughter    Social History   Tobacco Use  . Smoking status: Former Smoker    Packs/day: 1.00    Years: 50.00    Pack years: 50.00    Types: Cigarettes    Last attempt to quit: 2017    Years since quitting: 3.3  . Smokeless tobacco: Never Used  . Tobacco comment: smoking cesssation materials not required  Substance Use Topics  . Alcohol use: No    Alcohol/week: 0.0 standard drinks  . Drug use: No     Office Visit from 02/24/2019 in [facility]  AUDIT-C Score  0      Interim medical history since last visit reviewed. Allergies and medications reviewed  Review of Systems Per HPI unless specifically indicated above     Objective:    There were no vitals taken for this visit.   Physical Exam HENT:     Right Ear: Decreased hearing noted.     Left Ear: Decreased hearing noted.  Pulmonary:     Effort: No respiratory distress.  Neurological:     Mental Status: She is alert.     Cranial Nerves: No dysarthria.  Psychiatric:        Speech: Speech is not rapid and pressured, delayed or slurred.     Results for orders placed or performed in visit on 11/25/18  Urine Drug Screen w/Alc, no confirm  Result Value Ref Range   Please note     AMPHETAMINES (1000 ng/mL SCRN) NEGATIVE    BARBITURATES NEGATIVE    BENZODIAZEPINES NEGATIVE    COCAINE METABOLITES NEGATIVE    MARIJUANA MET (50 ng/mL SCRN) NEGATIVE    METHADONE NEGATIVE    METHAQUALONE NEGATIVE    OPIATES POSITIVE (A)    PHENCYCLIDINE NEGATIVE    PROPOXYPHENE NEGATIVE    ALCOHOL, ETHYL (U) NEGATIVE       Assessment & Plan:   Problem List Items Addressed This Visit      Cardiovascular and Mediastinum   Essential hypertension (Chronic)    Continue medicine; limit salt intake        Respiratory   Panlobular emphysema  (HCC) (Chronic)    Avoid passive smoke exposure as much as possible; offered referral to pulmonologist but patient politley declined citing COVID-19 outbreak; I advised her that she is at high risk of complications of PNTIR-44 if contracted;  social isolation, hand washing, don't touch face, etc.; continue inhaler        Endocrine   Left thyroid nodule    Managed by ENT, Dr. Richardson Landry she says; they call her for appts when needed she says        Musculoskeletal and Integument   Degenerative disc disease, thoracic (Chronic)   Relevant Medications   HYDROcodone-acetaminophen (NORCO) 10-325 MG tablet   HYDROcodone-acetaminophen (NORCO) 10-325 MG tablet   HYDROcodone-acetaminophen (NORCO) 10-325 MG tablet     Other   Chronic pain    Controlled with current medicines; no red flags after review of the PMP Aware web site; return in 3 months for next visit      Relevant Medications   HYDROcodone-acetaminophen (NORCO) 10-325 MG tablet   HYDROcodone-acetaminophen (NORCO) 10-325 MG tablet   HYDROcodone-acetaminophen (NORCO) 10-325 MG tablet       Follow up plan: Return in about 3 months (around 05/26/2019) for follow-up visit with Dr. Sanda Klein.  An after-visit summary was printed and given to the patient at [facility].  Please see the patient instructions which may contain other information and recommendations beyond what is mentioned above in the assessment and plan.  Meds ordered this encounter  Medications  . HYDROcodone-acetaminophen (NORCO) 10-325 MG tablet    Sig: Take 1 tablet by mouth every 6 (six) hours as needed.    Dispense:  105 tablet    Refill:  0    Rx # THREE fill on or after April 30, 2019; PMP AWARE reviewed 02/24/2019  . HYDROcodone-acetaminophen (NORCO) 10-325 MG tablet    Sig: Take 1 tablet by mouth every 6 (six) hours as needed.    Dispense:  105 tablet    Refill:  0    Rx # TWO on chronic pain agreement with Dr. Sanda Klein; fill on or after Mar 31, 2019; I reviewed PMP Aware  today 02/24/2019  . HYDROcodone-acetaminophen (NORCO) 10-325 MG tablet    Sig: Take 1 tablet by mouth every 6 (six) hours as needed.    Dispense:  105 tablet    Refill:  0    Rx # ONE, fill on or after March 01, 2019; reviewed PMP Aware 02/24/2019    No orders of the defined types were placed in this encounter.

## 2019-02-24 NOTE — Assessment & Plan Note (Signed)
Managed by ENT, Dr. Richardson Landry she says; they call her for appts when needed she says

## 2019-02-24 NOTE — Assessment & Plan Note (Signed)
Controlled with current medicines; no red flags after review of the PMP Aware web site; return in 3 months for next visit

## 2019-02-24 NOTE — Assessment & Plan Note (Addendum)
Avoid passive smoke exposure as much as possible; offered referral to pulmonologist but patient politley declined citing COVID-19 outbreak; I advised her that she is at high risk of complications of YPEJY-11 if contracted; social isolation, hand washing, don't touch face, etc.; continue inhaler

## 2019-05-26 ENCOUNTER — Ambulatory Visit: Payer: Medicare Other | Admitting: Family Medicine

## 2019-05-26 DIAGNOSIS — I10 Essential (primary) hypertension: Secondary | ICD-10-CM | POA: Diagnosis not present

## 2019-05-26 DIAGNOSIS — I2510 Atherosclerotic heart disease of native coronary artery without angina pectoris: Secondary | ICD-10-CM | POA: Diagnosis not present

## 2019-05-26 DIAGNOSIS — R0989 Other specified symptoms and signs involving the circulatory and respiratory systems: Secondary | ICD-10-CM | POA: Diagnosis not present

## 2019-05-26 DIAGNOSIS — I739 Peripheral vascular disease, unspecified: Secondary | ICD-10-CM | POA: Diagnosis not present

## 2019-05-26 DIAGNOSIS — I340 Nonrheumatic mitral (valve) insufficiency: Secondary | ICD-10-CM | POA: Diagnosis not present

## 2019-06-02 ENCOUNTER — Encounter: Payer: Self-pay | Admitting: Family Medicine

## 2019-06-02 ENCOUNTER — Ambulatory Visit (INDEPENDENT_AMBULATORY_CARE_PROVIDER_SITE_OTHER): Payer: Medicare Other | Admitting: Family Medicine

## 2019-06-02 ENCOUNTER — Other Ambulatory Visit: Payer: Self-pay

## 2019-06-02 DIAGNOSIS — E041 Nontoxic single thyroid nodule: Secondary | ICD-10-CM

## 2019-06-02 DIAGNOSIS — M5134 Other intervertebral disc degeneration, thoracic region: Secondary | ICD-10-CM | POA: Diagnosis not present

## 2019-06-02 DIAGNOSIS — Z79899 Other long term (current) drug therapy: Secondary | ICD-10-CM | POA: Diagnosis not present

## 2019-06-02 DIAGNOSIS — M19041 Primary osteoarthritis, right hand: Secondary | ICD-10-CM

## 2019-06-02 DIAGNOSIS — E441 Mild protein-calorie malnutrition: Secondary | ICD-10-CM

## 2019-06-02 DIAGNOSIS — I6523 Occlusion and stenosis of bilateral carotid arteries: Secondary | ICD-10-CM

## 2019-06-02 DIAGNOSIS — J431 Panlobular emphysema: Secondary | ICD-10-CM

## 2019-06-02 DIAGNOSIS — I10 Essential (primary) hypertension: Secondary | ICD-10-CM

## 2019-06-02 DIAGNOSIS — N183 Chronic kidney disease, stage 3 unspecified: Secondary | ICD-10-CM

## 2019-06-02 DIAGNOSIS — G894 Chronic pain syndrome: Secondary | ICD-10-CM | POA: Diagnosis not present

## 2019-06-02 DIAGNOSIS — M19042 Primary osteoarthritis, left hand: Secondary | ICD-10-CM

## 2019-06-02 MED ORDER — ALBUTEROL SULFATE HFA 108 (90 BASE) MCG/ACT IN AERS: INHALATION_SPRAY | RESPIRATORY_TRACT | 3 refills | Status: DC

## 2019-06-02 MED ORDER — HYDROCODONE-ACETAMINOPHEN 10-325 MG PO TABS: 1.0000 | ORAL_TABLET | Freq: Four times a day (QID) | ORAL | 0 refills | Status: AC | PRN

## 2019-06-02 MED ORDER — TRELEGY ELLIPTA 100-62.5-25 MCG/INH IN AEPB: 1.0000 | INHALATION_SPRAY | Freq: Every day | RESPIRATORY_TRACT | 3 refills | Status: DC

## 2019-06-02 NOTE — Progress Notes (Signed)
Name: Samantha Dawson   MRN: [id number]    DOB: 1929/03/08   Date:06/02/2019       Progress Note  Subjective  Chief Complaint  Chief Complaint  Patient presents with  . Follow-up  . Medication Refill    I connected with  Samantha Dawson on 06/02/19 at 10:20 AM EDT by telephone and verified that I am speaking with the correct person using two identifiers.  I discussed the limitations, risks, security and privacy concerns of performing an evaluation and management service by telephone and the availability of in person appointments. Staff also discussed with the patient that there may be a patient responsible charge related to this service. Patient Location: Home Provider Location: Office Additional Individuals present: None  HPI  Chronic pain from arthritis/CPS: "it's about the same" except some worsening when the weather is damp.  She is currently taking percocet - previously prescribed by Dr. Sanda Dawson at [phone] - she is taking 3.5 tablets once daily - #105 to last 30 days - has historically been very compliant with this regimen. No problems with her medicine; no issues with SE's of percocet - has been on for many years.  No problems with constipation - takes stool softener PRN. She keeps her medicine locked in a safe.  PMP aware is reviewed and no concerning findings.  COPD: Using bevespi - still using proair 2-3 times a day for SOB - we will change bevespi to trelegy to try to gain better control of her symptoms.  Daughter smokes outside, not in the house. She is former smoker - quit 3 years ago.  HTN/CKD: BP's at home running 110/64 steady. She is taking 12.5mg  and metoprolol XL 50mg  daily. No headaches, chest pain, has shortness of breath due to COPD, blurred vision, lightheadedness or dizziness, no orthostatic dizziness.  Does have CKD and was referred to nephrology in October, but never went.   CAD/HLD:  She has been taking plavix for many years due to carotid stenosis and CAD.  She  is tolerating statin therapy well as well.  Taking aspirin daily as well.  Seeing Dr. Chancy Dawson.  Thyroid nodule: managed by ENT, Dr. Richardson Dawson  Patient Active Problem List   Diagnosis Date Noted  . Carotid atherosclerosis, bilateral 08/27/2018  . Bilateral carotid bruits 08/27/2018  . CKD (chronic kidney disease) stage 3, GFR 30-59 ml/min (HCC) 08/27/2018  . Closed fracture of left tibial plateau 09/13/2017  . Closed nondisplaced fracture of neck of right radius 09/13/2017  . Shoulder pain, left 08/29/2017  . Thigh pain, musculoskeletal, left 11/29/2016  . Acid reflux 10/15/2016  . Hypoxemia 09/08/2016  . Uncomplicated opioid use 54/56/2563  . Hyperglycemia 09/02/2016  . Left thyroid nodule 07/13/2016  . Abnormal bone scan of cervical spine 06/18/2016  . Primary osteoarthritis of both hands 05/03/2016  . Elevated serum alkaline phosphatase level 05/03/2016  . Hyperkalemia 05/03/2016  . Medication monitoring encounter 05/03/2016  . 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 Hx of smoking 04/17/2016  . Controlled substance agreement signed 03/22/2016  . Osteoporosis 03/22/2016  . Back pain 03/22/2016  . Hyperlipidemia 10/25/2015  . Essential hypertension 10/25/2015  . Panlobular emphysema ([place]) 10/25/2015  . Protein calorie malnutrition ([place]) 05/26/2015  . Chronic pain 05/25/2015  . Actinic keratosis 05/25/2015    Past Surgical History:  Procedure Laterality Date  . ABDOMINAL HYSTERECTOMY    . CATARACT EXTRACTION    . endaryerectomy    . renal stenting      Family History  Problem Relation Age of Onset  . Cancer Mother   . Coronary artery disease Father   . Cerebrovascular Accident Father   . Cancer Sister        breast cancer  . Cancer Brother        stomach  . Heart disease Daughter        heart vavle bypass?  . Stroke Sister   . Lung disease Brother   . Cancer Brother        unknown  . Arthritis Daughter     Social History   Socioeconomic History  .  Marital status: Widowed    Spouse name: Samantha Dawson  . Number of children: 3  . Years of education: Not on file  . Highest education level: 10th grade  Occupational History  . Occupation: Retired  Scientific laboratory technician  . Financial resource strain: Not hard at all  . Food insecurity    Worry: Never true    Inability: Never true  . Transportation needs    Medical: No    Non-medical: No  Tobacco Use  . Smoking status: Former Smoker    Packs/day: 1.00    Years: 50.00    Pack years: 50.00    Types: Cigarettes    Quit date: 2017    Years since quitting: 3.5  . Smokeless tobacco: Never Used  . Tobacco comment: smoking cesssation materials not required  Substance and Sexual Activity  . Alcohol use: No    Alcohol/week: 0.0 standard drinks  . Drug use: No  . Sexual activity: Not Currently  Lifestyle  . Physical activity    Days per week: 0 days    Minutes per session: 0 min  . Stress: Not at all  Relationships  . Social Herbalist on phone: Patient refused    Gets together: Patient refused    Attends religious service: Patient refused    Active member of club or organization: Patient refused    Attends meetings of clubs or organizations: Patient refused    Relationship status: Widowed  . Intimate partner violence    Fear of current or ex partner: No    Emotionally abused: No    Physically abused: No    Forced sexual activity: No  Other Topics Concern  . Not on file  Social History Narrative  . Not on file     Current Outpatient Medications:  .  aspirin 81 MG tablet, Take 81 mg by mouth daily., Disp: , Rfl:  .  Aspirin-Calcium Carbonate (BAYER WOMENS) [id number] MG TABS, Take by mouth., Disp: , Rfl:  .  clopidogrel (PLAVIX) 75 MG tablet, Take 1 tablet (75 mg total) by mouth daily., Disp: 30 tablet, Rfl: 11 .  co-enzyme Q-10 30 MG capsule, Take 100 mg by mouth daily. , Disp: , Rfl:  .  fluorouracil (EFUDEX) 5 % cream, , Disp: , Rfl:  .  Glycopyrrolate-Formoterol (BEVESPI  AEROSPHERE) 9-4.8 MCG/ACT AERO, Take 2 puffs by mouth 2 (two) times daily., Disp: 1 Inhaler, Rfl: 11 .  HYDROcodone-acetaminophen (NORCO) 10-325 MG tablet, Take 1 tablet by mouth every 6 (six) hours as needed., Disp: 105 tablet, Rfl: 0 .  HYDROcodone-acetaminophen (NORCO) 10-325 MG tablet, Take 1 tablet by mouth every 6 (six) hours as needed., Disp: 105 tablet, Rfl: 0 .  HYDROcodone-acetaminophen (NORCO) 10-325 MG tablet, Take 1 tablet by mouth every 6 (six) hours as needed., Disp: 105 tablet, Rfl: 0 .  losartan (COZAAR) 25 MG tablet, Take 0.5 tablets (12.5 mg  total) by mouth daily., Disp: 45 tablet, Rfl: 1 .  metoprolol succinate (TOPROL-XL) 50 MG 24 hr tablet, Take 1 tablet (50 mg total) by mouth daily. Take with or immediately following a meal., Disp: 30 tablet, Rfl: 11 .  pantoprazole (PROTONIX) 20 MG tablet, Take 1 tablet (20 mg total) by mouth daily. Caution:prolonged use may increase risk of pneumonia, colitis, osteoporosis, anemia, Disp: 90 tablet, Rfl: 0 .  PROAIR HFA 108 (90 Base) MCG/ACT inhaler, INHALE 2 PUFFS INTO THE LUNGS EVERY 4 HOURS AS NEEDED FOR WHEEZING OR SHORTNESS OF BREATH; FUTURE REFILLS FROM LUNG DOCTOR, Disp: 8.5 g, Rfl: 1 .  rosuvastatin (CRESTOR) 10 MG tablet, Take 10 mg by mouth daily., Disp: , Rfl:  .  fluticasone (FLONASE) 50 MCG/ACT nasal spray, Place 2 sprays into both nostrils as needed. , Disp: , Rfl:   Allergies  Allergen Reactions  . Alendronate Nausea Only    I personally reviewed active problem list, medication list, allergies, notes from last encounter, lab results with the patient/caregiver today.   ROS  Constitutional: Negative for fever or weight change.  Respiratory: Negative for cough and shortness of breath.   Cardiovascular: Negative for chest pain or palpitations.  Gastrointestinal: Negative for abdominal pain, no bowel changes.  Musculoskeletal: Negative for gait problem or joint swelling.  Skin: Negative for rash.  Neurological: Negative  for dizziness or headache.  No other specific complaints in a complete review of systems (except as listed in HPI above)  Objective  Virtual encounter, vitals not obtained.  There is no height or weight on file to calculate BMI.  Physical Exam  Pulmonary/Chest: Effort normal. No respiratory distress. Speaking in complete sentences Neurological: Pt is alert and oriented to person, place, and time. Speech is normal  Psychiatric: Patient has a normal mood and affect. behavior is normal. Judgment and thought content normal.  No results found for this or any previous visit (from the past 72 hour(s)).  PHQ2/9: Depression screen [facility] 2/9 06/02/2019 06/02/2019 11/25/2018 08/27/2018 05/29/2018  Decreased Interest 0 0 0 0 0  Down, Depressed, Hopeless 0 0 0 0 0  PHQ - 2 Score 0 0 0 0 0  Altered sleeping 0 0 0 0 -  Tired, decreased energy 0 0 0 0 -  Change in appetite 0 0 0 0 -  Feeling bad or failure about yourself  0 0 0 0 -  Trouble concentrating 0 0 0 0 -  Moving slowly or fidgety/restless 0 0 0 0 -  Suicidal thoughts 0 0 0 0 -  PHQ-9 Score 0 0 0 0 -  Difficult doing work/chores Not difficult at all Not difficult at all Not difficult at all Not difficult at all -   PHQ-2/9 Result is negative.    Fall Risk: Fall Risk  06/02/2019 06/02/2019 02/24/2019 11/25/2018 08/27/2018  Falls in the past year? 0 0 0 0 No  Number falls in past yr: 0 0 - 0 -  Injury with Fall? 0 0 - 0 -  Comment - - - - -  Risk Factor Category  - - - - -  Risk for fall due to : - - - - -  Risk for fall due to: Comment - - - - -  Follow up - Falls evaluation completed - - -    Assessment & Plan  1. Chronic pain syndrome - Percocet per orders.  She is doing okay on current dosing.  Using heat and ice safely and PRN.  2.  Degenerative disc disease, thoracic - HYDROcodone-acetaminophen (NORCO) 10-325 MG tablet; Take 1 tablet by mouth every 6 (six) hours as needed.  Dispense: 105 tablet; Refill: 0 -  HYDROcodone-acetaminophen (NORCO) 10-325 MG tablet; Take 1 tablet by mouth every 6 (six) hours as needed.  Dispense: 105 tablet; Refill: 0 - HYDROcodone-acetaminophen (NORCO) 10-325 MG tablet; Take 1 tablet by mouth every 6 (six) hours as needed.  Dispense: 105 tablet; Refill: 0  3. Primary osteoarthritis of both hands - Percocet per orders.  4. Controlled substance agreement signed - Will sign new contract when abel to come in to office safely in the context of COVID-19 pandemic.  5. Panlobular emphysema ([place]) - STOP Bavespi - she is using proair 3 times daily for shortness of breath, will increase treatment to trelegy to gain better control of her symptoms, decrease medication load by using once daily dosing, and the device is easier to use for her arthritic hands. - albuterol (PROAIR HFA) 108 (90 Base) MCG/ACT inhaler; INHALE 2 PUFFS INTO THE LUNGS EVERY 4 HOURS AS NEEDED FOR WHEEZING OR SHORTNESS OF BREATH; FUTURE REFILLS FROM LUNG DOCTOR  Dispense: 8.5 g; Refill: 3 - Fluticasone-Umeclidin-Vilant (TRELEGY ELLIPTA) 100-62.5-25 MCG/INH AEPB; Inhale 1 puff into the lungs daily.  Dispense: 60 each; Refill: 3  6. Mild protein-calorie malnutrition ([place]) - Encouraged balanced diet with protein and regular meals  7. Essential hypertension - Doing well - may consider decreasing her dosing if symptoms of hypotension occur  8. Left thyroid nodule - Seeing ENT  9. Carotid atherosclerosis, bilateral - Taking plavix, seeing cardiology  10. CKD (chronic kidney disease) stage 3, GFR 30-59 ml/min (HCC) - She needs labs - is due for labs with Dr. Chancy Dawson and would like to have those faxed to [place] instead of going to 2 different places. We will try to set this up for her.  I discussed the assessment and treatment plan with the patient. The patient was provided an opportunity to ask questions and all were answered. The patient agreed with the plan and demonstrated an understanding of the instructions.    The patient was advised to call back or seek an in-person evaluation if the symptoms worsen or if the condition fails to improve as anticipated.  I provided 23 minutes of non-face-to-face time during this encounter.  Hubbard Hartshorn, FNP

## 2019-06-11 DIAGNOSIS — I2510 Atherosclerotic heart disease of native coronary artery without angina pectoris: Secondary | ICD-10-CM | POA: Diagnosis not present

## 2019-06-16 DIAGNOSIS — E782 Mixed hyperlipidemia: Secondary | ICD-10-CM | POA: Diagnosis not present

## 2019-06-16 DIAGNOSIS — I10 Essential (primary) hypertension: Secondary | ICD-10-CM | POA: Diagnosis not present

## 2019-06-16 DIAGNOSIS — R0602 Shortness of breath: Secondary | ICD-10-CM | POA: Diagnosis not present

## 2019-06-16 DIAGNOSIS — I2510 Atherosclerotic heart disease of native coronary artery without angina pectoris: Secondary | ICD-10-CM | POA: Diagnosis not present

## 2019-07-11 ENCOUNTER — Other Ambulatory Visit: Payer: Self-pay | Admitting: Family Medicine

## 2019-07-11 DIAGNOSIS — I10 Essential (primary) hypertension: Secondary | ICD-10-CM

## 2019-09-02 ENCOUNTER — Encounter: Payer: Self-pay | Admitting: Family Medicine

## 2019-09-02 ENCOUNTER — Other Ambulatory Visit: Payer: Self-pay

## 2019-09-02 ENCOUNTER — Ambulatory Visit (INDEPENDENT_AMBULATORY_CARE_PROVIDER_SITE_OTHER): Payer: Medicare Other | Admitting: Family Medicine

## 2019-09-02 DIAGNOSIS — E041 Nontoxic single thyroid nodule: Secondary | ICD-10-CM

## 2019-09-02 DIAGNOSIS — M19042 Primary osteoarthritis, left hand: Secondary | ICD-10-CM

## 2019-09-02 DIAGNOSIS — I6523 Occlusion and stenosis of bilateral carotid arteries: Secondary | ICD-10-CM

## 2019-09-02 DIAGNOSIS — F1190 Opioid use, unspecified, uncomplicated: Secondary | ICD-10-CM

## 2019-09-02 DIAGNOSIS — G894 Chronic pain syndrome: Secondary | ICD-10-CM | POA: Diagnosis not present

## 2019-09-02 DIAGNOSIS — N183 Chronic kidney disease, stage 3 unspecified: Secondary | ICD-10-CM

## 2019-09-02 DIAGNOSIS — M19041 Primary osteoarthritis, right hand: Secondary | ICD-10-CM | POA: Diagnosis not present

## 2019-09-02 DIAGNOSIS — J431 Panlobular emphysema: Secondary | ICD-10-CM

## 2019-09-02 DIAGNOSIS — E782 Mixed hyperlipidemia: Secondary | ICD-10-CM

## 2019-09-02 DIAGNOSIS — Z79899 Other long term (current) drug therapy: Secondary | ICD-10-CM | POA: Diagnosis not present

## 2019-09-02 DIAGNOSIS — K219 Gastro-esophageal reflux disease without esophagitis: Secondary | ICD-10-CM

## 2019-09-02 DIAGNOSIS — I10 Essential (primary) hypertension: Secondary | ICD-10-CM

## 2019-09-02 DIAGNOSIS — M5134 Other intervertebral disc degeneration, thoracic region: Secondary | ICD-10-CM | POA: Diagnosis not present

## 2019-09-02 MED ORDER — HYDROCODONE-ACETAMINOPHEN 10-325 MG PO TABS: 1.0000 | ORAL_TABLET | Freq: Four times a day (QID) | ORAL | 0 refills | Status: DC | PRN

## 2019-09-02 MED ORDER — HYDROCODONE-ACETAMINOPHEN 10-325 MG PO TABS: 1.0000 | ORAL_TABLET | Freq: Four times a day (QID) | ORAL | 0 refills | Status: AC | PRN

## 2019-09-02 MED ORDER — CLOPIDOGREL BISULFATE 75 MG PO TABS: 75.0000 mg | ORAL_TABLET | Freq: Every day | ORAL | 3 refills | Status: DC

## 2019-09-02 MED ORDER — PANTOPRAZOLE SODIUM 20 MG PO TBEC: 20.0000 mg | DELAYED_RELEASE_TABLET | Freq: Two times a day (BID) | ORAL | 0 refills | Status: DC

## 2019-09-02 NOTE — Progress Notes (Signed)
Name: Samantha Dawson   MRN: [id number]    DOB: Mar 27, 1929   Date:09/02/2019       Progress Note  Subjective  Chief Complaint  Chief Complaint  Patient presents with  . Follow-up    3 month  . Medication Refill    I connected with  Samantha Dawson on 09/02/19 at 10:00 AM EDT by telephone and verified that I am speaking with the correct person using two identifiers.  I discussed the limitations, risks, security and privacy concerns of performing an evaluation and management service by telephone and the availability of in person appointments. Staff also discussed with the patient that there may be a patient responsible charge related to this service. Patient Location: Home Provider Location: Office Additional Individuals present: None  HPI  Chronic pain from arthritis/CPS: "it's about the same" except some worsening when the weather is damp.  She is currently taking percocet - previously prescribed by Dr. Sanda Dawson at [id number] - she is taking 3.5 tablets once daily - #105 to last 30 days - has historically been very compliant with this regimen. No problems with her medicine; no issues with SE's of percocet - has been on for many years.  No problems with constipation - takes stool softener PRN. She still keeps her medicine locked in a safe.  PMP aware is reviewed and no concerning findings.  Due for UDS and to   COPD: Using trelegy - still using proair, but less than she was before - down to about 1 time a day.  She does have to rest sometimes, but overall controls her shortness of breath while doing ADL's by pacing herself.  She is former smoker - quit 3 years ago.  Family stress: The daughter that was living with her had a stroke and is now paralyzed on one side, living in a nursing home.  Her other two daughters check on her each day - calling and physically come to the home.  HTN/CKD: BP's at home running 115/645-70 steady. She is taking 12.5mg  Losartan and metoprolol XL 50mg  daily. No  headaches, chest pain, has shortness of breath due to COPD, blurred vision, lightheadedness or dizziness, no orthostatic dizziness.  Does have CKD and was referred to nephrology in October, but never went - we will recheck labs and determine if she needs new referral.   CAD/HLD:  She has been taking plavix for many years due to carotid stenosis and CAD.  She is tolerating statin therapy well as well.  Taking aspirin daily as well. No signs or symptoms of bleeding, does get some easy bruising  Seeing Dr. Chancy Dawson- has upcoming appt in January 2021 to repeat Carotid US.  GERD: Taking protonix daily (sometimes BID), still has heartburn but is better controlled.  She denies difficulty swallowing. Also takes tums PRN.   Thyroid nodule: managed by ENT, Dr. Richardson Dawson  Patient Active Problem List   Diagnosis Date Noted  . Carotid atherosclerosis, bilateral 08/27/2018  . Bilateral carotid bruits 08/27/2018  . CKD (chronic kidney disease) stage 3, GFR 30-59 ml/min 08/27/2018  . Closed fracture of left tibial plateau 09/13/2017  . Closed nondisplaced fracture of neck of right radius 09/13/2017  . Shoulder pain, left 08/29/2017  . Thigh pain, musculoskeletal, left 11/29/2016  . Acid reflux 10/15/2016  . Hypoxemia 09/08/2016  . Uncomplicated opioid use [id number]  . Hyperglycemia 09/02/2016  . Left thyroid nodule 07/13/2016  . Abnormal bone scan of cervical spine 06/18/2016  . Primary osteoarthritis of both hands  05/03/2016  . Elevated serum alkaline phosphatase level 05/03/2016  . Hyperkalemia 05/03/2016  . 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 Hx of smoking 04/17/2016  . Controlled substance agreement signed 03/22/2016  . Osteoporosis 03/22/2016  . Back pain 03/22/2016  . Hyperlipidemia 10/25/2015  . Essential hypertension 10/25/2015  . Panlobular emphysema ([place]) 10/25/2015  . Protein calorie malnutrition ([place]) 05/26/2015  . Chronic pain 05/25/2015  . Actinic keratosis 05/25/2015     Past Surgical History:  Procedure Laterality Date  . ABDOMINAL HYSTERECTOMY    . CATARACT EXTRACTION    . endaryerectomy    . renal stenting      Family History  Problem Relation Age of Onset  . Cancer Mother   . Coronary artery disease Father   . Cerebrovascular Accident Father   . Cancer Sister        breast cancer  . Cancer Brother        stomach  . Heart disease Daughter        heart vavle bypass?  . Stroke Sister   . Lung disease Brother   . Cancer Brother        unknown  . Arthritis Daughter     Social History   Socioeconomic History  . Marital status: Widowed    Spouse name: Samantha Dawson  . Number of children: 3  . Years of education: Not on file  . Highest education level: 10th grade  Occupational History  . Occupation: Retired  Scientific laboratory technician  . Financial resource strain: Not hard at all  . Food insecurity    Worry: Never true    Inability: Never true  . Transportation needs    Medical: No    Non-medical: No  Tobacco Use  . Smoking status: Former Smoker    Packs/day: 1.00    Years: 50.00    Pack years: 50.00    Types: Cigarettes    Quit date: 2017    Years since quitting: 3.8  . Smokeless tobacco: Never Used  . Tobacco comment: smoking cesssation materials not required  Substance and Sexual Activity  . Alcohol use: No    Alcohol/week: 0.0 standard drinks  . Drug use: No  . Sexual activity: Not Currently  Lifestyle  . Physical activity    Days per week: 0 days    Minutes per session: 0 min  . Stress: Not at all  Relationships  . Social Herbalist on phone: Patient refused    Gets together: Patient refused    Attends religious service: Patient refused    Active member of club or organization: Patient refused    Attends meetings of clubs or organizations: Patient refused    Relationship status: Widowed  . Intimate partner violence    Fear of current or ex partner: No    Emotionally abused: No    Physically abused: No    Forced  sexual activity: No  Other Topics Concern  . Not on file  Social History Narrative  . Not on file     Current Outpatient Medications:  .  albuterol (PROAIR HFA) 108 (90 Base) MCG/ACT inhaler, INHALE 2 PUFFS INTO THE LUNGS EVERY 4 HOURS AS NEEDED FOR WHEEZING OR SHORTNESS OF BREATH; FUTURE REFILLS FROM LUNG DOCTOR, Disp: 8.5 g, Rfl: 3 .  aspirin 81 MG tablet, Take 81 mg by mouth daily., Disp: , Rfl:  .  Aspirin-Calcium Carbonate (BAYER WOMENS) 81-777 MG TABS, Take by mouth., Disp: , Rfl:  .  clopidogrel (PLAVIX)  75 MG tablet, Take 1 tablet (75 mg total) by mouth daily., Disp: 30 tablet, Rfl: 11 .  co-enzyme Q-10 30 MG capsule, Take 100 mg by mouth daily. , Disp: , Rfl:  .  fluorouracil (EFUDEX) 5 % cream, , Disp: , Rfl:  .  fluticasone (FLONASE) 50 MCG/ACT nasal spray, Place 2 sprays into both nostrils as needed. , Disp: , Rfl:  .  Fluticasone-Umeclidin-Vilant (TRELEGY ELLIPTA) 100-62.5-25 MCG/INH AEPB, Inhale 1 puff into the lungs daily., Disp: 60 each, Rfl: 3 .  losartan (COZAAR) 25 MG tablet, TAKE 1/2 TABLET(12.5 MG) BY MOUTH DAILY, Disp: 45 tablet, Rfl: 1 .  metoprolol succinate (TOPROL-XL) 50 MG 24 hr tablet, TAKE 1 TABLET BY MOUTH EVERY DAY. TAKE WITH OR IMMEDIATELY FOLLOWING A MEAL, Disp: 90 tablet, Rfl: 1 .  pantoprazole (PROTONIX) 20 MG tablet, Take 1 tablet (20 mg total) by mouth daily. Caution:prolonged use may increase risk of pneumonia, colitis, osteoporosis, anemia, Disp: 90 tablet, Rfl: 0 .  rosuvastatin (CRESTOR) 10 MG tablet, Take 10 mg by mouth daily., Disp: , Rfl:   Allergies  Allergen Reactions  . Alendronate Nausea Only    I personally reviewed active problem list, medication list, allergies, health maintenance, notes from last encounter, lab results with the patient/caregiver today.   ROS Ten systems reviewed and is negative except as mentioned in HPI  Objective  Virtual encounter, vitals not obtained.  There is no height or weight on file to calculate BMI.   Physical Exam  Pulmonary/Chest: Effort normal. No respiratory distress. Speaking in complete sentences Neurological: Pt is alert and oriented to person, place, and time. Coordination, speech and gait are normal.  Psychiatric: Patient has a normal mood and affect. behavior is normal. Judgment and thought content normal.  No results found for this or any previous visit (from the past 72 hour(s)).  PHQ2/9: Depression screen [facility] 2/9 09/02/2019 06/02/2019 06/02/2019 11/25/2018 08/27/2018  Decreased Interest 0 0 0 0 0  Down, Depressed, Hopeless 0 0 0 0 0  PHQ - 2 Score 0 0 0 0 0  Altered sleeping 0 0 0 0 0  Tired, decreased energy 0 0 0 0 0  Change in appetite 0 0 0 0 0  Feeling bad or failure about yourself  0 0 0 0 0  Trouble concentrating 0 0 0 0 0  Moving slowly or fidgety/restless 0 0 0 0 0  Suicidal thoughts 0 0 0 0 0  PHQ-9 Score 0 0 0 0 0  Difficult doing work/chores Not difficult at all Not difficult at all Not difficult at all Not difficult at all Not difficult at all   PHQ-2/9 Result is negative.    Fall Risk: Fall Risk  09/02/2019 06/02/2019 06/02/2019 02/24/2019 11/25/2018  Falls in the past year? 0 0 0 0 0  Number falls in past yr: 0 0 0 - 0  Injury with Fall? 0 0 0 - 0  Comment - - - - -  Risk Factor Category  - - - - -  Risk for fall due to : - - - - -  Risk for fall due to: Comment - - - - -  Follow up Falls evaluation completed - Falls evaluation completed - -   Assessment & Plan  1. Chronic pain syndrome - HYDROcodone-acetaminophen (NORCO) 10-325 MG tablet; Take 1 tablet by mouth every 6 (six) hours as needed for severe pain.  Dispense: 105 tablet; Refill: 0 - HYDROcodone-acetaminophen (NORCO) 10-325 MG tablet; Take 1 tablet by  mouth every 6 (six) hours as needed.  Dispense: 105 tablet; Refill: 0 - HYDROcodone-acetaminophen (NORCO) 10-325 MG tablet; Take 1 tablet by mouth every 6 (six) hours as needed for severe pain.  Dispense: 105 tablet; Refill: 0  2. Degenerative  disc disease, thoracic - HYDROcodone-acetaminophen (NORCO) 10-325 MG tablet; Take 1 tablet by mouth every 6 (six) hours as needed for severe pain.  Dispense: 105 tablet; Refill: 0 - HYDROcodone-acetaminophen (NORCO) 10-325 MG tablet; Take 1 tablet by mouth every 6 (six) hours as needed.  Dispense: 105 tablet; Refill: 0 - HYDROcodone-acetaminophen (NORCO) 10-325 MG tablet; Take 1 tablet by mouth every 6 (six) hours as needed for severe pain.  Dispense: 105 tablet; Refill: 0  3. Primary osteoarthritis of both hands - HYDROcodone-acetaminophen (NORCO) 10-325 MG tablet; Take 1 tablet by mouth every 6 (six) hours as needed for severe pain.  Dispense: 105 tablet; Refill: 0 - HYDROcodone-acetaminophen (NORCO) 10-325 MG tablet; Take 1 tablet by mouth every 6 (six) hours as needed.  Dispense: 105 tablet; Refill: 0 - HYDROcodone-acetaminophen (NORCO) 10-325 MG tablet; Take 1 tablet by mouth every 6 (six) hours as needed for severe pain.  Dispense: 105 tablet; Refill: 0  4. Controlled substance agreement signed - Due for UDS and new contract when she is able to come in - dependent on daughters for transportation.  - HYDROcodone-acetaminophen ([place]) 10-325 MG tablet; Take 1 tablet by mouth every 6 (six) hours as needed for severe pain.  Dispense: 105 tablet; Refill: 0 - HYDROcodone-acetaminophen (NORCO) 10-325 MG tablet; Take 1 tablet by mouth every 6 (six) hours as needed.  Dispense: 105 tablet; Refill: 0 - HYDROcodone-acetaminophen (NORCO) 10-325 MG tablet; Take 1 tablet by mouth every 6 (six) hours as needed for severe pain.  Dispense: 105 tablet; Refill: 0  5. Panlobular emphysema ([facility]) - Using trelegy, symptoms are improved. Using Albuterol less than previously.  6. Stage 3 chronic kidney disease, unspecified whether stage 3a or 3b CKD - Will recheck labs; if worsening kidney function, will consider referral back to nephrology as this was recommended previously by Dr. Sanda Dawson.  Suspect decline is  secondary to advanced age. - CBC with Differential/Platelet - COMPLETE METABOLIC PANEL WITH GFR  7. Carotid atherosclerosis, bilateral - Follow up with D.r Humphrey Rolls - Lipid panel - clopidogrel (PLAVIX) 75 MG tablet; Take 1 tablet (75 mg total) by mouth daily.  Dispense: 90 tablet; Refill: 3  8. Essential hypertension - Stable per patient's report; continue current regimen - CMP - Lipid panel  9. Mixed hyperlipidemia - Lipid panel - clopidogrel (PLAVIX) 75 MG tablet; Take 1 tablet (75 mg total) by mouth daily.  Dispense: 90 tablet; Refill: 3  10. Gastroesophageal reflux disease, unspecified whether esophagitis present - Tums PRN; dietary modifications - pantoprazole (PROTONIX) 20 MG tablet; Take 1 tablet (20 mg total) by mouth 2 (two) times daily. Caution:prolonged use may increase risk of pneumonia, colitis, osteoporosis, anemia  Dispense: 180 tablet; Refill: 0  11. Left thyroid nodule - Followed by ENT.   I discussed the assessment and treatment plan with the patient. The patient was provided an opportunity to ask questions and all were answered. The patient agreed with the plan and demonstrated an understanding of the instructions.   The patient was advised to call back or seek an in-person evaluation if the symptoms worsen or if the condition fails to improve as anticipated.  I provided 24 minutes of non-face-to-face time during this encounter.  Hubbard Hartshorn, FNP

## 2019-09-09 ENCOUNTER — Telehealth: Payer: Self-pay | Admitting: Family Medicine

## 2019-09-09 NOTE — Chronic Care Management (AMB) (Signed)
Chronic Care Management   Note  09/09/2019 Name: Samantha Dawson MRN: [id number] DOB: [birth year]  ANTHONELLA KLAUSNER is a 85 y.o. year old female who is a primary care patient of Hubbard Hartshorn, FNP. I reached out to [name] by phone today in response to a referral sent by Ms. Wandra Arthurs Gappa's health plan.     Ms. Mcgough was given information about Chronic Care Management services today including:  1. CCM service includes personalized support from designated clinical staff supervised by her physician, including individualized plan of care and coordination with other care providers 2. 24/7 contact phone numbers for assistance for urgent and routine care needs. 3. Service will only be billed when office clinical staff spend 20 minutes or more in a month to coordinate care. 4. Only one practitioner may furnish and bill the service in a calendar month. 5. The patient may stop CCM services at any time (effective at the end of the month) by phone call to the office staff. 6. The patient will be responsible for cost sharing (co-pay) of up to 20% of the service fee (after annual deductible is met).  Patient agreed to services and verbal consent obtained.   Follow up plan: Telephone appointment with CCM team member scheduled for: 09/17/2019  [place]  ??[email or web address]   ??[phone]

## 2019-09-17 ENCOUNTER — Telehealth: Payer: Medicare Other

## 2019-09-19 LAB — CBC WITH DIFFERENTIAL/PLATELET
Absolute Monocytes: 649 cells/uL (ref 200–950)
Basophils Absolute: 48 cells/uL (ref 0–200)
Basophils Relative: 0.7 %
Eosinophils Absolute: 207 cells/uL (ref 15–500)
Eosinophils Relative: 3 %
HCT: 41.5 % (ref 35.0–45.0)
Hemoglobin: 14 g/dL (ref 11.7–15.5)
Lymphs Abs: 2767 cells/uL (ref 850–3900)
MCH: 33.7 pg — ABNORMAL HIGH (ref 27.0–33.0)
MCHC: 33.7 g/dL (ref 32.0–36.0)
MCV: 99.8 fL (ref 80.0–100.0)
MPV: 10.7 fL (ref 7.5–12.5)
Monocytes Relative: 9.4 %
Neutro Abs: 3229 cells/uL (ref 1500–7800)
Neutrophils Relative %: 46.8 %
Platelets: 187 10*3/uL (ref 140–400)
RBC: 4.16 10*6/uL (ref 3.80–5.10)
RDW: 12.3 % (ref 11.0–15.0)
Total Lymphocyte: 40.1 %
WBC: 6.9 10*3/uL (ref 3.8–10.8)

## 2019-09-19 LAB — COMPLETE METABOLIC PANEL WITH GFR
AG Ratio: 1.5 (calc) (ref 1.0–2.5)
ALT: 25 U/L (ref 6–29)
AST: 28 U/L (ref 10–35)
Albumin: 4.5 g/dL (ref 3.6–5.1)
Alkaline phosphatase (APISO): 73 U/L (ref 37–153)
BUN/Creatinine Ratio: 14 (calc) (ref 6–22)
BUN: 15 mg/dL (ref 7–25)
CO2: 29 mmol/L (ref 20–32)
Calcium: 10.2 mg/dL (ref 8.6–10.4)
Chloride: 104 mmol/L (ref 98–110)
Creat: 1.09 mg/dL — ABNORMAL HIGH (ref 0.60–0.88)
GFR, Est African American: 53 mL/min/{1.73_m2} — ABNORMAL LOW (ref >=60)
GFR, Est Non African American: 46 mL/min/{1.73_m2} — ABNORMAL LOW (ref >=60)
Globulin: 3.1 g/dL (calc) (ref 1.9–3.7)
Glucose, Bld: 110 mg/dL — ABNORMAL HIGH (ref 65–99)
Potassium: 5.6 mmol/L — ABNORMAL HIGH (ref 3.5–5.3)
Sodium: 141 mmol/L (ref 135–146)
Total Bilirubin: 0.8 mg/dL (ref 0.2–1.2)
Total Protein: 7.6 g/dL (ref 6.1–8.1)

## 2019-09-19 LAB — LIPID PANEL
Cholesterol: 142 mg/dL (ref <200)
HDL: 70 mg/dL (ref >=50)
LDL Cholesterol (Calc): 53 mg/dL (calc)
Non-HDL Cholesterol (Calc): 72 mg/dL (calc) (ref <130)
Total CHOL/HDL Ratio: 2 (calc) (ref <5.0)
Triglycerides: 102 mg/dL (ref <150)

## 2019-09-23 ENCOUNTER — Other Ambulatory Visit: Payer: Self-pay | Admitting: Family Medicine

## 2019-09-23 DIAGNOSIS — Z79899 Other long term (current) drug therapy: Secondary | ICD-10-CM

## 2019-09-23 DIAGNOSIS — G894 Chronic pain syndrome: Secondary | ICD-10-CM

## 2019-09-23 NOTE — Progress Notes (Unsigned)
UDS Due on or before 11/25/2018 Order is placed.

## 2019-09-24 NOTE — Progress Notes (Signed)
Patient notified. Stating she did this last week

## 2019-09-25 ENCOUNTER — Other Ambulatory Visit: Payer: Self-pay | Admitting: Family Medicine

## 2019-09-25 DIAGNOSIS — E875 Hyperkalemia: Secondary | ICD-10-CM

## 2019-10-08 ENCOUNTER — Other Ambulatory Visit: Payer: Self-pay | Admitting: Family Medicine

## 2019-10-08 DIAGNOSIS — J431 Panlobular emphysema: Secondary | ICD-10-CM

## 2019-10-10 ENCOUNTER — Ambulatory Visit: Payer: Self-pay

## 2019-10-10 ENCOUNTER — Telehealth: Payer: Self-pay

## 2019-10-10 NOTE — Chronic Care Management (AMB) (Signed)
  Chronic Care Management   Outreach Note  10/10/2019 Name: Samantha Dawson MRN: [id number] DOB: [birth year]  Primary Care Provider: Hubbard Hartshorn, FNP Reason for referral : Chronic Care Management   An unsuccessful telephone outreach was attempted today. Ms. Orzech was referred to the case management team for assistance with care management and care coordination. Phone rang without option to leave a voice message.  Follow Up Plan: The care management team will reach out to Ms. Hudlow again within the next two to three weeks.     [facility] [phone]

## 2019-10-27 ENCOUNTER — Ambulatory Visit (INDEPENDENT_AMBULATORY_CARE_PROVIDER_SITE_OTHER): Payer: Medicare Other

## 2019-10-27 ENCOUNTER — Other Ambulatory Visit: Payer: Self-pay

## 2019-10-27 DIAGNOSIS — E782 Mixed hyperlipidemia: Secondary | ICD-10-CM | POA: Diagnosis not present

## 2019-10-27 DIAGNOSIS — M810 Age-related osteoporosis without current pathological fracture: Secondary | ICD-10-CM | POA: Diagnosis not present

## 2019-10-27 DIAGNOSIS — I10 Essential (primary) hypertension: Secondary | ICD-10-CM

## 2019-10-27 NOTE — Chronic Care Management (AMB) (Signed)
Chronic Care Management   Initial Visit Note  10/27/2019 Name: Samantha Dawson MRN: [id number] DOB: May 25, 1929   Referred by: Hubbard Hartshorn, FNP Reason for referral : Chronic Care Management   Samantha Dawson is a 85 y.o. year old female who is a primary care patient of Hubbard Hartshorn, FNP. The CCM team was consulted for assistance with chronic disease management and care coordination. A telephonic assessment was conducted today.   Review of Ms. Lords's status, including review of consultants reports, relevant labs and test results was conducted today. Collaboration with appropriate care team members was performed as part of the comprehensive evaluation and provision of chronic care management services.     SDOH (Social Determinants of Health) screening performed today.   Medications: Outpatient Encounter Medications as of 10/27/2019  Medication Sig  . albuterol (PROAIR HFA) 108 (90 Base) MCG/ACT inhaler INHALE 2 PUFFS INTO THE LUNGS EVERY 4 HOURS AS NEEDED FOR WHEEZING OR SHORTNESS OF BREATH; FUTURE REFILLS FROM LUNG DOCTOR  . aspirin 81 MG tablet Take 81 mg by mouth daily.  . clopidogrel (PLAVIX) 75 MG tablet Take 1 tablet (75 mg total) by mouth daily.  Marland Kitchen co-enzyme Q-10 30 MG capsule Take 100 mg by mouth daily.   Marland Kitchen losartan (COZAAR) 25 MG tablet TAKE 1/2 TABLET(12.5 MG) BY MOUTH DAILY  . metoprolol succinate (TOPROL-XL) 50 MG 24 hr tablet TAKE 1 TABLET BY MOUTH EVERY DAY. TAKE WITH OR IMMEDIATELY FOLLOWING A MEAL  . pantoprazole (PROTONIX) 20 MG tablet Take 1 tablet (20 mg total) by mouth 2 (two) times daily. Caution:prolonged use may increase risk of pneumonia, colitis, osteoporosis, anemia  . rosuvastatin (CRESTOR) 10 MG tablet Take 10 mg by mouth daily.  . TRELEGY ELLIPTA 100-62.5-25 MCG/INH AEPB INHALE 1 PUFF INTO THE LUNGS DAILY  . fluorouracil (EFUDEX) 5 % cream   . fluticasone (FLONASE) 50 MCG/ACT nasal spray Place 2 sprays into both nostrils as needed.   Marland Kitchen  HYDROcodone-acetaminophen (NORCO) 10-325 MG tablet Take 1 tablet by mouth every 6 (six) hours as needed.  Derrill Memo ON 11/06/2019] HYDROcodone-acetaminophen (NORCO) 10-325 MG tablet Take 1 tablet by mouth every 6 (six) hours as needed for severe pain.   No facility-administered encounter medications on file as of 10/27/2019.      Goals Addressed            This Visit's Progress   . Chronic Disease Management       Current Barriers:  . Chronic Disease Management support and education needs related to Hypertension, Hyperlipidemia and Chronic Kidney Disease. Hx of osteoarthritis and degenerative disc disease.   Case Manager Clinical Goal(s):  Marland Kitchen Over the next 90 days, patient will attend all medical appointments. . Over the next 90 days, patient will take all medications as prescribed. . Over the next 90 days, patient will monitor blood pressure and record readings. . Over the next 90 days, patient will adhere to recommended cardiac prudent/heart healthy diet. . Over the next 90 days, patient will adhere to recommended safety and fall prevention measures.  Interventions:  . Reviewed medications and discussed indications for use. Encouraged to take all medications as prescribed. Encouraged to inform provider if unable to tolerate the prescribed regimen. Encouraged to contact care management team with concerns regarding medication management or prescription costs. . Provided education regarding s/sx of heart attack and stroke, DASH diet and complications of uncontrolled blood pressure. Discussed established BP parameters and indications for contacting provider. Encouraged to monitor BP daily and  record readings. Reports current systolic range from 367-255. Reports diastolic range in the 00'T and 70's.  . Discussed nutritional intake. Encouraged to continue adherence with recommended cardiac prudent/heart healthy diet. . Provided education regarding home safety measures and fall prevention.  Encouraged to utilize assistive devices when ambulating. . Reviewed schedule and pending appointments. Encouraged to attend all appointments as scheduled to prevent delays in care. Encouraged to notify care management team with concerns regarding transportation. . Discussed plans for ongoing care management and follow up. Provided direct contact information for care management team.    Patient Self Care Activities:  . Self administers medications  . Attends scheduled provider appointments . Performs ADL's independently . Calls provider office for new concerns or questions    Initial goal documentation        Ms. Browning was given information about Chronic Care Management services  including:  1. CCM service includes personalized support from designated clinical staff supervised by her physician, including individualized plan of care and coordination with other care providers 2. 24/7 contact phone numbers for assistance for urgent and routine care needs. 3. Service will only be billed when office clinical staff spend 20 minutes or more in a month to coordinate care. 4. Only one practitioner may furnish and bill the service in a calendar month. 5. The patient may stop CCM services at any time (effective at the end of the month) by phone call to the office staff. 6. The patient will be responsible for cost sharing (co-pay) of up to 20% of the service fee (after annual deductible is met).  Patient agreed to services and verbal consent obtained.     PLAN The care management team will follow-up with Ms. Bonito next month.   [facility] Care [facility] [phone]

## 2019-11-24 ENCOUNTER — Other Ambulatory Visit: Payer: Self-pay

## 2019-11-24 ENCOUNTER — Ambulatory Visit (INDEPENDENT_AMBULATORY_CARE_PROVIDER_SITE_OTHER): Payer: Medicare Other | Admitting: Family Medicine

## 2019-11-24 ENCOUNTER — Encounter: Payer: Self-pay | Admitting: Family Medicine

## 2019-11-24 VITALS — BP 114/61 | Ht 60.0 in | Wt 112.0 lb

## 2019-11-24 DIAGNOSIS — I10 Essential (primary) hypertension: Secondary | ICD-10-CM

## 2019-11-24 DIAGNOSIS — M19041 Primary osteoarthritis, right hand: Secondary | ICD-10-CM | POA: Diagnosis not present

## 2019-11-24 DIAGNOSIS — M5134 Other intervertebral disc degeneration, thoracic region: Secondary | ICD-10-CM

## 2019-11-24 DIAGNOSIS — Z79899 Other long term (current) drug therapy: Secondary | ICD-10-CM

## 2019-11-24 DIAGNOSIS — E041 Nontoxic single thyroid nodule: Secondary | ICD-10-CM

## 2019-11-24 DIAGNOSIS — G894 Chronic pain syndrome: Secondary | ICD-10-CM | POA: Diagnosis not present

## 2019-11-24 DIAGNOSIS — R040 Epistaxis: Secondary | ICD-10-CM

## 2019-11-24 DIAGNOSIS — Z87891 Personal history of nicotine dependence: Secondary | ICD-10-CM

## 2019-11-24 DIAGNOSIS — I129 Hypertensive chronic kidney disease with stage 1 through stage 4 chronic kidney disease, or unspecified chronic kidney disease: Secondary | ICD-10-CM | POA: Diagnosis not present

## 2019-11-24 DIAGNOSIS — M19042 Primary osteoarthritis, left hand: Secondary | ICD-10-CM

## 2019-11-24 DIAGNOSIS — F1190 Opioid use, unspecified, uncomplicated: Secondary | ICD-10-CM

## 2019-11-24 DIAGNOSIS — J310 Chronic rhinitis: Secondary | ICD-10-CM

## 2019-11-24 DIAGNOSIS — E782 Mixed hyperlipidemia: Secondary | ICD-10-CM

## 2019-11-24 DIAGNOSIS — K219 Gastro-esophageal reflux disease without esophagitis: Secondary | ICD-10-CM

## 2019-11-24 DIAGNOSIS — J431 Panlobular emphysema: Secondary | ICD-10-CM

## 2019-11-24 DIAGNOSIS — N183 Chronic kidney disease, stage 3 unspecified: Secondary | ICD-10-CM

## 2019-11-24 MED ORDER — TRIAMCINOLONE ACETONIDE 55 MCG/ACT NA AERO: 2.0000 | INHALATION_SPRAY | Freq: Every day | NASAL | 12 refills | Status: DC

## 2019-11-24 MED ORDER — HYDROCODONE-ACETAMINOPHEN 10-325 MG PO TABS: ORAL_TABLET | ORAL | 0 refills | Status: DC

## 2019-11-24 MED ORDER — HYDROCODONE-ACETAMINOPHEN 10-325 MG PO TABS: ORAL_TABLET | ORAL | 0 refills | Status: AC

## 2019-11-24 MED ORDER — TRELEGY ELLIPTA 100-62.5-25 MCG/INH IN AEPB: 1.0000 | INHALATION_SPRAY | Freq: Every day | RESPIRATORY_TRACT | 3 refills | Status: DC

## 2019-11-24 NOTE — Progress Notes (Signed)
Name: Samantha Dawson   MRN: [id number]    DOB: 1929-10-01   Date:11/24/2019       Progress Note  Subjective  Chief Complaint  Chief Complaint  Patient presents with  . Follow-up  . Medication Refill    hydrocodone, flonase and cream  . Epistaxis    1st happened Saturday night and then again this morning, woke her up running down her face.    I connected with  [name] on 11/24/19 at 10:40 AM EST by telephone and verified that I am speaking with the correct person using two identifiers.  I discussed the limitations, risks, security and privacy concerns of performing an evaluation and management service by telephone and the availability of in person appointments. Staff also discussed with the patient that there may be a patient responsible charge related to this service. Patient Location: Home Provider Location: Office Additional Individuals present: None  HPI  Chronic pain from arthritis/CPS:"it's about the same" except some worsening when the weather is damp or cold. She is currently taking percocet - previously prescribed by Dr. Sanda Klein at [phone] - she is taking 3.5 tablets split up throughout the day - #105 to last 30 days - has historically been very compliant with this regimen.  No problems with her medicine;no issues with SE's of percocet - has been on for many years.No problems with constipation - takes stool softener PRN.She still keeps her medicine locked in a safe. PMP aware is reviewed and no concerning findings.  Due for UDS and to sign controlled substance agreement.   Epistaxis: Saturday night (about 3 days ago) she had episode of epistaxis that she was able to stop. BP's have been well controlled. Discussed that plavix and dry air can contribute - she will monitor for any recurrence.   COPD: Using trelegy - still using proair PRN.  She does have to rest sometimes, but overall controls her shortness of breath while doing ADL's by pacing herself.  No nighttime  coughing She is former smoker - quit 3 years ago.  HTN/CKD: BP's at home running 115/645-70 - this morning 114/61. She is taking 12.5mg  Losartan and metoprolol XL 50mg  daily. No headaches,chest pain, has shortness of breath due to COPD, blurred vision. Does have CKD and was referred to nephrology in October, but never went - we will recheck labs and determine if she needs new referral. She does endorse some lightheadedness recently, so we will stop losartan - advised need to check kidney function.  CAD/HLD: She has been taking plavix for many years due to carotid stenosis and CAD. She is tolerating statin therapy well as well. Taking aspirin daily as well. No signs or symptoms of bleeding, does get some easy bruising Seeing Dr. Chancy Milroy- has upcoming appt in January 2021 next week to repeat Carotid US.  No changes today.    GERD: Taking protonix daily (sometimes BID), still has heartburn but is better controlled.  She denies difficulty swallowing, abdominal pain, changes in BM's. Also takes tums PRN.   Thyroid nodule:managed by ENT, Dr. Richardson Landry.  No changes  Patient Active Problem List   Diagnosis Date Noted  . Carotid atherosclerosis, bilateral 08/27/2018  . Bilateral carotid bruits 08/27/2018  . CKD (chronic kidney disease) stage 3, GFR 30-59 ml/min 08/27/2018  . Age-related osteoporosis without current pathological fracture 08/11/2018  . Shoulder pain, left 08/29/2017  . Thigh pain, musculoskeletal, left 11/29/2016  . Acid reflux 10/15/2016  . Hypoxemia 09/08/2016  . Uncomplicated opioid use [id number]  .  Hyperglycemia 09/02/2016  . Left thyroid nodule 07/13/2016  . Abnormal bone scan of cervical spine 06/18/2016  . Primary osteoarthritis of both hands 05/03/2016  . Elevated serum alkaline phosphatase level 05/03/2016  . Hyperkalemia 05/03/2016  . 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 Hx of smoking 04/17/2016  . Controlled substance agreement signed 03/22/2016  .  Back pain 03/22/2016  . Hyperlipidemia 10/25/2015  . Essential hypertension 10/25/2015  . Panlobular emphysema ([place]) 10/25/2015  . Protein calorie malnutrition ([place]) 05/26/2015  . Chronic pain 05/25/2015  . Actinic keratosis 05/25/2015    Past Surgical History:  Procedure Laterality Date  . ABDOMINAL HYSTERECTOMY    . CATARACT EXTRACTION    . endaryerectomy    . renal stenting      Family History  Problem Relation Age of Onset  . Cancer Mother   . Coronary artery disease Father   . Cerebrovascular Accident Father   . Cancer Sister        breast cancer  . Cancer Brother        stomach  . Heart disease Daughter        heart vavle bypass?  . Stroke Sister   . Lung disease Brother   . Cancer Brother        unknown  . Arthritis Daughter     Social History   Socioeconomic History  . Marital status: Widowed    Spouse name: Ihor Gully  . Number of children: 3  . Years of education: Not on file  . Highest education level: 10th grade  Occupational History  . Occupation: Retired  Tobacco Use  . Smoking status: Former Smoker    Packs/day: 1.00    Years: 50.00    Pack years: 50.00    Types: Cigarettes    Quit date: 2017    Years since quitting: 4.0  . Smokeless tobacco: Never Used  . Tobacco comment: smoking cesssation materials not required  Substance and Sexual Activity  . Alcohol use: No    Alcohol/week: 0.0 standard drinks  . Drug use: No  . Sexual activity: Not Currently  Other Topics Concern  . Not on file  Social History Narrative  . Not on file   Social Determinants of Health   Financial Resource Strain:   . Difficulty of Paying Living Expenses: Not on file  Food Insecurity:   . Worried About Charity fundraiser in the Last Year: Not on file  . Ran Out of Food in the Last Year: Not on file  Transportation Needs:   . Lack of Transportation (Medical): Not on file  . Lack of Transportation (Non-Medical): Not on file  Physical Activity:   . Days of  Exercise per Week: Not on file  . Minutes of Exercise per Session: Not on file  Stress:   . Feeling of Stress : Not on file  Social Connections:   . Frequency of Communication with Friends and Family: Not on file  . Frequency of Social Gatherings with Friends and Family: Not on file  . Attends Religious Services: Not on file  . Active Member of Clubs or Organizations: Not on file  . Attends Archivist Meetings: Not on file  . Marital Status: Not on file  Intimate Partner Violence:   . Fear of Current or Ex-Partner: Not on file  . Emotionally Abused: Not on file  . Physically Abused: Not on file  . Sexually Abused: Not on file     Current Outpatient Medications:  .  albuterol (PROAIR HFA) 108 (90 Base) MCG/ACT inhaler, INHALE 2 PUFFS INTO THE LUNGS EVERY 4 HOURS AS NEEDED FOR WHEEZING OR SHORTNESS OF BREATH; FUTURE REFILLS FROM LUNG DOCTOR, Disp: 8.5 g, Rfl: 3 .  aspirin 81 MG tablet, Take 81 mg by mouth daily., Disp: , Rfl:  .  BEVESPI AEROSPHERE 9-4.8 MCG/ACT AERO, SMARTSIG:2 Puff(s) By Mouth Twice Daily, Disp: , Rfl:  .  clopidogrel (PLAVIX) 75 MG tablet, Take 1 tablet (75 mg total) by mouth daily., Disp: 90 tablet, Rfl: 3 .  co-enzyme Q-10 30 MG capsule, Take 100 mg by mouth daily. , Disp: , Rfl:  .  fluorouracil (EFUDEX) 5 % cream, , Disp: , Rfl:  .  fluticasone (FLONASE) 50 MCG/ACT nasal spray, Place 2 sprays into both nostrils as needed. , Disp: , Rfl:  .  HYDROcodone-acetaminophen (NORCO) 10-325 MG tablet, Take 1 tablet by mouth every 6 (six) hours as needed for severe pain., Disp: 105 tablet, Rfl: 0 .  losartan (COZAAR) 25 MG tablet, TAKE 1/2 TABLET(12.5 MG) BY MOUTH DAILY, Disp: 45 tablet, Rfl: 1 .  metoprolol succinate (TOPROL-XL) 50 MG 24 hr tablet, TAKE 1 TABLET BY MOUTH EVERY DAY. TAKE WITH OR IMMEDIATELY FOLLOWING A MEAL, Disp: 90 tablet, Rfl: 1 .  pantoprazole (PROTONIX) 20 MG tablet, Take 1 tablet (20 mg total) by mouth 2 (two) times daily. Caution:prolonged  use may increase risk of pneumonia, colitis, osteoporosis, anemia, Disp: 180 tablet, Rfl: 0 .  rosuvastatin (CRESTOR) 10 MG tablet, Take 10 mg by mouth daily., Disp: , Rfl:  .  TRELEGY ELLIPTA 100-62.5-25 MCG/INH AEPB, INHALE 1 PUFF INTO THE LUNGS DAILY, Disp: 60 each, Rfl: 3  Allergies  Allergen Reactions  . Alendronate Nausea Only    I personally reviewed active problem list, medication list, allergies, notes from last encounter, lab results with the patient/caregiver today.   ROS  Ten systems reviewed and is negative except as mentioned in HPI  Objective  Virtual encounter, vitals not obtained.  Body mass index is 21.87 kg/m.  Physical Exam   Pulmonary/Chest: Effort normal. No respiratory distress. Speaking in complete sentences Neurological: Pt is alert and oriented to person, place, and time. Speech is normal Psychiatric: Patient has a normal mood and affect. behavior is normal. Judgment and thought content normal.  No results found for this or any previous visit (from the past 72 hour(s)).  PHQ2/9: Depression screen [facility] 2/9 11/24/2019 10/27/2019 09/02/2019 06/02/2019 06/02/2019  Decreased Interest 1 0 0 0 0  Down, Depressed, Hopeless 1 0 0 0 0  PHQ - 2 Score 2 0 0 0 0  Altered sleeping 1 - 0 0 0  Tired, decreased energy 1 - 0 0 0  Change in appetite 0 - 0 0 0  Feeling bad or failure about yourself  0 - 0 0 0  Trouble concentrating 0 - 0 0 0  Moving slowly or fidgety/restless 0 - 0 0 0  Suicidal thoughts 0 - 0 0 0  PHQ-9 Score 4 - 0 0 0  Difficult doing work/chores Not difficult at all - Not difficult at all Not difficult at all Not difficult at all  Some recent data might be hidden   PHQ-2/9 Result is negative.    Fall Risk: Fall Risk  11/24/2019 10/27/2019 09/02/2019 06/02/2019 06/02/2019  Falls in the past year? 0 0 0 0 0  Number falls in past yr: 0 - 0 0 0  Injury with Fall? 0 - 0 0 0  Comment - - - - -  Risk Factor Category  - - - - -  Risk for fall due to  : - Medication side effect - - -  Risk for fall due to: Comment - - - - -  Follow up - Falls prevention discussed Falls evaluation completed - Falls evaluation completed     Assessment & Plan 1. Chronic pain syndrome - HYDROcodone-acetaminophen (NORCO) 10-325 MG tablet; Take 1 tablet in the morning and in the afternoon, take 1.5 tablets at night before bed.  Dispense: 105 tablet; Refill: 0 - HYDROcodone-acetaminophen (NORCO) 10-325 MG tablet; Take 1 tablet in the morning and in the afternoon, take 1.5 tablets at night before bed.  Dispense: 105 tablet; Refill: 0 - HYDROcodone-acetaminophen (NORCO) 10-325 MG tablet; Take 1 tablet in the morning and in the afternoon, take 1.5 tablets at night before bed.  Dispense: 105 tablet; Refill: 0 - Drugs of abuse screen w/o alc (for [place] OP)  2. Degenerative disc disease, thoracic - HYDROcodone-acetaminophen (NORCO) 10-325 MG tablet; Take 1 tablet in the morning and in the afternoon, take 1.5 tablets at night before bed.  Dispense: 105 tablet; Refill: 0 - HYDROcodone-acetaminophen (NORCO) 10-325 MG tablet; Take 1 tablet in the morning and in the afternoon, take 1.5 tablets at night before bed.  Dispense: 105 tablet; Refill: 0 - HYDROcodone-acetaminophen (NORCO) 10-325 MG tablet; Take 1 tablet in the morning and in the afternoon, take 1.5 tablets at night before bed.  Dispense: 105 tablet; Refill: 0 - Drugs of abuse screen w/o alc (for [place] OP)  3. Primary osteoarthritis of both hands - HYDROcodone-acetaminophen (NORCO) 10-325 MG tablet; Take 1 tablet in the morning and in the afternoon, take 1.5 tablets at night before bed.  Dispense: 105 tablet; Refill: 0 - HYDROcodone-acetaminophen (NORCO) 10-325 MG tablet; Take 1 tablet in the morning and in the afternoon, take 1.5 tablets at night before bed.  Dispense: 105 tablet; Refill: 0 - HYDROcodone-acetaminophen (NORCO) 10-325 MG tablet; Take 1 tablet in the morning and in the afternoon, take 1.5 tablets at night  before bed.  Dispense: 105 tablet; Refill: 0 - Drugs of abuse screen w/o alc (for [place] OP)  4. Controlled substance agreement signed - Advised she must come in for UDS and to sign controlled substance agreement prior to her next refill, or we will not be able to refill additional Rx's.  She is in agreement - she relies on her daughter for transportation and will arrange this in the next 2 weeks. - HYDROcodone-acetaminophen (NORCO) 10-325 MG tablet; Take 1 tablet in the morning and in the afternoon, take 1.5 tablets at night before bed.  Dispense: 105 tablet; Refill: 0 - HYDROcodone-acetaminophen (NORCO) 10-325 MG tablet; Take 1 tablet in the morning and in the afternoon, take 1.5 tablets at night before bed.  Dispense: 105 tablet; Refill: 0 - HYDROcodone-acetaminophen (NORCO) 10-325 MG tablet; Take 1 tablet in the morning and in the afternoon, take 1.5 tablets at night before bed.  Dispense: 105 tablet; Refill: 0 - Drugs of abuse screen w/o alc (for [place] OP)  5. Panlobular emphysema ([place]) - Fluticasone-Umeclidin-Vilant (TRELEGY ELLIPTA) 100-62.5-25 MCG/INH AEPB; Inhale 1 puff into the lungs daily.  Dispense: 60 each; Refill: 3  6. Essential hypertension - COMPLETE METABOLIC PANEL WITH GFR  7. Gastroesophageal reflux disease, unspecified whether esophagitis present - Doing well on protonix  8. Left thyroid nodule - Managed by ENT  9. Mixed hyperlipidemia - Lipid panel  10. Hx of smoking - Trelegy per orders  11. Stage 3 chronic kidney  disease, unspecified whether stage 3a or 3b CKD - We are stopping low dose losartan because she has been having lightheadedness.  Discussed that this would also remove some kidney protection, plan to recheck kidney function in 3 months to determine if need to place on 2.5mg  lisinopril. - COMPLETE METABOLIC PANEL WITH GFR  12. Uncomplicated opioid use - HYDROcodone-acetaminophen (NORCO) 10-325 MG tablet; Take 1 tablet in the morning and in the afternoon, take  1.5 tablets at night before bed.  Dispense: 105 tablet; Refill: 0 - HYDROcodone-acetaminophen (NORCO) 10-325 MG tablet; Take 1 tablet in the morning and in the afternoon, take 1.5 tablets at night before bed.  Dispense: 105 tablet; Refill: 0 - HYDROcodone-acetaminophen (NORCO) 10-325 MG tablet; Take 1 tablet in the morning and in the afternoon, take 1.5 tablets at night before bed.  Dispense: 105 tablet; Refill: 0 - Drugs of abuse screen w/o alc (for [place] OP)  13. Epistaxis - Humidifier; discussed if not able to stop the bleeding needs to go to ER.  Will discuss plavix Rx with Dr. Humphrey Rolls at upcoming appt.   14. Chronic rhinitis - triamcinolone (NASACORT) 55 MCG/ACT AERO nasal inhaler; Place 2 sprays into the nose daily.  Dispense: 1 Inhaler; Refill: 12  I discussed the assessment and treatment plan with the patient. The patient was provided an opportunity to ask questions and all were answered. The patient agreed with the plan and demonstrated an understanding of the instructions.   The patient was advised to call back or seek an in-person evaluation if the symptoms worsen or if the condition fails to improve as anticipated.  I provided 23 minutes of non-face-to-face time during this encounter.  Hubbard Hartshorn, FNP

## 2019-11-25 ENCOUNTER — Encounter: Payer: Self-pay | Admitting: Family Medicine

## 2019-11-27 LAB — LIPID PANEL
Cholesterol: 143 mg/dL (ref <200)
HDL: 69 mg/dL (ref >=50)
LDL Cholesterol (Calc): 58 mg/dL (calc)
Non-HDL Cholesterol (Calc): 74 mg/dL (calc) (ref <130)
Total CHOL/HDL Ratio: 2.1 (calc) (ref <5.0)
Triglycerides: 81 mg/dL (ref <150)

## 2019-11-27 LAB — PAIN MGMT, PROFILE 1 CONF W/O MM, U
Amphetamines: NEGATIVE ng/mL
Barbiturates: NEGATIVE ng/mL
Benzodiazepines: NEGATIVE ng/mL
Cocaine Metabolite: NEGATIVE ng/mL
Codeine: NEGATIVE ng/mL
Creatinine: 19.7 mg/dL
Hydrocodone: 420 ng/mL
Hydromorphone: 298 ng/mL
Marijuana Metabolite: NEGATIVE ng/mL
Methadone Metabolite: NEGATIVE ng/mL
Morphine: NEGATIVE ng/mL
Norhydrocodone: 526 ng/mL
Opiates: POSITIVE ng/mL
Oxidant: NEGATIVE ug/mL
Oxycodone: NEGATIVE ng/mL
Phencyclidine: NEGATIVE ng/mL
Specific Gravity: 1.005 (ref >=1.0)
pH: 7.4 (ref 4.5–9.0)

## 2019-11-27 LAB — COMPLETE METABOLIC PANEL WITH GFR
AG Ratio: 1.4 (calc) (ref 1.0–2.5)
ALT: 27 U/L (ref 6–29)
AST: 34 U/L (ref 10–35)
Albumin: 4.5 g/dL (ref 3.6–5.1)
Alkaline phosphatase (APISO): 81 U/L (ref 37–153)
BUN/Creatinine Ratio: 14 (calc) (ref 6–22)
BUN: 16 mg/dL (ref 7–25)
CO2: 30 mmol/L (ref 20–32)
Calcium: 10 mg/dL (ref 8.6–10.4)
Chloride: 103 mmol/L (ref 98–110)
Creat: 1.12 mg/dL — ABNORMAL HIGH (ref 0.60–0.88)
GFR, Est African American: 51 mL/min/{1.73_m2} — ABNORMAL LOW (ref >=60)
GFR, Est Non African American: 44 mL/min/{1.73_m2} — ABNORMAL LOW (ref >=60)
Globulin: 3.2 g/dL (calc) (ref 1.9–3.7)
Glucose, Bld: 99 mg/dL (ref 65–99)
Potassium: 5.5 mmol/L — ABNORMAL HIGH (ref 3.5–5.3)
Sodium: 141 mmol/L (ref 135–146)
Total Bilirubin: 0.9 mg/dL (ref 0.2–1.2)
Total Protein: 7.7 g/dL (ref 6.1–8.1)

## 2019-11-28 ENCOUNTER — Other Ambulatory Visit: Payer: Self-pay

## 2019-11-28 ENCOUNTER — Ambulatory Visit (INDEPENDENT_AMBULATORY_CARE_PROVIDER_SITE_OTHER): Payer: Medicare Other

## 2019-11-28 DIAGNOSIS — R040 Epistaxis: Secondary | ICD-10-CM

## 2019-11-28 DIAGNOSIS — I10 Essential (primary) hypertension: Secondary | ICD-10-CM | POA: Diagnosis not present

## 2019-11-28 NOTE — Chronic Care Management (AMB) (Signed)
Chronic Care Management   Follow Up Note   11/28/2019 Name: Samantha Dawson MRN: [id number] DOB: 1928-12-08  Primary Care Provider: Hubbard Hartshorn, FNP Reason for referral : Chronic Care Management   Samantha Dawson is a 86 y.o. year old female who is a primary care patient of Hubbard Hartshorn, FNP. She is currently engaged with the Chronic Care management team. A routine telephonic outreach was conducted today.  Review of Samantha Dawson's status, including review of consultants reports, relevant labs and test results was conducted today. Collaboration with appropriate care team members was performed as part of the comprehensive evaluation and provision of chronic care management services.    Outpatient Encounter Medications as of 11/28/2019  Medication Sig  . albuterol (PROAIR HFA) 108 (90 Base) MCG/ACT inhaler INHALE 2 PUFFS INTO THE LUNGS EVERY 4 HOURS AS NEEDED FOR WHEEZING OR SHORTNESS OF BREATH; FUTURE REFILLS FROM LUNG DOCTOR  . aspirin 81 MG tablet Take 81 mg by mouth daily.  . clopidogrel (PLAVIX) 75 MG tablet Take 1 tablet (75 mg total) by mouth daily.  Marland Kitchen co-enzyme Q-10 30 MG capsule Take 100 mg by mouth daily.   . fluorouracil (EFUDEX) 5 % cream   . fluticasone (FLONASE) 50 MCG/ACT nasal spray Place 2 sprays into both nostrils as needed.   . Fluticasone-Umeclidin-Vilant (TRELEGY ELLIPTA) 100-62.5-25 MCG/INH AEPB Inhale 1 puff into the lungs daily.  Derrill Memo ON 12/06/2019] HYDROcodone-acetaminophen (NORCO) 10-325 MG tablet Take 1 tablet in the morning and in the afternoon, take 1.5 tablets at night before bed.  Derrill Memo ON 01/05/2020] HYDROcodone-acetaminophen (NORCO) 10-325 MG tablet Take 1 tablet in the morning and in the afternoon, take 1.5 tablets at night before bed.  Derrill Memo ON 03/05/2020] HYDROcodone-acetaminophen (NORCO) 10-325 MG tablet Take 1 tablet in the morning and in the afternoon, take 1.5 tablets at night before bed.  . metoprolol succinate (TOPROL-XL) 50 MG 24 hr tablet  TAKE 1 TABLET BY MOUTH EVERY DAY. TAKE WITH OR IMMEDIATELY FOLLOWING A MEAL  . pantoprazole (PROTONIX) 20 MG tablet Take 1 tablet (20 mg total) by mouth 2 (two) times daily. Caution:prolonged use may increase risk of pneumonia, colitis, osteoporosis, anemia  . rosuvastatin (CRESTOR) 10 MG tablet Take 10 mg by mouth daily.  Marland Kitchen triamcinolone (NASACORT) 55 MCG/ACT AERO nasal inhaler Place 2 sprays into the nose daily.   No facility-administered encounter medications on file as of 11/28/2019.      Goals Addressed            This Visit's Progress   . Chronic Disease Management       Current Barriers:  . Chronic Disease Management support and education needs related to Hypertension, Hyperlipidemia and Chronic Kidney Disease. Hx of osteoarthritis and degenerative disc disease.   Case Manager Clinical Goal(s):  Marland Kitchen Over the next 90 days, patient will attend all medical appointments. . Over the next 90 days, patient will take all medications as prescribed. . Over the next 90 days, patient will monitor blood pressure and record readings. . Over the next 90 days, patient will adhere to recommended cardiac prudent/heart healthy diet. . Over the next 90 days, patient will adhere to recommended safety and fall prevention measures.  Interventions:  . Reviewed medications and discussed indications for use. Encouraged to take all medications as prescribed. Encouraged to inform provider if unable to tolerate the prescribed regimen. Encouraged to contact care management team with concerns regarding medication management or prescription costs. Reports nosebleeds over the past week.  Currently holding aspirin and Plavix as directed by provider.  . Provided education regarding s/sx of heart attack and stroke, DASH diet and complications of uncontrolled blood pressure. Discussed established BP parameters and indications for contacting provider. Encouraged to monitor BP daily and record readings. Reports monitoring  closely due to nosebleeds. Verbalized knowledge of s/sx that require immediate medical attention. Reports reading today was 114/61.  Marland Kitchen Discussed nutritional intake. Encouraged to continue adherence with recommended cardiac prudent/heart healthy diet. . Provided education regarding home safety measures and fall prevention. Encouraged to utilize assistive devices when ambulating. . Reviewed schedule and pending appointments. Encouraged to attend all appointments as scheduled to prevent delays in care. Encouraged to notify care management team with concerns regarding transportation. Denies concerns regarding transportation. Reports daughter [name] is available to assist as needed. Reports pending outreach with Cardiologist to discuss medication plan. . Discussed plans for ongoing care management and follow up. Provided direct contact information for care management team.    Patient Self Care Activities:  . Self administers medications  . Attends scheduled provider appointments . Performs ADL's independently . Calls provider office for new concerns or questions    Please see past updates related to this goal by clicking on the "Past Updates" button in the selected goal           PLAN The care management team will follow-up with Samantha Dawson within the next month. She was encouraged to call with any health related concerns if needed prior to the scheduled outreach.   [facility] [phone]

## 2019-12-08 ENCOUNTER — Other Ambulatory Visit: Payer: Self-pay | Admitting: Family Medicine

## 2020-01-02 ENCOUNTER — Ambulatory Visit (INDEPENDENT_AMBULATORY_CARE_PROVIDER_SITE_OTHER): Payer: Medicare Other

## 2020-01-02 ENCOUNTER — Other Ambulatory Visit: Payer: Self-pay

## 2020-01-02 DIAGNOSIS — I10 Essential (primary) hypertension: Secondary | ICD-10-CM | POA: Diagnosis not present

## 2020-01-02 NOTE — Chronic Care Management (AMB) (Signed)
Chronic Care Management   Follow Up Note   01/02/2020 Name: EXA CHOHAN MRN: [id number] DOB: 18-Feb-1929  Primary Care Provider: Hubbard Hartshorn, FNP Reason for referral : Chronic Care Management  NEETI SHAWVER is a 86 y.o. year old female who is a primary care patient of Hubbard Hartshorn, FNP. She is currently engaged with the chronic care management team. A routine outreach was conducted today.  Review of Ms. Penton's status, including review of consultants reports, relevant labs and test results was conducted today. Collaboration with appropriate care team members was performed as part of the comprehensive evaluation and provision of chronic care management services.    Outpatient Encounter Medications as of 01/02/2020  Medication Sig Note  . albuterol (PROAIR HFA) 108 (90 Base) MCG/ACT inhaler INHALE 2 PUFFS INTO THE LUNGS EVERY 4 HOURS AS NEEDED FOR WHEEZING OR SHORTNESS OF BREATH; FUTURE REFILLS FROM LUNG DOCTOR   . aspirin 81 MG tablet Take 81 mg by mouth daily.   . clopidogrel (PLAVIX) 75 MG tablet Take 1 tablet (75 mg total) by mouth daily. (Patient not taking: Reported on 01/02/2020) 01/02/2020: Reports not taking due to nose bleeds.  Marland Kitchen co-enzyme Q-10 30 MG capsule Take 100 mg by mouth daily.    . fluorouracil (EFUDEX) 5 % cream    . fluticasone (FLONASE) 50 MCG/ACT nasal spray Place 2 sprays into both nostrils as needed.    . Fluticasone-Umeclidin-Vilant (TRELEGY ELLIPTA) 100-62.5-25 MCG/INH AEPB Inhale 1 puff into the lungs daily.   Marland Kitchen HYDROcodone-acetaminophen (NORCO) 10-325 MG tablet Take 1 tablet in the morning and in the afternoon, take 1.5 tablets at night before bed.   Derrill Memo ON 01/05/2020] HYDROcodone-acetaminophen (NORCO) 10-325 MG tablet Take 1 tablet in the morning and in the afternoon, take 1.5 tablets at night before bed.   Derrill Memo ON 03/05/2020] HYDROcodone-acetaminophen (NORCO) 10-325 MG tablet Take 1 tablet in the morning and in the afternoon, take 1.5 tablets at  night before bed.   . metoprolol succinate (TOPROL-XL) 50 MG 24 hr tablet TAKE 1 TABLET BY MOUTH EVERY DAY. TAKE WITH OR IMMEDIATELY FOLLOWING A MEAL   . pantoprazole (PROTONIX) 20 MG tablet Take 1 tablet (20 mg total) by mouth 2 (two) times daily. Caution:prolonged use may increase risk of pneumonia, colitis, osteoporosis, anemia   . rosuvastatin (CRESTOR) 10 MG tablet Take 10 mg by mouth daily.   Marland Kitchen triamcinolone (NASACORT) 55 MCG/ACT AERO nasal inhaler Place 2 sprays into the nose daily.    No facility-administered encounter medications on file as of 01/02/2020.     Goals Addressed            This Visit's Progress   . Chronic Disease Management       Current Barriers:  . Chronic Disease Management support and education needs related to Hypertension, Hyperlipidemia and Chronic Kidney Disease. Hx of osteoarthritis and degenerative disc disease.   Case Manager Clinical Goal(s):  Marland Kitchen Over the next 90 days, patient will attend all medical appointments. . Over the next 90 days, patient will take all medications as prescribed. . Over the next 90 days, patient will monitor blood pressure and record readings. . Over the next 90 days, patient will adhere to recommended cardiac prudent/heart healthy diet. . Over the next 90 days, patient will adhere to recommended safety and fall prevention measures.  Interventions:  . Reviewed medications and discussed indications for use. Encouraged to take all medications as prescribed. Encouraged to inform provider if unable to tolerate the  prescribed regimen. Encouraged to contact care management team with concerns regarding medication management or prescription costs. Confirms discontinuing Plavix as instructed by Cardiologist. . Provided education regarding s/sx of heart attack and stroke, DASH diet and complications of uncontrolled blood pressure. Discussed established BP parameters and indications for contacting provider. Encouraged to monitor BP daily and  record readings. Reports not monitoring today prior to call but states readings have been within range.  . Discussed nutritional intake. Encouraged to continue adherence with recommended cardiac prudent/heart healthy diet. . Provided education regarding home safety measures and fall prevention. Encouraged to utilize assistive devices when ambulating. . Reviewed schedule and pending appointments. Encouraged to attend all appointments as scheduled to prevent delays in care. Encouraged to notify care management team with concerns regarding transportation.  . Discussed plans for ongoing care management and follow up. Provided direct contact information for care management team.    Patient Self Care Activities:  . Self administers medications  . Attends scheduled provider appointments . Performs ADL's independently . Calls provider office for new concerns or questions    Please see past updates related to this goal by clicking on the "Past Updates" button in the selected goal         PLAN The care management team will follow up with Ms. Lyster next month.   [facility] [phone]

## 2020-01-07 ENCOUNTER — Other Ambulatory Visit: Payer: Self-pay | Admitting: Family Medicine

## 2020-01-07 DIAGNOSIS — I10 Essential (primary) hypertension: Secondary | ICD-10-CM

## 2020-01-07 NOTE — Telephone Encounter (Signed)
Patient stated she was on automatic refill and they kept sending prescription to her. Refusal sent back to pharmacy

## 2020-01-07 NOTE — Telephone Encounter (Signed)
Requested medication (s) are due for refill today: yes  Requested medication (s) are on the active medication list: no  Last refill:  12/08/19  Future visit scheduled: no   Notes to clinic: previously d/c'd    Requested Prescriptions  Pending Prescriptions Disp Refills   [place] 9-4.8 MCG/ACT AERO [Pharmacy Med Name: BEVESPI 9-4.8MCG ORAL INH 120INH] 10.7 g     Sig: INHALE 2 PUFFS BY MOUTH TWICE DAILY      Off-Protocol Failed - 01/07/2020  8:35 AM      Failed - Medication not assigned to a protocol, review manually.      Passed - Valid encounter within last 12 months    Recent Outpatient Visits           1 month ago Essential hypertension   [place], FNP   4 months ago Chronic pain syndrome   [place], FNP   7 months ago Degenerative disc disease, thoracic   [place], FNP   10 months ago Panlobular emphysema [facility])   [place], Satira Anis, MD   1 year ago Chronic pain syndrome   [place], Satira Anis, MD       Future Appointments             In 1 month Delsa Grana, PA-C [facility], [facility]

## 2020-02-01 NOTE — Patient Instructions (Signed)
Thank you for allowing the Chronic Care Management team to participate in your care.  Goals Addressed            This Visit's Progress   . Chronic Disease Management       Current Barriers:  . Chronic Disease Management support and education needs related to Hypertension, Hyperlipidemia and Chronic Kidney Disease. Hx of osteoarthritis and degenerative disc disease.   Case Manager Clinical Goal(s):  Marland Kitchen Over the next 90 days, patient will attend all medical appointments. . Over the next 90 days, patient will take all medications as prescribed. . Over the next 90 days, patient will monitor blood pressure and record readings. . Over the next 90 days, patient will adhere to recommended cardiac prudent/heart healthy diet. . Over the next 90 days, patient will adhere to recommended safety and fall prevention measures.  Interventions:  . Reviewed medications and discussed indications for use. Encouraged to take all medications as prescribed. Encouraged to inform provider if unable to tolerate the prescribed regimen. Encouraged to contact care management team with concerns regarding medication management or prescription costs. Confirms discontinuing Plavix as instructed by Cardiologist. . Provided education regarding s/sx of heart attack and stroke, DASH diet and complications of uncontrolled blood pressure. Discussed established BP parameters and indications for contacting provider. Encouraged to monitor BP daily and record readings. Reports not monitoring today prior to call but states readings have been within range.  . Discussed nutritional intake. Encouraged to continue adherence with recommended cardiac prudent/heart healthy diet. . Provided education regarding home safety measures and fall prevention. Encouraged to utilize assistive devices when ambulating. . Reviewed schedule and pending appointments. Encouraged to attend all appointments as scheduled to prevent delays in care. Encouraged to  notify care management team with concerns regarding transportation.  . Discussed plans for ongoing care management and follow up. Provided direct contact information for care management team.    Patient Self Care Activities:  . Self administers medications  . Attends scheduled provider appointments . Performs ADL's independently . Calls provider office for new concerns or questions    Please see past updates related to this goal by clicking on the "Past Updates" button in the selected goal          Ms. Wakeman verbalized understanding of the instructions provided during the telephonic outreach today. Declined need for a printed/mailed copy of the instructions.   The care management team will follow up with Ms. Hirschfeld next month.    [facility] [phone]

## 2020-02-01 NOTE — Patient Instructions (Addendum)
Thank you for allowing the Chronic Care Management team to participate in your care.   Goals Addressed            This Visit's Progress   . Chronic Disease Management       Current Barriers:  . Chronic Disease Management support and education needs related to Hypertension, Hyperlipidemia and Chronic Kidney Disease. Hx of osteoarthritis and degenerative disc disease.   Case Manager Clinical Goal(s):  Marland Kitchen Over the next 90 days, patient will attend all medical appointments. . Over the next 90 days, patient will take all medications as prescribed. . Over the next 90 days, patient will monitor blood pressure and record readings. . Over the next 90 days, patient will adhere to recommended cardiac prudent/heart healthy diet. . Over the next 90 days, patient will adhere to recommended safety and fall prevention measures.  Interventions:  . Reviewed medications and discussed indications for use. Encouraged to take all medications as prescribed. Encouraged to inform provider if unable to tolerate the prescribed regimen. Encouraged to contact care management team with concerns regarding medication management or prescription costs. . Provided education regarding s/sx of heart attack and stroke, DASH diet and complications of uncontrolled blood pressure. Discussed established BP parameters and indications for contacting provider. Encouraged to monitor BP daily and record readings. Reports current systolic range from 208-022. Reports diastolic range in the 33'K and 70's.  . Discussed nutritional intake. Encouraged to continue adherence with recommended cardiac prudent/heart healthy diet. . Provided education regarding home safety measures and fall prevention. Encouraged to utilize assistive devices when ambulating. . Reviewed schedule and pending appointments. Encouraged to attend all appointments as scheduled to prevent delays in care. Encouraged to notify care management team with concerns regarding  transportation. . Discussed plans for ongoing care management and follow up. Provided direct contact information for care management team.    Patient Self Care Activities:  . Self administers medications  . Attends scheduled provider appointments . Performs ADL's independently . Calls provider office for new concerns or questions    Initial goal documentation        Samantha Dawson was given information about Chronic Care Management services including:  1. CCM service includes personalized support from designated clinical staff supervised by her physician, including individualized plan of care and coordination with other care providers 2. 24/7 contact phone numbers for assistance for urgent and routine care needs. 3. Service will only be billed when office clinical staff spend 20 minutes or more in a month to coordinate care. 4. Only one practitioner may furnish and bill the service in a calendar month. 5. The patient may stop CCM services at any time (effective at the end of the month) by phone call to the office staff. 6. The patient will be responsible for cost sharing (co-pay) of up to 20% of the service fee (after annual deductible is met).   Patient agreed to services. Verbal consent obtained.     Samantha Dawson verbalized understanding of the instructions provided during the telephonic outreach today. She declined need for a printed/mailed copy of the instructions.    The care management team will follow-up with Samantha Dawson next month.   [facility] [phone]

## 2020-02-01 NOTE — Patient Instructions (Signed)
Thank you for allowing the Chronic Care Management team to participate in your care.  Goals Addressed            This Visit's Progress   . Chronic Disease Management       Current Barriers:  . Chronic Disease Management support and education needs related to Hypertension, Hyperlipidemia and Chronic Kidney Disease. Hx of osteoarthritis and degenerative disc disease.   Case Manager Clinical Goal(s):  Marland Kitchen Over the next 90 days, patient will attend all medical appointments. . Over the next 90 days, patient will take all medications as prescribed. . Over the next 90 days, patient will monitor blood pressure and record readings. . Over the next 90 days, patient will adhere to recommended cardiac prudent/heart healthy diet. . Over the next 90 days, patient will adhere to recommended safety and fall prevention measures.  Interventions:  . Reviewed medications and discussed indications for use. Encouraged to take all medications as prescribed. Encouraged to inform provider if unable to tolerate the prescribed regimen. Encouraged to contact care management team with concerns regarding medication management or prescription costs. Reports nosebleeds over the past week. Currently holding aspirin and Plavix as directed by provider.  . Provided education regarding s/sx of heart attack and stroke, DASH diet and complications of uncontrolled blood pressure. Discussed established BP parameters and indications for contacting provider. Encouraged to monitor BP daily and record readings. Reports monitoring closely due to nosebleeds. Verbalized knowledge of s/sx that require immediate medical attention. Reports reading today was 114/61.  Marland Kitchen Discussed nutritional intake. Encouraged to continue adherence with recommended cardiac prudent/heart healthy diet. . Provided education regarding home safety measures and fall prevention. Encouraged to utilize assistive devices when ambulating. . Reviewed schedule and pending  appointments. Encouraged to attend all appointments as scheduled to prevent delays in care. Encouraged to notify care management team with concerns regarding transportation. Denies concerns regarding transportation. Reports daughter [name] is available to assist as needed. Reports pending outreach with Cardiologist to discuss medication plan. . Discussed plans for ongoing care management and follow up. Provided direct contact information for care management team.    Patient Self Care Activities:  . Self administers medications  . Attends scheduled provider appointments . Performs ADL's independently . Calls provider office for new concerns or questions    Please see past updates related to this goal by clicking on the "Past Updates" button in the selected goal         Ms. Hsia verbalized understanding of the instructions provided during the telephonic outreach today. She declined need for a mailed/printed copy of the instructions.   The care management team will follow-up with Ms. Quigg within the next month. She was encouraged to call with any health related concerns if needed prior to the scheduled outreach.   [facility] [phone]

## 2020-02-02 ENCOUNTER — Ambulatory Visit (INDEPENDENT_AMBULATORY_CARE_PROVIDER_SITE_OTHER): Payer: Medicare Other

## 2020-02-02 ENCOUNTER — Other Ambulatory Visit: Payer: Self-pay

## 2020-02-02 DIAGNOSIS — I10 Essential (primary) hypertension: Secondary | ICD-10-CM

## 2020-02-02 NOTE — Patient Instructions (Signed)
Thank you for allowing the Chronic Care Management team to participate in your care.   Goals Addressed            This Visit's Progress   . Chronic Disease Management       Current Barriers:  . Chronic Disease Management support and education needs related to Hypertension, Hyperlipidemia and Chronic Kidney Disease. Hx of osteoarthritis and degenerative disc disease.   Case Manager Clinical Goal(s):  Marland Kitchen Over the next 90 days, patient will attend all medical appointments. . Over the next 90 days, patient will take all medications as prescribed. . Over the next 90 days, patient will monitor blood pressure and record readings. . Over the next 90 days, patient will adhere to recommended cardiac prudent/heart healthy diet. . Over the next 90 days, patient will adhere to recommended safety and fall prevention measures.  Interventions:  . Reviewed medications and discussed indications for use. Encouraged to take all medications as prescribed. Encouraged to inform provider if unable to tolerate the prescribed regimen. Encouraged to contact care management team with concerns regarding medication management or prescription costs.  . Provided education regarding s/sx of heart attack and stroke, DASH diet and complications of uncontrolled blood pressure. Discussed established BP parameters and indications for contacting provider. Encouraged to monitor BP daily and record readings. Reports monitoring every two to three days. Reports systolic range of Q000111Q. Reports diastolic range of Q000111Q. Marland Kitchen Discussed nutritional intake. Encouraged to continue adherence with recommended cardiac prudent/heart healthy diet. Reports good nutritional intake. Denies changes or loss of appetite. . Provided education regarding home safety measures and fall prevention. Encouraged to utilize assistive devices when ambulating. . Reviewed schedule and pending appointments. Encouraged to attend all appointments as scheduled to  prevent delays in care. Encouraged to notify care management team with concerns regarding transportation. Pending primary care appointment on 02/23/20. Reports pending follow-up with Cardiologist  in May. . Discussed plans for ongoing care management and follow up. Provided direct contact information for care management team.    Patient Self Care Activities:  . Self administers medications  . Attends scheduled provider appointments . Performs ADL's independently . Calls provider office for new concerns or questions    Please see past updates related to this goal by clicking on the "Past Updates" button in the selected goal         Ms. Rabinovich verbalized understanding of the instructions provided during the telephonic outreach today. She declined need for a mailed/printed copy of the instructions.    The care management team will follow up with Ms. Amburn next month. She was encouraged to contact the team with any health related concerns or if needed prior to the scheduled outreach.   [facility] [phone]

## 2020-02-02 NOTE — Chronic Care Management (AMB) (Signed)
Chronic Care Management   Follow Up Note   02/02/2020 Name: Samantha Dawson MRN: [id number] DOB: Feb 26, 1929  Primary Care Provider: Hubbard Hartshorn, FNP Reason for referral : Chronic Care Management   Samantha Dawson is a 86 y.o. year old female who is a primary care patient of Hubbard Hartshorn, FNP. She is currently engaged with the care management team. A routine telephonic outreach was conducted today. Overall, Samantha Dawson reports doing well. She reports increased energy and activity tolerance since the recent change in season.  Review of Samantha. Dawson's status, including review of consultants reports, relevant labs and test results was conducted today. Collaboration with appropriate care team members was performed as part of the comprehensive evaluation and provision of chronic care management services.    SDOH (Social Determinants of Health) assessments performed: Yes See Care Plan activities for detailed interventions related to [facility])     Outpatient Encounter Medications as of 02/02/2020  Medication Sig Note  . albuterol (PROAIR HFA) 108 (90 Base) MCG/ACT inhaler INHALE 2 PUFFS INTO THE LUNGS EVERY 4 HOURS AS NEEDED FOR WHEEZING OR SHORTNESS OF BREATH; FUTURE REFILLS FROM LUNG DOCTOR   . aspirin 81 MG tablet Take 81 mg by mouth daily.   . clopidogrel (PLAVIX) 75 MG tablet Take 1 tablet (75 mg total) by mouth daily. (Patient not taking: Reported on 01/02/2020) 01/02/2020: Reports not taking due to nose bleeds.  Marland Kitchen co-enzyme Q-10 30 MG capsule Take 100 mg by mouth daily.    . fluorouracil (EFUDEX) 5 % cream    . fluticasone (FLONASE) 50 MCG/ACT nasal spray Place 2 sprays into both nostrils as needed.    . Fluticasone-Umeclidin-Vilant (TRELEGY ELLIPTA) 100-62.5-25 MCG/INH AEPB Inhale 1 puff into the lungs daily.   Marland Kitchen HYDROcodone-acetaminophen (NORCO) 10-325 MG tablet Take 1 tablet in the morning and in the afternoon, take 1.5 tablets at night before bed.   Derrill Memo ON 03/05/2020]  HYDROcodone-acetaminophen (NORCO) 10-325 MG tablet Take 1 tablet in the morning and in the afternoon, take 1.5 tablets at night before bed.   . metoprolol succinate (TOPROL-XL) 50 MG 24 hr tablet TAKE 1 TABLET EVERY DAY WITH OR IMMEDIATELY AFTER A MEAL   . pantoprazole (PROTONIX) 20 MG tablet Take 1 tablet (20 mg total) by mouth 2 (two) times daily. Caution:prolonged use may increase risk of pneumonia, colitis, osteoporosis, anemia   . rosuvastatin (CRESTOR) 10 MG tablet Take 10 mg by mouth daily.   Marland Kitchen triamcinolone (NASACORT) 55 MCG/ACT AERO nasal inhaler Place 2 sprays into the nose daily.    No facility-administered encounter medications on file as of 02/02/2020.     Objective:  BP Readings from Last 3 Encounters:  11/24/19 114/61  11/25/18 126/74  08/27/18 126/80    Lab Results  Component Value Date   CHOL 143 11/25/2019   HDL 69 11/25/2019   LDLCALC 58 11/25/2019   TRIG 81 11/25/2019   CHOLHDL 2.1 11/25/2019    Fall Risk  02/02/2020 11/28/2019 11/24/2019 10/27/2019 09/02/2019  Falls in the past year? 0 0 0 0 0  Number falls in past yr: - - 0 - 0  Injury with Fall? - - 0 - 0  Comment - - - - -  Risk Factor Category  - - - - -  Risk for fall due to : Medication side effect;Other (Comment) Medication side effect - Medication side effect -  Risk for fall due to: Comment Osteoarthritis/ Degenerative disc disease - - - -  Follow up  Falls prevention discussed Falls prevention discussed - Falls prevention discussed Falls evaluation completed    Depression screen [facility] 2/9 02/02/2020 11/24/2019 10/27/2019  Decreased Interest 0 1 0  Down, Depressed, Hopeless 0 1 0  PHQ - 2 Score 0 2 0  Altered sleeping - 1 -  Tired, decreased energy - 1 -  Change in appetite - 0 -  Feeling bad or failure about yourself  - 0 -  Trouble concentrating - 0 -  Moving slowly or fidgety/restless - 0 -  Suicidal thoughts - 0 -  PHQ-9 Score - 4 -  Difficult doing work/chores - Not difficult at all -  Some  recent data might be hidden     Goals Addressed            This Visit's Progress   . Chronic Disease Management       Current Barriers:  . Chronic Disease Management support and education needs related to Hypertension, Hyperlipidemia and Chronic Kidney Disease. Hx of osteoarthritis and degenerative disc disease.   Case Manager Clinical Goal(s):  Marland Kitchen Over the next 90 days, patient will attend all medical appointments. . Over the next 90 days, patient will take all medications as prescribed. . Over the next 90 days, patient will monitor blood pressure and record readings. . Over the next 90 days, patient will adhere to recommended cardiac prudent/heart healthy diet. . Over the next 90 days, patient will adhere to recommended safety and fall prevention measures.  Interventions:  . Reviewed medications and discussed indications for use. Encouraged to take all medications as prescribed. Encouraged to inform provider if unable to tolerate the prescribed regimen. Encouraged to contact care management team with concerns regarding medication management or prescription costs.  . Provided education regarding s/sx of heart attack and stroke, DASH diet and complications of uncontrolled blood pressure. Discussed established BP parameters and indications for contacting provider. Encouraged to monitor BP daily and record readings. Reports monitoring every two to three days. Reports systolic range of Q000111Q. Reports diastolic range of Q000111Q. Marland Kitchen Discussed nutritional intake. Encouraged to continue adherence with recommended cardiac prudent/heart healthy diet. Reports good nutritional intake. Denies changes or loss of appetite. . Provided education regarding home safety measures and fall prevention. Encouraged to utilize assistive devices when ambulating. . Reviewed schedule and pending appointments. Encouraged to attend all appointments as scheduled to prevent delays in care. Encouraged to notify care  management team with concerns regarding transportation. Pending primary care appointment on 02/23/20. Reports pending follow-up with Cardiologist  in May. . Discussed plans for ongoing care management and follow up. Provided direct contact information for care management team.    Patient Self Care Activities:  . Self administers medications  . Attends scheduled provider appointments . Performs ADL's independently . Calls provider office for new concerns or questions    Please see past updates related to this goal by clicking on the "Past Updates" button in the selected goal          PLAN The care management team will follow up with Samantha. Heckendorn next month. She was encouraged to contact the care management team with any health related concerns if needed prior to the next scheduled outreach.    [facility] Care Management [phone]

## 2020-02-06 ENCOUNTER — Telehealth: Payer: Self-pay | Admitting: General Practice

## 2020-02-06 NOTE — Telephone Encounter (Signed)
Pt called about the start date on her refill below being incorrect/ please advise so Pt can get medication from pharmacy

## 2020-02-06 NOTE — Telephone Encounter (Signed)
RX REFILL HYDROcodone-acetaminophen (NORCO) 10-325 MG tablet  Patient states this refill should be refilled on the 4/1.   [place] [id number], [place] [address] [place] Phone:  [phone]  Fax:  [phone]

## 2020-02-09 NOTE — Telephone Encounter (Signed)
Pt called again about the start date on her Hydrocodone/ please call Pt and advise when corrected so she can pick up meds

## 2020-02-10 ENCOUNTER — Ambulatory Visit (INDEPENDENT_AMBULATORY_CARE_PROVIDER_SITE_OTHER): Payer: Medicare Other

## 2020-02-10 VITALS — Ht 60.0 in | Wt 115.0 lb

## 2020-02-10 DIAGNOSIS — Z0000 Encounter for general adult medical examination without abnormal findings: Secondary | ICD-10-CM

## 2020-02-10 NOTE — Patient Instructions (Signed)
Samantha Dawson , Thank you for taking time to come for your Medicare Wellness Visit. I appreciate your ongoing commitment to your health goals. Please review the following plan we discussed and let me know if I can assist you in the future.   Screening recommendations/referrals: Colonoscopy: no longer required Mammogram: no longer required Bone Density: done 07/15/18 Recommended yearly ophthalmology/optometry visit for glaucoma screening and checkup Recommended yearly dental visit for hygiene and checkup  Vaccinations: Influenza vaccine: done 08/30/19 Pneumococcal vaccine: done 05/26/14 Tdap vaccine: done 09/09/12 Shingles vaccine: Shingrix discussed. Please contact your pharmacy for coverage information.     Advanced directives: Please bring a copy of your health care power of attorney and living will to the office at your convenience.  Conditions/risks identified: Recommend increasing physical activity as tolerated  Next appointment: Please follow up in one year for your Medicare Annual Wellness visit.     Preventive Care 29 Years and Older, Female Preventive care refers to lifestyle choices and visits with your health care provider that can promote health and wellness. What does preventive care include?  A yearly physical exam. This is also called an annual well check.  Dental exams once or twice a year.  Routine eye exams. Ask your health care provider how often you should have your eyes checked.  Personal lifestyle choices, including:  Daily care of your teeth and gums.  Regular physical activity.  Eating a healthy diet.  Avoiding tobacco and drug use.  Limiting alcohol use.  Practicing safe sex.  Taking low-dose aspirin every day.  Taking vitamin and mineral supplements as recommended by your health care provider. What happens during an annual well check? The services and screenings done by your health care provider during your annual well check will depend on your  age, overall health, lifestyle risk factors, and family history of disease. Counseling  Your health care provider may ask you questions about your:  Alcohol use.  Tobacco use.  Drug use.  Emotional well-being.  Home and relationship well-being.  Sexual activity.  Eating habits.  History of falls.  Memory and ability to understand (cognition).  Work and work Statistician.  Reproductive health. Screening  You may have the following tests or measurements:  Height, weight, and BMI.  Blood pressure.  Lipid and cholesterol levels. These may be checked every 5 years, or more frequently if you are over 61 years old.  Skin check.  Lung cancer screening. You may have this screening every year starting at age 57 if you have a 30-pack-year history of smoking and currently smoke or have quit within the past 15 years.  Fecal occult blood test (FOBT) of the stool. You may have this test every year starting at age 59.  Flexible sigmoidoscopy or colonoscopy. You may have a sigmoidoscopy every 5 years or a colonoscopy every 10 years starting at age 70.  Hepatitis C blood test.  Hepatitis B blood test.  Sexually transmitted disease (STD) testing.  Diabetes screening. This is done by checking your blood sugar (glucose) after you have not eaten for a while (fasting). You may have this done every 1-3 years.  Bone density scan. This is done to screen for osteoporosis. You may have this done starting at age 27.  Mammogram. This may be done every 1-2 years. Talk to your health care provider about how often you should have regular mammograms. Talk with your health care provider about your test results, treatment options, and if necessary, the need for more tests. Vaccines  Your health care provider may recommend certain vaccines, such as:  Influenza vaccine. This is recommended every year.  Tetanus, diphtheria, and acellular pertussis (Tdap, Td) vaccine. You may need a Td booster  every 10 years.  Zoster vaccine. You may need this after age 31.  Pneumococcal 13-valent conjugate (PCV13) vaccine. One dose is recommended after age 47.  Pneumococcal polysaccharide (PPSV23) vaccine. One dose is recommended after age 60. Talk to your health care provider about which screenings and vaccines you need and how often you need them. This information is not intended to replace advice given to you by your health care provider. Make sure you discuss any questions you have with your health care provider. Document Released: 11/19/2015 Document Revised: 07/12/2016 Document Reviewed: 08/24/2015 Elsevier Interactive Patient Education  2017 [place] Prevention in the Home Falls can cause injuries. They can happen to people of all ages. There are many things you can do to make your home safe and to help prevent falls. What can I do on the outside of my home?  Regularly fix the edges of walkways and driveways and fix any cracks.  Remove anything that might make you trip as you walk through a door, such as a raised step or threshold.  Trim any bushes or trees on the path to your home.  Use bright outdoor lighting.  Clear any walking paths of anything that might make someone trip, such as rocks or tools.  Regularly check to see if handrails are loose or broken. Make sure that both sides of any steps have handrails.  Any raised decks and porches should have guardrails on the edges.  Have any leaves, snow, or ice cleared regularly.  Use sand or salt on walking paths during winter.  Clean up any spills in your garage right away. This includes oil or grease spills. What can I do in the bathroom?  Use night lights.  Install grab bars by the toilet and in the tub and shower. Do not use towel bars as grab bars.  Use non-skid mats or decals in the tub or shower.  If you need to sit down in the shower, use a plastic, non-slip stool.  Keep the floor dry. Clean up any  water that spills on the floor as soon as it happens.  Remove soap buildup in the tub or shower regularly.  Attach bath mats securely with double-sided non-slip rug tape.  Do not have throw rugs and other things on the floor that can make you trip. What can I do in the bedroom?  Use night lights.  Make sure that you have a light by your bed that is easy to reach.  Do not use any sheets or blankets that are too big for your bed. They should not hang down onto the floor.  Have a firm chair that has side arms. You can use this for support while you get dressed.  Do not have throw rugs and other things on the floor that can make you trip. What can I do in the kitchen?  Clean up any spills right away.  Avoid walking on wet floors.  Keep items that you use a lot in easy-to-reach places.  If you need to reach something above you, use a strong step stool that has a grab bar.  Keep electrical cords out of the way.  Do not use floor polish or wax that makes floors slippery. If you must use wax, use non-skid floor wax.  Do  not have throw rugs and other things on the floor that can make you trip. What can I do with my stairs?  Do not leave any items on the stairs.  Make sure that there are handrails on both sides of the stairs and use them. Fix handrails that are broken or loose. Make sure that handrails are as long as the stairways.  Check any carpeting to make sure that it is firmly attached to the stairs. Fix any carpet that is loose or worn.  Avoid having throw rugs at the top or bottom of the stairs. If you do have throw rugs, attach them to the floor with carpet tape.  Make sure that you have a light switch at the top of the stairs and the bottom of the stairs. If you do not have them, ask someone to add them for you. What else can I do to help prevent falls?  Wear shoes that:  Do not have high heels.  Have rubber bottoms.  Are comfortable and fit you well.  Are closed  at the toe. Do not wear sandals.  If you use a stepladder:  Make sure that it is fully opened. Do not climb a closed stepladder.  Make sure that both sides of the stepladder are locked into place.  Ask someone to hold it for you, if possible.  Clearly mark and make sure that you can see:  Any grab bars or handrails.  First and last steps.  Where the edge of each step is.  Use tools that help you move around (mobility aids) if they are needed. These include:  Canes.  Walkers.  Scooters.  Crutches.  Turn on the lights when you go into a dark area. Replace any light bulbs as soon as they burn out.  Set up your furniture so you have a clear path. Avoid moving your furniture around.  If any of your floors are uneven, fix them.  If there are any pets around you, be aware of where they are.  Review your medicines with your doctor. Some medicines can make you feel dizzy. This can increase your chance of falling. Ask your doctor what other things that you can do to help prevent falls. This information is not intended to replace advice given to you by your health care provider. Make sure you discuss any questions you have with your health care provider. Document Released: 08/19/2009 Document Revised: 03/30/2016 Document Reviewed: 11/27/2014 Elsevier Interactive Patient Education  2017 [employer].

## 2020-02-10 NOTE — Telephone Encounter (Signed)
Per Dr. Ancil Boozer the medication was sent in for the wrong date and that the pharmacy will contacted today so that patient can get medication filled.  Dr. Ancil Boozer also stated that patient will need to wean down on the medication by starting to take 2.5 tablets per day instead of 3.5 and if patient wouldn't start wean off then a referral would have to be placed to pain management.  I spoke with patient and explained the above information to her.  Patient stated that she would definitely work on weaning down to 2.5 tablets a day instead of 3.5.  Patient stated that she would like a call back once the pharmacy has been contacted so that she knows when she can pick up the medication.

## 2020-02-10 NOTE — Progress Notes (Signed)
Subjective:   Samantha Dawson is a 86 y.o. female who presents for Medicare Annual (Subsequent) preventive examination.  Virtual Visit via Telephone Note  I connected with Samantha Dawson on 02/10/20 at 10:40 AM EDT by telephone and verified that I am speaking with the correct person using two identifiers.  Medicare Annual Wellness visit completed telephonically due to Covid-19 pandemic.   Location: Patient: home Provider: office   I discussed the limitations, risks, security and privacy concerns of performing an evaluation and management service by telephone and the availability of in person appointments. The patient expressed understanding and agreed to proceed.  Some vital signs may be absent or patient reported.   Clemetine Marker, LPN    Review of Systems:   Cardiac Risk Factors include: advanced age (>59men, >17 women);dyslipidemia;hypertension     Objective:     Vitals: Ht 5' (1.524 m)    Wt 115 lb (52.2 kg)    BMI 22.46 kg/m   Body mass index is 22.46 kg/m.  Advanced Directives 02/10/2020 03/26/2018 08/29/2017 05/29/2017 02/27/2017 11/29/2016 10/09/2016  Does Patient Have a Medical Advance Directive? Yes Yes Yes Yes Yes Yes -  Type of Paramedic of [place];Living will [place];Living will [place];Living will - Living will Living will Living will  Does patient want to make changes to medical advance directive? - - - - - - -  Copy of [place] in Chart? No - copy requested No - copy requested No - copy requested - - - No - copy requested  Would patient like information on creating a medical advance directive? - - - - - - -    Tobacco Social History   Tobacco Use  Smoking Status Former Smoker   Packs/day: 1.00   Years: 50.00   Pack years: 50.00   Types: Cigarettes   Quit date: 2017   Years since quitting: 4.2  Smokeless Tobacco Never Used  Tobacco Comment   smoking cesssation  materials not required     Counseling given: Not Answered Comment: smoking cesssation materials not required   Clinical Intake:  Pre-visit preparation completed: Yes  Pain Score: 8      BMI - recorded: 22.46 Nutritional Status: BMI of 19-24  Normal Nutritional Risks: None Diabetes: No  How often do you need to have someone help you when you read instructions, pamphlets, or other written materials from your doctor or pharmacy?: 1 - Never  Interpreter Needed?: No  Information entered by :: Clemetine Marker LPN  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   Encounter for chronic pain management    Hx of smoking 04/17/2016   Hyperlipidemia    Hypertension    Macular degeneration    Osteoarthritis    Osteoporosis 03/22/2016   Primary osteoarthritis of both hands 05/03/2016   Past Surgical History:  Procedure Laterality Date   ABDOMINAL HYSTERECTOMY     CATARACT EXTRACTION     endaryerectomy     renal stenting     Family History  Problem Relation Age of Onset   Cancer Mother    Coronary artery disease Father    Cerebrovascular Accident Father    Cancer Sister  breast cancer   Cancer Brother        stomach   Heart disease Daughter        heart vavle bypass?   Stroke Sister    Lung disease Brother    Cancer Brother        unknown   Arthritis Daughter    Social History   Socioeconomic History   Marital status: Widowed    Spouse name: Ihor Gully   Number of children: 3   Years of education: Not on file   Highest education level: 10th grade  Occupational History   Occupation: Retired  Tobacco Use   Smoking status: Former Smoker    Packs/day: 1.00    Years: 50.00    Pack years: 50.00      Types: Cigarettes    Quit date: 2017    Years since quitting: 4.2   Smokeless tobacco: Never Used   Tobacco comment: smoking Advice worker not required  Substance and Sexual Activity   Alcohol use: No    Alcohol/week: 0.0 standard drinks   Drug use: No   Sexual activity: Not Currently  Other Topics Concern   Not on file  Social History Narrative   Not on file   Social Determinants of Health   Financial Resource Strain: Low Risk    Difficulty of Paying Living Expenses: Not hard at all  Food Insecurity: No Food Insecurity   Worried About Charity fundraiser in the Last Year: Never true   [place] in the Last Year: Never true  Transportation Needs: No Transportation Needs   Lack of Transportation (Medical): No   Lack of Transportation (Non-Medical): No  Physical Activity: Inactive   Days of Exercise per Week: 0 days   Minutes of Exercise per Session: 0 min  Stress: No Stress Concern Present   Feeling of Stress : Not at all  Social Connections: Somewhat Isolated   Frequency of Communication with Friends and Family: More than three times a week   Frequency of Social Gatherings with Friends and Family: More than three times a week   Attends Religious Services: More than 4 times per year   Active Member of [employer] or Organizations: No   Attends Archivist Meetings: Never   Marital Status: Widowed    Outpatient Encounter Medications as of 02/10/2020  Medication Sig   albuterol (PROAIR HFA) 108 (90 Base) MCG/ACT inhaler INHALE 2 PUFFS INTO THE LUNGS EVERY 4 HOURS AS NEEDED FOR WHEEZING OR SHORTNESS OF BREATH; FUTURE REFILLS FROM LUNG DOCTOR   aspirin 81 MG tablet Take 81 mg by mouth daily.   co-enzyme Q-10 30 MG capsule Take 100 mg by mouth daily.    Fluticasone-Umeclidin-Vilant (TRELEGY ELLIPTA) 100-62.5-25 MCG/INH AEPB Inhale 1 puff into the lungs daily.   [START ON 03/05/2020] HYDROcodone-acetaminophen (NORCO) 10-325 MG tablet  Take 1 tablet in the morning and in the afternoon, take 1.5 tablets at night before bed.   metoprolol succinate (TOPROL-XL) 50 MG 24 hr tablet TAKE 1 TABLET EVERY DAY WITH OR IMMEDIATELY AFTER A MEAL   pantoprazole (PROTONIX) 20 MG tablet Take 1 tablet (20 mg total) by mouth 2 (two) times daily. Caution:prolonged use may increase risk of pneumonia, colitis, osteoporosis, anemia   rosuvastatin (CRESTOR) 10 MG tablet Take 10 mg by mouth daily.   [DISCONTINUED] clopidogrel (PLAVIX) 75 MG tablet Take 1 tablet (75 mg total) by mouth daily. (Patient not taking: Reported on 01/02/2020)   [DISCONTINUED] fluorouracil (EFUDEX) 5 % cream    [  DISCONTINUED] fluticasone (FLONASE) 50 MCG/ACT nasal spray Place 2 sprays into both nostrils as needed.    [DISCONTINUED] triamcinolone (NASACORT) 55 MCG/ACT AERO nasal inhaler Place 2 sprays into the nose daily.   No facility-administered encounter medications on file as of 02/10/2020.    Activities of Daily Living In your present state of health, do you have any difficulty performing the following activities: 02/10/2020 11/24/2019  Hearing? Tempie Donning  Comment wears hearing aids -  Vision? Y Y  Difficulty concentrating or making decisions? N Y  Walking or climbing stairs? N N  Dressing or bathing? N N  Doing errands, shopping? Y Y  Comment no longer drives daughter drives her  Conservation officer, nature and eating ? N -  Using the Toilet? N -  In the past six months, have you accidently leaked urine? Y -  Comment wears pads for protection -  Do you have problems with loss of bowel control? N -  Managing your Medications? N -  Managing your Finances? N -  Housekeeping or managing your Housekeeping? N -  Some recent data might be hidden    Patient Care Team: Hubbard Hartshorn, FNP as PCP - General (Family Medicine) Clyde Canterbury, MD as Referring Physician (Otolaryngology) Baxter Kail, MD as Consulting Physician (Dermatology) Neldon Labella, RN as Case Manager Dionisio David, MD as Consulting Physician (Cardiology)    Assessment:   This is a routine wellness examination for Samantha Dawson.  Exercise Activities and Dietary recommendations Current Exercise Habits: The patient does not participate in regular exercise at present, Exercise limited by: orthopedic condition(s)  Goals     Chronic Disease Management     Current Barriers:   Chronic Disease Management support and education needs related to Hypertension, Hyperlipidemia and Chronic Kidney Disease. Hx of osteoarthritis and degenerative disc disease.   Case Manager Clinical Goal(s):   Over the next 90 days, patient will attend all medical appointments.  Over the next 90 days, patient will take all medications as prescribed.  Over the next 90 days, patient will monitor blood pressure and record readings.  Over the next 90 days, patient will adhere to recommended cardiac prudent/heart healthy diet.  Over the next 90 days, patient will adhere to recommended safety and fall prevention measures.  Interventions:   Reviewed medications and discussed indications for use. Encouraged to take all medications as prescribed. Encouraged to inform provider if unable to tolerate the prescribed regimen. Encouraged to contact care management team with concerns regarding medication management or prescription costs.   Provided education regarding s/sx of heart attack and stroke, DASH diet and complications of uncontrolled blood pressure. Discussed established BP parameters and indications for contacting provider. Encouraged to monitor BP daily and record readings. Reports monitoring every two to three days. Reports systolic range of [id number]. Reports diastolic range of [id number].  Discussed nutritional intake. Encouraged to continue adherence with recommended cardiac prudent/heart healthy diet. Reports good nutritional intake. Denies changes or loss of appetite.  Provided education regarding home safety measures and fall  prevention. Encouraged to utilize assistive devices when ambulating.  Reviewed schedule and pending appointments. Encouraged to attend all appointments as scheduled to prevent delays in care. Encouraged to notify care management team with concerns regarding transportation. Pending primary care appointment on 02/23/20. Reports pending follow-up with Cardiologist  in May.  Discussed plans for ongoing care management and follow up. Provided direct contact information for care management team.    Patient Self Care Activities:   Self administers medications  Attends scheduled provider appointments  Performs ADL's independently  Calls provider office for new concerns or questions    Please see past updates related to this goal by clicking on the "Past Updates" button in the selected goal       DIET - INCREASE WATER INTAKE     Recommend to drink at least 6-8 8oz glasses of water per day.       Fall Risk Fall Risk  02/10/2020 02/02/2020 11/28/2019 11/24/2019 10/27/2019  Falls in the past year? 0 0 0 0 0  Number falls in past yr: 0 - - 0 -  Injury with Fall? 0 - - 0 -  Comment - - - - -  Risk Factor Category  - - - - -  Risk for fall due to : Medication side effect;Impaired vision Medication side effect;Other (Comment) Medication side effect - Medication side effect  Risk for fall due to: Comment - Osteoarthritis/ Degenerative disc disease - - -  Follow up Falls prevention discussed Falls prevention discussed Falls prevention discussed - Falls prevention discussed   FALL RISK PREVENTION PERTAINING TO THE HOME:  Any stairs in or around the home? Yes  If so, do they handrails? Yes   Home free of loose throw rugs in walkways, pet beds, electrical cords, etc? Yes  Adequate lighting in your home to reduce risk of falls? Yes   ASSISTIVE DEVICES UTILIZED TO PREVENT FALLS:  Life alert? No  Use of a cane, walker or w/c? No  Grab bars in the bathroom? No  Shower chair or bench in  shower? Yes  Elevated toilet seat or a handicapped toilet? Yes   DME ORDERS:  DME order needed?  No   TIMED UP AND GO:  Was the test performed? No . Telephonic visit.   Education: Fall risk prevention has been discussed.  Intervention(s) required? No    Depression Screen PHQ 2/9 Scores 02/10/2020 02/02/2020 11/24/2019 10/27/2019  PHQ - 2 Score 0 0 2 0  PHQ- 9 Score - - 4 -     Cognitive Function     6CIT Screen 02/10/2020 03/26/2018  What Year? 0 points 0 points  What month? 0 points 0 points  What time? 0 points 0 points  Count back from 20 0 points 0 points  Months in reverse 0 points 0 points  Repeat phrase 0 points 0 points  Total Score 0 0    Immunization History  Administered Date(s) Administered   Influenza, High Dose Seasonal PF 07/22/2015, 08/29/2016, 08/29/2017, 08/27/2018, 08/30/2019   Pneumococcal Conjugate-13 05/26/2014   Pneumococcal Polysaccharide-23 06/06/2012   Tdap 09/09/2012   Zoster 06/18/2008    Qualifies for Shingles Vaccine? Yes  Zostavax completed 2009. Due for Shingrix. Education has been provided regarding the importance of this vaccine. Pt has been advised to call insurance company to determine out of pocket expense. Advised may also receive vaccine at local pharmacy or Health Dept. Verbalized acceptance and understanding.  Tdap: Up to date  Flu Vaccine: Up to date  Pneumococcal Vaccine: Up to date   Screening Tests Health Maintenance  Topic Date Due   INFLUENZA VACCINE  06/06/2020   TETANUS/TDAP  09/09/2022   DEXA SCAN  Completed   PNA vac Low Risk Adult  Completed   MAMMOGRAM  Discontinued    Cancer Screenings:  Colorectal Screening: No longer required.   Mammogram:  No longer required.   Bone Density: Completed 07/15/18. Results reflect  OSTEOPOROSIS. Repeat every 2 years. Pt declines repeat screening  at this time.   Lung Cancer Screening: (Low Dose CT Chest recommended if Age 70-80 years, 30 pack-year currently  smoking OR have quit w/in 15years.) does not qualify. Past age limit.   Additional Screening:  Hepatitis C Screening: no longer required  Vision Screening: Recommended annual ophthalmology exams for early detection of glaucoma and other disorders of the eye. Is the patient up to date with their annual eye exam?  No  Who is the provider or what is the name of the office in which the pt attends annual eye exams? Dr. Ellin Mayhew  Dental Screening: Recommended annual dental exams for proper oral hygiene  Community Resource Referral:  CRR required this visit?  No      Plan:     I have personally reviewed and addressed the Medicare Annual Wellness questionnaire and have noted the following in the patients chart:  A. Medical and social history B. Use of alcohol, tobacco or illicit drugs  C. Current medications and supplements D. Functional ability and status E.  Nutritional status F.  Physical activity G. Advance directives H. List of other physicians I.  Hospitalizations, surgeries, and ER visits in previous 12 months J.  [place] such as hearing and vision if needed, cognitive and depression L. Referrals and appointments   In addition, I have reviewed and discussed with patient certain preventive protocols, quality metrics, and best practice recommendations. A written personalized care plan for preventive services as well as general preventive health recommendations were provided to patient.   Signed,  Clemetine Marker, LPN Nurse Health Advisor   Nurse Notes: pt states she has been calling regarding incorrect prescription for pain medication and has not received a response. Will discuss with CMA & provider and have patient notified.

## 2020-02-10 NOTE — Telephone Encounter (Signed)
Patient is checking status of medication refill. Patient states she under the impression that Kristeen Miss would be doing the medication refill.  Patient states the refill should be for the 1st not for the 30th.  Patient states she has been calling since Friday.  Call back [phone]

## 2020-02-10 NOTE — Telephone Encounter (Signed)
Pharmacy has been contacted and patient will be able to pick rx today.  Patient has been notified.

## 2020-02-23 ENCOUNTER — Encounter: Payer: Self-pay | Admitting: Family Medicine

## 2020-02-23 ENCOUNTER — Telehealth (INDEPENDENT_AMBULATORY_CARE_PROVIDER_SITE_OTHER): Payer: Medicare Other | Admitting: Family Medicine

## 2020-02-23 VITALS — BP 112/67 | Ht 60.0 in | Wt 112.0 lb

## 2020-02-23 DIAGNOSIS — M5134 Other intervertebral disc degeneration, thoracic region: Secondary | ICD-10-CM

## 2020-02-23 DIAGNOSIS — I129 Hypertensive chronic kidney disease with stage 1 through stage 4 chronic kidney disease, or unspecified chronic kidney disease: Secondary | ICD-10-CM

## 2020-02-23 DIAGNOSIS — I10 Essential (primary) hypertension: Secondary | ICD-10-CM

## 2020-02-23 DIAGNOSIS — G894 Chronic pain syndrome: Secondary | ICD-10-CM

## 2020-02-23 DIAGNOSIS — K219 Gastro-esophageal reflux disease without esophagitis: Secondary | ICD-10-CM

## 2020-02-23 DIAGNOSIS — I6523 Occlusion and stenosis of bilateral carotid arteries: Secondary | ICD-10-CM

## 2020-02-23 DIAGNOSIS — J431 Panlobular emphysema: Secondary | ICD-10-CM | POA: Diagnosis not present

## 2020-02-23 DIAGNOSIS — M19041 Primary osteoarthritis, right hand: Secondary | ICD-10-CM

## 2020-02-23 DIAGNOSIS — E782 Mixed hyperlipidemia: Secondary | ICD-10-CM

## 2020-02-23 DIAGNOSIS — M19042 Primary osteoarthritis, left hand: Secondary | ICD-10-CM

## 2020-02-23 DIAGNOSIS — F1190 Opioid use, unspecified, uncomplicated: Secondary | ICD-10-CM

## 2020-02-23 DIAGNOSIS — Z79899 Other long term (current) drug therapy: Secondary | ICD-10-CM

## 2020-02-23 DIAGNOSIS — N183 Chronic kidney disease, stage 3 unspecified: Secondary | ICD-10-CM

## 2020-02-23 MED ORDER — ROSUVASTATIN CALCIUM 10 MG PO TABS: 10.0000 mg | ORAL_TABLET | Freq: Every day | ORAL | 3 refills | Status: DC

## 2020-02-23 MED ORDER — PANTOPRAZOLE SODIUM 20 MG PO TBEC: 20.0000 mg | DELAYED_RELEASE_TABLET | Freq: Two times a day (BID) | ORAL | 1 refills | Status: DC

## 2020-02-23 MED ORDER — HYDROCODONE-ACETAMINOPHEN 10-325 MG PO TABS: ORAL_TABLET | ORAL | 0 refills | Status: AC

## 2020-02-23 MED ORDER — HYDROCODONE-ACETAMINOPHEN 10-325 MG PO TABS: ORAL_TABLET | ORAL | 0 refills | Status: DC

## 2020-02-23 NOTE — Progress Notes (Signed)
Name: Samantha Dawson   MRN: [id number]    DOB: Sep 19, 1929   Date:02/23/2020       Progress Note  Subjective:    Chief Complaint  Chief Complaint  Patient presents with  . Follow-up  . Hypertension  . Hyperlipidemia    I connected with  [name]  on 02/23/20 at  9:40 AM EDT by a video enabled telemedicine application and verified that I am speaking with the correct person using two identifiers.  I discussed the limitations of evaluation and management by telemedicine and the availability of in person appointments. The patient expressed understanding and agreed to proceed. Staff also discussed with the patient that there may be a patient responsible charge related to this service. Patient Location: home Provider Location: [facility] Additional Individuals present: none  HPI  Patient was unable to connect to a video encounter today for virtual encounter so she was called on the telephone  Chronic pain from arthritis/CPS: Narcotic pain meds prescribed by PCP - she is currently out of the office on leave Pt gets hydrocodone 10-325 105 pills a month, consistent prescribing - I reviewed the PDMP -  Taking one pill in am, one pill at lunch and then 1.5 pills at bedtime. She has not concerns or SE with pain meds, she denies constipation or sedation  Last OV with PCP:She is currently taking percocet - previously prescribed by Dr. Sanda Klein at [phone] - she is taking 3.5 tablets split up throughout the day - #105 to last 30 days - has historically been very compliant with this regimen.  No problems with her medicine;no issues with SE's of percocet - has been on for many years.No problems with constipation - takes stool softener PRN.Shestill keepsher medicine locked in a safe. PMP aware is reviewed and no concerning findings.Due for UDS and tosign controlled substance agreement.   COPD: per chart emphysema, former smoker, quit 3-4 years ago, on trelegy daily, she does not believe that she  needs refills on anything, no change to respiratory sx  Usingtrelegy- still using proair PRN. She does have to rest sometimes, but overall controls her shortness of breath while doing ADL's by pacing herself.  No nighttime coughingShe is former smoker - quit 3 years ago.  Hypertension:  Currently managed on metoprolol XL 50 mg daily with food-she was on losartan 12.5 but she states that she stopped taking this recently and that her blood pressure has stayed the same.  Of note she has history of CKD and was referred to nephrology about 6 months ago but she never went.   Pt reports good med compliance and denies any SE.  No lightheadedness, hypotension, syncope. Blood pressure today is well controlled. BP Readings from Last 3 Encounters:  02/23/20 112/67  11/24/19 114/61  11/25/18 126/74  Pt denies CP, LE edema, palpitation, Ha's, visual disturbances  Hyperlipidemia: Hx of CAD, HLD carotid stenosis Current Medication Regimen:  Managed on ASA and crestor 10 mg, good compliance Last Lipids: Lab Results  Component Value Date   CHOL 143 11/25/2019   HDL 69 11/25/2019   LDLCALC 58 11/25/2019   TRIG 81 11/25/2019   CHOLHDL 2.1 11/25/2019  Dr Humphrey Rolls - Cardiology she has f/up with him next week.   Plavix was recently discontinued due to epistaxis but she is continued asa, no bleeding bruising or nosebleed concerns   GERD: Taking protonix daily (sometimes BID), still has heartburn but is better controlled. She denies difficulty swallowing, abdominal pain, changes in BM's.  Also takes tums PRN.   Thyroid nodule:managed by ENT, Dr. Richardson Landry.  No changes    Patient Active Problem List   Diagnosis Date Noted  . Carotid atherosclerosis, bilateral 08/27/2018  . Bilateral carotid bruits 08/27/2018  . CKD (chronic kidney disease) stage 3, GFR 30-59 ml/min 08/27/2018  . Age-related osteoporosis without current pathological fracture 08/11/2018  . Shoulder pain, left 08/29/2017  . Thigh pain,  musculoskeletal, left 11/29/2016  . Acid reflux 10/15/2016  . Hypoxemia 09/08/2016  . Uncomplicated opioid use [id number]  . Hyperglycemia 09/02/2016  . Left thyroid nodule 07/13/2016  . Abnormal bone scan of cervical spine 06/18/2016  . Primary osteoarthritis of both hands 05/03/2016  . Elevated serum alkaline phosphatase level 05/03/2016  . Hyperkalemia 05/03/2016  . 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 Hx of smoking 04/17/2016  . Controlled substance agreement signed 03/22/2016  . Back pain 03/22/2016  . Hyperlipidemia 10/25/2015  . Essential hypertension 10/25/2015  . Panlobular emphysema ([place]) 10/25/2015  . Protein calorie malnutrition ([place]) 05/26/2015  . Chronic pain 05/25/2015  . Actinic keratosis 05/25/2015    Social History   Tobacco Use  . Smoking status: Former Smoker    Packs/day: 1.00    Years: 50.00    Pack years: 50.00    Types: Cigarettes    Quit date: 2017    Years since quitting: 4.2  . Smokeless tobacco: Never Used  . Tobacco comment: smoking cesssation materials not required  Substance Use Topics  . Alcohol use: No    Alcohol/week: 0.0 standard drinks     Current Outpatient Medications:  .  albuterol (PROAIR HFA) 108 (90 Base) MCG/ACT inhaler, INHALE 2 PUFFS INTO THE LUNGS EVERY 4 HOURS AS NEEDED FOR WHEEZING OR SHORTNESS OF BREATH; FUTURE REFILLS FROM LUNG DOCTOR, Disp: 8.5 g, Rfl: 3 .  aspirin 81 MG tablet, Take 81 mg by mouth daily., Disp: , Rfl:  .  co-enzyme Q-10 30 MG capsule, Take 100 mg by mouth daily. , Disp: , Rfl:  .  Fluticasone-Umeclidin-Vilant (TRELEGY ELLIPTA) 100-62.5-25 MCG/INH AEPB, Inhale 1 puff into the lungs daily., Disp: 60 each, Rfl: 3 .  [START ON 03/05/2020] HYDROcodone-acetaminophen (NORCO) 10-325 MG tablet, Take 1 tablet in the morning and in the afternoon, take 1.5 tablets at night before bed., Disp: 105 tablet, Rfl: 0 .  metoprolol succinate (TOPROL-XL) 50 MG 24 hr tablet, TAKE 1 TABLET EVERY DAY WITH OR  IMMEDIATELY AFTER A MEAL, Disp: 90 tablet, Rfl: 1 .  pantoprazole (PROTONIX) 20 MG tablet, Take 1 tablet (20 mg total) by mouth 2 (two) times daily. Caution:prolonged use may increase risk of pneumonia, colitis, osteoporosis, anemia, Disp: 180 tablet, Rfl: 0 .  rosuvastatin (CRESTOR) 10 MG tablet, Take 10 mg by mouth daily., Disp: , Rfl:   Allergies  Allergen Reactions  . Alendronate Nausea Only    I personally reviewed active problem list, medication list, allergies, family history, social history, health maintenance, notes from last encounter, lab results, imaging with the patient/caregiver today.   Review of Systems  10 Systems reviewed and are negative for acute change except as noted in the HPI.   Objective:   Virtual encounter, vitals limited, only able to obtain the following Today's Vitals   02/23/20 0947  BP: 112/67  Weight: 112 lb (50.8 kg)  Height: 5' (1.524 m)   Body mass index is 21.87 kg/m. Nursing Note and Vital Signs reviewed.  Physical Exam Phonation clear, patient alert, pleasant PE limited by telephone encounter  No results found  for this or any previous visit (from the past 49 hour(s)).  Assessment and Plan:     ICD-10-CM   1. Essential hypertension  [id number] COMPLETE METABOLIC PANEL WITH GFR   Patient stopped losartan 12.5 mg daily but continued metoprolol because of some mild symptoms her blood pressure continues to be well controlled  2. Gastroesophageal reflux disease, unspecified whether esophagitis present  K21.9 pantoprazole (PROTONIX) 20 MG tablet   Stable, well controlled with Protonix  3. Panlobular emphysema ([place])  J43.1   4. Carotid atherosclerosis, bilateral  [id number]    Follows with specialist Dr. Humphrey Rolls   5. Mixed hyperlipidemia  E78.2 rosuvastatin (CRESTOR) 10 MG tablet    COMPLETE METABOLIC PANEL WITH GFR  6. Stage 3 chronic kidney disease, unspecified whether stage 3a or 3b CKD  [id number] COMPLETE METABOLIC PANEL WITH GFR   Patient previously  referred to nephrology unfortunately she stopped losartan but continue to beta-blocker we will need to check labs   7. Chronic pain syndrome  G89.4 HYDROcodone-acetaminophen (NORCO) 10-325 MG tablet    DISCONTINUED: HYDROcodone-acetaminophen (NORCO) 10-325 MG tablet  8. Degenerative disc disease, thoracic  M51.34 HYDROcodone-acetaminophen (NORCO) 10-325 MG tablet    DISCONTINUED: HYDROcodone-acetaminophen (NORCO) 10-325 MG tablet  9. Primary osteoarthritis of both hands  M19.041 HYDROcodone-acetaminophen (NORCO) 10-325 MG tablet   M19.042 DISCONTINUED: HYDROcodone-acetaminophen (NORCO) 10-325 MG tablet  10. Controlled substance agreement signed  Z79.899 HYDROcodone-acetaminophen (NORCO) 10-325 MG tablet    DISCONTINUED: HYDROcodone-acetaminophen (NORCO) 10-325 MG tablet   Signed previously with PCP  11. Uncomplicated opioid use  0000000 HYDROcodone-acetaminophen (NORCO) 10-325 MG tablet    DISCONTINUED: HYDROcodone-acetaminophen (NORCO) 10-325 MG tablet   Current month med refill put in after reviewing controlled substance database     Patient has changed some of her medications on her own and has not followed up with nephrology for concerns of chronic kidney disease we will need to recheck her renal function and electrolytes she did discontinue renal protective blood pressure medications and continued metoprolol.   I have been unable to put in multiple refills today for the patient's narcotic pain medication which manages chronic pain.  Her PCP is out of office and was refilling this 3 months at a time, I am unable to do that today.  It does appear that she has consistent prescribing and filling of medications.  Due to different prescribing laws for different providers patient may need to establish with one of the physicians for continued pain management since I have been unable to get post-dated narcotic pain meds prescribed today, nor have ever done so in the past  - I discussed the assessment  and treatment plan with the patient. The patient was provided an opportunity to ask questions and all were answered. The patient agreed with the plan and demonstrated an understanding of the instructions.  I provided 30+ minutes of non-face-to-face time during this encounter, spent 15 min on phone with patient  Delsa Grana, PA-C 02/23/20 10:27 AM

## 2020-02-24 ENCOUNTER — Encounter: Payer: Self-pay | Admitting: Family Medicine

## 2020-02-24 ENCOUNTER — Other Ambulatory Visit: Payer: Self-pay | Admitting: Family Medicine

## 2020-02-24 DIAGNOSIS — J431 Panlobular emphysema: Secondary | ICD-10-CM

## 2020-02-24 NOTE — Telephone Encounter (Signed)
Requested medication (s) are due for refill today: yes  Requested medication (s) are on the active medication list: yes  Last refill:  01/16/2020  Future visit scheduled:no  Notes to clinic:    Medication not assigned to a protocol, review manually     Requested Prescriptions  Pending Prescriptions Disp [place] 100-62.5-25 MCG/INH AEPB [Pharmacy Med Name: TRELEGY ELLIPTA 100-62.5MCG INH 30P] 60 each 3    Sig: Inhale 1 puff into the lungs daily.      Off-Protocol Failed - 02/24/2020  3:02 PM      Failed - Medication not assigned to a protocol, review manually.      Passed - Valid encounter within last 12 months    Recent Outpatient Visits           Yesterday Carotid atherosclerosis, bilateral   [facility] Delsa Grana, PA-C   3 months ago Essential hypertension   [place], FNP   5 months ago Chronic pain syndrome   [place], [place], FNP   8 months ago Degenerative disc disease, thoracic   [place], FNP   1 year ago Panlobular emphysema [facility])   [place], Satira Anis, MD

## 2020-02-27 ENCOUNTER — Ambulatory Visit (INDEPENDENT_AMBULATORY_CARE_PROVIDER_SITE_OTHER): Payer: Medicare Other

## 2020-02-27 DIAGNOSIS — I10 Essential (primary) hypertension: Secondary | ICD-10-CM

## 2020-02-27 DIAGNOSIS — Z79899 Other long term (current) drug therapy: Secondary | ICD-10-CM

## 2020-02-27 NOTE — Patient Instructions (Addendum)
Thank you for allowing the Chronic Care Management team to participate in your care.  Goals Addressed            This Visit's Progress   . Chronic Disease Management   On track    Current Barriers:  . Chronic Disease Management support and education needs related to Hypertension, Hyperlipidemia and Chronic Kidney Disease. Hx of osteoarthritis and degenerative disc disease.   Case Manager Clinical Goal(s):  Marland Kitchen Over the next 90 days, patient will attend all medical appointments. . Over the next 90 days, patient will take all medications as prescribed. . Over the next 90 days, patient will monitor blood pressure and record readings. . Over the next 90 days, patient will adhere to recommended cardiac prudent/heart healthy diet. . Over the next 90 days, patient will adhere to recommended safety and fall prevention measures.  Interventions:  . Reviewed medications and discussed indications for use. Encouraged to take all medications as prescribed. Encouraged to inform provider if unable to tolerate the prescribed regimen. Encouraged to contact care management team with concerns regarding medication management or prescription costs. Expressed concerns regarding ability to obtain 3 month supply of pain medications. Will follow up in June to ensure medications can be filled as requested in July. . Provided education regarding s/sx of heart attack and stroke, DASH diet and complications of uncontrolled blood pressure. Discussed established BP parameters and indications for contacting provider. Encouraged to monitor BP daily and record readings. Readings remain within range. Reports systolic range between Q000111Q. Reports diastolic range in the 0000000.  . Discussed nutritional intake. Encouraged to continue adherence with recommended cardiac prudent/heart healthy diet. Reports good intake. No loss of appetite. . Reviewed schedule and pending appointments. Encouraged to attend all appointments as scheduled  to prevent delays in care. Encouraged to notify care management team with concerns regarding transportation. Reports requiring a clinic follow-up every three months to obtain pain medication refills. Currently scheduled for follow-up on June 08, 2020 (assigned PCP is currently out of the office)  Will reschedule visit for July if assigned PCP is available. . Discussed plans for ongoing care management and follow up. Provided direct contact information for care management team.    Patient Self Care Activities:  . Self administers medications  . Attends scheduled provider appointments . Performs ADL's independently . Calls provider office for new concerns or questions    Please see past updates related to this goal by clicking on the "Past Updates" button in the selected goal         Samantha Dawson verbalized understanding of the instructions provided during the telephonic outreach today. Declined need for a mailed/printed copy of the instructions.    PLAN The care management team will follow-up with Samantha Dawson within the next two months. She was encouraged to call with any health related concerns if needed.    [facility] Care Management [phone]

## 2020-02-27 NOTE — Chronic Care Management (AMB) (Signed)
Chronic Care Management   Follow Up Note   02/27/2020 Name: KAMORIE CORY MRN: [id number] DOB: 09-26-1929  Primary Care Provider: Hubbard Hartshorn, FNP Reason for referral : Chronic Care Management   VIRGIE HUBEL is a 86 y.o. year old female who is a primary care patient of Hubbard Hartshorn, FNP. She is currently engaged with the Chronic Care Management team. A routine telephonic outreach was conducted today.  Review of Ms. Hadley's status, including review of consultants reports, relevant labs and test results was conducted today. Collaboration with appropriate care team members was performed as part of the comprehensive evaluation and provision of chronic care management services.    Outpatient Encounter Medications as of 02/27/2020  Medication Sig  . albuterol (PROAIR HFA) 108 (90 Base) MCG/ACT inhaler INHALE 2 PUFFS INTO THE LUNGS EVERY 4 HOURS AS NEEDED FOR WHEEZING OR SHORTNESS OF BREATH; FUTURE REFILLS FROM LUNG DOCTOR  . aspirin 81 MG tablet Take 81 mg by mouth daily.  Marland Kitchen co-enzyme Q-10 30 MG capsule Take 100 mg by mouth daily.   Derrill Memo ON 03/11/2020] HYDROcodone-acetaminophen (NORCO) 10-325 MG tablet Take 1 tablet in the morning and in the afternoon, take 1 to 1.5 tablets at night before bed.  For chronic pain syndrome  . metoprolol succinate (TOPROL-XL) 50 MG 24 hr tablet TAKE 1 TABLET EVERY DAY WITH OR IMMEDIATELY AFTER A MEAL  . pantoprazole (PROTONIX) 20 MG tablet Take 1 tablet (20 mg total) by mouth 2 (two) times daily. Caution:prolonged use may increase risk of pneumonia, colitis, osteoporosis, anemia.  Skip doses if you can  . rosuvastatin (CRESTOR) 10 MG tablet Take 1 tablet (10 mg total) by mouth daily.  . TRELEGY ELLIPTA 100-62.5-25 MCG/INH AEPB INHALE 1 PUFF INTO THE LUNGS DAILY   No facility-administered encounter medications on file as of 02/27/2020.     Objective:  BP Readings from Last 3 Encounters:  02/23/20 112/67  11/24/19 114/61  11/25/18 126/74   Lab  Results  Component Value Date   CHOL 143 11/25/2019   HDL 69 11/25/2019   LDLCALC 58 11/25/2019   TRIG 81 11/25/2019   CHOLHDL 2.1 11/25/2019    Goals Addressed            This Visit's Progress   . Chronic Disease Management   On track    Current Barriers:  . Chronic Disease Management support and education needs related to Hypertension, Hyperlipidemia and Chronic Kidney Disease. Hx of osteoarthritis and degenerative disc disease.   Case Manager Clinical Goal(s):  Marland Kitchen Over the next 90 days, patient will attend all medical appointments. . Over the next 90 days, patient will take all medications as prescribed. . Over the next 90 days, patient will monitor blood pressure and record readings. . Over the next 90 days, patient will adhere to recommended cardiac prudent/heart healthy diet. . Over the next 90 days, patient will adhere to recommended safety and fall prevention measures.  Interventions:  . Reviewed medications and discussed indications for use. Encouraged to take all medications as prescribed. Encouraged to inform provider if unable to tolerate the prescribed regimen. Encouraged to contact care management team with concerns regarding medication management or prescription costs. Expressed concerns regarding ability to obtain 3 month supply of pain medications. Will follow up in June to ensure medications can be filled as requested in July. . Provided education regarding s/sx of heart attack and stroke, DASH diet and complications of uncontrolled blood pressure. Discussed established BP parameters and indications for  contacting provider. Encouraged to monitor BP daily and record readings. Readings remain within range. Reports systolic range between Q000111Q. Reports diastolic range in the 0000000.  . Discussed nutritional intake. Encouraged to continue adherence with recommended cardiac prudent/heart healthy diet. Reports good intake. No loss of appetite. . Reviewed schedule and pending  appointments. Encouraged to attend all appointments as scheduled to prevent delays in care. Encouraged to notify care management team with concerns regarding transportation. Reports requiring a clinic follow-up every three months to obtain pain medication refills. Currently scheduled for follow-up on June 08, 2020 (assigned PCP is currently out of the office)  Will reschedule visit for July if assigned PCP is available. . Discussed plans for ongoing care management and follow up. Provided direct contact information for care management team.    Patient Self Care Activities:  . Self administers medications  . Attends scheduled provider appointments . Performs ADL's independently . Calls provider office for new concerns or questions    Please see past updates related to this goal by clicking on the "Past Updates" button in the selected goal          PLAN The care management team will follow-up with Ms. Kier within the next two months. She was encouraged to call with any health related concerns if needed.   [facility] Care Management [phone]

## 2020-03-15 LAB — COMPLETE METABOLIC PANEL WITH GFR
AG Ratio: 1.1 (calc) (ref 1.0–2.5)
ALT: 10 U/L (ref 6–29)
AST: 20 U/L (ref 10–35)
Albumin: 3.6 g/dL (ref 3.6–5.1)
Alkaline phosphatase (APISO): 72 U/L (ref 37–153)
BUN/Creatinine Ratio: 16 (calc) (ref 6–22)
BUN: 20 mg/dL (ref 7–25)
CO2: 25 mmol/L (ref 20–32)
Calcium: 9.5 mg/dL (ref 8.6–10.4)
Chloride: 103 mmol/L (ref 98–110)
Creat: 1.24 mg/dL — ABNORMAL HIGH (ref 0.60–0.88)
GFR, Est African American: 45 mL/min/{1.73_m2} — ABNORMAL LOW (ref >=60)
GFR, Est Non African American: 39 mL/min/{1.73_m2} — ABNORMAL LOW (ref >=60)
Globulin: 3.2 g/dL (calc) (ref 1.9–3.7)
Glucose, Bld: 117 mg/dL — ABNORMAL HIGH (ref 65–99)
Potassium: 5.3 mmol/L (ref 3.5–5.3)
Sodium: 140 mmol/L (ref 135–146)
Total Bilirubin: 0.9 mg/dL (ref 0.2–1.2)
Total Protein: 6.8 g/dL (ref 6.1–8.1)

## 2020-03-25 ENCOUNTER — Encounter: Payer: Self-pay | Admitting: Family Medicine

## 2020-04-09 ENCOUNTER — Telehealth: Payer: Self-pay

## 2020-04-09 ENCOUNTER — Ambulatory Visit: Payer: Self-pay

## 2020-04-09 NOTE — Chronic Care Management (AMB) (Signed)
  Chronic Care Management   Outreach Note  04/09/2020 Name: Samantha Dawson MRN: [id number] DOB: 1928-11-07  Primary Care Provider: Hubbard Hartshorn, FNP Reason for referral : Chronic Care Management   An unsuccessful telephone outreach was attempted today. [name] is engaged with the chronic care management team.  A HIPAA compliant voice message was left requesting a return call.   Follow Up Plan The care management team will reach out to [name] again within the next two weeks.     [facility] Care Management [phone]

## 2020-04-12 ENCOUNTER — Ambulatory Visit: Payer: Medicare Other | Admitting: Family Medicine

## 2020-04-15 ENCOUNTER — Telehealth: Payer: Self-pay

## 2020-04-15 NOTE — Telephone Encounter (Signed)
Copied from [place] [phone]. Topic: General - Other >> Apr 14, 2020  4:11 PM Rainey Pines A wrote: Patient wants to know if Lucio Edward wants to see her before her appointment in August. Please advise

## 2020-04-16 NOTE — Telephone Encounter (Signed)
If patient does not f/up with nephrology then we can recheck her before her august appt  We need to have her get back on her other BP med which protects the kidney- if she wants to come in to address that she can schedule another appt sooner

## 2020-04-23 ENCOUNTER — Ambulatory Visit (INDEPENDENT_AMBULATORY_CARE_PROVIDER_SITE_OTHER): Payer: Medicare Other

## 2020-04-23 DIAGNOSIS — I10 Essential (primary) hypertension: Secondary | ICD-10-CM

## 2020-04-23 DIAGNOSIS — Z79899 Other long term (current) drug therapy: Secondary | ICD-10-CM

## 2020-04-23 NOTE — Chronic Care Management (AMB) (Signed)
Chronic Care Management   Follow Up Note   04/23/2020 Name: Samantha Dawson MRN: [id number] DOB: 12-18-1928  Primary Care Provider: Steele Sizer, MD Reason for referral : Chronic Care Management   Samantha Dawson is a 86 y.o. year old female who is a primary care patient of Samantha Sizer, MD. She is currently engaged with the chronic care management team. A routine telephonic outreach was conducted today. Her primary concern was possibly discontinuing Trelegy. She plans to discuss options for other inhalers during her PCP visit on 05/04/20.   Review of Samantha Dawson's status, including review of consultants reports, relevant labs and test results was conducted today. Collaboration with appropriate care team members was performed as part of the comprehensive evaluation and provision of chronic care management services.    SDOH (Social Determinants of Health) assessments performed: No     Outpatient Encounter Medications as of 04/23/2020  Medication Sig Note  . albuterol (PROAIR HFA) 108 (90 Base) MCG/ACT inhaler INHALE 2 PUFFS INTO THE LUNGS EVERY 4 HOURS AS NEEDED FOR WHEEZING OR SHORTNESS OF BREATH; FUTURE REFILLS FROM LUNG DOCTOR   . aspirin 81 MG tablet Take 81 mg by mouth daily.   . pantoprazole (PROTONIX) 20 MG tablet Take 1 tablet (20 mg total) by mouth 2 (two) times daily. Caution:prolonged use may increase risk of pneumonia, colitis, osteoporosis, anemia.  Skip doses if you can   . rosuvastatin (CRESTOR) 10 MG tablet Take 1 tablet (10 mg total) by mouth daily.   . TRELEGY ELLIPTA 100-62.5-25 MCG/INH AEPB INHALE 1 PUFF INTO THE LUNGS DAILY   . co-enzyme Q-10 30 MG capsule Take 100 mg by mouth daily.    . metoprolol succinate (TOPROL-XL) 50 MG 24 hr tablet TAKE 1 TABLET EVERY DAY WITH OR IMMEDIATELY AFTER A MEAL (Patient not taking: Reported on 04/23/2020) 04/23/2020: Reports medication was discontinued by Cardiologist   No facility-administered encounter medications on file as of  04/23/2020.     Objective:  BP Readings from Last 3 Encounters:  02/23/20 112/67  11/24/19 114/61  11/25/18 126/74    Goals Addressed            This Visit's Progress   . Chronic Disease Management       Current Barriers:  . Chronic Disease Management support and education needs related to Hypertension, Hyperlipidemia and Chronic Kidney Disease. Hx of osteoarthritis and degenerative disc disease.   Case Manager Clinical Goal(s):  Marland Kitchen Over the next 90 days, patient will attend all medical appointments. . Over the next 90 days, patient will take all medications as prescribed. . Over the next 90 days, patient will monitor blood pressure and record readings. . Over the next 90 days, patient will adhere to recommended cardiac prudent/heart healthy diet. . Over the next 90 days, patient will adhere to recommended safety and fall prevention measures.  Interventions:  . Reviewed medications. Expressed concerns regarding Trelegy. Reports hoarseness after using the inhaler. She intends to discuss options for other inhalers during her PCP visit on 05/04/20. Will update team if medication assistance is needed.  . Discussed BP readings. Reports not monitoring for a few days but reports a reading of 90/65 earlier this week. Reports Cardiology team is aware of the lower BP readings. Reports recently starting supplemental oxygen. Currently  using at 2L/min all night and intermittently during the day. Verbalized awareness of indications for seeking medical attention. . Discussed nutritional intake and current activity level. Reports good appetite and focuses on staying hydrated. Continues  to experience joint pain but reports ambulating well. Currently using supplemental oxygen. Denies decline in activity tolerance. Reports following recommended safety measures.  . Discussed plans for ongoing care management. Denies urgent care management needs. Reports her daughter Samantha Dawson is still available to assist. Will  follow-up within the next two weeks.    Patient Self Care Activities:  . Self administers medications  . Attends scheduled provider appointments . Performs ADL's independently . Calls provider office for new concerns or questions    Please see past updates related to this goal by clicking on the "Past Updates" button in the selected goal          PLAN The care management team will follow-up with Samantha Dawson in two weeks.   [facility] [phone]

## 2020-04-26 ENCOUNTER — Other Ambulatory Visit: Payer: Self-pay

## 2020-04-26 DIAGNOSIS — J431 Panlobular emphysema: Secondary | ICD-10-CM

## 2020-04-26 MED ORDER — ALBUTEROL SULFATE HFA 108 (90 BASE) MCG/ACT IN AERS: INHALATION_SPRAY | RESPIRATORY_TRACT | 3 refills | Status: DC

## 2020-05-04 ENCOUNTER — Other Ambulatory Visit: Payer: Self-pay

## 2020-05-04 ENCOUNTER — Encounter: Payer: Self-pay | Admitting: Family Medicine

## 2020-05-04 ENCOUNTER — Ambulatory Visit (INDEPENDENT_AMBULATORY_CARE_PROVIDER_SITE_OTHER): Payer: Medicare Other | Admitting: Family Medicine

## 2020-05-04 VITALS — BP 120/90 | HR 104 | Temp 97.3°F | Resp 16 | Ht 60.0 in | Wt 107.4 lb

## 2020-05-04 DIAGNOSIS — M19041 Primary osteoarthritis, right hand: Secondary | ICD-10-CM

## 2020-05-04 DIAGNOSIS — M810 Age-related osteoporosis without current pathological fracture: Secondary | ICD-10-CM | POA: Diagnosis not present

## 2020-05-04 DIAGNOSIS — D692 Other nonthrombocytopenic purpura: Secondary | ICD-10-CM | POA: Diagnosis not present

## 2020-05-04 DIAGNOSIS — M19042 Primary osteoarthritis, left hand: Secondary | ICD-10-CM

## 2020-05-04 DIAGNOSIS — J431 Panlobular emphysema: Secondary | ICD-10-CM

## 2020-05-04 DIAGNOSIS — M5134 Other intervertebral disc degeneration, thoracic region: Secondary | ICD-10-CM

## 2020-05-04 DIAGNOSIS — R739 Hyperglycemia, unspecified: Secondary | ICD-10-CM

## 2020-05-04 DIAGNOSIS — N1832 Chronic kidney disease, stage 3b: Secondary | ICD-10-CM

## 2020-05-04 DIAGNOSIS — I6523 Occlusion and stenosis of bilateral carotid arteries: Secondary | ICD-10-CM | POA: Diagnosis not present

## 2020-05-04 DIAGNOSIS — J439 Emphysema, unspecified: Secondary | ICD-10-CM

## 2020-05-04 DIAGNOSIS — G894 Chronic pain syndrome: Secondary | ICD-10-CM

## 2020-05-04 DIAGNOSIS — G4736 Sleep related hypoventilation in conditions classified elsewhere: Secondary | ICD-10-CM

## 2020-05-04 DIAGNOSIS — K219 Gastro-esophageal reflux disease without esophagitis: Secondary | ICD-10-CM

## 2020-05-04 MED ORDER — HYDROCODONE-ACETAMINOPHEN 10-325 MG PO TABS: 1.0000 | ORAL_TABLET | Freq: Two times a day (BID) | ORAL | 0 refills | Status: AC

## 2020-05-04 MED ORDER — ANORO ELLIPTA 62.5-25 MCG/INH IN AEPB: 1.0000 | INHALATION_SPRAY | Freq: Every day | RESPIRATORY_TRACT | 3 refills | Status: DC

## 2020-05-04 MED ORDER — GABAPENTIN 100 MG PO CAPS: 100.0000 mg | ORAL_CAPSULE | Freq: Every day | ORAL | 0 refills | Status: DC

## 2020-05-04 NOTE — Patient Instructions (Signed)
Take half of hydrocodone in am, half at lunch, one at 6 pm and gabapentin before bed  Start gabapentin 100 mg at night for one week, increase to two second week and after that 3 at bedtime

## 2020-05-04 NOTE — Progress Notes (Signed)
Name: Samantha Dawson   MRN: [id number]    DOB: October 10, 1929   Date:05/04/2020       Progress Note  Subjective  Chief Complaint  Chief Complaint  Patient presents with   Hypertension   Hyperglycemia   Hyperlipidemia   Gastroesophageal Reflux   She came in with her daughter: [name]   HPI  Chronic pain from arthritis/CPS:"it's about the same", I hurt all over - different areas of her body - shoulder, mid back, hands.She is currently taking Hydrocodone apap  previously prescribed by Dr. Sanda Klein at [phone] - she is taking 3.5 tablets split up throughout the day - #105 to last 30 days - has historically been very compliant with this regimen.  No problems with her medicineNo problems with constipation - takes stool softener PRN.Shestill keepsher medicine locked in a safe. PMP aware is reviewed and no concerning findings. Contract agreement done 11/2019. Explained I don't feel comfortable giving her 105 per month, I offered 5.325 dose to take 3 daily or 10/325 to take BID, or referral to pain clinic. We will also add gabapenin for her to take before bed , titrating it up slowly . She asked for #60 bid and add gabapenin. She wants to hold off on going to pain clinic. Explained avoid nsaid's but can take two tylenols per day in conjunction of hydrocodone / apap and not be in toxic range  COPD: Usingtrelegybut gets hoarse and wants to stop medication. She is now on oxygen, she has been having nocturnal hypoxemia, she also wears it intermittently during the day, she states mild intermittent cough, her symptoms is mostly sob. She quit smoking about 5 years ago. Discussed lung CT but she wants to hold off for now.   HTN/CKD: BP's at home has been low.  she cannot recall the values. She was recently seen by Dr. Humphrey Rolls and losartan and metoprolol have been stopped, she denies chest pain or palpitation or dizziness.   CAD/HLD: She is off Plavix because of nose bleeds, she is now on Crestor and  aspirin 81 mg, she has intermittent chest pain , it is described as brief, not associated with diaphoresis or sob  GERD: Taking protonix prn only now and doing well, no heartburn or indigestion   Thyroid nodule :managed by ENT, Dr. Richardson Landry.  No changes  Osteoporosis  : she does not want to discuss therapy, not a good candidate for alendronate due to CKI stage III, discussed referral to endo but she would like to hold off for now   Senile purpura: gave her reassurance, on aspirin   Patient Active Problem List   Diagnosis Date Noted   Carotid atherosclerosis, bilateral 08/27/2018   Bilateral carotid bruits 08/27/2018   CKD (chronic kidney disease) stage 3, GFR 30-59 ml/min 08/27/2018   Age-related osteoporosis without current pathological fracture 08/11/2018   Shoulder pain, left 08/29/2017   Thigh pain, musculoskeletal, left 11/29/2016   Acid reflux 10/15/2016   Hypoxemia 47/82/9562   Uncomplicated opioid use 13/06/6577   Hyperglycemia 09/02/2016   Left thyroid nodule 07/13/2016   Abnormal bone scan of cervical spine 06/18/2016   Primary osteoarthritis of both hands 05/03/2016   Elevated serum alkaline phosphatase level 05/03/2016   Hyperkalemia 05/03/2016   Degenerative disc disease, thoracic 05/03/2016   Hx of smoking 04/17/2016   Controlled substance agreement signed 03/22/2016   Back pain 03/22/2016   Hyperlipidemia 10/25/2015   Essential hypertension 10/25/2015   Panlobular emphysema ([place]) 10/25/2015   Protein calorie malnutrition ([place]) 05/26/2015  Chronic pain 05/25/2015   Actinic keratosis 05/25/2015    Past Surgical History:  Procedure Laterality Date   ABDOMINAL HYSTERECTOMY     CATARACT EXTRACTION     endaryerectomy     renal stenting      Family History  Problem Relation Age of Onset   Cancer Mother    Coronary artery disease Father    Cerebrovascular Accident Father    Cancer Sister        breast cancer   Cancer  Brother        stomach   Heart disease Daughter        heart vavle bypass?   Stroke Sister    Lung disease Brother    Cancer Brother        unknown   Arthritis Daughter     Social History   Tobacco Use   Smoking status: Former Smoker    Packs/day: 1.00    Years: 50.00    Pack years: 50.00    Types: Cigarettes    Quit date: 2017    Years since quitting: 4.4   Smokeless tobacco: Never Used   Tobacco comment: smoking Advice worker not required  Substance Use Topics   Alcohol use: No    Alcohol/week: 0.0 standard drinks     Current Outpatient Medications:    albuterol (PROAIR HFA) 108 (90 Base) MCG/ACT inhaler, INHALE 2 PUFFS INTO THE LUNGS EVERY 4 HOURS AS NEEDED FOR WHEEZING OR SHORTNESS OF BREATH; FUTURE REFILLS FROM LUNG DOCTOR, Disp: 8.5 g, Rfl: 3   aspirin 81 MG tablet, Take 81 mg by mouth daily., Disp: , Rfl:    co-enzyme Q-10 30 MG capsule, Take 100 mg by mouth daily. , Disp: , Rfl:    pantoprazole (PROTONIX) 20 MG tablet, Take 1 tablet (20 mg total) by mouth 2 (two) times daily. Caution:prolonged use may increase risk of pneumonia, colitis, osteoporosis, anemia.  Skip doses if you can, Disp: 180 tablet, Rfl: 1   rosuvastatin (CRESTOR) 10 MG tablet, Take 1 tablet (10 mg total) by mouth daily., Disp: 90 tablet, Rfl: 3   TRELEGY ELLIPTA 100-62.5-25 MCG/INH AEPB, INHALE 1 PUFF INTO THE LUNGS DAILY, Disp: 60 each, Rfl: 3   metoprolol succinate (TOPROL-XL) 50 MG 24 hr tablet, TAKE 1 TABLET EVERY DAY WITH OR IMMEDIATELY AFTER A MEAL (Patient not taking: Reported on 05/04/2020), Disp: 90 tablet, Rfl: 1  Allergies  Allergen Reactions   Alendronate Nausea Only    I personally reviewed active problem list, medication list, allergies, family history, social history, health maintenance with the patient/caregiver today.   ROS  Constitutional: Negative for fever or weight change.  Respiratory: Negative for cough and shortness of breath.   Cardiovascular:  Negative for chest pain or palpitations.  Gastrointestinal: Negative for abdominal pain, no bowel changes.  Musculoskeletal: Negative for gait problem or joint swelling.  Skin: Negative for rash.  Neurological: Negative for dizziness or headache.  No other specific complaints in a complete review of systems (except as listed in HPI above).  Objective  Vitals:   05/04/20 1147  BP: 120/90  Pulse: (!) 104  Resp: 16  Temp: (!) 97.3 F (36.3 C)  TempSrc: Temporal  SpO2: 92%  Weight: 107 lb 6.4 oz (48.7 kg)  Height: 5' (1.524 m)    Body mass index is 20.98 kg/m.  Physical Exam  Constitutional: Patient appears well-developed , very petit, kyphotic No distress.  HEENT: head atraumatic, normocephalic, pupils equal and reactive to light, ears : hearing  aid, neck decreased rom,  Cardiovascular: Normal rate, regular rhythm and normal heart sounds.  No murmur heard. No BLE edema. Pulmonary/Chest: Effort normal and breath sounds normal. No respiratory distress. Abdominal: Soft.  There is no tenderness. Muscular skeletal: osteoarthritis changes on both hands/fingers, kyphosis, decreased rom of neck, normal rom of shoulder, pain during palpation of paraspinal muscles of spine, walks slowly  Psychiatric: Patient has a normal mood and affect. behavior is normal. Judgment and thought content normal.  Recent Results (from the past 2160 hour(s))  COMPLETE METABOLIC PANEL WITH GFR     Status: Abnormal   Collection Time: 03/15/20 11:37 AM  Result Value Ref Range   Glucose, Bld 117 (H) 65 - 99 mg/dL    Comment: .            Fasting reference interval . For someone without known diabetes, a glucose value between 100 and 125 mg/dL is consistent with prediabetes and should be confirmed with a follow-up test. .    BUN 20 7 - 25 mg/dL   Creat 1.24 (H) 0.60 - 0.88 mg/dL    Comment: For patients >69 years of age, the reference limit for Creatinine is approximately 13% higher for  people identified as African-American. .    GFR, Est Non African American 39 (L) > OR = 60 mL/min/1.73m2   GFR, Est African American 45 (L) > OR = 60 mL/min/1.31m2   BUN/Creatinine Ratio 16 6 - 22 (calc)   Sodium 140 135 - 146 mmol/L   Potassium 5.3 3.5 - 5.3 mmol/L   Chloride 103 98 - 110 mmol/L   CO2 25 20 - 32 mmol/L   Calcium 9.5 8.6 - 10.4 mg/dL   Total Protein 6.8 6.1 - 8.1 g/dL   Albumin 3.6 3.6 - 5.1 g/dL   Globulin 3.2 1.9 - 3.7 g/dL (calc)   AG Ratio 1.1 1.0 - 2.5 (calc)   Total Bilirubin 0.9 0.2 - 1.2 mg/dL   Alkaline phosphatase (APISO) 72 37 - 153 U/L   AST 20 10 - 35 U/L   ALT 10 6 - 29 U/L    PHQ2/9: Depression screen [facility] 2/9 05/04/2020 04/23/2020 02/27/2020 02/23/2020 02/10/2020  Decreased Interest 0 0 0 0 0  Down, Depressed, Hopeless 0 1 0 0 0  PHQ - 2 Score 0 1 0 0 0  Altered sleeping 0 - - 0 -  Tired, decreased energy 0 - - 0 -  Change in appetite 0 - - 0 -  Feeling bad or failure about yourself  0 - - 0 -  Trouble concentrating 0 - - 0 -  Moving slowly or fidgety/restless 0 - - 0 -  Suicidal thoughts 0 - - 0 -  PHQ-9 Score 0 - - 0 -  Difficult doing work/chores - - - Not difficult at all -  Some recent data might be hidden    phq 9 is negative   Fall Risk: Fall Risk  02/27/2020 02/23/2020 02/10/2020 02/02/2020 11/28/2019  Falls in the past year? 0 0 0 0 0  Number falls in past yr: - 0 0 - -  Injury with Fall? - 0 0 - -  Comment - - - - -  Risk Factor Category  - - - - -  Risk for fall due to : - - Medication side effect;Impaired vision Medication side effect;Other (Comment) Medication side effect  Risk for fall due to: Comment - - - Osteoarthritis/ Degenerative disc disease -  Follow up - - Falls  prevention discussed Falls prevention discussed Falls prevention discussed    Functional Status Survey: Is the patient deaf or have difficulty hearing?: Yes Does the patient have difficulty seeing, even when wearing glasses/contacts?: Yes Does the patient have  difficulty concentrating, remembering, or making decisions?: No Does the patient have difficulty walking or climbing stairs?: No Does the patient have difficulty dressing or bathing?: No Does the patient have difficulty doing errands alone such as visiting a doctor's office or shopping?: Yes    Assessment & Plan  1. Stage 3b chronic kidney disease  Discussed avoiding nsaid's  2. Senile purpura (HCC)  Both arms  3. Age-related osteoporosis without current pathological fracture  Refuses seeing ENdo,  4. Carotid atherosclerosis, bilateral   5. Gastroesophageal reflux disease without esophagitis  Doing better   6. Primary osteoarthritis of both hands   7. Chronic pain syndrome  - gabapentin (NEURONTIN) 100 MG capsule; Take 1-3 capsules (100-300 mg total) by mouth at bedtime.  Dispense: 90 capsule; Refill: 0 - HYDROcodone-acetaminophen (NORCO) 10-325 MG tablet; Take 1 tablet by mouth 2 (two) times daily for 5 days.  Dispense: 60 tablet; Refill: 0  8. Panlobular emphysema ([place])  - umeclidinium-vilanterol (ANORO ELLIPTA) 62.5-25 MCG/INH AEPB; Inhale 1 puff into the lungs daily.  Dispense: 60 each; Refill: 3  9. Nocturnal hypoxemia due to emphysema (HCC)  - umeclidinium-vilanterol (ANORO ELLIPTA) 62.5-25 MCG/INH AEPB; Inhale 1 puff into the lungs daily.  Dispense: 60 each; Refill: 3  10. Degenerative disc disease, thoracic   11. Hyperglycemia  Check labs next visit

## 2020-05-05 NOTE — Patient Instructions (Signed)
Thank you for allowing the Chronic Care Management team to participate in your care.  Goals Addressed            This Visit's Progress   . Chronic Disease Management       Current Barriers:  . Chronic Disease Management support and education needs related to Hypertension, Hyperlipidemia and Chronic Kidney Disease. Hx of osteoarthritis and degenerative disc disease.   Case Manager Clinical Goal(s):  Marland Kitchen Over the next 90 days, patient will attend all medical appointments. . Over the next 90 days, patient will take all medications as prescribed. . Over the next 90 days, patient will monitor blood pressure and record readings. . Over the next 90 days, patient will adhere to recommended cardiac prudent/heart healthy diet. . Over the next 90 days, patient will adhere to recommended safety and fall prevention measures.  Interventions:  . Reviewed medications. Expressed concerns regarding Trelegy. Reports hoarseness after using the inhaler. She intends to discuss options for other inhalers during her PCP visit on 05/04/20. Will update team if medication assistance is needed.  . Discussed BP readings. Reports not monitoring for a few days but reports a reading of 90/65 earlier this week. Reports Cardiology team is aware of the lower BP readings. Reports recently starting supplemental oxygen. Currently  using at 2L/min all night and intermittently during the day. Verbalized awareness of indications for seeking medical attention. . Discussed nutritional intake and current activity level. Reports good appetite and focuses on staying hydrated. Continues to experience joint pain but reports ambulating well. Currently using supplemental oxygen. Denies decline in activity tolerance. Reports following recommended safety measures.  . Discussed plans for ongoing care management. Denies urgent care management needs. Reports her daughter [name] is still available to assist. Will follow-up within the next two  weeks.    Patient Self Care Activities:  . Self administers medications  . Attends scheduled provider appointments . Performs ADL's independently . Calls provider office for new concerns or questions    Please see past updates related to this goal by clicking on the "Past Updates" button in the selected goal         Ms. Dengel verbalized understanding of the information discussed during the telephonic outreach today. Declined need for mailed/printed instructions.   The care management team will follow-up with Ms. Keatts in two weeks.   [facility] [phone]

## 2020-05-07 ENCOUNTER — Ambulatory Visit: Payer: Self-pay

## 2020-05-07 DIAGNOSIS — J431 Panlobular emphysema: Secondary | ICD-10-CM

## 2020-05-07 DIAGNOSIS — Z79899 Other long term (current) drug therapy: Secondary | ICD-10-CM

## 2020-05-07 NOTE — Chronic Care Management (AMB) (Signed)
Chronic Care Management   Follow Up Note   05/07/2020 Name: Samantha Dawson MRN: [id number] DOB: [birth year]  Primary Care Provider: Steele Sizer, MD Reason for referral : Chronic Care Management   Samantha Dawson is a 86 y.o. year old female who is a primary care patient of Steele Sizer, MD. She is currently engaged with the chronic care management team. A brief follow-up outreach was conducted today.   Review of Samantha Dawson's status, including review of consultants reports, relevant labs and test results was conducted today. Collaboration with appropriate care team members was performed as part of the comprehensive evaluation and provision of chronic care management services.    SDOH (Social Determinants of Health) assessments performed: No     Outpatient Encounter Medications as of 05/07/2020  Medication Sig  . albuterol (PROAIR HFA) 108 (90 Base) MCG/ACT inhaler INHALE 2 PUFFS INTO THE LUNGS EVERY 4 HOURS AS NEEDED FOR WHEEZING OR SHORTNESS OF BREATH; FUTURE REFILLS FROM LUNG DOCTOR  . aspirin 81 MG tablet Take 81 mg by mouth daily.  Marland Kitchen co-enzyme Q-10 30 MG capsule Take 100 mg by mouth daily.   Marland Kitchen gabapentin (NEURONTIN) 100 MG capsule Take 1-3 capsules (100-300 mg total) by mouth at bedtime.  Marland Kitchen HYDROcodone-acetaminophen (NORCO) 10-325 MG tablet Take 1 tablet by mouth 2 (two) times daily for 5 days.  . pantoprazole (PROTONIX) 20 MG tablet Take 1 tablet (20 mg total) by mouth 2 (two) times daily. Caution:prolonged use may increase risk of pneumonia, colitis, osteoporosis, anemia.  Skip doses if you can  . rosuvastatin (CRESTOR) 10 MG tablet Take 1 tablet (10 mg total) by mouth daily.  Marland Kitchen umeclidinium-vilanterol (ANORO ELLIPTA) 62.5-25 MCG/INH AEPB Inhale 1 puff into the lungs daily.   No facility-administered encounter medications on file as of 05/07/2020.     Goals Addressed            This Visit's Progress   . COMPLETED: Chronic Disease Management       Current Barriers:   . Chronic Disease Management support and education needs related to Hypertension, Hyperlipidemia and Chronic Kidney Disease. Hx of osteoarthritis and degenerative disc disease.   Case Manager Clinical Goal(s):  Marland Kitchen Over the next 90 days, patient will attend all medical appointments.-Complete . Over the next 90 days, patient will take all medications as prescribed.-Complete . Over the next 90 days, patient will monitor blood pressure and record readings.-Complete . Over the next 90 days, patient will adhere to recommended cardiac prudent/heart healthy diet.-Complete . Over the next 90 days, patient will adhere to recommended safety and fall prevention measures.-Complete  Interventions:  . Discussed plans for ongoing care management. Attended outreach with Dr. Ancil Boozer on 05/04/20 as scheduled. Inhaler changed from Trelegy to [employer]. Reports using it for the first time last night. Denies current concerns regarding tolerance or prescription cost but will update team. Continues to use supplemental oxygen as recommended. Denies changes.The care management team will follow-up in two months.    Patient Self Care Activities:  . Self administers medications  . Attends scheduled provider appointments . Performs ADL's independently . Calls provider office for new concerns or questions    Please see past updates related to this goal by clicking on the "Past Updates" button in the selected goal         PLAN The care management team will follow-up with Samantha Dawson in two months. She was encouraged to call if needed prior to next outreach.   [facility] [phone]

## 2020-05-10 NOTE — Patient Instructions (Signed)
Thank you for allowing the Chronic Care Management team to participate in your care.   Goals Addressed            This Visit's Progress   . COMPLETED: Chronic Disease Management       Current Barriers:  . Chronic Disease Management support and education needs related to Hypertension, Hyperlipidemia and Chronic Kidney Disease. Hx of osteoarthritis and degenerative disc disease.   Case Manager Clinical Goal(s):  Marland Kitchen Over the next 90 days, patient will attend all medical appointments.-Complete . Over the next 90 days, patient will take all medications as prescribed.-Complete . Over the next 90 days, patient will monitor blood pressure and record readings.-Complete . Over the next 90 days, patient will adhere to recommended cardiac prudent/heart healthy diet.-Complete . Over the next 90 days, patient will adhere to recommended safety and fall prevention measures.-Complete  Interventions:  . Discussed plans for ongoing care management. Attended outreach with Dr. Ancil Boozer on 05/04/20 as scheduled. Inhaler changed from Trelegy to [employer]. Reports using it for the first time last night. Denies current concerns regarding tolerance or prescription cost but will update team. Continues to use supplemental oxygen as recommended. Denies changes.The care management team will follow-up in two months.    Patient Self Care Activities:  . Self administers medications  . Attends scheduled provider appointments . Performs ADL's independently . Calls provider office for new concerns or questions    Please see past updates related to this goal by clicking on the "Past Updates" button in the selected goal         Ms. Rampersad verbalized understanding of the information discussed during the brief outreach today. Declined need for mail/printed instructions.   The care management team will follow-up in two months.   [facility]  [facility] [phone]

## 2020-06-02 ENCOUNTER — Telehealth (INDEPENDENT_AMBULATORY_CARE_PROVIDER_SITE_OTHER): Payer: Medicare Other | Admitting: Family Medicine

## 2020-06-02 ENCOUNTER — Other Ambulatory Visit: Payer: Self-pay

## 2020-06-02 ENCOUNTER — Encounter: Payer: Self-pay | Admitting: Family Medicine

## 2020-06-02 DIAGNOSIS — G894 Chronic pain syndrome: Secondary | ICD-10-CM

## 2020-06-02 DIAGNOSIS — J431 Panlobular emphysema: Secondary | ICD-10-CM | POA: Diagnosis not present

## 2020-06-02 MED ORDER — HYDROCODONE-ACETAMINOPHEN 5-325 MG PO TABS: 1.0000 | ORAL_TABLET | Freq: Four times a day (QID) | ORAL | 0 refills | Status: DC | PRN

## 2020-06-02 NOTE — Progress Notes (Signed)
Name: Samantha Dawson   MRN: [id number]    DOB: 01-06-1929   Date:06/02/2020       Progress Note  Subjective  Chief Complaint  Chief Complaint  Patient presents with  . Medication Refill    I connected with  [name] on 06/02/20 at  9:40 AM EDT by telephone and verified that I am speaking with the correct person using two identifiers.  I discussed the limitations, risks, security and privacy concerns of performing an evaluation and management service by telephone and the availability of in person appointments. Staff also discussed with the patient that there may be a patient responsible charge related to this service. Patient Location: at home  Provider Location: [facility]  Additional Individuals present: none   HPI  Chronic pain from arthritis/CPS:she was able to go down from Hydrocodone 10/325 105 pills per month ti 60 tablets of hydrodocodone 5/325 mg taking half in am, half at lunch, one in the evening and 100 mg of Gabapentin at night. She did noticed having a harder time getting up in am's, advised to take gabapentin a little earlier and may go up on the dose as tolerated to a max of 300 mg at night. No problems with constipation - takes stool softener PRN.Shestill keepsher medicine locked in a safe.   Emphysema: she is doing well on Anoro, states voice has improved since we changed from Trelegy to [facility]. Explained she still has refills at the pharmacy   Patient Active Problem List   Diagnosis Date Noted  . Senile purpura ([place]) 05/04/2020  . Nocturnal hypoxemia due to emphysema ([place]) 05/04/2020  . Carotid atherosclerosis, bilateral 08/27/2018  . Bilateral carotid bruits 08/27/2018  . CKD (chronic kidney disease) stage 3, GFR 30-59 ml/min 08/27/2018  . Age-related osteoporosis without current pathological fracture 08/11/2018  . Shoulder pain, left 08/29/2017  . Acid reflux 10/15/2016  . Uncomplicated opioid use 05/69/7948  . Hyperglycemia 09/02/2016  . Left thyroid  nodule 07/13/2016  . Abnormal bone scan of cervical spine 06/18/2016  . Primary osteoarthritis of both hands 05/03/2016  . Elevated serum alkaline phosphatase level 05/03/2016  . Hyperkalemia 05/03/2016  . 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 Hx of smoking 04/17/2016  . Controlled substance agreement signed 03/22/2016  . Back pain 03/22/2016  . Hyperlipidemia 10/25/2015  . Essential hypertension 10/25/2015  . Panlobular emphysema ([place]) 10/25/2015  . Chronic pain 05/25/2015  . Actinic keratosis 05/25/2015    Past Surgical History:  Procedure Laterality Date  . ABDOMINAL HYSTERECTOMY    . CATARACT EXTRACTION    . endaryerectomy    . renal stenting      Family History  Problem Relation Age of Onset  . Cancer Mother   . Coronary artery disease Father   . Cerebrovascular Accident Father   . Cancer Sister        breast cancer  . Cancer Brother        stomach  . Heart disease Daughter        heart vavle bypass?  . Stroke Sister   . Lung disease Brother   . Cancer Brother        unknown  . Arthritis Daughter     Social History   Socioeconomic History  . Marital status: Widowed    Spouse name: Ihor Gully  . Number of children: 3  . Years of education: Not on file  . Highest education level: 10th grade  Occupational History  . Occupation: Retired  Tobacco Use  .  Smoking status: Former Smoker    Packs/day: 1.00    Years: 50.00    Pack years: 50.00    Types: Cigarettes    Quit date: 2017    Years since quitting: 4.5  . Smokeless tobacco: Never Used  . Tobacco comment: smoking cesssation materials not required  Vaping Use  . Vaping Use: Never used  Substance and Sexual Activity  . Alcohol use: No    Alcohol/week: 0.0 standard drinks  . Drug use: No  . Sexual activity: Not Currently  Other Topics Concern  . Not on file  Social History Narrative  . Not on file   Social Determinants of Health   Financial Resource Strain: Low Risk   . Difficulty of  Paying Living Expenses: Not hard at all  Food Insecurity: No Food Insecurity  . Worried About Charity fundraiser in the Last Year: Never true  . Ran Out of Food in the Last Year: Never true  Transportation Needs: No Transportation Needs  . Lack of Transportation (Medical): No  . Lack of Transportation (Non-Medical): No  Physical Activity: Inactive  . Days of Exercise per Week: 0 days  . Minutes of Exercise per Session: 0 min  Stress: No Stress Concern Present  . Feeling of Stress : Not at all  Social Connections: Moderately Isolated  . Frequency of Communication with Friends and Family: More than three times a week  . Frequency of Social Gatherings with Friends and Family: More than three times a week  . Attends Religious Services: More than 4 times per year  . Active Member of Clubs or Organizations: No  . Attends Archivist Meetings: Never  . Marital Status: Widowed  Intimate Partner Violence: Not At Risk  . Fear of Current or Ex-Partner: No  . Emotionally Abused: No  . Physically Abused: No  . Sexually Abused: No     Current Outpatient Medications:  .  albuterol (PROAIR HFA) 108 (90 Base) MCG/ACT inhaler, INHALE 2 PUFFS INTO THE LUNGS EVERY 4 HOURS AS NEEDED FOR WHEEZING OR SHORTNESS OF BREATH; FUTURE REFILLS FROM LUNG DOCTOR, Disp: 8.5 g, Rfl: 3 .  aspirin 81 MG tablet, Take 81 mg by mouth daily., Disp: , Rfl:  .  co-enzyme Q-10 30 MG capsule, Take 100 mg by mouth daily. , Disp: , Rfl:  .  gabapentin (NEURONTIN) 100 MG capsule, Take 1-3 capsules (100-300 mg total) by mouth at bedtime., Disp: 90 capsule, Rfl: 0 .  pantoprazole (PROTONIX) 20 MG tablet, Take 1 tablet (20 mg total) by mouth 2 (two) times daily. Caution:prolonged use may increase risk of pneumonia, colitis, osteoporosis, anemia.  Skip doses if you can, Disp: 180 tablet, Rfl: 1 .  rosuvastatin (CRESTOR) 10 MG tablet, Take 1 tablet (10 mg total) by mouth daily., Disp: 90 tablet, Rfl: 3 .   umeclidinium-vilanterol (ANORO ELLIPTA) 62.5-25 MCG/INH AEPB, Inhale 1 puff into the lungs daily., Disp: 60 each, Rfl: 3  Allergies  Allergen Reactions  . Alendronate Nausea Only    I personally reviewed active problem list, medication list, allergies, family history, social history with the patient/caregiver today.   ROS  Ten systems reviewed and is negative except as mentioned in HPI   Objective  Virtual encounter, vitals not obtained.  There is no height or weight on file to calculate BMI.  Physical Exam  Awake, alert and oriented   PHQ2/9: Depression screen [facility] 2/9 06/02/2020 05/04/2020 04/23/2020 02/27/2020 02/23/2020  Decreased Interest 0 0 0 0 0  Down, Depressed, Hopeless 0 0 1 0 0  PHQ - 2 Score 0 0 1 0 0  Altered sleeping 0 0 - - 0  Tired, decreased energy 0 0 - - 0  Change in appetite 0 0 - - 0  Feeling bad or failure about yourself  0 0 - - 0  Trouble concentrating 0 0 - - 0  Moving slowly or fidgety/restless 0 0 - - 0  Suicidal thoughts 0 0 - - 0  PHQ-9 Score 0 0 - - 0  Difficult doing work/chores - - - - Not difficult at all  Some recent data might be hidden   PHQ-2/9 Result is negative.    Fall Risk: Fall Risk  06/02/2020 02/27/2020 02/23/2020 02/10/2020 02/02/2020  Falls in the past year? 0 0 0 0 0  Number falls in past yr: 0 - 0 0 -  Injury with Fall? 0 - 0 0 -  Comment - - - - -  Risk Factor Category  - - - - -  Risk for fall due to : - - - Medication side effect;Impaired vision Medication side effect;Other (Comment)  Risk for fall due to: Comment - - - - Osteoarthritis/ Degenerative disc disease  Follow up - - - Falls prevention discussed Falls prevention discussed     Assessment & Plan  1. Chronic pain syndrome  - HYDROcodone-acetaminophen (NORCO/VICODIN) 5-325 MG tablet; Take 1 tablet by mouth every 6 (six) hours as needed for moderate pain. Each prescription to last one month  Dispense: 60 tablet; Refill: 0 - HYDROcodone-acetaminophen  (NORCO/VICODIN) 5-325 MG tablet; Take 1 tablet by mouth every 6 (six) hours as needed for moderate pain. To last one month  Dispense: 60 tablet; Refill: 0 - HYDROcodone-acetaminophen (NORCO/VICODIN) 5-325 MG tablet; Take 1 tablet by mouth every 6 (six) hours as needed for moderate pain. To last one month  Dispense: 60 tablet; Refill: 0  2. Panlobular emphysema ([place])   I discussed the assessment and treatment plan with the patient. The patient was provided an opportunity to ask questions and all were answered. The patient agreed with the plan and demonstrated an understanding of the instructions.   The patient was advised to call back or seek an in-person evaluation if the symptoms worsen or if the condition fails to improve as anticipated.  I provided 15  minutes of non-face-to-face time during this encounter.  Loistine Chance, MD

## 2020-06-06 DIAGNOSIS — Z85828 Personal history of other malignant neoplasm of skin: Secondary | ICD-10-CM | POA: Insufficient documentation

## 2020-06-08 ENCOUNTER — Ambulatory Visit: Payer: Medicare Other | Admitting: Family Medicine

## 2020-07-19 ENCOUNTER — Ambulatory Visit: Payer: Self-pay

## 2020-07-19 DIAGNOSIS — J431 Panlobular emphysema: Secondary | ICD-10-CM

## 2020-07-19 NOTE — Chronic Care Management (AMB) (Signed)
Chronic Care Management   Follow Up Note   07/19/2020 Name: Samantha Dawson MRN: [id number] DOB: February 22, 1929  Primary Care Provider: Alba Cory, MD Reason for referral : Chronic Care Management   Samantha Dawson is a 86 y.o. year old female who is a primary care patient of Alba Cory, MD. She was engaged with the chronic care management team for assistance case management and care coordination. She has met her chronic care management goals.  Review of Samantha Dawson's status, including review of consultants reports, relevant labs and test results was conducted today. Collaboration with appropriate care team members was performed as part of the comprehensive evaluation and provision of chronic care management services.    SDOH (Social Determinants of Health) assessments performed: No See Care Plan activities for detailed interventions related to [facility])     Outpatient Encounter Medications as of 07/19/2020  Medication Sig  . albuterol (PROAIR HFA) 108 (90 Base) MCG/ACT inhaler INHALE 2 PUFFS INTO THE LUNGS EVERY 4 HOURS AS NEEDED FOR WHEEZING OR SHORTNESS OF BREATH; FUTURE REFILLS FROM LUNG DOCTOR  . aspirin 81 MG tablet Take 81 mg by mouth daily.  Marland Kitchen co-enzyme Q-10 30 MG capsule Take 100 mg by mouth daily.   Marland Kitchen gabapentin (NEURONTIN) 100 MG capsule Take 1-3 capsules (100-300 mg total) by mouth at bedtime.  Marland Kitchen HYDROcodone-acetaminophen (NORCO/VICODIN) 5-325 MG tablet Take 1 tablet by mouth every 6 (six) hours as needed for moderate pain. Each prescription to last one month  . HYDROcodone-acetaminophen (NORCO/VICODIN) 5-325 MG tablet Take 1 tablet by mouth every 6 (six) hours as needed for moderate pain. To last one month  . HYDROcodone-acetaminophen (NORCO/VICODIN) 5-325 MG tablet Take 1 tablet by mouth every 6 (six) hours as needed for moderate pain. To last one month  . pantoprazole (PROTONIX) 20 MG tablet Take 1 tablet (20 mg total) by mouth 2 (two) times daily. Caution:prolonged use  may increase risk of pneumonia, colitis, osteoporosis, anemia.  Skip doses if you can  . rosuvastatin (CRESTOR) 10 MG tablet Take 1 tablet (10 mg total) by mouth daily.  Marland Kitchen umeclidinium-vilanterol (ANORO ELLIPTA) 62.5-25 MCG/INH AEPB Inhale 1 puff into the lungs daily.   No facility-administered encounter medications on file as of 07/19/2020.      Goals Addressed            This Visit's Progress   . COMPLETED: Chronic Disease Management       Current Barriers:  . Chronic Disease Management support and education needs related to Hypertension, Hyperlipidemia and Chronic Kidney Disease. Hx of osteoarthritis and degenerative disc disease.   Case Manager Clinical Goal(s):  Marland Kitchen Over the next 90 days, patient will attend all medical appointments.-Complete . Over the next 90 days, patient will take all medications as prescribed.-Complete . Over the next 90 days, patient will monitor blood pressure and record readings.-Complete . Over the next 90 days, patient will adhere to recommended cardiac prudent/heart healthy diet.-Complete . Over the next 90 days, patient will adhere to recommended safety and fall prevention measures.-Complete  Interventions:  . Plans for ongoing care management. Samantha Dawson has met her care management goals. Completed virtual outreach with Dr. Carlynn Purl on 06/02/20. Completed outreach with the Dermatology team on 06/07/20. No urgent concerns or changes in chronic care management needs. The team will gladly outreach again if additional assistance is needed. .   Patient Self Care Activities:  . Self administers medications  . Attends scheduled provider appointments . Performs ADL's independently . Calls provider office  for new concerns or questions    Please see past updates related to this goal by clicking on the "Past Updates" button in the selected goal         PLAN Samantha Dawson has met her chronic care management goals. The care management team will gladly  outreach again if additional assistance is needed. She is scheduled for PCP outreach on 08/30/20.     Cristy Friedlander [facility] Care Management [facility] [phone]

## 2020-07-20 NOTE — Patient Instructions (Addendum)
Thank you for allowing the Chronic Care Management team to participate in your care.   Goals Addressed            This Visit's Progress   . COMPLETED: Chronic Disease Management       Current Barriers:  . Chronic Disease Management support and education needs related to Hypertension, Hyperlipidemia and Chronic Kidney Disease. Hx of osteoarthritis and degenerative disc disease.   Case Manager Clinical Goal(s):  Marland Kitchen Over the next 90 days, patient will attend all medical appointments.-Complete . Over the next 90 days, patient will take all medications as prescribed.-Complete . Over the next 90 days, patient will monitor blood pressure and record readings.-Complete . Over the next 90 days, patient will adhere to recommended cardiac prudent/heart healthy diet.-Complete . Over the next 90 days, patient will adhere to recommended safety and fall prevention measures.-Complete  Interventions:  . Plans for ongoing care management. Samantha Dawson has met her care management goals. Completed virtual outreach with Dr. Ancil Boozer on 06/02/20. Completed outreach with the Dermatology team on 06/07/20. No urgent concerns or changes in chronic care management needs. The team will gladly outreach again if additional assistance is needed. .   Patient Self Care Activities:  . Self administers medications  . Attends scheduled provider appointments . Performs ADL's independently . Calls provider office for new concerns or questions    Please see past updates related to this goal by clicking on the "Past Updates" button in the selected goal         Samantha Dawson has met her chronic care management goals. The care management team will gladly outreach again if additional assistance is needed. She is scheduled for PCP outreach on 08/30/20.     Samantha Dawson [facility] Care Management [facility] [phone]

## 2020-08-22 ENCOUNTER — Other Ambulatory Visit: Payer: Self-pay | Admitting: Family Medicine

## 2020-08-22 DIAGNOSIS — G4736 Sleep related hypoventilation in conditions classified elsewhere: Secondary | ICD-10-CM

## 2020-08-22 DIAGNOSIS — J431 Panlobular emphysema: Secondary | ICD-10-CM

## 2020-08-22 DIAGNOSIS — J439 Emphysema, unspecified: Secondary | ICD-10-CM

## 2020-08-22 NOTE — Telephone Encounter (Signed)
Requested Prescriptions  Pending Prescriptions Disp Refills  . ANORO ELLIPTA 62.5-25 MCG/INH AEPB [Pharmacy Med Name: Celedonio Miyamoto 62.5-25 ORAL INH(30S)] 60 each 3    Sig: INHALE 1 PUFF INTO THE LUNGS DAILY     Pulmonology:  [employer] Passed - 08/22/2020 10:10 AM      Passed - Valid encounter within last 12 months    Recent Outpatient Visits          2 months ago Chronic pain syndrome   [facility] Steele Sizer, MD   3 months ago Stage 3b chronic kidney disease   [facility] Steele Sizer, MD   6 months ago Essential hypertension   [facility] Delsa Grana, PA-C   9 months ago Essential hypertension   [place], [place]   11 months ago Chronic pain syndrome   [place], [place]      Future Appointments            In 1 week Steele Sizer, MD [facility], [facility]

## 2020-08-30 ENCOUNTER — Ambulatory Visit: Payer: Medicare Other | Admitting: Family Medicine

## 2020-08-30 ENCOUNTER — Ambulatory Visit (INDEPENDENT_AMBULATORY_CARE_PROVIDER_SITE_OTHER): Payer: Medicare Other | Admitting: Family Medicine

## 2020-08-30 ENCOUNTER — Other Ambulatory Visit: Payer: Self-pay

## 2020-08-30 ENCOUNTER — Encounter: Payer: Self-pay | Admitting: Family Medicine

## 2020-08-30 DIAGNOSIS — J431 Panlobular emphysema: Secondary | ICD-10-CM

## 2020-08-30 DIAGNOSIS — M810 Age-related osteoporosis without current pathological fracture: Secondary | ICD-10-CM

## 2020-08-30 DIAGNOSIS — G894 Chronic pain syndrome: Secondary | ICD-10-CM

## 2020-08-30 DIAGNOSIS — N1832 Chronic kidney disease, stage 3b: Secondary | ICD-10-CM | POA: Diagnosis not present

## 2020-08-30 DIAGNOSIS — J439 Emphysema, unspecified: Secondary | ICD-10-CM

## 2020-08-30 DIAGNOSIS — M19041 Primary osteoarthritis, right hand: Secondary | ICD-10-CM

## 2020-08-30 DIAGNOSIS — Z8679 Personal history of other diseases of the circulatory system: Secondary | ICD-10-CM

## 2020-08-30 DIAGNOSIS — E041 Nontoxic single thyroid nodule: Secondary | ICD-10-CM

## 2020-08-30 DIAGNOSIS — D692 Other nonthrombocytopenic purpura: Secondary | ICD-10-CM | POA: Diagnosis not present

## 2020-08-30 DIAGNOSIS — G4736 Sleep related hypoventilation in conditions classified elsewhere: Secondary | ICD-10-CM

## 2020-08-30 DIAGNOSIS — M19042 Primary osteoarthritis, left hand: Secondary | ICD-10-CM

## 2020-08-30 DIAGNOSIS — K219 Gastro-esophageal reflux disease without esophagitis: Secondary | ICD-10-CM

## 2020-08-30 MED ORDER — GABAPENTIN 100 MG PO CAPS: 100.0000 mg | ORAL_CAPSULE | Freq: Every day | ORAL | 0 refills | Status: DC

## 2020-08-30 MED ORDER — HYDROCODONE-ACETAMINOPHEN 5-325 MG PO TABS: 1.0000 | ORAL_TABLET | Freq: Four times a day (QID) | ORAL | 0 refills | Status: DC | PRN

## 2020-08-30 NOTE — Progress Notes (Signed)
Name: Samantha Dawson   MRN: [id number]    DOB: [birth year]   Date:08/30/2020       Progress Note  Subjective  Chief Complaint  Chief Complaint  Patient presents with  . Hyperlipidemia  . Pain    medication refill  . COPD  . Gastroesophageal Reflux    I connected with  Samantha Dawson on 08/30/20 at 11:20 AM EDT by telephone and verified that I am speaking with the correct person using two identifiers.  I discussed the limitations, risks, security and privacy concerns of performing an evaluation and management service by telephone and the availability of in person appointments. Staff also discussed with the patient that there may be a patient responsible charge related to this service.She agrees on having a virtual visit  Patient Location: at home  Provider Location: [facility]   HPI  Chronic pain from arthritis/CPS:"it's about the same", I hurt all over - different areas of my body - shoulder, mid back, hands.She is currently taking Hydrocodone apap  previously prescribed by Dr. Sanda Dawson at [phone] - she was taking 105 tablets per month No problems with her medicineNo problems with constipation - takes stool softener PRN.Shestill keepsher medicine locked in a safe.Contract agreement done 11/2019. She is now down to 60 pills of hydrocodone 5/325 mg, pain level is about 4-5/10 and stable. She is taking Gabapentin 100 mg at night, advised her to try to titrate it up to 2 every night. She states afraid of seeing pain clinic because of lack of transportation, we will refer her to chronic care management and see if she qualifies for free transportation   COPD: She was using Trelegybut it caused some hoarseness, she states Anoro is not as bad, and is tolerating it well. . She is now on oxygen, she has been having nocturnal hypoxemia, she also wears it intermittently during the day, she states mild intermittent cough, her symptoms is mostly sob and stable, occasionally wheezing  . She quit  smoking about 5 years ago. Discussed lung CT but still not interested   HTN/CKD:BP's this am was 115/70 . Dr, Samantha Dawson stopped all her bp medications, she denies chest pain or palpitation or dizziness.   CAD/HLD:She is off Plavix because of nose bleeds, she is now on Crestor and aspirin 81 mg, she has intermittent chest pain , it is described as brief, not associated with diaphoresis or sob  GERD: Taking protonix prn only now and doing well, no heartburn or indigestion . Unchanged   Thyroid nodule :managed by ENT, Dr. Richardson Dawson - she has been released   Osteoporosis  : she does not want to discuss therapy, not a good candidate for alendronate due to CKI stage III, discussed referral to endo but she would like to hold off for now , unchanged.   Senile purpura: stable on both arms,   Patient Active Problem List   Diagnosis Date Noted  . Senile purpura ([place]) 05/04/2020  . Nocturnal hypoxemia due to emphysema ([place]) 05/04/2020  . Carotid atherosclerosis, bilateral 08/27/2018  . Bilateral carotid bruits 08/27/2018  . CKD (chronic kidney disease) stage 3, GFR 30-59 ml/min (HCC) 08/27/2018  . Age-related osteoporosis without current pathological fracture 08/11/2018  . Shoulder pain, left 08/29/2017  . Acid reflux 10/15/2016  . Uncomplicated opioid use 82/95/6213  . Hyperglycemia 09/02/2016  . Left thyroid nodule 07/13/2016  . Abnormal bone scan of cervical spine 06/18/2016  . Primary osteoarthritis of both hands 05/03/2016  . Elevated serum alkaline phosphatase level  05/03/2016  . Hyperkalemia 05/03/2016  . 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 Hx of smoking 04/17/2016  . Controlled substance agreement signed 03/22/2016  . Back pain 03/22/2016  . Hyperlipidemia 10/25/2015  . Essential hypertension 10/25/2015  . Panlobular emphysema ([place]) 10/25/2015  . Chronic pain 05/25/2015  . Actinic keratosis 05/25/2015    Past Surgical History:  Procedure Laterality Date  .  ABDOMINAL HYSTERECTOMY    . CATARACT EXTRACTION    . endaryerectomy    . renal stenting      Family History  Problem Relation Age of Onset  . Cancer Mother   . Coronary artery disease Father   . Cerebrovascular Accident Father   . Cancer Sister        breast cancer  . Cancer Brother        stomach  . Heart disease Daughter        heart vavle bypass?  . Stroke Sister   . Lung disease Brother   . Cancer Brother        unknown  . Arthritis Daughter     Social History   Socioeconomic History  . Marital status: Widowed    Spouse name: Samantha Dawson  . Number of children: 3  . Years of education: Not on file  . Highest education level: 10th grade  Occupational History  . Occupation: Retired  Tobacco Use  . Smoking status: Former Smoker    Packs/day: 1.00    Years: 50.00    Pack years: 50.00    Types: Cigarettes    Quit date: 2017    Years since quitting: 4.8  . Smokeless tobacco: Never Used  . Tobacco comment: smoking cesssation materials not required  Vaping Use  . Vaping Use: Never used  Substance and Sexual Activity  . Alcohol use: No    Alcohol/week: 0.0 standard drinks  . Drug use: No  . Sexual activity: Not Currently  Other Topics Concern  . Not on file  Social History Narrative  . Not on file   Social Determinants of Health   Financial Resource Strain: Low Risk   . Difficulty of Paying Living Expenses: Not hard at all  Food Insecurity: No Food Insecurity  . Worried About Charity fundraiser in the Last Year: Never true  . Ran Out of Food in the Last Year: Never true  Transportation Needs: No Transportation Needs  . Lack of Transportation (Medical): No  . Lack of Transportation (Non-Medical): No  Physical Activity: Inactive  . Days of Exercise per Week: 0 days  . Minutes of Exercise per Session: 0 min  Stress: No Stress Concern Present  . Feeling of Stress : Not at all  Social Connections: Moderately Isolated  . Frequency of Communication with Friends  and Family: More than three times a week  . Frequency of Social Gatherings with Friends and Family: More than three times a week  . Attends Religious Services: More than 4 times per year  . Active Member of Clubs or Organizations: No  . Attends Archivist Meetings: Never  . Marital Status: Widowed  Intimate Partner Violence: Not At Risk  . Fear of Current or Ex-Partner: No  . Emotionally Abused: No  . Physically Abused: No  . Sexually Abused: No     Current Outpatient Medications:  .  albuterol (PROAIR HFA) 108 (90 Base) MCG/ACT inhaler, INHALE 2 PUFFS INTO THE LUNGS EVERY 4 HOURS AS NEEDED FOR WHEEZING OR SHORTNESS OF BREATH; FUTURE REFILLS FROM LUNG  DOCTOR, Disp: 8.5 g, Rfl: 3 .  ANORO ELLIPTA 62.5-25 MCG/INH AEPB, INHALE 1 PUFF INTO THE LUNGS DAILY, Disp: 60 each, Rfl: 3 .  aspirin 81 MG tablet, Take 81 mg by mouth daily., Disp: , Rfl:  .  co-enzyme Q-10 30 MG capsule, Take 100 mg by mouth daily. , Disp: , Rfl:  .  gabapentin (NEURONTIN) 100 MG capsule, Take 1-3 capsules (100-300 mg total) by mouth at bedtime., Disp: 90 capsule, Rfl: 0 .  HYDROcodone-acetaminophen (NORCO/VICODIN) 5-325 MG tablet, Take 1 tablet by mouth every 6 (six) hours as needed for moderate pain. Each prescription to last one month, Disp: 60 tablet, Rfl: 0 .  HYDROcodone-acetaminophen (NORCO/VICODIN) 5-325 MG tablet, Take 1 tablet by mouth every 6 (six) hours as needed for moderate pain. To last one month, Disp: 60 tablet, Rfl: 0 .  HYDROcodone-acetaminophen (NORCO/VICODIN) 5-325 MG tablet, Take 1 tablet by mouth every 6 (six) hours as needed for moderate pain. To last one month, Disp: 60 tablet, Rfl: 0 .  pantoprazole (PROTONIX) 20 MG tablet, Take 1 tablet (20 mg total) by mouth 2 (two) times daily. Caution:prolonged use may increase risk of pneumonia, colitis, osteoporosis, anemia.  Skip doses if you can, Disp: 180 tablet, Rfl: 1 .  rosuvastatin (CRESTOR) 10 MG tablet, Take 1 tablet (10 mg total) by mouth  daily., Disp: 90 tablet, Rfl: 3  Allergies  Allergen Reactions  . Alendronate Nausea Only    I personally reviewed active problem list, medication list, allergies, family history, social history, health maintenance with the patient/caregiver today.   ROS  Ten systems reviewed and is negative except as mentioned in HPI   Objective  Virtual encounter, vitals not obtained.  There is no height or weight on file to calculate BMI.  Physical Exam  Awake, alert and oriented   PHQ2/9: Depression screen [facility] 2/9 08/30/2020 06/02/2020 05/04/2020 04/23/2020 02/27/2020  Decreased Interest 0 0 0 0 0  Down, Depressed, Hopeless 0 0 0 1 0  PHQ - 2 Score 0 0 0 1 0  Altered sleeping 0 0 0 - -  Tired, decreased energy 0 0 0 - -  Change in appetite 0 0 0 - -  Feeling bad or failure about yourself  0 0 0 - -  Trouble concentrating 0 0 0 - -  Moving slowly or fidgety/restless 0 0 0 - -  Suicidal thoughts 0 0 0 - -  PHQ-9 Score 0 0 0 - -  Difficult doing work/chores Not difficult at all - - - -  Some recent data might be hidden   PHQ-2/9 Result is negative.    Fall Risk: Fall Risk  08/30/2020 06/02/2020 02/27/2020 02/23/2020 02/10/2020  Falls in the past year? 0 0 0 0 0  Number falls in past yr: 0 0 - 0 0  Injury with Fall? 0 0 - 0 0  Comment - - - - -  Risk Factor Category  - - - - -  Risk for fall due to : - - - - Medication side effect;Impaired vision  Risk for fall due to: Comment - - - - -  Follow up Falls evaluation completed - - - Falls prevention discussed     Assessment & Plan  1. Chronic pain syndrome  - Ambulatory referral to Chronic Care Management Services - gabapentin (NEURONTIN) 100 MG capsule; Take 1-2 capsules (100-200 mg total) by mouth at bedtime.  Dispense: 180 capsule; Refill: 0 - HYDROcodone-acetaminophen (NORCO/VICODIN) 5-325 MG tablet; Take 1 tablet  by mouth every 6 (six) hours as needed for moderate pain. Each prescription to last one month  Dispense: 60 tablet;  Refill: 0 - HYDROcodone-acetaminophen (NORCO/VICODIN) 5-325 MG tablet; Take 1 tablet by mouth every 6 (six) hours as needed for moderate pain. To last one month  Dispense: 60 tablet; Refill: 0 - HYDROcodone-acetaminophen (NORCO/VICODIN) 5-325 MG tablet; Take 1 tablet by mouth every 6 (six) hours as needed for moderate pain. To last one month  Dispense: 60 tablet; Refill: 0   2. Stage 3b chronic kidney disease ([place])  Recheck next visit   3. Panlobular emphysema (HCC)  On Anoro, could not tolerate Trelegy   4. Senile purpura (HCC)  Stable - per patient   5. Age-related osteoporosis without current pathological fracture   6. History of hypertension  Off medication  7. Gastroesophageal reflux disease, unspecified whether esophagitis present  Taking prn only medication and doing well  8. Left thyroid nodule  Seen by Dr. Richardson Dawson   9. Primary osteoarthritis of both hands   10. Nocturnal hypoxemia due to emphysema (HCC)  Compliant   I discussed the assessment and treatment plan with the patient. The patient was provided an opportunity to ask questions and all were answered. The patient agreed with the plan and demonstrated an understanding of the instructions.   The patient was advised to call back or seek an in-person evaluation if the symptoms worsen or if the condition fails to improve as anticipated.  I provided 25  minutes of non-face-to-face time during this encounter.  Loistine Chance, MD

## 2020-08-31 ENCOUNTER — Telehealth: Payer: Self-pay | Admitting: Family Medicine

## 2020-08-31 NOTE — Telephone Encounter (Signed)
[name] 08/31/2020 Called pt regarding community resource referral received. My info is [phone] please see ref notes for more details. Thank you.  [place], Care Management

## 2020-11-29 NOTE — Progress Notes (Addendum)
Name: Samantha Dawson   MRN: [id number]    DOB: 1929/05/06   Date:11/30/2020       Progress Note  Subjective  Chief Complaint  Follow up she came in with her grand-daughter today   HPI   Chronic pain from arthritis/CPS:"it's about the same", I hurt all over - different areas of my body - shoulder, mid back, hands.She is currently taking Hydrocodone apap  previously prescribed by Dr. Sanda Klein at [phone] - she was taking 105 tablets per month No problems with her medicineNo problems with constipation - takes stool softener PRN.Shestill keepsher medicine locked in a safe.Contract agreement done 11/2019 and we will recheck drug screen and sing a new contract today . She is now down to 60 pills of hydrocodone 5/325 mg, pain level is about 3-4/10 and stable. She was given Gabapentin to take at night but she never titrated dose up and stopped taking because it did not help much, advised to titrate dose as previously discussed t. She states afraid of seeing pain clinic because of lack of transportation, we will refer her to chronic care management and see if she qualifies for free transportation   COPD: She was using Trelegybut it caused some hoarseness, she states Anoro is not as bad, and is tolerating it well. She is now on oxygen, she has been having nocturnal hypoxemia, she also wears it intermittently during the day, yesterday pulse ox was 91 % at rest  She states mild intermittent cough, her symptoms is mostly sob and stable, occasionally wheezing . She quit smoking about 5 years ago. Discussed lung CT but still not interested .   Hx of HTN/CKD:BP's this am was 115/70 . Dr.Khan stopped all her bp medications, she denies chest pain or palpitation or dizziness. We will recheck labs and send results to Dr. Humphrey Rolls   CAD/HLD:She is off Plavix because of nose bleeds, she is now on Crestor and aspirin 81 mg,she denies any recent episode of chest pain   GERD: Taking protonix bid and states  occasionally has heartburn and  Indigestion, but doing fine   Thyroid nodule :managed by ENT, Dr. Richardson Landry - she has been released , recheck TSH   Osteoporosis  : she does not want to discuss therapy, not a good candidate for alendronate due to CKI stage III, discussed referral to endo but she would like to hold off for now . She is not interested   Senile purpura  stable on both arms, stable   Hearing loss: she forgot her hearing aid today   Patient Active Problem List   Diagnosis Date Noted  . History of nonmelanoma skin cancer 06/06/2020  . Senile purpura ([place]) 05/04/2020  . Nocturnal hypoxemia due to emphysema ([place]) 05/04/2020  . Carotid atherosclerosis, bilateral 08/27/2018  . Bilateral carotid bruits 08/27/2018  . CKD (chronic kidney disease) stage 3, GFR 30-59 ml/min (HCC) 08/27/2018  . Age-related osteoporosis without current pathological fracture 08/11/2018  . Shoulder pain, left 08/29/2017  . Acid reflux 10/15/2016  . Uncomplicated opioid use [id number]  . Hyperglycemia 09/02/2016  . Left thyroid nodule 07/13/2016  . Abnormal bone scan of cervical spine 06/18/2016  . Primary osteoarthritis of both hands 05/03/2016  . Elevated serum alkaline phosphatase level 05/03/2016  . Hyperkalemia 05/03/2016  . 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 Hx of smoking 04/17/2016  . Controlled substance agreement signed 03/22/2016  . Back pain 03/22/2016  . Hyperlipidemia 10/25/2015  . Essential hypertension 10/25/2015  . Panlobular emphysema ([place]) 10/25/2015  .  Chronic pain 05/25/2015  . Actinic keratosis 05/25/2015    Past Surgical History:  Procedure Laterality Date  . ABDOMINAL HYSTERECTOMY    . CATARACT EXTRACTION    . endaryerectomy    . renal stenting      Family History  Problem Relation Age of Onset  . Cancer Mother   . Coronary artery disease Father   . Cerebrovascular Accident Father   . Cancer Sister        breast cancer  . Cancer Brother         stomach  . Heart disease Daughter        heart vavle bypass?  . Stroke Sister   . Lung disease Brother   . Cancer Brother        unknown  . Arthritis Daughter     Social History   Tobacco Use  . Smoking status: Former Smoker    Packs/day: 1.00    Years: 50.00    Pack years: 50.00    Types: Cigarettes    Quit date: 2017    Years since quitting: 5.0  . Smokeless tobacco: Never Used  . Tobacco comment: smoking cesssation materials not required  Substance Use Topics  . Alcohol use: No    Alcohol/week: 0.0 standard drinks     Current Outpatient Medications:  .  albuterol (PROAIR HFA) 108 (90 Base) MCG/ACT inhaler, INHALE 2 PUFFS INTO THE LUNGS EVERY 4 HOURS AS NEEDED FOR WHEEZING OR SHORTNESS OF BREATH; FUTURE REFILLS FROM LUNG DOCTOR, Disp: 8.5 g, Rfl: 3 .  ANORO ELLIPTA 62.5-25 MCG/INH AEPB, INHALE 1 PUFF INTO THE LUNGS DAILY, Disp: 60 each, Rfl: 3 .  aspirin 81 MG tablet, Take 81 mg by mouth daily., Disp: , Rfl:  .  co-enzyme Q-10 30 MG capsule, Take 100 mg by mouth daily. , Disp: , Rfl:  .  fluticasone (FLONASE) 50 MCG/ACT nasal spray, Place into the nose., Disp: , Rfl:  .  HYDROcodone-acetaminophen (NORCO/VICODIN) 5-325 MG tablet, Take 1 tablet by mouth every 6 (six) hours as needed for moderate pain. Each prescription to last one month, Disp: 60 tablet, Rfl: 0 .  HYDROcodone-acetaminophen (NORCO/VICODIN) 5-325 MG tablet, Take 1 tablet by mouth every 6 (six) hours as needed for moderate pain. To last one month, Disp: 60 tablet, Rfl: 0 .  HYDROcodone-acetaminophen (NORCO/VICODIN) 5-325 MG tablet, Take 1 tablet by mouth every 6 (six) hours as needed for moderate pain. To last one month, Disp: 60 tablet, Rfl: 0 .  pantoprazole (PROTONIX) 20 MG tablet, Take 1 tablet (20 mg total) by mouth 2 (two) times daily. Caution:prolonged use may increase risk of pneumonia, colitis, osteoporosis, anemia.  Skip doses if you can, Disp: 180 tablet, Rfl: 1 .  rosuvastatin (CRESTOR) 10 MG tablet,  Take 1 tablet (10 mg total) by mouth daily., Disp: 90 tablet, Rfl: 3 .  gabapentin (NEURONTIN) 100 MG capsule, Take 1-2 capsules (100-200 mg total) by mouth at bedtime. (Patient not taking: Reported on 11/30/2020), Disp: 180 capsule, Rfl: 0  Allergies  Allergen Reactions  . Alendronate Nausea Only    I personally reviewed active problem list, medication list, allergies, family history, social history, health maintenance with the patient/caregiver today.   ROS  Constitutional: Negative for fever or weight change.  Respiratory: positive  for cough and shortness of breath.   Cardiovascular: Negative for chest pain or palpitations.  Gastrointestinal: Negative for abdominal pain, no bowel changes.  Musculoskeletal: Negative for gait problem or joint swelling.  Skin: positive  for  rash.  Neurological: Negative for dizziness or headache.  No other specific complaints in a complete review of systems (except as listed in HPI above).  Objective  Vitals:   11/30/20 1047  BP: 118/82  Pulse: 88  Resp: 16  Temp: 98 F (36.7 C)  TempSrc: Oral  SpO2: 100%  Weight: 111 lb 12.8 oz (50.7 kg)  Height: 5' (1.524 m)    Body mass index is 21.83 kg/m.  Physical Exam  Constitutional: Patient appears well-developed and frail  No distress.  HEENT: head atraumatic, normocephalic, pupils equal and reactive to light,  neck supple Cardiovascular: Normal rate, regular rhythm and normal heart sounds.  No murmur heard. No BLE edema. Pulmonary/Chest: Effort normal , some coarse sounds on lung fields  No respiratory distress. Abdominal: Soft.  There is no tenderness. Muscular skeletal: kyphosis, walks well  Skin: senile purpura , areas of AK's on hands Psychiatric: Patient has a normal mood and affect. behavior is normal. Judgment and thought content normal.  PHQ2/9: Depression screen [facility] 2/9 11/30/2020 08/30/2020 06/02/2020 05/04/2020 04/23/2020  Decreased Interest 0 0 0 0 0  Down, Depressed, Hopeless  0 0 0 0 1  PHQ - 2 Score 0 0 0 0 1  Altered sleeping - 0 0 0 -  Tired, decreased energy - 0 0 0 -  Change in appetite - 0 0 0 -  Feeling bad or failure about yourself  - 0 0 0 -  Trouble concentrating - 0 0 0 -  Moving slowly or fidgety/restless - 0 0 0 -  Suicidal thoughts - 0 0 0 -  PHQ-9 Score - 0 0 0 -  Difficult doing work/chores - Not difficult at all - - -  Some recent data might be hidden    phq 9 is negative   Fall Risk: Fall Risk  11/30/2020 08/30/2020 06/02/2020 02/27/2020 02/23/2020  Falls in the past year? 0 0 0 0 0  Number falls in past yr: 0 0 0 - 0  Injury with Fall? 0 0 0 - 0  Comment - - - - -  Risk Factor Category  - - - - -  Risk for fall due to : - - - - -  Risk for fall due to: Comment - - - - -  Follow up - Falls evaluation completed - - -     Functional Status Survey: Is the patient deaf or have difficulty hearing?: Yes Does the patient have difficulty seeing, even when wearing glasses/contacts?: Yes Does the patient have difficulty concentrating, remembering, or making decisions?: No Does the patient have difficulty walking or climbing stairs?: No Does the patient have difficulty dressing or bathing?: No Does the patient have difficulty doing errands alone such as visiting a doctor's office or shopping?: No    Assessment & Plan  1. Senile purpura ([place])  stable  2. Chronic pain syndrome  - Pain Management Screening Profile (10S) - HYDROcodone-acetaminophen (NORCO/VICODIN) 5-325 MG tablet; Take 1 tablet by mouth every 6 (six) hours as needed for moderate pain. Each prescription to last one month  Dispense: 60 tablet; Refill: 0 - HYDROcodone-acetaminophen (NORCO/VICODIN) 5-325 MG tablet; Take 1 tablet by mouth every 6 (six) hours as needed for moderate pain. To last one month  Dispense: 60 tablet; Refill: 0 - HYDROcodone-acetaminophen (NORCO/VICODIN) 5-325 MG tablet; Take 1 tablet by mouth every 6 (six) hours as needed for moderate pain. To last one  month  Dispense: 60 tablet; Refill: 0  3. Stage 3b chronic  kidney disease ([place])  - CBC with Differential/Platelet - COMPLETE METABOLIC PANEL WITH GFR  4. Panlobular emphysema ([place])  - umeclidinium-vilanterol (ANORO ELLIPTA) 62.5-25 MCG/INH AEPB; Inhale 1 puff into the lungs daily.  Dispense: 60 each; Refill: 3  5. Age-related osteoporosis without current pathological fracture   6. History of hypertension   7. Left thyroid nodule  - TSH  8. Primary osteoarthritis of both hands   9. Nocturnal hypoxemia due to emphysema (HCC)  - umeclidinium-vilanterol (ANORO ELLIPTA) 62.5-25 MCG/INH AEPB; Inhale 1 puff into the lungs daily.  Dispense: 60 each; Refill: 3  10. Carotid atherosclerosis, bilateral   11. Medication management  - CBC with Differential/Platelet - COMPLETE METABOLIC PANEL WITH GFR  12. Gastroesophageal reflux disease without esophagitis  - pantoprazole (PROTONIX) 20 MG tablet; Take 1 tablet (20 mg total) by mouth 2 (two) times daily. Caution:prolonged use may increase risk of pneumonia, colitis, osteoporosis, anemia.  Skip doses if you can  Dispense: 180 tablet; Refill: 1  13. Hyperglycemia  - Hemoglobin A1c  14. Mixed hyperlipidemia  - Lipid panel  15. Controlled substance agreement signed   16. Hx of smoking   17. Gastroesophageal reflux disease, unspecified whether esophagitis present  - pantoprazole (PROTONIX) 20 MG tablet; Take 1 tablet (20 mg total) by mouth 2 (two) times daily. Caution:prolonged use may increase risk of pneumonia, colitis, osteoporosis, anemia.  Skip doses if you can  Dispense: 180 tablet; Refill: 1

## 2020-11-30 ENCOUNTER — Other Ambulatory Visit: Payer: Self-pay

## 2020-11-30 ENCOUNTER — Encounter: Payer: Self-pay | Admitting: Family Medicine

## 2020-11-30 ENCOUNTER — Ambulatory Visit (INDEPENDENT_AMBULATORY_CARE_PROVIDER_SITE_OTHER): Payer: Medicare Other | Admitting: Family Medicine

## 2020-11-30 VITALS — BP 118/82 | HR 88 | Temp 98.0°F | Resp 16 | Ht 60.0 in | Wt 111.8 lb

## 2020-11-30 DIAGNOSIS — M19042 Primary osteoarthritis, left hand: Secondary | ICD-10-CM

## 2020-11-30 DIAGNOSIS — I6523 Occlusion and stenosis of bilateral carotid arteries: Secondary | ICD-10-CM

## 2020-11-30 DIAGNOSIS — G4736 Sleep related hypoventilation in conditions classified elsewhere: Secondary | ICD-10-CM

## 2020-11-30 DIAGNOSIS — N1832 Chronic kidney disease, stage 3b: Secondary | ICD-10-CM

## 2020-11-30 DIAGNOSIS — D692 Other nonthrombocytopenic purpura: Secondary | ICD-10-CM

## 2020-11-30 DIAGNOSIS — Z8679 Personal history of other diseases of the circulatory system: Secondary | ICD-10-CM

## 2020-11-30 DIAGNOSIS — R739 Hyperglycemia, unspecified: Secondary | ICD-10-CM

## 2020-11-30 DIAGNOSIS — M19041 Primary osteoarthritis, right hand: Secondary | ICD-10-CM

## 2020-11-30 DIAGNOSIS — J431 Panlobular emphysema: Secondary | ICD-10-CM | POA: Diagnosis not present

## 2020-11-30 DIAGNOSIS — Z1283 Encounter for screening for malignant neoplasm of skin: Secondary | ICD-10-CM

## 2020-11-30 DIAGNOSIS — G894 Chronic pain syndrome: Secondary | ICD-10-CM

## 2020-11-30 DIAGNOSIS — K219 Gastro-esophageal reflux disease without esophagitis: Secondary | ICD-10-CM

## 2020-11-30 DIAGNOSIS — Z87891 Personal history of nicotine dependence: Secondary | ICD-10-CM

## 2020-11-30 DIAGNOSIS — Z79899 Other long term (current) drug therapy: Secondary | ICD-10-CM

## 2020-11-30 DIAGNOSIS — E041 Nontoxic single thyroid nodule: Secondary | ICD-10-CM

## 2020-11-30 DIAGNOSIS — M810 Age-related osteoporosis without current pathological fracture: Secondary | ICD-10-CM

## 2020-11-30 DIAGNOSIS — E782 Mixed hyperlipidemia: Secondary | ICD-10-CM

## 2020-11-30 DIAGNOSIS — J439 Emphysema, unspecified: Secondary | ICD-10-CM

## 2020-11-30 LAB — CBC WITH DIFFERENTIAL/PLATELET

## 2020-11-30 LAB — TSH

## 2020-11-30 LAB — LIPID PANEL

## 2020-11-30 LAB — DM TEMPLATE

## 2020-11-30 LAB — DRUG MONITOR, BASE PANEL, W/CONF, URINE

## 2020-11-30 LAB — COMPLETE METABOLIC PANEL WITH GFR

## 2020-11-30 LAB — HEMOGLOBIN A1C

## 2020-11-30 MED ORDER — HYDROCODONE-ACETAMINOPHEN 5-325 MG PO TABS: 1.0000 | ORAL_TABLET | Freq: Four times a day (QID) | ORAL | 0 refills | Status: DC | PRN

## 2020-11-30 MED ORDER — ANORO ELLIPTA 62.5-25 MCG/INH IN AEPB: 1.0000 | INHALATION_SPRAY | Freq: Every day | RESPIRATORY_TRACT | 3 refills | Status: DC

## 2020-11-30 MED ORDER — HYDROCODONE-ACETAMINOPHEN 5-325 MG PO TABS
1.0000 | ORAL_TABLET | Freq: Four times a day (QID) | ORAL | 0 refills | Status: DC | PRN
Start: 2020-11-30 — End: 2021-03-02

## 2020-11-30 MED ORDER — PANTOPRAZOLE SODIUM 20 MG PO TBEC: 20.0000 mg | DELAYED_RELEASE_TABLET | Freq: Two times a day (BID) | ORAL | 1 refills | Status: DC

## 2020-12-01 LAB — CBC WITH DIFFERENTIAL/PLATELET
Absolute Monocytes: 694 cells/uL (ref 200–950)
Basophils Absolute: 37 cells/uL (ref 0–200)
Basophils Relative: 0.5 %
Eosinophils Absolute: 102 cells/uL (ref 15–500)
Eosinophils Relative: 1.4 %
HCT: 42.4 % (ref 35.0–45.0)
Hemoglobin: 14.1 g/dL (ref 11.7–15.5)
Lymphs Abs: 2183 cells/uL (ref 850–3900)
MCH: 32.1 pg (ref 27.0–33.0)
MCHC: 33.3 g/dL (ref 32.0–36.0)
MCV: 96.6 fL (ref 80.0–100.0)
MPV: 11.1 fL (ref 7.5–12.5)
Monocytes Relative: 9.5 %
Neutro Abs: 4285 cells/uL (ref 1500–7800)
Neutrophils Relative %: 58.7 %
Platelets: 182 10*3/uL (ref 140–400)
RBC: 4.39 10*6/uL (ref 3.80–5.10)
RDW: 12.8 % (ref 11.0–15.0)
Total Lymphocyte: 29.9 %
WBC: 7.3 10*3/uL (ref 3.8–10.8)

## 2020-12-01 LAB — HEMOGLOBIN A1C
Hgb A1c MFr Bld: 6 % of total Hgb — ABNORMAL HIGH (ref <5.7)
Mean Plasma Glucose: 126 mg/dL
eAG (mmol/L): 7 mmol/L

## 2020-12-01 LAB — DRUG MONITOR, PANEL 1, SCREEN, URINE
Amphetamines: NEGATIVE ng/mL (ref <500)
Barbiturates: NEGATIVE ng/mL (ref <300)
Benzodiazepines: NEGATIVE ng/mL (ref <100)
Cocaine Metabolite: NEGATIVE ng/mL (ref <150)
Creatinine: 65.3 mg/dL
Marijuana Metabolite: NEGATIVE ng/mL (ref <20)
Methadone Metabolite: NEGATIVE ng/mL (ref <100)
Opiates: POSITIVE ng/mL — AB (ref <100)
Oxidant: NEGATIVE ug/mL
Oxycodone: NEGATIVE ng/mL (ref <100)
Phencyclidine: NEGATIVE ng/mL (ref <25)
pH: 5.5 (ref 4.5–9.0)

## 2020-12-01 LAB — COMPLETE METABOLIC PANEL WITH GFR
AG Ratio: 1.5 (calc) (ref 1.0–2.5)
ALT: 18 U/L (ref 6–29)
AST: 28 U/L (ref 10–35)
Albumin: 4.6 g/dL (ref 3.6–5.1)
Alkaline phosphatase (APISO): 91 U/L (ref 37–153)
BUN/Creatinine Ratio: 16 (calc) (ref 6–22)
BUN: 17 mg/dL (ref 7–25)
CO2: 26 mmol/L (ref 20–32)
Calcium: 10 mg/dL (ref 8.6–10.4)
Chloride: 104 mmol/L (ref 98–110)
Creat: 1.05 mg/dL — ABNORMAL HIGH (ref 0.60–0.88)
GFR, Est African American: 55 mL/min/{1.73_m2} — ABNORMAL LOW (ref >=60)
GFR, Est Non African American: 47 mL/min/{1.73_m2} — ABNORMAL LOW (ref >=60)
Globulin: 3 g/dL (calc) (ref 1.9–3.7)
Glucose, Bld: 97 mg/dL (ref 65–99)
Potassium: 4.2 mmol/L (ref 3.5–5.3)
Sodium: 142 mmol/L (ref 135–146)
Total Bilirubin: 0.9 mg/dL (ref 0.2–1.2)
Total Protein: 7.6 g/dL (ref 6.1–8.1)

## 2020-12-01 LAB — TSH: TSH: 1.11 mIU/L (ref 0.40–4.50)

## 2020-12-01 LAB — LIPID PANEL
Cholesterol: 140 mg/dL (ref <200)
HDL: 73 mg/dL (ref >=50)
LDL Cholesterol (Calc): 48 mg/dL (calc)
Non-HDL Cholesterol (Calc): 67 mg/dL (calc) (ref <130)
Total CHOL/HDL Ratio: 1.9 (calc) (ref <5.0)
Triglycerides: 103 mg/dL (ref <150)

## 2020-12-01 LAB — DM TEMPLATE

## 2020-12-27 ENCOUNTER — Other Ambulatory Visit: Payer: Self-pay | Admitting: Family Medicine

## 2020-12-27 DIAGNOSIS — G4736 Sleep related hypoventilation in conditions classified elsewhere: Secondary | ICD-10-CM

## 2020-12-27 DIAGNOSIS — J431 Panlobular emphysema: Secondary | ICD-10-CM

## 2021-02-10 ENCOUNTER — Ambulatory Visit (INDEPENDENT_AMBULATORY_CARE_PROVIDER_SITE_OTHER): Payer: Medicare Other

## 2021-02-10 DIAGNOSIS — Z0000 Encounter for general adult medical examination without abnormal findings: Secondary | ICD-10-CM | POA: Diagnosis not present

## 2021-02-10 NOTE — Patient Instructions (Signed)
Ms. Samantha Dawson , Thank you for taking time to come for your Medicare Wellness Visit. I appreciate your ongoing commitment to your health goals. Please review the following plan we discussed and let me know if I can assist you in the future.   Screening recommendations/referrals: Colonoscopy: no longer required Mammogram: no longer required Bone Density: no longer required Recommended yearly ophthalmology/optometry visit for glaucoma screening and checkup Recommended yearly dental visit for hygiene and checkup  Vaccinations: Influenza vaccine: done 08/06/20 Pneumococcal vaccine: done 05/26/14 Tdap vaccine: done 09/09/12 Shingles vaccine: Shingrix discussed. Please contact your pharmacy for coverage information.  Covid-19:declined  Advanced directives: Please bring a copy of your health care power of attorney and living will to the office at your convenience.  Conditions/risks identified: Recommend continuing fall prevention in the home.   Next appointment: Follow up in one year for your annual wellness visit    Preventive Care 65 Years and Older, Female Preventive care refers to lifestyle choices and visits with your health care provider that can promote health and wellness. What does preventive care include?  A yearly physical exam. This is also called an annual well check.  Dental exams once or twice a year.  Routine eye exams. Ask your health care provider how often you should have your eyes checked.  Personal lifestyle choices, including:  Daily care of your teeth and gums.  Regular physical activity.  Eating a healthy diet.  Avoiding tobacco and drug use.  Limiting alcohol use.  Practicing safe sex.  Taking low-dose aspirin every day.  Taking vitamin and mineral supplements as recommended by your health care provider. What happens during an annual well check? The services and screenings done by your health care provider during your annual well check will depend on  your age, overall health, lifestyle risk factors, and family history of disease. Counseling  Your health care provider may ask you questions about your:  Alcohol use.  Tobacco use.  Drug use.  Emotional well-being.  Home and relationship well-being.  Sexual activity.  Eating habits.  History of falls.  Memory and ability to understand (cognition).  Work and work Statistician.  Reproductive health. Screening  You may have the following tests or measurements:  Height, weight, and BMI.  Blood pressure.  Lipid and cholesterol levels. These may be checked every 5 years, or more frequently if you are over 68 years old.  Skin check.  Lung cancer screening. You may have this screening every year starting at age 30 if you have a 30-pack-year history of smoking and currently smoke or have quit within the past 15 years.  Fecal occult blood test (FOBT) of the stool. You may have this test every year starting at age 9.  Flexible sigmoidoscopy or colonoscopy. You may have a sigmoidoscopy every 5 years or a colonoscopy every 10 years starting at age 25.  Hepatitis C blood test.  Hepatitis B blood test.  Sexually transmitted disease (STD) testing.  Diabetes screening. This is done by checking your blood sugar (glucose) after you have not eaten for a while (fasting). You may have this done every 1-3 years.  Bone density scan. This is done to screen for osteoporosis. You may have this done starting at age 53.  Mammogram. This may be done every 1-2 years. Talk to your health care provider about how often you should have regular mammograms. Talk with your health care provider about your test results, treatment options, and if necessary, the need for more tests. Vaccines  Your health care provider may recommend certain vaccines, such as:  Influenza vaccine. This is recommended every year.  Tetanus, diphtheria, and acellular pertussis (Tdap, Td) vaccine. You may need a Td booster  every 10 years.  Zoster vaccine. You may need this after age 31.  Pneumococcal 13-valent conjugate (PCV13) vaccine. One dose is recommended after age 47.  Pneumococcal polysaccharide (PPSV23) vaccine. One dose is recommended after age 60. Talk to your health care provider about which screenings and vaccines you need and how often you need them. This information is not intended to replace advice given to you by your health care provider. Make sure you discuss any questions you have with your health care provider. Document Released: 11/19/2015 Document Revised: 07/12/2016 Document Reviewed: 08/24/2015 Elsevier Interactive Patient Education  2017 [place] Prevention in the Home Falls can cause injuries. They can happen to people of all ages. There are many things you can do to make your home safe and to help prevent falls. What can I do on the outside of my home?  Regularly fix the edges of walkways and driveways and fix any cracks.  Remove anything that might make you trip as you walk through a door, such as a raised step or threshold.  Trim any bushes or trees on the path to your home.  Use bright outdoor lighting.  Clear any walking paths of anything that might make someone trip, such as rocks or tools.  Regularly check to see if handrails are loose or broken. Make sure that both sides of any steps have handrails.  Any raised decks and porches should have guardrails on the edges.  Have any leaves, snow, or ice cleared regularly.  Use sand or salt on walking paths during winter.  Clean up any spills in your garage right away. This includes oil or grease spills. What can I do in the bathroom?  Use night lights.  Install grab bars by the toilet and in the tub and shower. Do not use towel bars as grab bars.  Use non-skid mats or decals in the tub or shower.  If you need to sit down in the shower, use a plastic, non-slip stool.  Keep the floor dry. Clean up any  water that spills on the floor as soon as it happens.  Remove soap buildup in the tub or shower regularly.  Attach bath mats securely with double-sided non-slip rug tape.  Do not have throw rugs and other things on the floor that can make you trip. What can I do in the bedroom?  Use night lights.  Make sure that you have a light by your bed that is easy to reach.  Do not use any sheets or blankets that are too big for your bed. They should not hang down onto the floor.  Have a firm chair that has side arms. You can use this for support while you get dressed.  Do not have throw rugs and other things on the floor that can make you trip. What can I do in the kitchen?  Clean up any spills right away.  Avoid walking on wet floors.  Keep items that you use a lot in easy-to-reach places.  If you need to reach something above you, use a strong step stool that has a grab bar.  Keep electrical cords out of the way.  Do not use floor polish or wax that makes floors slippery. If you must use wax, use non-skid floor wax.  Do  not have throw rugs and other things on the floor that can make you trip. What can I do with my stairs?  Do not leave any items on the stairs.  Make sure that there are handrails on both sides of the stairs and use them. Fix handrails that are broken or loose. Make sure that handrails are as long as the stairways.  Check any carpeting to make sure that it is firmly attached to the stairs. Fix any carpet that is loose or worn.  Avoid having throw rugs at the top or bottom of the stairs. If you do have throw rugs, attach them to the floor with carpet tape.  Make sure that you have a light switch at the top of the stairs and the bottom of the stairs. If you do not have them, ask someone to add them for you. What else can I do to help prevent falls?  Wear shoes that:  Do not have high heels.  Have rubber bottoms.  Are comfortable and fit you well.  Are closed  at the toe. Do not wear sandals.  If you use a stepladder:  Make sure that it is fully opened. Do not climb a closed stepladder.  Make sure that both sides of the stepladder are locked into place.  Ask someone to hold it for you, if possible.  Clearly mark and make sure that you can see:  Any grab bars or handrails.  First and last steps.  Where the edge of each step is.  Use tools that help you move around (mobility aids) if they are needed. These include:  Canes.  Walkers.  Scooters.  Crutches.  Turn on the lights when you go into a dark area. Replace any light bulbs as soon as they burn out.  Set up your furniture so you have a clear path. Avoid moving your furniture around.  If any of your floors are uneven, fix them.  If there are any pets around you, be aware of where they are.  Review your medicines with your doctor. Some medicines can make you feel dizzy. This can increase your chance of falling. Ask your doctor what other things that you can do to help prevent falls. This information is not intended to replace advice given to you by your health care provider. Make sure you discuss any questions you have with your health care provider. Document Released: 08/19/2009 Document Revised: 03/30/2016 Document Reviewed: 11/27/2014 Elsevier Interactive Patient Education  2017 [employer].

## 2021-02-10 NOTE — Progress Notes (Signed)
Subjective:   Samantha Dawson is a 87 y.o. female who presents for Medicare Annual (Subsequent) preventive examination.  Virtual Visit via Telephone Note  I connected with  Samantha Dawson on 02/10/21 at  3:30 PM EDT by telephone and verified that I am speaking with the correct person using two identifiers.  Location: Patient: home Provider: [place] Persons participating in the virtual visit: [place]   I discussed the limitations, risks, security and privacy concerns of performing an evaluation and management service by telephone and the availability of in person appointments. The patient expressed understanding and agreed to proceed.  Interactive audio and video telecommunications were attempted between this nurse and patient, however failed, due to patient having technical difficulties OR patient did not have access to video capability.  We continued and completed visit with audio only.  Some vital signs may be absent or patient reported.   Clemetine Marker, LPN    Review of Systems     Cardiac Risk Factors include: advanced age (>40men, >37 women);dyslipidemia     Objective:    There were no vitals filed for this visit. There is no height or weight on file to calculate BMI.  Advanced Directives 02/10/2021 02/10/2020 03/26/2018 08/29/2017 05/29/2017 02/27/2017 11/29/2016  Does Patient Have a Medical Advance Directive? Yes Yes Yes Yes Yes Yes Yes  Type of Paramedic of [place];Living will [place];Living will [place];Living will [place];Living will - Living will Living will  Does patient want to make changes to medical advance directive? - - - - - - -  Copy of [place] in Chart? No - copy requested No - copy requested No - copy requested No - copy requested - - -  Would patient like information on creating a medical advance directive? - - - - - - -    Current  Medications (verified) Outpatient Encounter Medications as of 02/10/2021  Medication Sig  . albuterol (PROAIR HFA) 108 (90 Base) MCG/ACT inhaler INHALE 2 PUFFS INTO THE LUNGS EVERY 4 HOURS AS NEEDED FOR WHEEZING OR SHORTNESS OF BREATH; FUTURE REFILLS FROM LUNG DOCTOR  . ANORO ELLIPTA 62.5-25 MCG/INH AEPB INHALE 1 PUFF INTO THE LUNGS DAILY  . aspirin 81 MG tablet Take 81 mg by mouth daily.  Marland Kitchen co-enzyme Q-10 30 MG capsule Take 100 mg by mouth daily.   Marland Kitchen HYDROcodone-acetaminophen (NORCO/VICODIN) 5-325 MG tablet Take 1 tablet by mouth every 6 (six) hours as needed for moderate pain. Each prescription to last one month  . pantoprazole (PROTONIX) 20 MG tablet Take 1 tablet (20 mg total) by mouth 2 (two) times daily. Caution:prolonged use may increase risk of pneumonia, colitis, osteoporosis, anemia.  Skip doses if you can  . rosuvastatin (CRESTOR) 10 MG tablet Take 1 tablet (10 mg total) by mouth daily.  Marland Kitchen HYDROcodone-acetaminophen (NORCO/VICODIN) 5-325 MG tablet Take 1 tablet by mouth every 6 (six) hours as needed for moderate pain. To last one month  . HYDROcodone-acetaminophen (NORCO/VICODIN) 5-325 MG tablet Take 1 tablet by mouth every 6 (six) hours as needed for moderate pain. To last one month  . [DISCONTINUED] fluticasone (FLONASE) 50 MCG/ACT nasal spray Place into the nose.  . [DISCONTINUED] gabapentin (NEURONTIN) 100 MG capsule Take 1-2 capsules (100-200 mg total) by mouth at bedtime. (Patient not taking: No sig reported)   No facility-administered encounter medications on file as of 02/10/2021.    Allergies (verified) Alendronate   History: Past Medical  History:  Diagnosis Date  . Acid reflux 10/15/2016  . Anxiety   . Closed fracture of left tibial plateau 09/13/2017  . Closed nondisplaced fracture of neck of right radius 09/13/2017  . Controlled substance agreement signed 03/22/2016  . 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 Encounter for chronic pain management   . Hx of  smoking 04/17/2016  . Hyperlipidemia   . Hypertension   . Macular degeneration   . Osteoarthritis   . Osteoporosis 03/22/2016  . Primary osteoarthritis of both hands 05/03/2016   Past Surgical History:  Procedure Laterality Date  . ABDOMINAL HYSTERECTOMY    . CATARACT EXTRACTION    . endaryerectomy    . renal stenting     Family History  Problem Relation Age of Onset  . Cancer Mother   . Coronary artery disease Father   . Cerebrovascular Accident Father   . Cancer Sister        breast cancer  . Cancer Brother        stomach  . Heart disease Daughter        heart vavle bypass?  . Stroke Sister   . Lung disease Brother   . Cancer Brother        unknown  . Arthritis Daughter    Social History   Socioeconomic History  . Marital status: Widowed    Spouse name: Ihor Gully  . Number of children: 3  . Years of education: Not on file  . Highest education level: 10th grade  Occupational History  . Occupation: Retired  Tobacco Use  . Smoking status: Former Smoker    Packs/day: 1.00    Years: 50.00    Pack years: 50.00    Types: Cigarettes    Quit date: 2017    Years since quitting: 5.2  . Smokeless tobacco: Never Used  . Tobacco comment: smoking cesssation materials not required  Vaping Use  . Vaping Use: Never used  Substance and Sexual Activity  . Alcohol use: No    Alcohol/week: 0.0 standard drinks  . Drug use: No  . Sexual activity: Not Currently  Other Topics Concern  . Not on file  Social History Narrative   Pt lives alone   Social Determinants of Health   Financial Resource Strain: Low Risk   . Difficulty of Paying Living Expenses: Not hard at all  Food Insecurity: No Food Insecurity  . Worried About Charity fundraiser in the Last Year: Never true  . Ran Out of Food in the Last Year: Never true  Transportation Needs: No Transportation Needs  . Lack of Transportation (Medical): No  . Lack of Transportation (Non-Medical): No  Physical Activity: Inactive  .  Days of Exercise per Week: 0 days  . Minutes of Exercise per Session: 0 min  Stress: No Stress Concern Present  . Feeling of Stress : Only a little  Social Connections: Moderately Isolated  . Frequency of Communication with Friends and Family: More than three times a week  . Frequency of Social Gatherings with Friends and Family: More than three times a week  . Attends Religious Services: More than 4 times per year  . Active Member of Clubs or Organizations: No  . Attends Archivist Meetings: Never  . Marital Status: Widowed    Tobacco Counseling Counseling given: Not Answered Comment: smoking cesssation materials not required   Clinical Intake:  Pre-visit preparation completed: Yes  Pain : No/denies pain     Nutritional Risks: None Diabetes:  No  How often do you need to have someone help you when you read instructions, pamphlets, or other written materials from your doctor or pharmacy?: 1 - Never    Interpreter Needed?: No  Information entered by :: Clemetine Marker LPN   Activities of Daily Living In your present state of health, do you have any difficulty performing the following activities: 02/10/2021 11/30/2020  Hearing? Tempie Donning  Comment wears hearing aids -  Vision? Y Y  Difficulty concentrating or making decisions? N N  Walking or climbing stairs? N N  Comment - -  Dressing or bathing? N N  Doing errands, shopping? N [place] and eating ? N -  Using the Toilet? N -  In the past six months, have you accidently leaked urine? Y -  Do you have problems with loss of bowel control? N -  Managing your Medications? N -  Managing your Finances? N -  Housekeeping or managing your Housekeeping? N -  Some recent data might be hidden    Patient Care Team: Steele Sizer, MD as PCP - General (Family Medicine) Dionisio David, MD as Consulting Physician (Cardiology)  Indicate any recent Medical Services you may have received from other than  [facility] providers in the past year (date may be approximate).     Assessment:   This is a routine wellness examination for [place].  Hearing/Vision screen  Hearing Screening   125Hz  250Hz  500Hz  1000Hz  2000Hz  3000Hz  4000Hz  6000Hz  8000Hz   Right ear:           Left ear:           Comments: Pt wears hearing aids   Vision Screening Comments: Sees Dr. Ellin Mayhew for annual eye exams  Dietary issues and exercise activities discussed: Current Exercise Habits: The patient does not participate in regular exercise at present, Exercise limited by: orthopedic condition(s)  Goals    . DIET - INCREASE WATER INTAKE     Recommend to drink at least 6-8 8oz glasses of water per day.      Depression Screen PHQ 2/9 Scores 02/10/2021 11/30/2020 08/30/2020 06/02/2020 05/04/2020 04/23/2020 02/27/2020  PHQ - 2 Score 0 0 0 0 0 1 0  PHQ- 9 Score - - 0 0 0 - -    Fall Risk Fall Risk  02/10/2021 11/30/2020 08/30/2020 06/02/2020 02/27/2020  Falls in the past year? 0 0 0 0 0  Number falls in past yr: 0 0 0 0 -  Injury with Fall? 0 0 0 0 -  Comment - - - - -  Risk Factor Category  - - - - -  Risk for fall due to : No Fall Risks - - - -  Risk for fall due to: Comment - - - - -  Follow up Falls prevention discussed - Falls evaluation completed - -    FALL RISK PREVENTION PERTAINING TO THE HOME:  Any stairs in or around the home? No  If so, are there any without handrails? No  Home free of loose throw rugs in walkways, pet beds, electrical cords, etc? Yes  Adequate lighting in your home to reduce risk of falls? Yes   ASSISTIVE DEVICES UTILIZED TO PREVENT FALLS:  Life alert? No  Use of a cane, walker or w/c? No  Grab bars in the bathroom? No  Shower chair or bench in shower? Yes  Elevated toilet seat or a handicapped toilet? No   TIMED UP AND GO:  Was the test performed? No . Telephonic visit.   Cognitive Function:     6CIT Screen 02/10/2021 02/10/2020 03/26/2018  What Year? 0 points 0 points 0 points  What  month? 0 points 0 points 0 points  What time? 0 points 0 points 0 points  Count back from 20 0 points 0 points 0 points  Months in reverse 0 points 0 points 0 points  Repeat phrase 0 points 0 points 0 points  Total Score 0 0 0    Immunizations Immunization History  Administered Date(s) Administered  . Influenza, High Dose Seasonal PF 07/22/2015, 08/29/2016, 08/29/2017, 08/27/2018, 08/30/2019  . Influenza-Unspecified 08/06/2020  . Pneumococcal Conjugate-13 05/26/2014  . Pneumococcal Polysaccharide-23 06/06/2012  . Tdap 09/09/2012  . Zoster 06/18/2008    TDAP status: Up to date  Flu Vaccine status: Up to date  Pneumococcal vaccine status: Up to date  Covid-19 vaccine status: Declined, Education has been provided regarding the importance of this vaccine but patient still declined. Advised may receive this vaccine at local pharmacy or Health Dept.or vaccine clinic. Aware to provide a copy of the vaccination record if obtained from local pharmacy or Health Dept. Verbalized acceptance and understanding.  Qualifies for Shingles Vaccine? Yes   Zostavax completed Yes   Shingrix Completed?: No.    Education has been provided regarding the importance of this vaccine. Patient has been advised to call insurance company to determine out of pocket expense if they have not yet received this vaccine. Advised may also receive vaccine at local pharmacy or Health Dept. Verbalized acceptance and understanding.  Screening Tests Health Maintenance  Topic Date Due  . COVID-19 Vaccine (1) 02/26/2021 (Originally 10/15/1941)  . INFLUENZA VACCINE  06/06/2021  . TETANUS/TDAP  09/09/2022  . DEXA SCAN  Completed  . PNA vac Low Risk Adult  Completed  . HPV VACCINES  Aged Out  . MAMMOGRAM  Discontinued    Health Maintenance  There are no preventive care reminders to display for this patient.  Colorectal cancer screening: No longer required.   Mammogram status: No longer required due to age.  Bone  density screening: no longer required due to age  Lung Cancer Screening: (Low Dose CT Chest recommended if Age 60-80 years, 30 pack-year currently smoking OR have quit w/in 15years.) does not qualify.   Additional Screening:  Hepatitis C Screening: does not qualify.  Vision Screening: Recommended annual ophthalmology exams for early detection of glaucoma and other disorders of the eye. Is the patient up to date with their annual eye exam?  Yes  Who is the provider or what is the name of the office in which the patient attends annual eye exams? [place] Screening: Recommended annual dental exams for proper oral hygiene  Community Resource Referral / Chronic Care Management: CRR required this visit?  No   CCM required this visit?  No      Plan:     I have personally reviewed and noted the following in the patient's chart:   . Medical and social history . Use of alcohol, tobacco or illicit drugs  . Current medications and supplements . Functional ability and status . Nutritional status . Physical activity . Advanced directives . List of other physicians . Hospitalizations, surgeries, and ER visits in previous 12 months . Vitals . Screenings to include cognitive, depression, and falls . Referrals and appointments  In addition, I have reviewed and discussed with patient certain preventive protocols, quality metrics, and best practice recommendations. A  written personalized care plan for preventive services as well as general preventive health recommendations were provided to patient.     Clemetine Marker, LPN   12/09/6331   Nurse Notes: none

## 2021-03-01 NOTE — Progress Notes (Signed)
Name: Samantha Dawson   MRN: [id number]    DOB: 04/20/1929   Date:03/02/2021       Progress Note  Subjective  Chief Complaint  Follow up   HPI   Chronic pain from arthritis/CPS:a little worse since went down on dose of hydrocodone,  I hurt all over - different areas of my body - shoulder, mid back, hands, worse symptoms on her lower back .She is currently taking Hydrocodone apap  previously prescribed by Dr. Sherie DonLada at [phone] - she was taking 105 tablets per month No problems with her medicineNo problems with constipation - takes stool softener PRN.Shestill keepsher medicine locked in a safe.Contract agreement done 11/2020 , drug screen reviewed done 2022  She is now down to 60 pills of hydrocodone 5/325 mg, pain level is about 4-5/10 and stable. She was given Gabapentin to take at night but she never titrated dose up and stopped taking because it did not help much and made her feel too sleepy, she asked to go back to 10/325 mg dose since having to take Advil now to control pain, we will try increasing amount of pills to 90 pills per month , advised to titrate dose as previously discussed t. She states afraid of seeing pain clinic because of lack of transportation, we will refer her to chronic care management and see if she qualifies for free transportation   COPD: She was using Trelegybut it caused some hoarseness, she states Anoro is not as bad, and is tolerating it well. She is now on oxygen, she has been having nocturnal hypoxemia 2 liters , she also wears it intermittently during the day, She states mild intermittent cough, her symptoms is mostly sob and stable, occasionally wheezing . She quit smoking about 5 years ago. Discussed lung CT but still not interested. Pulse ox at home between 90-92 %  .Discussed COVID-19 vaccine   Hx of HTN/CKD:BP stable and towards low end of normal, no dizziness,  Dr.Khan stopped all her bp medications, she denies chest pain or palpitation or dizziness.  last GFR was 47, discussed importance of avoiding NSA'S  CAD/HLD:She is off Plavix because of nose bleeds, she is now on Crestor and aspirin 81 mg,she denies chest pain, but had a severe episode of upper back and shoulders last week, she felt SOB but not sure if diaphoresis, she sees Dr. Welton FlakesKhan and advise  GERD: Taking protonix bid and states occasionally has heartburn and indigestion, no longer taking twice daily, mostly as needed   Thyroid nodule :managed by ENT, Dr. Willeen CassBennett - she has been released , last TSH was within normal limits   Osteoporosis  : she does not want to discuss therapy, not a good candidate for alendronate due to CKI stage III, discussed referral to endo but she would like to hold off for now . She is not interested .Unchanged   Senile purpura  stable on both arms, stable   Hyperglycemia: discussed pre-diabetes, A1C up to  6 % , denies polyphagia, polydipsia or polyuria   Patient Active Problem List   Diagnosis Date Noted  . History of nonmelanoma skin cancer 06/06/2020  . Senile purpura (HCC) 05/04/2020  . Nocturnal hypoxemia due to emphysema (HCC) 05/04/2020  . Carotid atherosclerosis, bilateral 08/27/2018  . Bilateral carotid bruits 08/27/2018  . CKD (chronic kidney disease) stage 3, GFR 30-59 ml/min (HCC) 08/27/2018  . Age-related osteoporosis without current pathological fracture 08/11/2018  . Shoulder pain, left 08/29/2017  . Acid reflux 10/15/2016  . Uncomplicated  opioid use 09/08/2016  . Hyperglycemia 09/02/2016  . Left thyroid nodule 07/13/2016  . Abnormal bone scan of cervical spine 06/18/2016  . Primary osteoarthritis of both hands 05/03/2016  . Elevated serum alkaline phosphatase level 05/03/2016  . Hyperkalemia 05/03/2016  . 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 Hx of smoking 04/17/2016  . Controlled substance agreement signed 03/22/2016  . Back pain 03/22/2016  . Hyperlipidemia 10/25/2015  . Essential hypertension 10/25/2015   . Panlobular emphysema ([place]) 10/25/2015  . Chronic pain 05/25/2015  . Actinic keratosis 05/25/2015    Past Surgical History:  Procedure Laterality Date  . ABDOMINAL HYSTERECTOMY    . CATARACT EXTRACTION    . endaryerectomy    . renal stenting      Family History  Problem Relation Age of Onset  . Cancer Mother   . Coronary artery disease Father   . Cerebrovascular Accident Father   . Cancer Sister        breast cancer  . Cancer Brother        stomach  . Heart disease Daughter        heart vavle bypass?  . Stroke Sister   . Lung disease Brother   . Cancer Brother        unknown  . Arthritis Daughter     Social History   Tobacco Use  . Smoking status: Former Smoker    Packs/day: 1.00    Years: 50.00    Pack years: 50.00    Types: Cigarettes    Quit date: 2017    Years since quitting: 5.3  . Smokeless tobacco: Never Used  . Tobacco comment: smoking cesssation materials not required  Substance Use Topics  . Alcohol use: No    Alcohol/week: 0.0 standard drinks     Current Outpatient Medications:  .  albuterol (PROAIR HFA) 108 (90 Base) MCG/ACT inhaler, INHALE 2 PUFFS INTO THE LUNGS EVERY 4 HOURS AS NEEDED FOR WHEEZING OR SHORTNESS OF BREATH; FUTURE REFILLS FROM LUNG DOCTOR, Disp: 8.5 g, Rfl: 3 .  ANORO ELLIPTA 62.5-25 MCG/INH AEPB, INHALE 1 PUFF INTO THE LUNGS DAILY, Disp: 60 each, Rfl: 1 .  aspirin 81 MG tablet, Take 81 mg by mouth daily., Disp: , Rfl:  .  co-enzyme Q-10 30 MG capsule, Take 100 mg by mouth daily. , Disp: , Rfl:  .  naloxone (NARCAN) nasal spray 4 mg/0.1 mL, Prn narcotic overdose, Disp: 1 each, Rfl: 0 .  pantoprazole (PROTONIX) 20 MG tablet, Take 1 tablet (20 mg total) by mouth 2 (two) times daily. Caution:prolonged use may increase risk of pneumonia, colitis, osteoporosis, anemia.  Skip doses if you can, Disp: 180 tablet, Rfl: 1 .  HYDROcodone-acetaminophen (NORCO/VICODIN) 5-325 MG tablet, Take 1 tablet by mouth 3 (three) times daily as needed for  moderate pain. Each prescription to last one month, Disp: 90 tablet, Rfl: 0 .  HYDROcodone-acetaminophen (NORCO/VICODIN) 5-325 MG tablet, Take 1 tablet by mouth 3 (three) times daily as needed for moderate pain. To last one month, Disp: 90 tablet, Rfl: 0 .  HYDROcodone-acetaminophen (NORCO/VICODIN) 5-325 MG tablet, Take 1 tablet by mouth 3 (three) times daily as needed for moderate pain. To last one month, Disp: 90 tablet, Rfl: 0 .  rosuvastatin (CRESTOR) 10 MG tablet, Take 1 tablet (10 mg total) by mouth daily., Disp: 90 tablet, Rfl: 3  Allergies  Allergen Reactions  . Alendronate Nausea Only    I personally reviewed active problem list, medication list, allergies, family history, social history with the  patient/caregiver today.   ROS  Ten systems reviewed and is negative except as mentioned in HPI   Objective  Vitals:   03/02/21 0927  BP: 116/70  Pulse: 83  Resp: 16  Temp: 98 F (36.7 C)  TempSrc: Rectal  SpO2: 94%  Weight: 111 lb (50.3 kg)  Height: 5' (1.524 m)    Body mass index is 21.68 kg/m.  Physical Exam  Constitutional: Patient appears well-developed and well-nourished.  No distress.  HEENT: head atraumatic, normocephalic, reactive to light, neck supple Cardiovascular: Normal rate, regular rhythm and normal heart sounds.  No murmur heard. No BLE edema. Pulmonary/Chest: Effort normal and breath sounds normal. No respiratory distress. Abdominal: Soft.  There is no tenderness. Muscular skeletal: kyphosis , slow gait  Psychiatric: Patient has a normal mood and affect. behavior is normal. Judgment and thought content normal.  PHQ2/9: Depression screen [facility] 2/9 03/02/2021 02/10/2021 11/30/2020 08/30/2020 06/02/2020  Decreased Interest 0 0 0 0 0  Down, Depressed, Hopeless 0 0 0 0 0  PHQ - 2 Score 0 0 0 0 0  Altered sleeping - - - 0 0  Tired, decreased energy - - - 0 0  Change in appetite - - - 0 0  Feeling bad or failure about yourself  - - - 0 0  Trouble  concentrating - - - 0 0  Moving slowly or fidgety/restless - - - 0 0  Suicidal thoughts - - - 0 0  PHQ-9 Score - - - 0 0  Difficult doing work/chores - - - Not difficult at all -  Some recent data might be hidden    phq 9 is negative   Fall Risk: Fall Risk  03/02/2021 02/10/2021 11/30/2020 08/30/2020 06/02/2020  Falls in the past year? 0 0 0 0 0  Number falls in past yr: 0 0 0 0 0  Injury with Fall? 0 0 0 0 0  Comment - - - - -  Risk Factor Category  - - - - -  Risk for fall due to : - No Fall Risks - - -  Risk for fall due to: Comment - - - - -  Follow up - Falls prevention discussed - Falls evaluation completed -     Functional Status Survey: Is the patient deaf or have difficulty hearing?: Yes Does the patient have difficulty seeing, even when wearing glasses/contacts?: Yes Does the patient have difficulty concentrating, remembering, or making decisions?: No Does the patient have difficulty walking or climbing stairs?: No Does the patient have difficulty dressing or bathing?: No Does the patient have difficulty doing errands alone such as visiting a doctor's office or shopping?: No   Assessment & Plan  1. Chronic pain syndrome  - naloxone (NARCAN) nasal spray 4 mg/0.1 mL; Prn narcotic overdose  Dispense: 1 each; Refill: 0 - HYDROcodone-acetaminophen (NORCO/VICODIN) 5-325 MG tablet; Take 1 tablet by mouth 3 (three) times daily as needed for moderate pain. Each prescription to last one month  Dispense: 90 tablet; Refill: 0 - HYDROcodone-acetaminophen (NORCO/VICODIN) 5-325 MG tablet; Take 1 tablet by mouth 3 (three) times daily as needed for moderate pain. To last one month  Dispense: 90 tablet; Refill: 0 - HYDROcodone-acetaminophen (NORCO/VICODIN) 5-325 MG tablet; Take 1 tablet by mouth 3 (three) times daily as needed for moderate pain. To last one month  Dispense: 90 tablet; Refill: 0  2. Panlobular emphysema (HCC)   3. Stage 3b chronic kidney disease ([place])  Advised to  avoid nsaid's  4. Senile purpura (  [place])  Both arms   5. Age-related osteoporosis without current pathological fracture  Refuses therapy   6. Hyperglycemia  Discussed results with patient  7. Primary osteoarthritis of both hands   8. Gastroesophageal reflux disease without esophagitis   9. Mixed hyperlipidemia  LDL at goal  - rosuvastatin (CRESTOR) 10 MG tablet; Take 1 tablet (10 mg total) by mouth daily.  Dispense: 90 tablet; Refill: 3  10. History of nonmelanoma skin cancer  - Ambulatory referral to Dermatology

## 2021-03-02 ENCOUNTER — Other Ambulatory Visit: Payer: Self-pay

## 2021-03-02 ENCOUNTER — Ambulatory Visit (INDEPENDENT_AMBULATORY_CARE_PROVIDER_SITE_OTHER): Payer: Medicare Other | Admitting: Family Medicine

## 2021-03-02 ENCOUNTER — Encounter: Payer: Self-pay | Admitting: Family Medicine

## 2021-03-02 VITALS — BP 116/70 | HR 83 | Temp 98.0°F | Resp 16 | Ht 60.0 in | Wt 111.0 lb

## 2021-03-02 DIAGNOSIS — J431 Panlobular emphysema: Secondary | ICD-10-CM | POA: Diagnosis not present

## 2021-03-02 DIAGNOSIS — N1832 Chronic kidney disease, stage 3b: Secondary | ICD-10-CM | POA: Diagnosis not present

## 2021-03-02 DIAGNOSIS — G894 Chronic pain syndrome: Secondary | ICD-10-CM

## 2021-03-02 DIAGNOSIS — R739 Hyperglycemia, unspecified: Secondary | ICD-10-CM

## 2021-03-02 DIAGNOSIS — D692 Other nonthrombocytopenic purpura: Secondary | ICD-10-CM | POA: Diagnosis not present

## 2021-03-02 DIAGNOSIS — M810 Age-related osteoporosis without current pathological fracture: Secondary | ICD-10-CM

## 2021-03-02 DIAGNOSIS — Z85828 Personal history of other malignant neoplasm of skin: Secondary | ICD-10-CM

## 2021-03-02 DIAGNOSIS — M19041 Primary osteoarthritis, right hand: Secondary | ICD-10-CM

## 2021-03-02 DIAGNOSIS — E782 Mixed hyperlipidemia: Secondary | ICD-10-CM

## 2021-03-02 DIAGNOSIS — M19042 Primary osteoarthritis, left hand: Secondary | ICD-10-CM

## 2021-03-02 DIAGNOSIS — K219 Gastro-esophageal reflux disease without esophagitis: Secondary | ICD-10-CM

## 2021-03-02 MED ORDER — ROSUVASTATIN CALCIUM 10 MG PO TABS: 10.0000 mg | ORAL_TABLET | Freq: Every day | ORAL | 3 refills | Status: DC

## 2021-03-02 MED ORDER — NALOXONE HCL 4 MG/0.1ML NA LIQD: NASAL | 0 refills | Status: DC

## 2021-03-02 MED ORDER — HYDROCODONE-ACETAMINOPHEN 5-325 MG PO TABS: 1.0000 | ORAL_TABLET | Freq: Three times a day (TID) | ORAL | 0 refills | Status: DC | PRN

## 2021-03-02 NOTE — Patient Instructions (Signed)
Check with pharmacy about shingrix cost - vaccine

## 2021-05-31 NOTE — Progress Notes (Signed)
Name: Samantha Dawson   MRN: [id number]    DOB: 09-21-1929   Date:06/01/2021       Progress Note  Subjective  Chief Complaint  Follow Up  I connected with  [name]  on 06/01/21 at 10:20 AM EDT by telephone  verified that I am speaking with the correct person using two identifiers.  I discussed the limitations of evaluation and management by telemedicine and the availability of in person appointments. The patient expressed understanding and agreed to proceed with the virtual visit  Staff also discussed with the patient that there may be a patient responsible charge related to this service. Patient Location: at home  Provider Location: [facility]  Additional Individuals present: alone   HPI  Chronic pain from arthritis/CPS: a little worse since went down on dose of hydrocodone,  I hurt all over - different areas of my body - shoulder, mid back, hands, worse symptoms on her lower back . She is currently taking Hydrocodone apap  previously prescribed by Dr. Sanda Klein at [phone] - she was taking 105 tablets per month   No problems with her medicine  No problems with constipation - takes stool softener PRN. She still keeps her medicine locked in a safe.Contract agreement resigned  11/2020 , drug screen reviewed done 2022 and was positive for opioids and negative for other drugs.  She was  down to 60 pills of hydrocodone 5/325 mg, pain gradually got worse and she was taking more Advil and we adjusted amount to 90 per month and she states pain 3-4/10 and she can function as long as she takes medications.  She was given Gabapentin to take at night but she never titrated dose up and stopped taking because it did not help much and made her feel too sleepy. She states afraid of seeing pain clinic because of lack of transportation, we will referred her to pain clinic and tried to arrange for transportation but she states she cannot hear well and does not want to go. Explained I will not increase dose of medication,  and the goal is to try going down to at least 80 per month on her next visit. She agreed    Patient Active Problem List   Diagnosis Date Noted   History of nonmelanoma skin cancer 06/06/2020   Senile purpura ([place]) 05/04/2020   Nocturnal hypoxemia due to emphysema ([place]) 05/04/2020   Carotid atherosclerosis, bilateral 08/27/2018   Bilateral carotid bruits 08/27/2018   CKD (chronic kidney disease) stage 3, GFR 30-59 ml/min (HCC) 08/27/2018   Age-related osteoporosis without current pathological fracture 08/11/2018   Shoulder pain, left 08/29/2017   Acid reflux [id number]   Uncomplicated opioid use [id number]   Hyperglycemia 09/02/2016   Left thyroid nodule 07/13/2016   Abnormal bone scan of cervical spine 06/18/2016   Primary osteoarthritis of both hands 05/03/2016   Elevated serum alkaline phosphatase level 05/03/2016   Hyperkalemia 05/03/2016   Degenerative disc disease, thoracic 05/03/2016   Hx of smoking 04/17/2016   Controlled substance agreement signed 03/22/2016   Back pain 03/22/2016   Hyperlipidemia 10/25/2015   Essential hypertension 10/25/2015   Panlobular emphysema ([place]) 10/25/2015   Chronic pain 05/25/2015   Actinic keratosis 05/25/2015    Past Surgical History:  Procedure Laterality Date   ABDOMINAL HYSTERECTOMY     CATARACT EXTRACTION     endaryerectomy     renal stenting      Family History  Problem Relation Age of Onset   Cancer Mother  Coronary artery disease Father    Cerebrovascular Accident Father    Cancer Sister        breast cancer   Cancer Brother        stomach   Heart disease Daughter        heart vavle bypass?   Stroke Sister    Lung disease Brother    Cancer Brother        unknown   Arthritis Daughter     Social History   Socioeconomic History   Marital status: Widowed    Spouse name: Ihor Gully   Number of children: 3   Years of education: Not on file   Highest education level: 10th grade  Occupational History   Occupation:  Retired  Tobacco Use   Smoking status: Former    Packs/day: 1.00    Years: 50.00    Pack years: 50.00    Types: Cigarettes    Quit date: 2017    Years since quitting: 5.5   Smokeless tobacco: Never   Tobacco comments:    smoking Advice worker not required  Vaping Use   Vaping Use: Never used  Substance and Sexual Activity   Alcohol use: No    Alcohol/week: 0.0 standard drinks   Drug use: No   Sexual activity: Not Currently  Other Topics Concern   Not on file  Social History Narrative   Pt lives alone   Social Determinants of Health   Financial Resource Strain: Low Risk    Difficulty of Paying Living Expenses: Not hard at all  Food Insecurity: No Food Insecurity   Worried About Charity fundraiser in the Last Year: Never true   Arboriculturist in the Last Year: Never true  Transportation Needs: No Transportation Needs   Lack of Transportation (Medical): No   Lack of Transportation (Non-Medical): No  Physical Activity: Inactive   Days of Exercise per Week: 0 days   Minutes of Exercise per Session: 0 min  Stress: No Stress Concern Present   Feeling of Stress : Only a little  Social Connections: Moderately Isolated   Frequency of Communication with Friends and Family: More than three times a week   Frequency of Social Gatherings with Friends and Family: More than three times a week   Attends Religious Services: More than 4 times per year   Active Member of [employer] or Organizations: No   Attends Archivist Meetings: Never   Marital Status: Widowed  Human resources officer Violence: Not At Risk   Fear of Current or Ex-Partner: No   Emotionally Abused: No   Physically Abused: No   Sexually Abused: No     Current Outpatient Medications:    albuterol (PROAIR HFA) 108 (90 Base) MCG/ACT inhaler, INHALE 2 PUFFS INTO THE LUNGS EVERY 4 HOURS AS NEEDED FOR WHEEZING OR SHORTNESS OF BREATH; FUTURE REFILLS FROM LUNG DOCTOR, Disp: 8.5 g, Rfl: 3   ANORO ELLIPTA 62.5-25  MCG/INH AEPB, INHALE 1 PUFF INTO THE LUNGS DAILY, Disp: 60 each, Rfl: 1   aspirin 81 MG tablet, Take 81 mg by mouth daily., Disp: , Rfl:    co-enzyme Q-10 30 MG capsule, Take 100 mg by mouth daily. , Disp: , Rfl:    HYDROcodone-acetaminophen (NORCO/VICODIN) 5-325 MG tablet, Take 1 tablet by mouth 3 (three) times daily as needed for moderate pain. Each prescription to last one month, Disp: 90 tablet, Rfl: 0   naloxone (NARCAN) nasal spray 4 mg/0.1 mL, Prn narcotic overdose, Disp: 1  each, Rfl: 0   pantoprazole (PROTONIX) 20 MG tablet, Take 1 tablet (20 mg total) by mouth 2 (two) times daily. Caution:prolonged use may increase risk of pneumonia, colitis, osteoporosis, anemia.  Skip doses if you can, Disp: 180 tablet, Rfl: 1   rosuvastatin (CRESTOR) 10 MG tablet, Take 1 tablet (10 mg total) by mouth daily., Disp: 90 tablet, Rfl: 3   HYDROcodone-acetaminophen (NORCO/VICODIN) 5-325 MG tablet, Take 1 tablet by mouth 3 (three) times daily as needed for moderate pain. To last one month, Disp: 90 tablet, Rfl: 0   HYDROcodone-acetaminophen (NORCO/VICODIN) 5-325 MG tablet, Take 1 tablet by mouth 3 (three) times daily as needed for moderate pain. To last one month, Disp: 90 tablet, Rfl: 0  Allergies  Allergen Reactions   Alendronate Nausea Only    I personally reviewed active problem list, medication list, allergies with the patient/caregiver today.   ROS  Ten systems reviewed and is negative except as mentioned in HPI   Objective  Virtual encounter, vitals not obtained.  There is no height or weight on file to calculate BMI.  Physical Exam  Awake, alert and oriented   PHQ2/9: Depression screen [facility] 2/9 06/01/2021 03/02/2021 02/10/2021 11/30/2020 08/30/2020  Decreased Interest 0 0 0 0 0  Down, Depressed, Hopeless 0 0 0 0 0  PHQ - 2 Score 0 0 0 0 0  Altered sleeping - - - - 0  Tired, decreased energy - - - - 0  Change in appetite - - - - 0  Feeling bad or failure about yourself  - - - - 0   Trouble concentrating - - - - 0  Moving slowly or fidgety/restless - - - - 0  Suicidal thoughts - - - - 0  PHQ-9 Score - - - - 0  Difficult doing work/chores - - - - Not difficult at all  Some recent data might be hidden   PHQ-2/9 Result is negative.    Fall Risk: Fall Risk  06/01/2021 03/02/2021 02/10/2021 11/30/2020 08/30/2020  Falls in the past year? 0 0 0 0 0  Number falls in past yr: 0 0 0 0 0  Injury with Fall? 0 0 0 0 0  Comment - - - - -  Risk Factor Category  - - - - -  Risk for fall due to : - - No Fall Risks - -  Risk for fall due to: Comment - - - - -  Follow up Falls evaluation completed - Falls prevention discussed - Falls evaluation completed     Assessment & Plan  1. Chronic pain syndrome  - HYDROcodone-acetaminophen (NORCO/VICODIN) 5-325 MG tablet; Take 1 tablet by mouth 3 (three) times daily as needed for moderate pain. Fill September 07/31/2021  Dispense: 90 tablet; Refill: 0 - HYDROcodone-acetaminophen (NORCO/VICODIN) 5-325 MG tablet; Take 1 tablet by mouth 3 (three) times daily as needed for moderate pain. To last one month  Dispense: 90 tablet; Refill: 0 - HYDROcodone-acetaminophen (NORCO/VICODIN) 5-325 MG tablet; Take 1 tablet by mouth 3 (three) times daily as needed for moderate pain. To last one month  Dispense: 90 tablet; Refill: 0   I discussed the assessment and treatment plan with the patient. The patient was provided an opportunity to ask questions and all were answered. The patient agreed with the plan and demonstrated an understanding of the instructions.  The patient was advised to call back or seek an in-person evaluation if the symptoms worsen or if the condition fails to improve as anticipated.  I provided 15 minutes of non-face-to-face time during this encounter.

## 2021-06-01 ENCOUNTER — Other Ambulatory Visit: Payer: Self-pay

## 2021-06-01 ENCOUNTER — Encounter: Payer: Self-pay | Admitting: Family Medicine

## 2021-06-01 ENCOUNTER — Telehealth (INDEPENDENT_AMBULATORY_CARE_PROVIDER_SITE_OTHER): Payer: Medicare Other | Admitting: Family Medicine

## 2021-06-01 DIAGNOSIS — G894 Chronic pain syndrome: Secondary | ICD-10-CM

## 2021-06-01 MED ORDER — HYDROCODONE-ACETAMINOPHEN 5-325 MG PO TABS: 1.0000 | ORAL_TABLET | Freq: Three times a day (TID) | ORAL | 0 refills | Status: DC | PRN

## 2021-06-14 ENCOUNTER — Other Ambulatory Visit: Payer: Self-pay | Admitting: Family Medicine

## 2021-06-14 DIAGNOSIS — G4736 Sleep related hypoventilation in conditions classified elsewhere: Secondary | ICD-10-CM

## 2021-06-14 DIAGNOSIS — J431 Panlobular emphysema: Secondary | ICD-10-CM

## 2021-06-14 DIAGNOSIS — J439 Emphysema, unspecified: Secondary | ICD-10-CM

## 2021-06-16 DIAGNOSIS — Z85828 Personal history of other malignant neoplasm of skin: Secondary | ICD-10-CM | POA: Diagnosis not present

## 2021-06-16 DIAGNOSIS — D492 Neoplasm of unspecified behavior of bone, soft tissue, and skin: Secondary | ICD-10-CM | POA: Diagnosis not present

## 2021-06-16 DIAGNOSIS — D044 Carcinoma in situ of skin of scalp and neck: Secondary | ICD-10-CM | POA: Diagnosis not present

## 2021-06-16 DIAGNOSIS — L570 Actinic keratosis: Secondary | ICD-10-CM | POA: Diagnosis not present

## 2021-06-16 DIAGNOSIS — L578 Other skin changes due to chronic exposure to nonionizing radiation: Secondary | ICD-10-CM | POA: Diagnosis not present

## 2021-06-19 DIAGNOSIS — I272 Pulmonary hypertension, unspecified: Secondary | ICD-10-CM | POA: Diagnosis not present

## 2021-07-14 ENCOUNTER — Other Ambulatory Visit: Payer: Self-pay | Admitting: Family Medicine

## 2021-07-14 DIAGNOSIS — J431 Panlobular emphysema: Secondary | ICD-10-CM

## 2021-07-14 DIAGNOSIS — D044 Carcinoma in situ of skin of scalp and neck: Secondary | ICD-10-CM | POA: Diagnosis not present

## 2021-07-18 DIAGNOSIS — I739 Peripheral vascular disease, unspecified: Secondary | ICD-10-CM | POA: Diagnosis not present

## 2021-07-18 DIAGNOSIS — I350 Nonrheumatic aortic (valve) stenosis: Secondary | ICD-10-CM | POA: Diagnosis not present

## 2021-07-18 DIAGNOSIS — I2510 Atherosclerotic heart disease of native coronary artery without angina pectoris: Secondary | ICD-10-CM | POA: Diagnosis not present

## 2021-07-18 DIAGNOSIS — I10 Essential (primary) hypertension: Secondary | ICD-10-CM | POA: Diagnosis not present

## 2021-07-18 DIAGNOSIS — E782 Mixed hyperlipidemia: Secondary | ICD-10-CM | POA: Diagnosis not present

## 2021-07-20 DIAGNOSIS — I272 Pulmonary hypertension, unspecified: Secondary | ICD-10-CM | POA: Diagnosis not present

## 2021-07-26 DIAGNOSIS — L089 Local infection of the skin and subcutaneous tissue, unspecified: Secondary | ICD-10-CM | POA: Diagnosis not present

## 2021-08-10 ENCOUNTER — Other Ambulatory Visit: Payer: Self-pay | Admitting: Family Medicine

## 2021-08-10 DIAGNOSIS — J431 Panlobular emphysema: Secondary | ICD-10-CM

## 2021-08-10 DIAGNOSIS — G4736 Sleep related hypoventilation in conditions classified elsewhere: Secondary | ICD-10-CM

## 2021-08-10 DIAGNOSIS — J439 Emphysema, unspecified: Secondary | ICD-10-CM

## 2021-08-11 DIAGNOSIS — D492 Neoplasm of unspecified behavior of bone, soft tissue, and skin: Secondary | ICD-10-CM | POA: Diagnosis not present

## 2021-08-11 DIAGNOSIS — L578 Other skin changes due to chronic exposure to nonionizing radiation: Secondary | ICD-10-CM | POA: Diagnosis not present

## 2021-08-11 DIAGNOSIS — L814 Other melanin hyperpigmentation: Secondary | ICD-10-CM | POA: Diagnosis not present

## 2021-08-11 DIAGNOSIS — C44722 Squamous cell carcinoma of skin of right lower limb, including hip: Secondary | ICD-10-CM | POA: Diagnosis not present

## 2021-08-18 ENCOUNTER — Other Ambulatory Visit: Payer: Self-pay

## 2021-08-18 ENCOUNTER — Emergency Department: Payer: Medicare Other

## 2021-08-18 ENCOUNTER — Inpatient Hospital Stay
Admission: EM | Admit: 2021-08-18 | Discharge: 2021-08-23 | DRG: 280 | Disposition: A | Discharge status: Home Health | Payer: Medicare Other | Attending: Internal Medicine | Admitting: Internal Medicine

## 2021-08-18 DIAGNOSIS — J449 Chronic obstructive pulmonary disease, unspecified: Secondary | ICD-10-CM | POA: Diagnosis not present

## 2021-08-18 DIAGNOSIS — Z90710 Acquired absence of both cervix and uterus: Secondary | ICD-10-CM

## 2021-08-18 DIAGNOSIS — I110 Hypertensive heart disease with heart failure: Secondary | ICD-10-CM | POA: Diagnosis present

## 2021-08-18 DIAGNOSIS — Z66 Do not resuscitate: Secondary | ICD-10-CM | POA: Diagnosis not present

## 2021-08-18 DIAGNOSIS — Z7902 Long term (current) use of antithrombotics/antiplatelets: Secondary | ICD-10-CM | POA: Diagnosis not present

## 2021-08-18 DIAGNOSIS — I5043 Acute on chronic combined systolic (congestive) and diastolic (congestive) heart failure: Secondary | ICD-10-CM | POA: Diagnosis not present

## 2021-08-18 DIAGNOSIS — Z87891 Personal history of nicotine dependence: Secondary | ICD-10-CM | POA: Diagnosis not present

## 2021-08-18 DIAGNOSIS — I272 Pulmonary hypertension, unspecified: Secondary | ICD-10-CM | POA: Diagnosis not present

## 2021-08-18 DIAGNOSIS — Z803 Family history of malignant neoplasm of breast: Secondary | ICD-10-CM | POA: Diagnosis not present

## 2021-08-18 DIAGNOSIS — Z20822 Contact with and (suspected) exposure to covid-19: Secondary | ICD-10-CM | POA: Diagnosis not present

## 2021-08-18 DIAGNOSIS — Z23 Encounter for immunization: Secondary | ICD-10-CM

## 2021-08-18 DIAGNOSIS — Z8673 Personal history of transient ischemic attack (TIA), and cerebral infarction without residual deficits: Secondary | ICD-10-CM | POA: Diagnosis not present

## 2021-08-18 DIAGNOSIS — E785 Hyperlipidemia, unspecified: Secondary | ICD-10-CM | POA: Diagnosis not present

## 2021-08-18 DIAGNOSIS — Z823 Family history of stroke: Secondary | ICD-10-CM

## 2021-08-18 DIAGNOSIS — R0902 Hypoxemia: Secondary | ICD-10-CM

## 2021-08-18 DIAGNOSIS — R079 Chest pain, unspecified: Secondary | ICD-10-CM | POA: Diagnosis not present

## 2021-08-18 DIAGNOSIS — Z8249 Family history of ischemic heart disease and other diseases of the circulatory system: Secondary | ICD-10-CM | POA: Diagnosis not present

## 2021-08-18 DIAGNOSIS — Z7982 Long term (current) use of aspirin: Secondary | ICD-10-CM

## 2021-08-18 DIAGNOSIS — I959 Hypotension, unspecified: Secondary | ICD-10-CM | POA: Diagnosis not present

## 2021-08-18 DIAGNOSIS — I214 Non-ST elevation (NSTEMI) myocardial infarction: Secondary | ICD-10-CM | POA: Diagnosis not present

## 2021-08-18 DIAGNOSIS — I5021 Acute systolic (congestive) heart failure: Secondary | ICD-10-CM | POA: Diagnosis present

## 2021-08-18 DIAGNOSIS — I2510 Atherosclerotic heart disease of native coronary artery without angina pectoris: Secondary | ICD-10-CM | POA: Diagnosis not present

## 2021-08-18 DIAGNOSIS — M19041 Primary osteoarthritis, right hand: Secondary | ICD-10-CM | POA: Diagnosis present

## 2021-08-18 DIAGNOSIS — M19042 Primary osteoarthritis, left hand: Secondary | ICD-10-CM | POA: Diagnosis present

## 2021-08-18 DIAGNOSIS — J9811 Atelectasis: Secondary | ICD-10-CM | POA: Diagnosis not present

## 2021-08-18 DIAGNOSIS — Z7189 Other specified counseling: Secondary | ICD-10-CM | POA: Diagnosis not present

## 2021-08-18 DIAGNOSIS — J9621 Acute and chronic respiratory failure with hypoxia: Secondary | ICD-10-CM | POA: Diagnosis not present

## 2021-08-18 DIAGNOSIS — I161 Hypertensive emergency: Secondary | ICD-10-CM | POA: Diagnosis present

## 2021-08-18 DIAGNOSIS — H3530 Unspecified macular degeneration: Secondary | ICD-10-CM | POA: Diagnosis present

## 2021-08-18 DIAGNOSIS — M810 Age-related osteoporosis without current pathological fracture: Secondary | ICD-10-CM | POA: Diagnosis present

## 2021-08-18 DIAGNOSIS — K219 Gastro-esophageal reflux disease without esophagitis: Secondary | ICD-10-CM | POA: Diagnosis present

## 2021-08-18 DIAGNOSIS — F419 Anxiety disorder, unspecified: Secondary | ICD-10-CM | POA: Diagnosis present

## 2021-08-18 DIAGNOSIS — Z79899 Other long term (current) drug therapy: Secondary | ICD-10-CM

## 2021-08-18 DIAGNOSIS — R0602 Shortness of breath: Secondary | ICD-10-CM | POA: Diagnosis not present

## 2021-08-18 LAB — BASIC METABOLIC PANEL
Anion gap: 10 (ref 5–15)
BUN: 27 mg/dL — ABNORMAL HIGH (ref 8–23)
CO2: 25 mmol/L (ref 22–32)
Calcium: 9.4 mg/dL (ref 8.9–10.3)
Chloride: 105 mmol/L (ref 98–111)
Creatinine, Ser: 0.92 mg/dL (ref 0.44–1.00)
GFR, Estimated: 60 mL/min — ABNORMAL LOW (ref >60)
Glucose, Bld: 117 mg/dL — ABNORMAL HIGH (ref 70–99)
Potassium: 3.9 mmol/L (ref 3.5–5.1)
Sodium: 140 mmol/L (ref 135–145)

## 2021-08-18 LAB — PROTIME-INR
INR: 1.1 (ref 0.8–1.2)
Prothrombin Time: 14.4 seconds (ref 11.4–15.2)

## 2021-08-18 LAB — RESP PANEL BY RT-PCR (FLU A&B, COVID) ARPGX2
Influenza A by PCR: NEGATIVE
Influenza B by PCR: NEGATIVE
SARS Coronavirus 2 by RT PCR: NEGATIVE

## 2021-08-18 LAB — CBC
HCT: 41.9 % (ref 36.0–46.0)
Hemoglobin: 14 g/dL (ref 12.0–15.0)
MCH: 33.9 pg (ref 26.0–34.0)
MCHC: 33.4 g/dL (ref 30.0–36.0)
MCV: 101.5 fL — ABNORMAL HIGH (ref 80.0–100.0)
Platelets: 143 10*3/uL — ABNORMAL LOW (ref 150–400)
RBC: 4.13 MIL/uL (ref 3.87–5.11)
RDW: 13.5 % (ref 11.5–15.5)
WBC: 7.7 10*3/uL (ref 4.0–10.5)
nRBC: 0 % (ref 0.0–0.2)

## 2021-08-18 LAB — TROPONIN I (HIGH SENSITIVITY)
Troponin I (High Sensitivity): 144 ng/L (ref <18)
Troponin I (High Sensitivity): 168 ng/L (ref <18)

## 2021-08-18 LAB — APTT: aPTT: 28 seconds (ref 24–36)

## 2021-08-18 MED ORDER — ASPIRIN 81 MG PO CHEW
324.0000 mg | CHEWABLE_TABLET | Freq: Once | ORAL | Status: AC
  Administered 2021-08-18: 324 mg via ORAL
  Filled 2021-08-18: qty 4

## 2021-08-18 MED ORDER — METOPROLOL TARTRATE 5 MG/5ML IV SOLN: 2.5000 mg | Freq: Four times a day (QID) | INTRAVENOUS | Status: DC | PRN

## 2021-08-18 MED ORDER — PANTOPRAZOLE SODIUM 20 MG PO TBEC
20.0000 mg | DELAYED_RELEASE_TABLET | Freq: Two times a day (BID) | ORAL | Status: DC
  Administered 2021-08-18 – 2021-08-19 (×3): 20 mg via ORAL
  Filled 2021-08-18 (×4): qty 1

## 2021-08-18 MED ORDER — METOPROLOL TARTRATE 5 MG/5ML IV SOLN
INTRAVENOUS | Status: AC
  Filled 2021-08-18: qty 5

## 2021-08-18 MED ORDER — HYDROCODONE-ACETAMINOPHEN 5-325 MG PO TABS
1.0000 | ORAL_TABLET | Freq: Three times a day (TID) | ORAL | Status: DC | PRN
  Administered 2021-08-18 – 2021-08-22 (×8): 1 via ORAL
  Filled 2021-08-18 (×9): qty 1

## 2021-08-18 MED ORDER — CARVEDILOL 3.125 MG PO TABS
3.1250 mg | ORAL_TABLET | Freq: Two times a day (BID) | ORAL | Status: DC
  Administered 2021-08-19 – 2021-08-20 (×3): 3.125 mg via ORAL
  Filled 2021-08-18 (×3): qty 1

## 2021-08-18 MED ORDER — ROSUVASTATIN CALCIUM 10 MG PO TABS
10.0000 mg | ORAL_TABLET | Freq: Every day | ORAL | Status: DC
  Filled 2021-08-18: qty 1

## 2021-08-18 MED ORDER — ASPIRIN 81 MG PO CHEW: 324.0000 mg | CHEWABLE_TABLET | ORAL | Status: DC

## 2021-08-18 MED ORDER — IPRATROPIUM BROMIDE 0.02 % IN SOLN
0.5000 mg | Freq: Four times a day (QID) | RESPIRATORY_TRACT | Status: DC
  Administered 2021-08-18 – 2021-08-22 (×15): 0.5 mg via RESPIRATORY_TRACT
  Filled 2021-08-18 (×14): qty 2.5

## 2021-08-18 MED ORDER — ONDANSETRON HCL 4 MG/2ML IJ SOLN: 4.0000 mg | Freq: Four times a day (QID) | INTRAMUSCULAR | Status: DC | PRN

## 2021-08-18 MED ORDER — HEPARIN BOLUS VIA INFUSION
3000.0000 [IU] | Freq: Once | INTRAVENOUS | Status: AC
  Administered 2021-08-18: 3000 [IU] via INTRAVENOUS
  Filled 2021-08-18: qty 3000

## 2021-08-18 MED ORDER — SODIUM CHLORIDE 0.9% FLUSH
3.0000 mL | Freq: Two times a day (BID) | INTRAVENOUS | Status: DC
  Administered 2021-08-18 – 2021-08-23 (×10): 3 mL via INTRAVENOUS

## 2021-08-18 MED ORDER — ASPIRIN 300 MG RE SUPP: 300.0000 mg | RECTAL | Status: DC

## 2021-08-18 MED ORDER — FUROSEMIDE 10 MG/ML IJ SOLN
20.0000 mg | Freq: Once | INTRAMUSCULAR | Status: AC
  Administered 2021-08-18: 20 mg via INTRAVENOUS
  Filled 2021-08-18: qty 4

## 2021-08-18 MED ORDER — LABETALOL HCL 5 MG/ML IV SOLN: 10.0000 mg | INTRAVENOUS | Status: DC | PRN

## 2021-08-18 MED ORDER — TICAGRELOR 90 MG PO TABS
90.0000 mg | ORAL_TABLET | Freq: Two times a day (BID) | ORAL | Status: DC
  Administered 2021-08-18 – 2021-08-23 (×10): 90 mg via ORAL
  Filled 2021-08-18 (×10): qty 1

## 2021-08-18 MED ORDER — ATORVASTATIN CALCIUM 20 MG PO TABS
40.0000 mg | ORAL_TABLET | Freq: Every day | ORAL | Status: DC
  Administered 2021-08-19 – 2021-08-23 (×5): 40 mg via ORAL
  Filled 2021-08-18 (×5): qty 2

## 2021-08-18 MED ORDER — ACETAMINOPHEN 325 MG PO TABS: 650.0000 mg | ORAL_TABLET | ORAL | Status: DC | PRN

## 2021-08-18 MED ORDER — LISINOPRIL 10 MG PO TABS: 20.0000 mg | ORAL_TABLET | Freq: Every day | ORAL | Status: DC

## 2021-08-18 MED ORDER — LISINOPRIL 5 MG PO TABS
10.0000 mg | ORAL_TABLET | Freq: Every day | ORAL | Status: DC
  Administered 2021-08-19 – 2021-08-20 (×2): 10 mg via ORAL
  Filled 2021-08-18: qty 1
  Filled 2021-08-18: qty 2

## 2021-08-18 MED ORDER — SODIUM CHLORIDE 0.9 % IV SOLN: 250.0000 mL | INTRAVENOUS | Status: DC | PRN

## 2021-08-18 MED ORDER — ISOSORBIDE MONONITRATE ER 30 MG PO TB24
30.0000 mg | ORAL_TABLET | Freq: Every day | ORAL | Status: DC
  Administered 2021-08-18 – 2021-08-19 (×2): 30 mg via ORAL
  Filled 2021-08-18: qty 1

## 2021-08-18 MED ORDER — HEPARIN (PORCINE) 25000 UT/250ML-% IV SOLN
600.0000 [IU]/h | INTRAVENOUS | Status: DC
  Administered 2021-08-18 – 2021-08-20 (×2): 600 [IU]/h via INTRAVENOUS
  Filled 2021-08-18 (×2): qty 250

## 2021-08-18 MED ORDER — ACETAMINOPHEN 325 MG PO TABS
650.0000 mg | ORAL_TABLET | ORAL | Status: DC | PRN
  Administered 2021-08-21: 650 mg via ORAL
  Filled 2021-08-18: qty 2

## 2021-08-18 MED ORDER — UMECLIDINIUM-VILANTEROL 62.5-25 MCG/INH IN AEPB
1.0000 | INHALATION_SPRAY | Freq: Every day | RESPIRATORY_TRACT | Status: DC
  Administered 2021-08-19 – 2021-08-23 (×4): 1 via RESPIRATORY_TRACT
  Filled 2021-08-18: qty 14

## 2021-08-18 MED ORDER — ASPIRIN EC 81 MG PO TBEC
81.0000 mg | DELAYED_RELEASE_TABLET | Freq: Every day | ORAL | Status: DC
  Administered 2021-08-19 – 2021-08-23 (×5): 81 mg via ORAL
  Filled 2021-08-18 (×5): qty 1

## 2021-08-18 MED ORDER — SODIUM CHLORIDE 0.9% FLUSH
3.0000 mL | INTRAVENOUS | Status: DC | PRN
  Administered 2021-08-22: 3 mL via INTRAVENOUS

## 2021-08-18 MED ORDER — ASPIRIN 81 MG PO TABS: 81.0000 mg | ORAL_TABLET | Freq: Every day | ORAL | Status: DC

## 2021-08-18 MED ORDER — NITROGLYCERIN 0.4 MG SL SUBL
0.4000 mg | SUBLINGUAL_TABLET | SUBLINGUAL | Status: DC | PRN
  Administered 2021-08-18 – 2021-08-22 (×26): 0.4 mg via SUBLINGUAL
  Filled 2021-08-18 (×21): qty 1

## 2021-08-18 MED ORDER — METOPROLOL TARTRATE 5 MG/5ML IV SOLN
5.0000 mg | Freq: Once | INTRAVENOUS | Status: AC
Start: 2021-08-19 — End: 2021-08-18
  Administered 2021-08-18: 5 mg via INTRAVENOUS

## 2021-08-18 NOTE — ED Provider Notes (Signed)
[facility]  ____________________________________________   Event Date/Time   First MD Initiated Contact with Patient 08/18/21 1710     (approximate)  I have reviewed the triage vital signs and the nursing notes.   HISTORY  Chief Complaint Chest Pain and Shortness of Breath    HPI Samantha Dawson is a 87 y.o. female past medical history of hypertension, COPD on baseline 2 L nasal cannula who presents with chest pain.  Symptoms started a week ago.  She endorses pain in the center of her chest that sometimes radiates to the left arm.  It is brought on by walking around her house.  She usually feels better when she sits back down.  Also seems to be worse at night.  She endorses some mild increased dyspnea.  Denies nausea or diaphoresis.  When pain comes on it lasts for about 10 minutes.  Today she just felt too weak and like she could not handle it anymore.  Patient and daughter confirm that she is DNR at bedside.         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   Encounter for chronic pain management    Hx of smoking 04/17/2016   Hyperlipidemia    Hypertension    Macular degeneration    Osteoarthritis    Osteoporosis 03/22/2016   Primary osteoarthritis of both hands 05/03/2016    Patient Active Problem List   Diagnosis Date Noted   History of nonmelanoma skin cancer 06/06/2020   Senile purpura ([place]) 05/04/2020   Nocturnal hypoxemia due to emphysema ([place]) 05/04/2020   Carotid atherosclerosis, bilateral 08/27/2018   Bilateral carotid bruits 08/27/2018   CKD (chronic kidney disease) stage 3, GFR 30-59 ml/min (HCC) 08/27/2018   Age-related osteoporosis without current pathological fracture 08/11/2018    Shoulder pain, left 08/29/2017   Acid reflux 58/52/7782   Uncomplicated opioid use 42/35/3614   Hyperglycemia 09/02/2016   Left thyroid nodule 07/13/2016   Abnormal bone scan of cervical spine 06/18/2016   Primary osteoarthritis of both hands 05/03/2016   Elevated serum alkaline phosphatase level 05/03/2016   Hyperkalemia 05/03/2016   Degenerative disc disease, thoracic 05/03/2016   Hx of smoking 04/17/2016   Controlled substance agreement signed 03/22/2016   Back pain 03/22/2016   Hyperlipidemia 10/25/2015   Essential hypertension 10/25/2015   Panlobular emphysema ([place]) 10/25/2015   Chronic pain 05/25/2015   Actinic keratosis 05/25/2015    Past Surgical History:  Procedure Laterality Date   ABDOMINAL HYSTERECTOMY     CATARACT EXTRACTION     endaryerectomy     renal stenting      Prior to Admission medications   Medication Sig Start Date End Date Taking? Authorizing Provider  albuterol (PROAIR HFA) 108 (90 Base) MCG/ACT inhaler INHALE 2 PUFFS INTO THE LUNGS EVERY 4 HOURS AS NEEDED FOR WHEEZING OR SHORTNESS OF BREATH; FUTURE REFILLS FROM LUNG DOCTOR 04/26/20   Steele Sizer, MD  Jearl Klinefelter ELLIPTA 62.5-25 MCG/INH AEPB INHALE 1 PUFF INTO THE LUNGS DAILY 08/10/21   Steele Sizer, MD  aspirin 81 MG tablet Take 81 mg by mouth daily.    [provider]  co-enzyme Q-10 30 MG capsule Take 100 mg by mouth daily.     [provider]  doxycycline (  VIBRA-TABS) 100 MG tablet Take 100 mg by mouth 2 (two) times daily. 07/26/21   [provider]  HYDROcodone-acetaminophen (NORCO/VICODIN) 5-325 MG tablet Take 1 tablet by mouth 3 (three) times daily as needed for moderate pain. Fill September 07/31/2021 06/01/21   Steele Sizer, MD  HYDROcodone-acetaminophen (NORCO/VICODIN) 5-325 MG tablet Take 1 tablet by mouth 3 (three) times daily as needed for moderate pain. To last one month 06/01/21   Steele Sizer, MD  HYDROcodone-acetaminophen (NORCO/VICODIN) 5-325 MG tablet Take 1  tablet by mouth 3 (three) times daily as needed for moderate pain. To last one month 06/01/21   Steele Sizer, MD  naloxone [facility]) nasal spray 4 mg/0.1 mL Prn narcotic overdose 03/02/21   Steele Sizer, MD  pantoprazole (PROTONIX) 20 MG tablet Take 1 tablet (20 mg total) by mouth 2 (two) times daily. Caution:prolonged use may increase risk of pneumonia, colitis, osteoporosis, anemia.  Skip doses if you can 11/30/20   Steele Sizer, MD  rosuvastatin (CRESTOR) 10 MG tablet Take 1 tablet (10 mg total) by mouth daily. 03/02/21   Steele Sizer, MD    Allergies Alendronate  Family History  Problem Relation Age of Onset   Cancer Mother    Coronary artery disease Father    Cerebrovascular Accident Father    Cancer Sister        breast cancer   Cancer Brother        stomach   Heart disease Daughter        heart vavle bypass?   Stroke Sister    Lung disease Brother    Cancer Brother        unknown   Arthritis Daughter     Social History Social History   Tobacco Use   Smoking status: Former    Packs/day: 1.00    Years: 50.00    Pack years: 50.00    Types: Cigarettes    Quit date: 2017    Years since quitting: 5.7   Smokeless tobacco: Never   Tobacco comments:    smoking Advice worker not required  Vaping Use   Vaping Use: Never used  Substance Use Topics   Alcohol use: No    Alcohol/week: 0.0 standard drinks   Drug use: No    Review of Systems   Review of Systems  Constitutional:  Negative for appetite change, chills and fever.  Respiratory:  Positive for chest tightness and shortness of breath.   Cardiovascular:  Positive for chest pain. Negative for palpitations and leg swelling.  Gastrointestinal:  Negative for abdominal pain and vomiting.  All other systems reviewed and are negative.  Physical Exam Updated Vital Signs BP (!) 184/73   Pulse (!) 102   Temp 98 F (36.7 C)   SpO2 96%   Physical Exam Vitals and nursing note reviewed.   Constitutional:      General: She is not in acute distress.    Appearance: Normal appearance.  HENT:     Head: Normocephalic and atraumatic.  Eyes:     General: No scleral icterus.    Conjunctiva/sclera: Conjunctivae normal.  Cardiovascular:     Heart sounds: Normal heart sounds.  Pulmonary:     Effort: Pulmonary effort is normal. No respiratory distress.     Breath sounds: No stridor.  Musculoskeletal:        General: No deformity or signs of injury.     Cervical back: Normal range of motion.     Right lower leg: No edema.  Left lower leg: No edema.  Skin:    General: Skin is warm and dry.     Coloration: Skin is not jaundiced or pale.  Neurological:     General: No focal deficit present.     Mental Status: She is alert and oriented to person, place, and time. Mental status is at baseline.  Psychiatric:        Mood and Affect: Mood normal.        Behavior: Behavior normal.     LABS (all labs ordered are listed, but only abnormal results are displayed)  Labs Reviewed  BASIC METABOLIC PANEL - Abnormal; Notable for the following components:      Result Value   Glucose, Bld 117 (*)    BUN 27 (*)    GFR, Estimated 60 (*)    All other components within normal limits  CBC - Abnormal; Notable for the following components:   MCV 101.5 (*)    Platelets 143 (*)    All other components within normal limits  TROPONIN I (HIGH SENSITIVITY) - Abnormal; Notable for the following components:   Troponin I (High Sensitivity) 144 (*)    All other components within normal limits  RESP PANEL BY RT-PCR (FLU A&B, COVID) ARPGX2  TROPONIN I (HIGH SENSITIVITY)   ____________________________________________  EKG  LVH, mild ST depression in V5 and V6, rate rhythm, normal axis, no acute ischemic changes ____________________________________________  RADIOLOGY I, Madelin Headings, personally viewed and evaluated these images (plain radiographs) as part of my medical decision making, as  well as reviewing the written report by the radiologist.  ED MD interpretation:  I reviewed the CXR which does not show any acute cardiopulmonary process      ____________________________________________   PROCEDURES  Procedure(s) performed (including Critical Care):  Procedures   ____________________________________________   INITIAL IMPRESSION / ASSESSMENT AND PLAN / ED COURSE     Patient is a 87 year old female who presents with exertional chest pain x1 week.  Her vital signs are notable for hypertension and mild tachycardia.  Patient is elderly and frail but overall appears well.  She is independent and alert and oriented.  She wears 2 L nasal cannula at baseline.  Lungs are clear no signs of volume overload.  Her story is concerning to me given her pain is exertional.  Her EKG has LVH with questionable ST depression V5 V6 but this is likely just from repolarization abnormality.  Her troponin is notably elevated at 144.  I do feel like she is either having an NSTEMI or unstable angina.  Discussed with the patient and her daughter at bedside, patient is DNR/DNI.  They were not excited about potential angiography.  I discussed this with the hospital.  She was given aspirin.  Given she may not be going to the Cath Lab I decided to hold heparin.  Patient will be admitted to the medicine service.      ____________________________________________   FINAL CLINICAL IMPRESSION(S) / ED DIAGNOSES  Final diagnoses:  NSTEMI (non-ST elevated myocardial infarction) [facility])     ED Discharge Orders     None        Note:  This document was prepared using Dragon voice recognition software and may include unintentional dictation errors.    Rada Hay, MD 08/18/21 [phone]

## 2021-08-18 NOTE — ED Triage Notes (Signed)
Pt come with c/o SOB and CP for few days the patient states it is worse at night when she tries to lay down. Pt wears 2L [place] at home.

## 2021-08-18 NOTE — Consult Note (Signed)
ANTICOAGULATION CONSULT NOTE - Initial Consult  Pharmacy Consult for heparin dosing Indication: chest pain/ACS, NSTEMI  Allergies  Allergen Reactions   Alendronate Nausea Only    Patient Measurements:   Heparin Dosing Weight: 50.3kg  Vital Signs: Temp: 98 F (36.7 C) (10/13 1623) BP: 184/73 (10/13 1623) Pulse Rate: 102 (10/13 1623)  Labs: Recent Labs    08/18/21 1627  HGB 14.0  HCT 41.9  PLT 143*  CREATININE 0.92  TROPONINIHS 144*    CrCl cannot be calculated (Unknown ideal weight.).   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 female with medical history significant of COPD, chronic hypoxic respite failure on 2 L, HTN, HLD, CAD (I have one vessel blockage in my heart), osteoporosis, presented with new onset of chest pain and shortness of breath.  Goal of Therapy:  Heparin level 0.3-0.7 units/ml Monitor platelets by anticoagulation protocol: Yes  Baseline plts=143   Plan:  Give 3000 units bolus x 1 Heparin infusion @600  units/hr Heparin level Q8hrs after start of the infusion. Dosage adjustments made as indicated.   Berta Minor 08/18/2021,6:27 PM

## 2021-08-18 NOTE — ED Provider Notes (Addendum)
Emergency Medicine Provider Triage Evaluation Note  IYSIS GERMAIN, a 87 y.o. female  was evaluated in triage.  Pt complains of several days of chest pain and SOB. She took 2 of her Norco, normally for her arthritis, for the CP today, with relief. She denies pain currently. She notes the CP and SOB is worse at night when she tries to lay down. She is on 2L of O2 at home.   Review of Systems  Positive: CP, SOB Negative: FCS  Physical Exam  BP (!) 184/73   Pulse (!) 102   Temp 98 F (36.7 C)   SpO2 96%  Gen:   Awake, no distress  NAD Resp:  Normal effort CTA MSK:   Moves extremities without difficulty  Other:  CVS: sinus rhythm  Medical Decision Making  Medically screening exam initiated at 4:30 PM.  Appropriate orders placed.  RUDELL MARLOWE was informed that the remainder of the evaluation will be completed by another provider, this initial triage assessment does not replace that evaluation, and the importance of remaining in the ED until their evaluation is complete.  Patient with ED evaluation of CP and SOB.   Melvenia Needles, PA-C 08/18/21 1632    Melvenia Needles, PA-C 08/18/21 1633    Vanessa [place], MD 08/19/21 [phone]

## 2021-08-18 NOTE — H&P (Addendum)
History and Physical    Samantha Dawson NWG:[id number] DOB: [birth year] DOA: 08/18/2021  PCP: Steele Sizer, MD (Confirm with patient/family/NH records and if not entered, this has to be entered at [facility] point of entry) Patient coming from: Home   I have personally briefly reviewed patient's old medical records in [place]  Chief Complaint: Chest pain and shortness of breath  HPI: Samantha Dawson is a 87 y.o. female with medical history significant of COPD, chronic hypoxic respite failure on 2 L, HTN, HLD, CAD (I have one vessel blockage in my heart), osteoporosis, presented with new onset of chest pain and shortness of breath.  Her symptoms started about 1 week ago and gradually getting worse.  Exertional chest pains, at the beginning still able to walk 5 to 10 minutes but gradually, even short walk from bedroom to bathroom caused severe shortness of breath and squeezing-like chest pains.  Chest pain centrally located nonradiating, associated with shortness of breath and palpitations, improved with resting, she also took a Norco today for the chest pain with some relief.  Last night, could not sleep on a flat bed and had to sit up all night.  No leg swelling.  No cough no wheezing.  ED Course: Blood pressure significantly elevated SBP 180s, tachycardic, tachypneic, O2 saturation 95% on 2 L.  Chest x-ray no acute findings.  Troponin 144.  EKG, LVH and nonspecific ST changes on multiple leads.  Aspirin 324 mg given.  Review of Systems: As per HPI otherwise 14 point review of systems negative.    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   Encounter for chronic pain management    Hx of smoking 04/17/2016   Hyperlipidemia    Hypertension     Macular degeneration    Osteoarthritis    Osteoporosis 03/22/2016   Primary osteoarthritis of both hands 05/03/2016    Past Surgical History:  Procedure Laterality Date   ABDOMINAL HYSTERECTOMY     CATARACT EXTRACTION     endaryerectomy     renal stenting       reports that she quit smoking about 5 years ago. Her smoking use included cigarettes. She has a 50.00 pack-year smoking history. She has never used smokeless tobacco. She reports that she does not drink alcohol and does not use drugs.  Allergies  Allergen Reactions   Alendronate Nausea Only    Family History  Problem Relation Age of Onset   Cancer Mother    Coronary artery disease Father    Cerebrovascular Accident Father    Cancer Sister        breast cancer   Cancer Brother        stomach   Heart disease Daughter        heart vavle bypass?   Stroke Sister    Lung disease Brother    Cancer Brother        unknown   Arthritis Daughter      Prior to Admission medications   Medication Sig Start Date End Date Taking? Authorizing Provider  albuterol (PROAIR HFA) 108 (90 Base) MCG/ACT inhaler INHALE 2 PUFFS INTO THE LUNGS EVERY 4 HOURS AS NEEDED FOR WHEEZING OR SHORTNESS OF BREATH; FUTURE REFILLS FROM LUNG DOCTOR 04/26/20   Sowles,  Drue Stager, MD  Jearl Klinefelter ELLIPTA 62.5-25 MCG/INH AEPB INHALE 1 PUFF INTO THE LUNGS DAILY 08/10/21   Steele Sizer, MD  aspirin 81 MG tablet Take 81 mg by mouth daily.    [provider]  co-enzyme Q-10 30 MG capsule Take 100 mg by mouth daily.     [provider]  doxycycline (VIBRA-TABS) 100 MG tablet Take 100 mg by mouth 2 (two) times daily. 07/26/21   [provider]  HYDROcodone-acetaminophen (NORCO/VICODIN) 5-325 MG tablet Take 1 tablet by mouth 3 (three) times daily as needed for moderate pain. Fill September 07/31/2021 06/01/21   Steele Sizer, MD  HYDROcodone-acetaminophen (NORCO/VICODIN) 5-325 MG tablet Take 1 tablet by mouth 3 (three) times daily as needed  for moderate pain. To last one month 06/01/21   Steele Sizer, MD  HYDROcodone-acetaminophen (NORCO/VICODIN) 5-325 MG tablet Take 1 tablet by mouth 3 (three) times daily as needed for moderate pain. To last one month 06/01/21   Steele Sizer, MD  naloxone [facility]) nasal spray 4 mg/0.1 mL Prn narcotic overdose 03/02/21   Steele Sizer, MD  pantoprazole (PROTONIX) 20 MG tablet Take 1 tablet (20 mg total) by mouth 2 (two) times daily. Caution:prolonged use may increase risk of pneumonia, colitis, osteoporosis, anemia.  Skip doses if you can 11/30/20   Steele Sizer, MD  rosuvastatin (CRESTOR) 10 MG tablet Take 1 tablet (10 mg total) by mouth daily. 03/02/21   Steele Sizer, MD    Physical Exam: Vitals:   08/18/21 1623  BP: (!) 184/73  Pulse: (!) 102  Temp: 98 F (36.7 C)  SpO2: 96%    Constitutional: NAD, calm, comfortable Vitals:   08/18/21 1623  BP: (!) 184/73  Pulse: (!) 102  Temp: 98 F (36.7 C)  SpO2: 96%   Eyes: PERRL, lids and conjunctivae normal ENMT: Mucous membranes are moist. Posterior pharynx clear of any exudate or lesions.Normal dentition.  Neck: normal, supple, no masses, no thyromegaly Respiratory: clear to auscultation bilaterally, no wheezing, fine crackles on B/L bases.  Increasing respiratory effort, talking in broken sentences. No accessory muscle use.  Cardiovascular: Regular rate and rhythm, no murmurs / rubs / gallops. No extremity edema. 2+ pedal pulses. No carotid bruits.  Abdomen: no tenderness, no masses palpated. No hepatosplenomegaly. Bowel sounds positive.  Musculoskeletal: no clubbing / cyanosis. No joint deformity upper and lower extremities. Good ROM, no contractures. Normal muscle tone.  Skin: no rashes, lesions, ulcers. No induration Neurologic: CN 2-12 grossly intact. Sensation intact, DTR normal. Strength 5/5 in all 4.  Psychiatric: Normal judgment and insight. Alert and oriented x 3. Normal mood.     Labs on Admission: I have personally  reviewed following labs and imaging studies  CBC: Recent Labs  Lab 08/18/21 1627  WBC 7.7  HGB 14.0  HCT 41.9  MCV 101.5*  PLT 544*   Basic Metabolic Panel: Recent Labs  Lab 08/18/21 1627  NA 140  K 3.9  CL 105  CO2 25  GLUCOSE 117*  BUN 27*  CREATININE 0.92  CALCIUM 9.4   GFR: CrCl cannot be calculated (Unknown ideal weight.). Liver Function Tests: No results for input(s): AST, ALT, ALKPHOS, BILITOT, PROT, ALBUMIN in the last 168 hours. No results for input(s): LIPASE, AMYLASE in the last 168 hours. No results for input(s): AMMONIA in the last 168 hours. Coagulation Profile: No results for input(s): INR, PROTIME in the last 168 hours. Cardiac Enzymes: No results for input(s): CKTOTAL, CKMB, CKMBINDEX, TROPONINI in the last 168 hours. BNP (last 3 results)  No results for input(s): PROBNP in the last 8760 hours. HbA1C: No results for input(s): HGBA1C in the last 72 hours. CBG: No results for input(s): GLUCAP in the last 168 hours. Lipid Profile: No results for input(s): CHOL, HDL, LDLCALC, TRIG, CHOLHDL, LDLDIRECT in the last 72 hours. Thyroid Function Tests: No results for input(s): TSH, T4TOTAL, FREET4, T3FREE, THYROIDAB in the last 72 hours. Anemia Panel: No results for input(s): VITAMINB12, FOLATE, FERRITIN, TIBC, IRON, RETICCTPCT in the last 72 hours. Urine analysis:    Component Value Date/Time   COLORURINE YELLOW 05/29/2017 1001   APPEARANCEUR CLEAR 05/29/2017 1001   LABSPEC 1.025 05/29/2017 1001   PHURINE 5.0 05/29/2017 1001   GLUCOSEU NEGATIVE 05/29/2017 1001   HGBUR NEGATIVE 05/29/2017 1001   BILIRUBINUR NEGATIVE 05/29/2017 1001   KETONESUR NEGATIVE 05/29/2017 1001   PROTEINUR NEGATIVE 05/29/2017 1001   NITRITE NEGATIVE 05/29/2017 1001   LEUKOCYTESUR NEGATIVE 05/29/2017 1001    Radiological Exams on Admission: DG Chest 2 View  Result Date: 08/18/2021 CLINICAL DATA:  Chest pain and shortness of breath. EXAM: CHEST - 2 VIEW COMPARISON:   October 13, 2016 FINDINGS: Diffuse, chronic appearing increased interstitial lung markings are noted. Mild to moderate severity areas of atelectasis are seen within the bilateral lung bases. There is no evidence of a pleural effusion or pneumothorax. The heart size and mediastinal contours are within normal limits. Multilevel degenerative changes seen throughout the thoracic spine. IMPRESSION: COPD with mild to moderate severity bibasilar atelectasis. Electronically Signed   By: Virgina Norfolk M.D.   On: 08/18/2021 17:32    EKG: Independently reviewed. Sinus, LVH, repolarization ST-T changes on all leads.  Assessment/Plan Active Problems:   NSTEMI (non-ST elevated myocardial infarction) ([place])  (please populate well all problems here in Problem List. (For example, if patient is on BP meds at home and you resume or decide to hold them, it is a problem that needs to be her. Same for CAD, COPD, HLD and so on)  NSTEMI with Hx of CAD -vs demand ischemia -As per family request, contacted cardiologist Dr. Humphrey Rolls, who saw patient before, and family confirmed that patient is DNR and both patient and the family do not want any invasive procedures including cardiac cath.  Talked, recommend aspirin, Brilinta, ACEI and beta-blocker, and increase Lipitor to 40 mg at bedtime. -Heparin drip. -Other Ddx, Dissection less likely given the chest pain pattern and equal pulses, PE of less likely given less of triggers and no signs of DVT or peripheral swelling.  Acute diastolic CHF decompensation -Secondary to uncontrolled hypertension. -Start Coreg and ACE inhibitor, add as needed labetalol. -1 dose of 20 mg IV Lasix given, reevaluate volume status tomorrow to decide further diuresis. -Echocardiogram.  HTN emergency -Start BP medications as above, readjust according to response.  COPD -Her clinical presentations were correlated with her NSTEMI and CHF than COPD. -Change albuterol to Atrovent to prevent  tachycardia.   DVT prophylaxis: Heparin drip Code Status: DNR Family Communication: Daughter at bedside Disposition Plan: Expect more than 2 midnight hospital stay Consults called: Cardiology Dr. Humphrey Rolls. Admission status: Stepdown unit   Lequita Halt MD [facility] Pager [phone]  08/18/2021, 6:32 PM

## 2021-08-19 ENCOUNTER — Other Ambulatory Visit: Payer: Self-pay

## 2021-08-19 ENCOUNTER — Encounter: Payer: Self-pay | Admitting: Internal Medicine

## 2021-08-19 ENCOUNTER — Inpatient Hospital Stay
Admit: 2021-08-19 | Discharge: 2021-08-19 | Disposition: A | Discharge status: Home / Self Care | Payer: Medicare Other | Attending: Internal Medicine | Admitting: Internal Medicine

## 2021-08-19 DIAGNOSIS — I214 Non-ST elevation (NSTEMI) myocardial infarction: Principal | ICD-10-CM

## 2021-08-19 LAB — CBC
HCT: 36.8 % (ref 36.0–46.0)
Hemoglobin: 12.6 g/dL (ref 12.0–15.0)
MCH: 34.1 pg — ABNORMAL HIGH (ref 26.0–34.0)
MCHC: 34.2 g/dL (ref 30.0–36.0)
MCV: 99.7 fL (ref 80.0–100.0)
Platelets: 134 10*3/uL — ABNORMAL LOW (ref 150–400)
RBC: 3.69 MIL/uL — ABNORMAL LOW (ref 3.87–5.11)
RDW: 13.6 % (ref 11.5–15.5)
WBC: 8.2 10*3/uL (ref 4.0–10.5)
nRBC: 0 % (ref 0.0–0.2)

## 2021-08-19 LAB — BASIC METABOLIC PANEL
Anion gap: 7 (ref 5–15)
BUN: 27 mg/dL — ABNORMAL HIGH (ref 8–23)
CO2: 27 mmol/L (ref 22–32)
Calcium: 8.9 mg/dL (ref 8.9–10.3)
Chloride: 106 mmol/L (ref 98–111)
Creatinine, Ser: 0.78 mg/dL (ref 0.44–1.00)
GFR, Estimated: >60 mL/min (ref >60)
Glucose, Bld: 117 mg/dL — ABNORMAL HIGH (ref 70–99)
Potassium: 4.1 mmol/L (ref 3.5–5.1)
Sodium: 140 mmol/L (ref 135–145)

## 2021-08-19 LAB — HEPARIN LEVEL (UNFRACTIONATED)
Heparin Unfractionated: 0.56 IU/mL (ref 0.30–0.70)
Heparin Unfractionated: 0.37 IU/mL (ref 0.30–0.70)

## 2021-08-19 LAB — LIPID PANEL
Cholesterol: 113 mg/dL (ref 0–200)
HDL: 60 mg/dL (ref >40)
LDL Cholesterol: 46 mg/dL (ref 0–99)
Total CHOL/HDL Ratio: 1.9 RATIO
Triglycerides: 36 mg/dL (ref <150)
VLDL: 7 mg/dL (ref 0–40)

## 2021-08-19 LAB — GLUCOSE, CAPILLARY: Glucose-Capillary: 112 mg/dL — ABNORMAL HIGH (ref 70–99)

## 2021-08-19 LAB — MRSA NEXT GEN BY PCR, NASAL: MRSA by PCR Next Gen: NOT DETECTED

## 2021-08-19 MED ORDER — CHLORHEXIDINE GLUCONATE CLOTH 2 % EX PADS
6.0000 | MEDICATED_PAD | Freq: Every day | CUTANEOUS | Status: DC
  Administered 2021-08-19 – 2021-08-23 (×6): 6 via TOPICAL

## 2021-08-19 MED ORDER — INFLUENZA VAC A&B SA ADJ QUAD 0.5 ML IM PRSY
0.5000 mL | PREFILLED_SYRINGE | Freq: Once | INTRAMUSCULAR | Status: AC
  Administered 2021-08-20: 0.5 mL via INTRAMUSCULAR
  Filled 2021-08-19: qty 0.5

## 2021-08-19 MED ORDER — IPRATROPIUM-ALBUTEROL 0.5-2.5 (3) MG/3ML IN SOLN
3.0000 mL | RESPIRATORY_TRACT | Status: DC | PRN
  Administered 2021-08-21: 3 mL via RESPIRATORY_TRACT
  Filled 2021-08-19: qty 3

## 2021-08-19 NOTE — Consult Note (Signed)
ANTICOAGULATION CONSULT NOTE - Initial Consult  Pharmacy Consult for heparin dosing Indication: chest pain/ACS, NSTEMI  Allergies  Allergen Reactions   Alendronate Nausea Only    Patient Measurements: Height: 5' (152.4 cm) Weight: 48.8 kg (107 lb 9.4 oz) IBW/kg (Calculated) : 45.5 Heparin Dosing Weight: 50.3kg  Vital Signs: Temp: 97.6 F (36.4 C) (10/14 0915) Temp Source: Oral (10/14 0915) BP: 131/62 (10/14 0818) Pulse Rate: 95 (10/14 0818)  Labs: Recent Labs    08/18/21 1627 08/18/21 1827 08/18/21 1900 08/19/21 0401 08/19/21 0644  HGB 14.0  --   --   --  12.6  HCT 41.9  --   --   --  36.8  PLT 143*  --   --   --  134*  APTT  --   --  28  --   --   LABPROT  --   --  14.4  --   --   INR  --   --  1.1  --   --   HEPARINUNFRC  --   --   --  0.56  --   CREATININE 0.92  --   --   --  0.78  TROPONINIHS 144* 168*  --   --   --      Estimated Creatinine Clearance: 34.2 mL/min (by C-G formula based on SCr of 0.78 mg/dL).   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 female with medical history significant of COPD, chronic hypoxic respite failure on 2 L, HTN, HLD, CAD (I have one vessel blockage in my heart), osteoporosis, presented with new onset of chest pain and shortness of breath.  Goal of Therapy:  Heparin level 0.3-0.7 units/ml Monitor platelets by anticoagulation protocol: Yes  Baseline plts=143   Plan:  Heparin Level  therapeutic x 2: continue heparin infusion at 600 units/hr Check heparin level once daily CBC once daily while on IV heparin  Dallie Piles 08/19/2021,9:20 AM

## 2021-08-19 NOTE — Consult Note (Signed)
ANTICOAGULATION CONSULT NOTE - Initial Consult  Pharmacy Consult for heparin dosing Indication: chest pain/ACS, NSTEMI  Allergies  Allergen Reactions   Alendronate Nausea Only    Patient Measurements: Weight: 50.1 kg (110 lb 7.2 oz) Heparin Dosing Weight: 50.3kg  Vital Signs: Temp: 98.8 F (37.1 C) (10/13 2150) Temp Source: Oral (10/13 2150) BP: 157/68 (10/14 0300) Pulse Rate: 63 (10/14 0300)  Labs: Recent Labs    08/18/21 1627 08/18/21 1827 08/18/21 1900 08/19/21 0401  HGB 14.0  --   --   --   HCT 41.9  --   --   --   PLT 143*  --   --   --   APTT  --   --  28  --   LABPROT  --   --  14.4  --   INR  --   --  1.1  --   HEPARINUNFRC  --   --   --  0.56  CREATININE 0.92  --   --   --   TROPONINIHS 144* 168*  --   --      Estimated Creatinine Clearance: 29.8 mL/min (by C-G formula based on SCr of 0.92 mg/dL).   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 female with medical history significant of COPD, chronic hypoxic respite failure on 2 L, HTN, HLD, CAD (I have one vessel blockage in my heart), osteoporosis, presented with new onset of chest pain and shortness of breath.  Goal of Therapy:  Heparin level 0.3-0.7 units/ml Monitor platelets by anticoagulation protocol: Yes  Baseline plts=143   Plan:   10/14:  HL @ 0401 = 0.56 Will continue pt on current rate and draw confirmation level on 10/14 @ 1200.   Fatin Bachicha  D 08/19/2021,4:44 AM

## 2021-08-19 NOTE — Progress Notes (Addendum)
Progress Note    Samantha Dawson  XFG:[id number] DOB: 10-30-1929  DOA: 08/18/2021 PCP: Steele Sizer, MD      Brief Narrative:    Medical records reviewed and are as summarized below:  Samantha Dawson is a 87 y.o. female with medical history significant for COPD, chronic hypoxic respiratory failure on 2 L/min oxygen, hypertension, hyperlipidemia, CAD, osteoporosis, squamous cell carcinoma.  She presented to the hospital because of 1 week history of chest pain and shortness of breath.  Troponins were elevated.  At 168 and 144.  She was admitted to the hospital for acute NSTEMI.  She was treated with IV heparin infusion.  Patient declined left heart catheterization.  Cardiologist recommended isosorbide mononitrate and dual antiplatelet therapy with aspirin and Brilinta.      Assessment/Plan:   Active Problems:   NSTEMI (non-ST elevated myocardial infarction) (HCC)    Body mass index is 21.01 kg/m.   CAD, acute NSTEMI: She had ST depression in inferolateral leads.  Continue Lipitor, aspirin and Brilinta.  Continue IV heparin for another day.  Monitor heparin level per protocol.  2D echo is pending.  Follow-up with cardiologist.  Acute diastolic CHF: She received IV Lasix on 08/18/2021.  Breathing is better.  2D echo is pending.  Hypertensive emergency: BP is better.  Continue antihypertensives  Acute on chronic hypoxemic respiratory failure: She was initially treated with BiPAP.  She has been weaned off of BiPAP when she is on 4 L/min oxygen via nasal cannula.  She uses 2 L/min oxygen at baseline.  COPD: Continue bronchodilators  Other comorbidities include hyperlipidemia, osteoporosis  Diet Order             Diet Heart Room service appropriate? Yes; Fluid consistency: Thin  Diet effective now                      Consultants: Cardiologist  Procedures: None    Medications:    aspirin EC  81 mg Oral Daily   atorvastatin  40 mg Oral Daily    carvedilol  3.125 mg Oral BID WC   ipratropium  0.5 mg Nebulization Q6H   isosorbide mononitrate  30 mg Oral Daily   lisinopril  10 mg Oral Daily   pantoprazole  20 mg Oral BID   sodium chloride flush  3 mL Intravenous Q12H   ticagrelor  90 mg Oral BID   umeclidinium-vilanterol  1 puff Inhalation Daily   Continuous Infusions:  sodium chloride     heparin 600 Units/hr (08/18/21 1918)     Anti-infectives (From admission, onward)    None              Family Communication/Anticipated D/C date and plan/Code Status   DVT prophylaxis:      Code Status: DNR  Family Communication: None Disposition Plan:    Status is: Inpatient  Remains inpatient appropriate because: Acute NSTEMI on IV heparin           Subjective:   Interval events noted.  She had chest pain earlier this morning.  Currently, she is chest pain-free.  Breathing is better.  Her nurse was at the bedside.  She was seen in the emergency room.  Objective:    Vitals:   08/19/21 0738 08/19/21 0800 08/19/21 0818 08/19/21 0915  BP: (!) 95/54 110/64 131/62   Pulse: 96 90 95   Resp: 18 12    Temp:    97.6 F (36.4 C)  TempSrc:  Oral  SpO2: 97% 98%    Weight:    48.8 kg  Height:    5' (1.524 m)   No data found.  No intake or output data in the 24 hours ending 08/19/21 0918 Filed Weights   08/18/21 1900 08/19/21 0915  Weight: 50.1 kg 48.8 kg    Exam:  GEN: NAD SKIN: Warm and dry EYES: No pallor or icterus ENT: MMM CV: RRR PULM: Bibasilar rales. No wheezing ABD: soft, ND, NT, +BS CNS: AAO x 3, non focal EXT: No edema or tenderness        Data Reviewed:   I have personally reviewed following labs and imaging studies:  Labs: Labs show the following:   Basic Metabolic Panel: Recent Labs  Lab 08/18/21 1627 08/19/21 0644  NA 140 140  K 3.9 4.1  CL 105 106  CO2 25 27  GLUCOSE 117* 117*  BUN 27* 27*  CREATININE 0.92 0.78  CALCIUM 9.4 8.9   GFR Estimated  Creatinine Clearance: 34.2 mL/min (by C-G formula based on SCr of 0.78 mg/dL). Liver Function Tests: No results for input(s): AST, ALT, ALKPHOS, BILITOT, PROT, ALBUMIN in the last 168 hours. No results for input(s): LIPASE, AMYLASE in the last 168 hours. No results for input(s): AMMONIA in the last 168 hours. Coagulation profile Recent Labs  Lab 08/18/21 1900  INR 1.1    CBC: Recent Labs  Lab 08/18/21 1627 08/19/21 0644  WBC 7.7 8.2  HGB 14.0 12.6  HCT 41.9 36.8  MCV 101.5* 99.7  PLT 143* 134*   Cardiac Enzymes: No results for input(s): CKTOTAL, CKMB, CKMBINDEX, TROPONINI in the last 168 hours. BNP (last 3 results) No results for input(s): PROBNP in the last 8760 hours. CBG: No results for input(s): GLUCAP in the last 168 hours. D-Dimer: No results for input(s): DDIMER in the last 72 hours. Hgb A1c: No results for input(s): HGBA1C in the last 72 hours. Lipid Profile: Recent Labs    08/19/21 0644  CHOL 113  HDL 60  LDLCALC 46  TRIG 36  CHOLHDL 1.9   Thyroid function studies: No results for input(s): TSH, T4TOTAL, T3FREE, THYROIDAB in the last 72 hours.  Invalid input(s): FREET3 Anemia work up: No results for input(s): VITAMINB12, FOLATE, FERRITIN, TIBC, IRON, RETICCTPCT in the last 72 hours. Sepsis Labs: Recent Labs  Lab 08/18/21 1627 08/19/21 0644  WBC 7.7 8.2    Microbiology Recent Results (from the past 240 hour(s))  Resp Panel by RT-PCR (Flu A&B, Covid) Nasopharyngeal Swab     Status: None   Collection Time: 08/18/21  5:44 PM   Specimen: Nasopharyngeal Swab; Nasopharyngeal(NP) swabs in vial transport medium  Result Value Ref Range Status   SARS Coronavirus 2 by RT PCR NEGATIVE NEGATIVE Final    Comment: (NOTE) SARS-CoV-2 target nucleic acids are NOT DETECTED.  The SARS-CoV-2 RNA is generally detectable in upper respiratory specimens during the acute phase of infection. The lowest concentration of SARS-CoV-2 viral copies this assay can detect  is 138 copies/mL. A negative result does not preclude SARS-Cov-2 infection and should not be used as the sole basis for treatment or other patient management decisions. A negative result may occur with  improper specimen collection/handling, submission of specimen other than nasopharyngeal swab, presence of viral mutation(s) within the areas targeted by this assay, and inadequate number of viral copies(<138 copies/mL). A negative result must be combined with clinical observations, patient history, and epidemiological information. The expected result is Negative.  Fact Sheet for Patients:  EntrepreneurPulse.com.au  Fact Sheet for Healthcare Providers:  IncredibleEmployment.be  This test is no t yet approved or cleared by the [place] FDA and  has been authorized for detection and/or diagnosis of SARS-CoV-2 by FDA under an Emergency Use Authorization (EUA). This EUA will remain  in effect (meaning this test can be used) for the duration of the COVID-19 declaration under Section 564(b)(1) of the Act, 21 U.S.C.section 360bbb-3(b)(1), unless the authorization is terminated  or revoked sooner.       Influenza A by PCR NEGATIVE NEGATIVE Final   Influenza B by PCR NEGATIVE NEGATIVE Final    Comment: (NOTE) The Xpert Xpress SARS-CoV-2/FLU/RSV plus assay is intended as an aid in the diagnosis of influenza from Nasopharyngeal swab specimens and should not be used as a sole basis for treatment. Nasal washings and aspirates are unacceptable for Xpert Xpress SARS-CoV-2/FLU/RSV testing.  Fact Sheet for Patients: EntrepreneurPulse.com.au  Fact Sheet for Healthcare Providers: IncredibleEmployment.be  This test is not yet approved or cleared by the [place] FDA and has been authorized for detection and/or diagnosis of SARS-CoV-2 by FDA under an Emergency Use Authorization (EUA). This EUA will remain in effect  (meaning this test can be used) for the duration of the COVID-19 declaration under Section 564(b)(1) of the Act, 21 U.S.C. section 360bbb-3(b)(1), unless the authorization is terminated or revoked.  Performed at [facility], [address] [place], [place] [address]     Procedures and diagnostic studies:  DG Chest 2 View  Result Date: 08/18/2021 CLINICAL DATA:  Chest pain and shortness of breath. EXAM: CHEST - 2 VIEW COMPARISON:  October 13, 2016 FINDINGS: Diffuse, chronic appearing increased interstitial lung markings are noted. Mild to moderate severity areas of atelectasis are seen within the bilateral lung bases. There is no evidence of a pleural effusion or pneumothorax. The heart size and mediastinal contours are within normal limits. Multilevel degenerative changes seen throughout the thoracic spine. IMPRESSION: COPD with mild to moderate severity bibasilar atelectasis. Electronically Signed   By: Virgina Norfolk M.D.   On: 08/18/2021 17:32               LOS: 1 day   Maleta Pacha  [facility]   Pager on [email or web address]. If 7PM-7AM, please contact night-coverage at [email or web address]     08/19/2021, 9:18 AM

## 2021-08-19 NOTE — ED Notes (Signed)
Pt tolerating BiPAP well. Pt reports decrease in chest pain since Nitro and being placed back on BiPAP.

## 2021-08-19 NOTE — ED Notes (Signed)
Pt calling out c/o chest pain and shortness of breath, this RN to bedside, pt stating that her chest is hurting and asking for nitro tablet. [place], Primary RN to bedside.   Repeat EKG obtain, pt given Nitro tablet and placed back on BiPap.

## 2021-08-19 NOTE — Consult Note (Signed)
Samantha Dawson is a 87 y.o. female  [id number]  Primary Cardiologist: Neoma Laming, MD Reason for Consultation: chest pain  HPI: Samantha Dawson is a 87 y.o. female with medical history significant of COPD, chronic hypoxic respite failure on 2 L, HTN, HLD, CAD (I have one vessel blockage in my heart), osteoporosis, presented with new onset of chest pain and shortness of breath.   Her symptoms started about 1 week ago and gradually getting worse.  Exertional chest pains, at the beginning still able to walk 5 to 10 minutes but gradually, even short walk from bedroom to bathroom caused severe shortness of breath and squeezing-like chest pains.  Chest pain centrally located nonradiating, associated with shortness of breath and palpitations, improved with resting, she also took a Norco today for the chest pain with some relief.  Last night, could not sleep on a flat bed and had to sit up all night.  No leg swelling.  No cough no wheezing.   Review of Systems: Patient denies current chest pain, shortness of breath.   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   Encounter for chronic pain management    Hx of smoking 04/17/2016   Hyperlipidemia    Hypertension    Macular degeneration    Osteoarthritis    Osteoporosis 03/22/2016   Primary osteoarthritis of both hands 05/03/2016    (Not in a hospital admission)     aspirin EC  81 mg Oral Daily   atorvastatin  40 mg Oral Daily   carvedilol  3.125 mg Oral BID WC   ipratropium  0.5 mg Nebulization Q6H   isosorbide mononitrate  30 mg Oral Daily   lisinopril  10 mg Oral Daily   pantoprazole  20 mg Oral BID   sodium chloride flush  3 mL Intravenous Q12H   ticagrelor  90 mg Oral BID    umeclidinium-vilanterol  1 puff Inhalation Daily    Infusions:  sodium chloride     heparin 600 Units/hr (08/18/21 1918)    Allergies  Allergen Reactions   Alendronate Nausea Only    Social History   Socioeconomic History   Marital status: Widowed    Spouse name: Ihor Gully   Number of children: 3   Years of education: Not on file   Highest education level: 10th grade  Occupational History   Occupation: Retired  Tobacco Use   Smoking status: Former    Packs/day: 1.00    Years: 50.00    Pack years: 50.00    Types: Cigarettes    Quit date: 2017    Years since quitting: 5.7   Smokeless tobacco: Never   Tobacco comments:    smoking Advice worker not required  Vaping Use   Vaping Use: Never used  Substance and Sexual Activity   Alcohol use: No    Alcohol/week: 0.0 standard drinks   Drug use: No   Sexual activity: Not Currently  Other Topics Concern   Not on file  Social History Narrative   Pt lives alone   Social Determinants of Health   Financial Resource Strain: Low Risk    Difficulty of Paying Living Expenses: Not hard at all  Food Insecurity: No Food Insecurity   Worried About Running Out  of Food in the Last Year: Never true   Samantha Dawson in the Last Year: Never true  Transportation Needs: No Transportation Needs   Lack of Transportation (Medical): No   Lack of Transportation (Non-Medical): No  Physical Activity: Inactive   Days of Exercise per Week: 0 days   Minutes of Exercise per Session: 0 min  Stress: No Stress Concern Present   Feeling of Stress : Only a little  Social Connections: Moderately Isolated   Frequency of Communication with Friends and Family: More than three times a week   Frequency of Social Gatherings with Friends and Family: More than three times a week   Attends Religious Services: More than 4 times per year   Active Member of [employer] or Organizations: No   Attends Archivist Meetings: Never   Marital Status: Widowed   Human resources officer Violence: Not At Risk   Fear of Current or Ex-Partner: No   Emotionally Abused: No   Physically Abused: No   Sexually Abused: No    Family History  Problem Relation Age of Onset   Cancer Mother    Coronary artery disease Father    Cerebrovascular Accident Father    Cancer Sister        breast cancer   Cancer Brother        stomach   Heart disease Daughter        heart vavle bypass?   Stroke Sister    Lung disease Brother    Cancer Brother        unknown   Arthritis Daughter     PHYSICAL EXAM: Vitals:   08/19/21 0800 08/19/21 0818  BP: 110/64 131/62  Pulse: 90 95  Resp: 12   Temp:    SpO2: 98%     No intake or output data in the 24 hours ending 08/19/21 0836  General:  Well appearing. No respiratory difficulty HEENT: normal Neck: supple. no JVD. Carotids 2+ bilat; no bruits. No lymphadenopathy or thryomegaly appreciated. Cor: PMI nondisplaced. Regular rate & rhythm. No rubs, gallops or murmurs. Lungs: clear Abdomen: soft, nontender, nondistended. No hepatosplenomegaly. No bruits or masses. Good bowel sounds. Extremities: no cyanosis, clubbing, rash, edema Neuro: alert & oriented x 3, cranial nerves grossly intact. moves all 4 extremities w/o difficulty. Affect pleasant.  ECG: sinus tachycardia, HR 101 bpm, PACs, abnormal R wave progression  Results for orders placed or performed during the hospital encounter of 08/18/21 (from the past 24 hour(s))  Basic metabolic panel     Status: Abnormal   Collection Time: 08/18/21  4:27 PM  Result Value Ref Range   Sodium 140 135 - 145 mmol/L   Potassium 3.9 3.5 - 5.1 mmol/L   Chloride 105 98 - 111 mmol/L   CO2 25 22 - 32 mmol/L   Glucose, Bld 117 (H) 70 - 99 mg/dL   BUN 27 (H) 8 - 23 mg/dL   Creatinine, Ser 0.92 0.44 - 1.00 mg/dL   Calcium 9.4 8.9 - 10.3 mg/dL   GFR, Estimated 60 (L) >60 mL/min   Anion gap 10 5 - 15  CBC     Status: Abnormal   Collection Time: 08/18/21  4:27 PM  Result Value Ref  Range   WBC 7.7 4.0 - 10.5 K/uL   RBC 4.13 3.87 - 5.11 MIL/uL   Hemoglobin 14.0 12.0 - 15.0 g/dL   HCT 41.9 36.0 - 46.0 %   MCV 101.5 (H) 80.0 - 100.0 fL   MCH 33.9  26.0 - 34.0 pg   MCHC 33.4 30.0 - 36.0 g/dL   RDW 13.5 11.5 - 15.5 %   Platelets 143 (L) 150 - 400 K/uL   nRBC 0.0 0.0 - 0.2 %  Troponin I (High Sensitivity)     Status: Abnormal   Collection Time: 08/18/21  4:27 PM  Result Value Ref Range   Troponin I (High Sensitivity) 144 (HH) <18 ng/L  Resp Panel by RT-PCR (Flu A&B, Covid) Nasopharyngeal Swab     Status: None   Collection Time: 08/18/21  5:44 PM   Specimen: Nasopharyngeal Swab; Nasopharyngeal(NP) swabs in vial transport medium  Result Value Ref Range   SARS Coronavirus 2 by RT PCR NEGATIVE NEGATIVE   Influenza A by PCR NEGATIVE NEGATIVE   Influenza B by PCR NEGATIVE NEGATIVE  Troponin I (High Sensitivity)     Status: Abnormal   Collection Time: 08/18/21  6:27 PM  Result Value Ref Range   Troponin I (High Sensitivity) 168 (HH) <18 ng/L  APTT     Status: None   Collection Time: 08/18/21  7:00 PM  Result Value Ref Range   aPTT 28 24 - 36 seconds  Protime-INR     Status: None   Collection Time: 08/18/21  7:00 PM  Result Value Ref Range   Prothrombin Time 14.4 11.4 - 15.2 seconds   INR 1.1 0.8 - 1.2  Heparin level (unfractionated)     Status: None   Collection Time: 08/19/21  4:01 AM  Result Value Ref Range   Heparin Unfractionated 0.56 0.30 - 0.70 IU/mL  Basic metabolic panel     Status: Abnormal   Collection Time: 08/19/21  6:44 AM  Result Value Ref Range   Sodium 140 135 - 145 mmol/L   Potassium 4.1 3.5 - 5.1 mmol/L   Chloride 106 98 - 111 mmol/L   CO2 27 22 - 32 mmol/L   Glucose, Bld 117 (H) 70 - 99 mg/dL   BUN 27 (H) 8 - 23 mg/dL   Creatinine, Ser 0.78 0.44 - 1.00 mg/dL   Calcium 8.9 8.9 - 10.3 mg/dL   GFR, Estimated >60 >60 mL/min   Anion gap 7 5 - 15  Lipid panel     Status: None   Collection Time: 08/19/21  6:44 AM  Result Value Ref Range    Cholesterol 113 0 - 200 mg/dL   Triglycerides 36 <150 mg/dL   HDL 60 >40 mg/dL   Total CHOL/HDL Ratio 1.9 RATIO   VLDL 7 0 - 40 mg/dL   LDL Cholesterol 46 0 - 99 mg/dL  CBC     Status: Abnormal   Collection Time: 08/19/21  6:44 AM  Result Value Ref Range   WBC 8.2 4.0 - 10.5 K/uL   RBC 3.69 (L) 3.87 - 5.11 MIL/uL   Hemoglobin 12.6 12.0 - 15.0 g/dL   HCT 36.8 36.0 - 46.0 %   MCV 99.7 80.0 - 100.0 fL   MCH 34.1 (H) 26.0 - 34.0 pg   MCHC 34.2 30.0 - 36.0 g/dL   RDW 13.6 11.5 - 15.5 %   Platelets 134 (L) 150 - 400 K/uL   nRBC 0.0 0.0 - 0.2 %   DG Chest 2 View  Result Date: 08/18/2021 CLINICAL DATA:  Chest pain and shortness of breath. EXAM: CHEST - 2 VIEW COMPARISON:  October 13, 2016 FINDINGS: Diffuse, chronic appearing increased interstitial lung markings are noted. Mild to moderate severity areas of atelectasis are seen within the bilateral lung bases. There  is no evidence of a pleural effusion or pneumothorax. The heart size and mediastinal contours are within normal limits. Multilevel degenerative changes seen throughout the thoracic spine. IMPRESSION: COPD with mild to moderate severity bibasilar atelectasis. Electronically Signed   By: Virgina Norfolk M.D.   On: 08/18/2021 17:32     ASSESSMENT AND PLAN: Patient with worsening chest pain over the past week. Discussed possible angiogram with patient. Patient states she prefers to not have an angiogram, reports she had a CVA during previous angiogram.Patient may be discharged on isosorbide 30 mg daily, Brilinta 90 mg twice daily, and aspirin 81 mg daily. Follow up in office on Tuesday 08/23/21 at 10:00 am.   Elsworth Soho FNP-C

## 2021-08-19 NOTE — ED Notes (Signed)
Informed RN bed assigned 

## 2021-08-19 NOTE — ED Notes (Signed)
Pts HR up to 150, pt c/o worsening chest pain, pt sob. ED MD notified, Sharion Settler paged

## 2021-08-19 NOTE — Progress Notes (Signed)
*  PRELIMINARY RESULTS* Echocardiogram 2D Echocardiogram has been performed.  Samantha Dawson 08/19/2021, 11:46 AM

## 2021-08-20 ENCOUNTER — Encounter: Payer: Self-pay | Admitting: Internal Medicine

## 2021-08-20 LAB — BASIC METABOLIC PANEL
Anion gap: 6 (ref 5–15)
BUN: 23 mg/dL (ref 8–23)
CO2: 28 mmol/L (ref 22–32)
Calcium: 9 mg/dL (ref 8.9–10.3)
Chloride: 106 mmol/L (ref 98–111)
Creatinine, Ser: 0.87 mg/dL (ref 0.44–1.00)
GFR, Estimated: >60 mL/min (ref >60)
Glucose, Bld: 117 mg/dL — ABNORMAL HIGH (ref 70–99)
Potassium: 4 mmol/L (ref 3.5–5.1)
Sodium: 140 mmol/L (ref 135–145)

## 2021-08-20 LAB — CBC
HCT: 36.9 % (ref 36.0–46.0)
Hemoglobin: 12 g/dL (ref 12.0–15.0)
MCH: 32.4 pg (ref 26.0–34.0)
MCHC: 32.5 g/dL (ref 30.0–36.0)
MCV: 99.7 fL (ref 80.0–100.0)
Platelets: 143 10*3/uL — ABNORMAL LOW (ref 150–400)
RBC: 3.7 MIL/uL — ABNORMAL LOW (ref 3.87–5.11)
RDW: 13.5 % (ref 11.5–15.5)
WBC: 7.3 10*3/uL (ref 4.0–10.5)
nRBC: 0 % (ref 0.0–0.2)

## 2021-08-20 LAB — HEPARIN LEVEL (UNFRACTIONATED): Heparin Unfractionated: 0.54 IU/mL (ref 0.30–0.70)

## 2021-08-20 MED ORDER — PANTOPRAZOLE SODIUM 40 MG PO TBEC
40.0000 mg | DELAYED_RELEASE_TABLET | Freq: Two times a day (BID) | ORAL | Status: DC
  Administered 2021-08-20 – 2021-08-23 (×7): 40 mg via ORAL
  Filled 2021-08-20 (×7): qty 1

## 2021-08-20 MED ORDER — SODIUM CHLORIDE 0.9 % IV BOLUS
250.0000 mL | Freq: Once | INTRAVENOUS | Status: AC
  Administered 2021-08-20: 250 mL via INTRAVENOUS

## 2021-08-20 MED ORDER — METOPROLOL TARTRATE 50 MG PO TABS
50.0000 mg | ORAL_TABLET | Freq: Two times a day (BID) | ORAL | Status: DC
  Administered 2021-08-20: 50 mg via ORAL
  Filled 2021-08-20: qty 1

## 2021-08-20 MED ORDER — RANOLAZINE ER 500 MG PO TB12
500.0000 mg | ORAL_TABLET | Freq: Two times a day (BID) | ORAL | Status: DC
  Administered 2021-08-20 – 2021-08-23 (×7): 500 mg via ORAL
  Filled 2021-08-20 (×9): qty 1

## 2021-08-20 MED ORDER — ISOSORBIDE MONONITRATE ER 30 MG PO TB24
60.0000 mg | ORAL_TABLET | Freq: Every day | ORAL | Status: DC
  Administered 2021-08-20: 60 mg via ORAL
  Filled 2021-08-20: qty 2

## 2021-08-20 MED ORDER — ISOSORBIDE DINITRATE 30 MG PO TABS: 60.0000 mg | ORAL_TABLET | Freq: Every day | ORAL | Status: DC

## 2021-08-20 NOTE — Progress Notes (Addendum)
Progress Note    Samantha Dawson  KDX:[id number] DOB: [birth year]  DOA: 08/18/2021 PCP: Steele Sizer, MD      Brief Narrative:    Medical records reviewed and are as summarized below:  Samantha Dawson is a 87 y.o. female with medical history significant for COPD, chronic hypoxic respiratory failure on 2 L/min oxygen, hypertension, hyperlipidemia, CAD, osteoporosis, squamous cell carcinoma.  She presented to the hospital because of 1 week history of chest pain and shortness of breath.  Troponins were elevated.  At 168 and 144.  She was admitted to the hospital for acute NSTEMI.  She was treated with IV heparin infusion.  Patient declined left heart catheterization.  Cardiologist recommended isosorbide mononitrate and dual antiplatelet therapy with aspirin and Brilinta.      Assessment/Plan:   Active Problems:   NSTEMI (non-ST elevated myocardial infarction) (HCC)    Body mass index is 21.01 kg/m.   CAD, acute NSTEMI: She continues to have intermittent chest pain.  Discontinue IV heparin.  Continue aspirin, Brilinta and Lipitor.  Dr. Humphrey Rolls, cardiologist, recommended substituting metoprolol for Coreg, increasing isosorbide mononitrate from 30 mg to 60 mg daily and adding Ranexa.  However, with ongoing hypotension, these antihypertensives will be held. She refuses to have left heart cath because she stated she had a minor stroke after she underwent left heart cath some years ago. Case discussed with Dr. Humphrey Rolls, cardiologist.  He recommended conservative management.  He was recommended IV fluids for persistent hypotension.  He felt vasopressors were unnecessary.   Acute diastolic CHF: She received IV Lasix on 08/18/2021.  Breathing is better.  2D echo is pending.  Hypotension: Hold antihypertensives and monitor BP closely.  Hypertensive emergency: Resolved  Acute on chronic hypoxemic respiratory failure: She is off of BiPAP and she is tolerating 3 L/min oxygen via nasal  cannula.  She uses 2 L/min oxygen at baseline.  COPD: Continue bronchodilators  Other comorbidities include hyperlipidemia, osteoporosis  Continue to monitor on stepdown unit.  She may need vasopressors if hypotension persists.  Diet Order             Diet Heart Room service appropriate? Yes; Fluid consistency: Thin  Diet effective now                      Consultants: Cardiologist  Procedures: None    Medications:    aspirin EC  81 mg Oral Daily   atorvastatin  40 mg Oral Daily   Chlorhexidine Gluconate Cloth  6 each Topical Daily   ipratropium  0.5 mg Nebulization Q6H   isosorbide mononitrate  60 mg Oral Daily   lisinopril  10 mg Oral Daily   metoprolol tartrate  50 mg Oral BID   pantoprazole  40 mg Oral BID   ranolazine  500 mg Oral BID   sodium chloride flush  3 mL Intravenous Q12H   ticagrelor  90 mg Oral BID   umeclidinium-vilanterol  1 puff Inhalation Daily   Continuous Infusions:  sodium chloride       Anti-infectives (From admission, onward)    None              Family Communication/Anticipated D/C date and plan/Code Status   DVT prophylaxis:      Code Status: DNR  Family Communication: Plan discussed with her granddaughter at the bedside Disposition Plan: Plan to discharge home in 1 to 2 days   Status is: Inpatient  Remains inpatient appropriate because:  Acute NSTEMI, still symptomatic          Subjective:   Interval events noted.  She complained of intermittent chest pain overnight.  No shortness of breath or dizziness.  Her daughter and granddaughter were at the bedside.     Objective:    Vitals:   08/20/21 1114 08/20/21 1121 08/20/21 1200 08/20/21 1300  BP: (!) 107/93  (!) 78/56 (!) 66/50  Pulse:  99 73 66  Resp:   18 18  Temp:   97.9 F (36.6 C)   TempSrc:   Oral   SpO2:   98% 95%  Weight:      Height:       No data found.   Intake/Output Summary (Last 24 hours) at 08/20/2021 1312 Last data  filed at 08/20/2021 0800 Gross per 24 hour  Intake 131.68 ml  Output --  Net 131.68 ml   Filed Weights   08/18/21 1900 08/19/21 0915  Weight: 50.1 kg 48.8 kg    Exam:  GEN: NAD SKIN: Warm and dry EYES: No pallor or icterus ENT: MMM, hard of hearing CV: RRR PULM: Bibasilar rales.  No wheezing heard ABD: soft, ND, NT, +BS CNS: AAO x 3, non focal EXT: No edema or tenderness         Data Reviewed:   I have personally reviewed following labs and imaging studies:  Labs: Labs show the following:   Basic Metabolic Panel: Recent Labs  Lab 08/18/21 1627 08/19/21 0644 08/20/21 0535  NA 140 140 140  K 3.9 4.1 4.0  CL 105 106 106  CO2 25 27 28   GLUCOSE 117* 117* 117*  BUN 27* 27* 23  CREATININE 0.92 0.78 0.87  CALCIUM 9.4 8.9 9.0   GFR Estimated Creatinine Clearance: 31.5 mL/min (by C-G formula based on SCr of 0.87 mg/dL). Liver Function Tests: No results for input(s): AST, ALT, ALKPHOS, BILITOT, PROT, ALBUMIN in the last 168 hours. No results for input(s): LIPASE, AMYLASE in the last 168 hours. No results for input(s): AMMONIA in the last 168 hours. Coagulation profile Recent Labs  Lab 08/18/21 1900  INR 1.1    CBC: Recent Labs  Lab 08/18/21 1627 08/19/21 0644 08/20/21 0535  WBC 7.7 8.2 7.3  HGB 14.0 12.6 12.0  HCT 41.9 36.8 36.9  MCV 101.5* 99.7 99.7  PLT 143* 134* 143*   Cardiac Enzymes: No results for input(s): CKTOTAL, CKMB, CKMBINDEX, TROPONINI in the last 168 hours. BNP (last 3 results) No results for input(s): PROBNP in the last 8760 hours. CBG: Recent Labs  Lab 08/19/21 0917  GLUCAP 112*   D-Dimer: No results for input(s): DDIMER in the last 72 hours. Hgb A1c: No results for input(s): HGBA1C in the last 72 hours. Lipid Profile: Recent Labs    08/19/21 0644  CHOL 113  HDL 60  LDLCALC 46  TRIG 36  CHOLHDL 1.9   Thyroid function studies: No results for input(s): TSH, T4TOTAL, T3FREE, THYROIDAB in the last 72  hours.  Invalid input(s): FREET3 Anemia work up: No results for input(s): VITAMINB12, FOLATE, FERRITIN, TIBC, IRON, RETICCTPCT in the last 72 hours. Sepsis Labs: Recent Labs  Lab 08/18/21 1627 08/19/21 0644 08/20/21 0535  WBC 7.7 8.2 7.3    Microbiology Recent Results (from the past 240 hour(s))  Resp Panel by RT-PCR (Flu A&B, Covid) Nasopharyngeal Swab     Status: None   Collection Time: 08/18/21  5:44 PM   Specimen: Nasopharyngeal Swab; Nasopharyngeal(NP) swabs in vial transport medium  Result Value  Ref Range Status   SARS Coronavirus 2 by RT PCR NEGATIVE NEGATIVE Final    Comment: (NOTE) SARS-CoV-2 target nucleic acids are NOT DETECTED.  The SARS-CoV-2 RNA is generally detectable in upper respiratory specimens during the acute phase of infection. The lowest concentration of SARS-CoV-2 viral copies this assay can detect is 138 copies/mL. A negative result does not preclude SARS-Cov-2 infection and should not be used as the sole basis for treatment or other patient management decisions. A negative result may occur with  improper specimen collection/handling, submission of specimen other than nasopharyngeal swab, presence of viral mutation(s) within the areas targeted by this assay, and inadequate number of viral copies(<138 copies/mL). A negative result must be combined with clinical observations, patient history, and epidemiological information. The expected result is Negative.  Fact Sheet for Patients:  EntrepreneurPulse.com.au  Fact Sheet for Healthcare Providers:  IncredibleEmployment.be  This test is no t yet approved or cleared by the [place] FDA and  has been authorized for detection and/or diagnosis of SARS-CoV-2 by FDA under an Emergency Use Authorization (EUA). This EUA will remain  in effect (meaning this test can be used) for the duration of the COVID-19 declaration under Section 564(b)(1) of the Act,  21 U.S.C.section 360bbb-3(b)(1), unless the authorization is terminated  or revoked sooner.       Influenza A by PCR NEGATIVE NEGATIVE Final   Influenza B by PCR NEGATIVE NEGATIVE Final    Comment: (NOTE) The Xpert Xpress SARS-CoV-2/FLU/RSV plus assay is intended as an aid in the diagnosis of influenza from Nasopharyngeal swab specimens and should not be used as a sole basis for treatment. Nasal washings and aspirates are unacceptable for Xpert Xpress SARS-CoV-2/FLU/RSV testing.  Fact Sheet for Patients: EntrepreneurPulse.com.au  Fact Sheet for Healthcare Providers: IncredibleEmployment.be  This test is not yet approved or cleared by the [place] FDA and has been authorized for detection and/or diagnosis of SARS-CoV-2 by FDA under an Emergency Use Authorization (EUA). This EUA will remain in effect (meaning this test can be used) for the duration of the COVID-19 declaration under Section 564(b)(1) of the Act, 21 U.S.C. section 360bbb-3(b)(1), unless the authorization is terminated or revoked.  Performed at [facility], [address] [place], [place] [address]   MRSA Next Gen by PCR, Nasal     Status: None   Collection Time: 08/19/21  9:25 AM   Specimen: Nasal Mucosa; Nasal Swab  Result Value Ref Range Status   MRSA by PCR Next Gen NOT DETECTED NOT DETECTED Final    Comment: (NOTE) The GeneXpert MRSA Assay (FDA approved for NASAL specimens only), is one component of a comprehensive MRSA colonization surveillance program. It is not intended to diagnose MRSA infection nor to guide or monitor treatment for MRSA infections. Test performance is not FDA approved in patients less than 11 years old. Performed at [facility], [address] [place], [place] [address]     Procedures and diagnostic studies:  DG Chest 2 View  Result Date: 08/18/2021 CLINICAL DATA:  Chest pain and shortness of breath. EXAM: CHEST  - 2 VIEW COMPARISON:  October 13, 2016 FINDINGS: Diffuse, chronic appearing increased interstitial lung markings are noted. Mild to moderate severity areas of atelectasis are seen within the bilateral lung bases. There is no evidence of a pleural effusion or pneumothorax. The heart size and mediastinal contours are within normal limits. Multilevel degenerative changes seen throughout the thoracic spine. IMPRESSION: COPD with mild to moderate severity bibasilar atelectasis. Electronically Signed  By: Virgina Norfolk M.D.   On: 08/18/2021 17:32               LOS: 2 days   Sharne Linders  [facility]   Pager on [email or web address]. If 7PM-7AM, please contact night-coverage at [email or web address]     08/20/2021, 1:12 PM

## 2021-08-20 NOTE — Progress Notes (Addendum)
Around 0815 1 tab of 0.4 nitroglycerin was given to pt for acute CP and SOB... assessment was done around 0805 and asked if pt had any CP to which she denied.... breakfast set up for her along w/black coffee which she enjoys.   Pt had immediate CP relief upon receiving nitro.... HR reached in the high 120s and decreased to low 100s.  Stat EKG done and in Epic for review  Night RN reported similar events throughout the night and pt sts this is the fourth time she's had CP.   Will notify TH/Cards

## 2021-08-20 NOTE — Plan of Care (Signed)
Discussed with patient plan of care for the evening, pain management and medications for her heart with some teach back displayed.  Problem: Education: Goal: Knowledge of General Education information will improve Description: Including pain rating scale, medication(s)/side effects and non-pharmacologic comfort measures Outcome: Progressing   Problem: Health Behavior/Discharge Planning: Goal: Ability to manage health-related needs will improve Outcome: Progressing

## 2021-08-20 NOTE — Consult Note (Signed)
ANTICOAGULATION CONSULT NOTE - Initial Consult  Pharmacy Consult for heparin dosing Indication: chest pain/ACS, NSTEMI  Allergies  Allergen Reactions   Alendronate Nausea Only    Patient Measurements: Height: 5' (152.4 cm) Weight: 48.8 kg (107 lb 9.4 oz) IBW/kg (Calculated) : 45.5 Heparin Dosing Weight: 50.3kg  Vital Signs: BP: 105/55 (10/14 1900) Pulse Rate: 70 (10/14 1900)  Labs: Recent Labs    08/18/21 1627 08/18/21 1827 08/18/21 1900 08/19/21 0401 08/19/21 0644 08/19/21 1227 08/20/21 0535  HGB 14.0  --   --   --  12.6  --  12.0  HCT 41.9  --   --   --  36.8  --  36.9  PLT 143*  --   --   --  134*  --  143*  APTT  --   --  28  --   --   --   --   LABPROT  --   --  14.4  --   --   --   --   INR  --   --  1.1  --   --   --   --   HEPARINUNFRC  --   --   --  0.56  --  0.37 0.54  CREATININE 0.92  --   --   --  0.78  --  0.87  TROPONINIHS 144* 168*  --   --   --   --   --      Estimated Creatinine Clearance: 31.5 mL/min (by C-G formula based on SCr of 0.87 mg/dL).   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 female with medical history significant of COPD, chronic hypoxic respite failure on 2 L, HTN, HLD, CAD (I have one vessel blockage in my heart), osteoporosis, presented with new onset of chest pain and shortness of breath.  Goal of Therapy:  Heparin level 0.3-0.7 units/ml Monitor platelets by anticoagulation protocol:  Yes  Baseline plts=143   Plan:  10/15:  HL @ 0535 = 0.54, therapeutic X 3  Will continue pt on current rate and recheck HL on 10/16 with AM labs.   08/20/2021,6:33 AM

## 2021-08-20 NOTE — Progress Notes (Signed)
Pt had another episode of CP w/hR in the high 120s... nitro given w/immediate relief.   Dr. Neoma Laming has given VO via secured chat to stop coreg and add: metoprolol 50 mg BID Protonix 40 mg BID Ranexa 500 mg BID PO  Increase:  Isosorbide to 60 mg QD

## 2021-08-20 NOTE — Progress Notes (Signed)
No acute events overnight, did have an increase in chest pain, it has been occurring more often and worse according to patient. Heparin remains at 600 units, and is therapeutic.

## 2021-08-21 ENCOUNTER — Encounter: Payer: Self-pay | Admitting: Internal Medicine

## 2021-08-21 LAB — ECHOCARDIOGRAM COMPLETE
AR max vel: 1.37 cm2
AV Area VTI: 1.65 cm2
AV Area mean vel: 1.38 cm2
AV Mean grad: 12 mmHg
AV Peak grad: 21.2 mmHg
Ao pk vel: 2.3 m/s
Area-P 1/2: 3.77 cm2
Calc EF: 37.3 %
MV VTI: 1.73 cm2
S' Lateral: 2.5 cm
Single Plane A2C EF: 45.7 %
Single Plane A4C EF: 19.7 %
Weight: 1767.21 oz

## 2021-08-21 LAB — BASIC METABOLIC PANEL
Anion gap: 3 — ABNORMAL LOW (ref 5–15)
BUN: 17 mg/dL (ref 8–23)
CO2: 28 mmol/L (ref 22–32)
Calcium: 8.7 mg/dL — ABNORMAL LOW (ref 8.9–10.3)
Chloride: 107 mmol/L (ref 98–111)
Creatinine, Ser: 0.84 mg/dL (ref 0.44–1.00)
GFR, Estimated: >60 mL/min (ref >60)
Glucose, Bld: 103 mg/dL — ABNORMAL HIGH (ref 70–99)
Potassium: 3.9 mmol/L (ref 3.5–5.1)
Sodium: 138 mmol/L (ref 135–145)

## 2021-08-21 MED ORDER — ISOSORBIDE MONONITRATE ER 30 MG PO TB24
30.0000 mg | ORAL_TABLET | Freq: Every day | ORAL | Status: DC
  Administered 2021-08-21 – 2021-08-23 (×3): 30 mg via ORAL
  Filled 2021-08-21 (×3): qty 1

## 2021-08-21 MED ORDER — ALPRAZOLAM 0.25 MG PO TABS
0.2500 mg | ORAL_TABLET | Freq: Two times a day (BID) | ORAL | Status: DC | PRN
  Administered 2021-08-21 – 2021-08-22 (×4): 0.25 mg via ORAL
  Filled 2021-08-21 (×4): qty 1

## 2021-08-21 NOTE — Progress Notes (Addendum)
Progress Note    Samantha Dawson  UKG:[id number] DOB: 19-Aug-1929  DOA: 08/18/2021 PCP: Steele Sizer, MD      Brief Narrative:    Medical records reviewed and are as summarized below:  Samantha Dawson is a 87 y.o. female with medical history significant for COPD, chronic hypoxic respiratory failure on 2 L/min oxygen, hypertension, hyperlipidemia, CAD, osteoporosis, squamous cell carcinoma.  She presented to the hospital because of 1 week history of chest pain and shortness of breath.  Troponins were elevated.  At 168 and 144.  She was admitted to the hospital for acute NSTEMI.  She was treated with IV heparin infusion.  Patient declined left heart catheterization.  Cardiologist recommended isosorbide mononitrate and dual antiplatelet therapy with aspirin and Brilinta.      Assessment/Plan:   Active Problems:   NSTEMI (non-ST elevated myocardial infarction) (HCC)    Body mass index is 22.22 kg/m.   CAD, acute NSTEMI, intermittent chest pain: Continue aspirin, Ranexa, Brilinta and Lipitor.  Restart isosorbide mononitrate at 30 mg daily.  Hold metoprolol.  Added Xanax as needed for anxiety.    Hypotension: She was given 250 cc bolus of normal saline on 08/20/2021 for persistent hypotension.  BP is better today.  Acute diastolic CHF: She received IV Lasix on 08/18/2021.  Breathing is better.  2D echo report is pending.  Acute on chronic hypoxemic respiratory failure: She is on 3 L/min oxygen via nasal collar.  She normally uses 2 L/min oxygen at home.  COPD: Continue bronchodilators  Hypertensive emergency: Resolved  Other comorbidities include hyperlipidemia, osteoporosis  Patient has been encouraged to ambulate with mobility specialist/nursing staff  Diet Order             Diet Heart Room service appropriate? Yes; Fluid consistency: Thin  Diet effective now                      Consultants: Cardiologist  Procedures: None    Medications:     aspirin EC  81 mg Oral Daily   atorvastatin  40 mg Oral Daily   Chlorhexidine Gluconate Cloth  6 each Topical Daily   ipratropium  0.5 mg Nebulization Q6H   isosorbide mononitrate  30 mg Oral Daily   pantoprazole  40 mg Oral BID   ranolazine  500 mg Oral BID   sodium chloride flush  3 mL Intravenous Q12H   ticagrelor  90 mg Oral BID   umeclidinium-vilanterol  1 puff Inhalation Daily   Continuous Infusions:  sodium chloride       Anti-infectives (From admission, onward)    None              Family Communication/Anticipated D/C date and plan/Code Status   DVT prophylaxis:      Code Status: DNR  Family Communication: Plan discussed with Amy, granddaughter, at the bedside Disposition Plan: Plan to discharge home in 1 to 2 days   Status is: Inpatient  Remains inpatient appropriate because: Acute NSTEMI, still symptomatic          Subjective:   Interval events noted.  She still complains of intermittent chest pain.  No shortness of breath or dizziness.  Her granddaughter, Amy, was at the bedside  Objective:    Vitals:   08/21/21 0242 08/21/21 0307 08/21/21 0756 08/21/21 1121  BP:  97/70 104/65 (!) 136/92  Pulse:  85 88 86  Resp:  20 17 17   Temp:  97.6 F (36.4  C) 98 F (36.7 C) 98.1 F (36.7 C)  TempSrc:  Oral  Oral  SpO2:  99% 92% 96%  Weight: 51.6 kg     Height:       No data found.   Intake/Output Summary (Last 24 hours) at 08/21/2021 1220 Last data filed at 08/21/2021 0100 Gross per 24 hour  Intake 250 ml  Output 400 ml  Net -150 ml   Filed Weights   08/18/21 1900 08/19/21 0915 08/21/21 0242  Weight: 50.1 kg 48.8 kg 51.6 kg    Exam:  GEN: NAD SKIN: Warm and dry EYES: EOMI ENT: MMM, hearing impairment CV: RRR PULM: Bibasilar rales.  No wheezing ABD: soft, ND, NT, +BS CNS: AAO x 3, non focal EXT: No edema or tenderness         Data Reviewed:   I have personally reviewed following labs and imaging  studies:  Labs: Labs show the following:   Basic Metabolic Panel: Recent Labs  Lab 08/18/21 1627 08/19/21 0644 08/20/21 0535 08/21/21 0555  NA 140 140 140 138  K 3.9 4.1 4.0 3.9  CL 105 106 106 107  CO2 25 27 28 28   GLUCOSE 117* 117* 117* 103*  BUN 27* 27* 23 17  CREATININE 0.92 0.78 0.87 0.84  CALCIUM 9.4 8.9 9.0 8.7*   GFR Estimated Creatinine Clearance: 32.6 mL/min (by C-G formula based on SCr of 0.84 mg/dL). Liver Function Tests: No results for input(s): AST, ALT, ALKPHOS, BILITOT, PROT, ALBUMIN in the last 168 hours. No results for input(s): LIPASE, AMYLASE in the last 168 hours. No results for input(s): AMMONIA in the last 168 hours. Coagulation profile Recent Labs  Lab 08/18/21 1900  INR 1.1    CBC: Recent Labs  Lab 08/18/21 1627 08/19/21 0644 08/20/21 0535  WBC 7.7 8.2 7.3  HGB 14.0 12.6 12.0  HCT 41.9 36.8 36.9  MCV 101.5* 99.7 99.7  PLT 143* 134* 143*   Cardiac Enzymes: No results for input(s): CKTOTAL, CKMB, CKMBINDEX, TROPONINI in the last 168 hours. BNP (last 3 results) No results for input(s): PROBNP in the last 8760 hours. CBG: Recent Labs  Lab 08/19/21 0917  GLUCAP 112*   D-Dimer: No results for input(s): DDIMER in the last 72 hours. Hgb A1c: No results for input(s): HGBA1C in the last 72 hours. Lipid Profile: Recent Labs    08/19/21 0644  CHOL 113  HDL 60  LDLCALC 46  TRIG 36  CHOLHDL 1.9   Thyroid function studies: No results for input(s): TSH, T4TOTAL, T3FREE, THYROIDAB in the last 72 hours.  Invalid input(s): FREET3 Anemia work up: No results for input(s): VITAMINB12, FOLATE, FERRITIN, TIBC, IRON, RETICCTPCT in the last 72 hours. Sepsis Labs: Recent Labs  Lab 08/18/21 1627 08/19/21 0644 08/20/21 0535  WBC 7.7 8.2 7.3    Microbiology Recent Results (from the past 240 hour(s))  Resp Panel by RT-PCR (Flu A&B, Covid) Nasopharyngeal Swab     Status: None   Collection Time: 08/18/21  5:44 PM   Specimen:  Nasopharyngeal Swab; Nasopharyngeal(NP) swabs in vial transport medium  Result Value Ref Range Status   SARS Coronavirus 2 by RT PCR NEGATIVE NEGATIVE Final    Comment: (NOTE) SARS-CoV-2 target nucleic acids are NOT DETECTED.  The SARS-CoV-2 RNA is generally detectable in upper respiratory specimens during the acute phase of infection. The lowest concentration of SARS-CoV-2 viral copies this assay can detect is 138 copies/mL. A negative result does not preclude SARS-Cov-2 infection and should not be used as the  sole basis for treatment or other patient management decisions. A negative result may occur with  improper specimen collection/handling, submission of specimen other than nasopharyngeal swab, presence of viral mutation(s) within the areas targeted by this assay, and inadequate number of viral copies(<138 copies/mL). A negative result must be combined with clinical observations, patient history, and epidemiological information. The expected result is Negative.  Fact Sheet for Patients:  EntrepreneurPulse.com.au  Fact Sheet for Healthcare Providers:  IncredibleEmployment.be  This test is no t yet approved or cleared by the [place] FDA and  has been authorized for detection and/or diagnosis of SARS-CoV-2 by FDA under an Emergency Use Authorization (EUA). This EUA will remain  in effect (meaning this test can be used) for the duration of the COVID-19 declaration under Section 564(b)(1) of the Act, 21 U.S.C.section 360bbb-3(b)(1), unless the authorization is terminated  or revoked sooner.       Influenza A by PCR NEGATIVE NEGATIVE Final   Influenza B by PCR NEGATIVE NEGATIVE Final    Comment: (NOTE) The Xpert Xpress SARS-CoV-2/FLU/RSV plus assay is intended as an aid in the diagnosis of influenza from Nasopharyngeal swab specimens and should not be used as a sole basis for treatment. Nasal washings and aspirates are unacceptable for  Xpert Xpress SARS-CoV-2/FLU/RSV testing.  Fact Sheet for Patients: EntrepreneurPulse.com.au  Fact Sheet for Healthcare Providers: IncredibleEmployment.be  This test is not yet approved or cleared by the [place] FDA and has been authorized for detection and/or diagnosis of SARS-CoV-2 by FDA under an Emergency Use Authorization (EUA). This EUA will remain in effect (meaning this test can be used) for the duration of the COVID-19 declaration under Section 564(b)(1) of the Act, 21 U.S.C. section 360bbb-3(b)(1), unless the authorization is terminated or revoked.  Performed at [facility], [address] [place], [place] [address]   MRSA Next Gen by PCR, Nasal     Status: None   Collection Time: 08/19/21  9:25 AM   Specimen: Nasal Mucosa; Nasal Swab  Result Value Ref Range Status   MRSA by PCR Next Gen NOT DETECTED NOT DETECTED Final    Comment: (NOTE) The GeneXpert MRSA Assay (FDA approved for NASAL specimens only), is one component of a comprehensive MRSA colonization surveillance program. It is not intended to diagnose MRSA infection nor to guide or monitor treatment for MRSA infections. Test performance is not FDA approved in patients less than 20 years old. Performed at [facility], [address] [place], [place] [address]     Procedures and diagnostic studies:  No results found.             LOS: 3 days   Cordae Mccarey  [facility]   Pager on [email or web address]. If 7PM-7AM, please contact night-coverage at [email or web address]     08/21/2021, 12:20 PM

## 2021-08-21 NOTE — Plan of Care (Signed)

## 2021-08-22 ENCOUNTER — Inpatient Hospital Stay: Payer: Medicare Other

## 2021-08-22 LAB — BASIC METABOLIC PANEL
Anion gap: 8 (ref 5–15)
BUN: 17 mg/dL (ref 8–23)
CO2: 24 mmol/L (ref 22–32)
Calcium: 9 mg/dL (ref 8.9–10.3)
Chloride: 106 mmol/L (ref 98–111)
Creatinine, Ser: 0.8 mg/dL (ref 0.44–1.00)
GFR, Estimated: >60 mL/min (ref >60)
Glucose, Bld: 128 mg/dL — ABNORMAL HIGH (ref 70–99)
Potassium: 3.9 mmol/L (ref 3.5–5.1)
Sodium: 138 mmol/L (ref 135–145)

## 2021-08-22 MED ORDER — DOCUSATE SODIUM 100 MG PO CAPS
100.0000 mg | ORAL_CAPSULE | Freq: Every day | ORAL | Status: DC
  Administered 2021-08-22 – 2021-08-23 (×2): 100 mg via ORAL
  Filled 2021-08-22 (×2): qty 1

## 2021-08-22 MED ORDER — POLYETHYLENE GLYCOL 3350 17 G PO PACK
17.0000 g | PACK | Freq: Every day | ORAL | Status: DC
  Administered 2021-08-22 – 2021-08-23 (×2): 17 g via ORAL
  Filled 2021-08-22 (×2): qty 1

## 2021-08-22 MED ORDER — ENOXAPARIN SODIUM 40 MG/0.4ML IJ SOSY
40.0000 mg | PREFILLED_SYRINGE | INTRAMUSCULAR | Status: DC
  Administered 2021-08-22: 40 mg via SUBCUTANEOUS
  Filled 2021-08-22: qty 0.4

## 2021-08-22 MED ORDER — IPRATROPIUM BROMIDE 0.02 % IN SOLN
0.5000 mg | Freq: Three times a day (TID) | RESPIRATORY_TRACT | Status: DC
  Administered 2021-08-22 – 2021-08-23 (×2): 0.5 mg via RESPIRATORY_TRACT
  Filled 2021-08-22 (×2): qty 2.5

## 2021-08-22 MED ORDER — METOPROLOL TARTRATE 25 MG PO TABS
25.0000 mg | ORAL_TABLET | Freq: Two times a day (BID) | ORAL | Status: DC
  Administered 2021-08-22 – 2021-08-23 (×3): 25 mg via ORAL
  Filled 2021-08-22 (×3): qty 1

## 2021-08-22 NOTE — Progress Notes (Signed)
SUBJECTIVE: Samantha Dawson is a 87 y.o. female with medical history significant of COPD, chronic hypoxic respite failure on 2 L, HTN, HLD, CAD (I have one vessel blockage in my heart), osteoporosis, presented with new onset of chest pain and shortness of breath.   Patient sitting up in bed, no acute distress. Patient having frequent chest pains relieved only briefly with NTG. Reports worsening shortness of breath, on 4 L O2. Reports pain radiating to right shoulder, upper arm.   Vitals:   08/22/21 0513 08/22/21 0615 08/22/21 0724 08/22/21 0810  BP: (!) 137/102 (!) 147/106 (!) 148/103   Pulse:   (!) 116 (!) 109  Resp: (!) 26 13 17 16   Temp:   97.8 F (36.6 C)   TempSrc:      SpO2: 94% 96% 95% 93%  Weight:      Height:       No intake or output data in the 24 hours ending 08/22/21 0856  LABS: Basic Metabolic Panel: Recent Labs    08/21/21 0555 08/22/21 0455  NA 138 138  K 3.9 3.9  CL 107 106  CO2 28 24  GLUCOSE 103* 128*  BUN 17 17  CREATININE 0.84 0.80  CALCIUM 8.7* 9.0   Liver Function Tests: No results for input(s): AST, ALT, ALKPHOS, BILITOT, PROT, ALBUMIN in the last 72 hours. No results for input(s): LIPASE, AMYLASE in the last 72 hours. CBC: Recent Labs    08/20/21 0535  WBC 7.3  HGB 12.0  HCT 36.9  MCV 99.7  PLT 143*   Cardiac Enzymes: No results for input(s): CKTOTAL, CKMB, CKMBINDEX, TROPONINI in the last 72 hours. BNP: Invalid input(s): POCBNP D-Dimer: No results for input(s): DDIMER in the last 72 hours. Hemoglobin A1C: No results for input(s): HGBA1C in the last 72 hours. Fasting Lipid Panel: No results for input(s): CHOL, HDL, LDLCALC, TRIG, CHOLHDL, LDLDIRECT in the last 72 hours. Thyroid Function Tests: No results for input(s): TSH, T4TOTAL, T3FREE, THYROIDAB in the last 72 hours.  Invalid input(s): FREET3 Anemia Panel: No results for input(s): VITAMINB12, FOLATE, FERRITIN, TIBC, IRON, RETICCTPCT in the last 72 hours.   PHYSICAL  EXAM General: Well developed, well nourished, in no acute distress HEENT:  Normocephalic and atramatic Neck:  No JVD.  Lungs: Clear bilaterally to auscultation and percussion. Heart: HRRR . Normal S1 and S2 without gallops or murmurs.  Abdomen: Bowel sounds are positive, abdomen soft and non-tender  Msk:  Back normal, normal gait. Normal strength and tone for age. Extremities: No clubbing, cyanosis or edema.   Neuro: Alert and oriented X 3. Psych:  Good affect, responds appropriately  TELEMETRY: sinus tachycardia, HR 107 bpm  ASSESSMENT AND PLAN: Discussed cardiac catheterization with patient and granddaughter. Patient is reluctant to have cardiac catheterization due to CVA during previous cardiac catheterization. Patient will discuss procedure with family and notify staff if she decides to proceed with cardiac catheterization.  Active Problems:   NSTEMI (non-ST elevated myocardial infarction) ([place])    Samantha Borys, FNP-C 08/22/2021 8:56 AM

## 2021-08-22 NOTE — Care Management Important Message (Signed)
Important Message  Patient Details  Name: Samantha Dawson MRN: [id number] Date of Birth: [birth year]   Medicare Important Message Given:  Yes     Dannette Barbara 08/22/2021, 2:29 PM

## 2021-08-22 NOTE — Progress Notes (Addendum)
Progress Note    Samantha Dawson  PJA:[id number] DOB: [birth year]  DOA: 08/18/2021 PCP: Steele Sizer, MD      Brief Narrative:    Medical records reviewed and are as summarized below:  Samantha Dawson is a 87 y.o. female with medical history significant for COPD, chronic hypoxic respiratory failure on 2 L/min oxygen, hypertension, hyperlipidemia, CAD, osteoporosis, squamous cell carcinoma.  She presented to the hospital because of 1 week history of chest pain and shortness of breath.  Troponins were elevated.  At 168 and 144.  She was admitted to the hospital for acute NSTEMI, hypertensive emergency, acute systolic CHF and acute on chronic diastolic CHF.  She was treated with IV heparin infusion.  Patient declined left heart catheterization.  Cardiologist recommended isosorbide mononitrate, metoprolol and dual antiplatelet therapy with aspirin and Brilinta.      Assessment/Plan:   Active Problems:   NSTEMI (non-ST elevated myocardial infarction) (HCC)    Body mass index is 21.36 kg/m.   CAD, acute NSTEMI, intermittent chest pain: Continue isosorbide mononitrate, aspirin, Ranexa, Brilinta and Lipitor.  Restart metoprolol at a lower dose.  Continue Xanax as needed for anxiety  Hypotension: Overall, BP is better but is still on the low side.   Monitor BP closely.  Acute systolic CHF, acute on chronic diastolic CHF: 2D echo showed EF estimated at 35 to 34%, grade 3 diastolic dysfunction.    Acute on chronic hypoxemic respiratory failure: She is tolerating 2 L/min oxygen via nasal cannula which is her baseline oxygen requirement.  COPD: Continue bronchodilators  Hypertensive emergency: Resolved  Other comorbidities include hypertension, hyperlipidemia, osteoporosis  Consulted palliative care to discuss goals of care.  Diet Order             Diet Heart Room service appropriate? Yes; Fluid consistency: Thin  Diet effective now                       Consultants: Cardiologist  Procedures: None    Medications:    aspirin EC  81 mg Oral Daily   atorvastatin  40 mg Oral Daily   Chlorhexidine Gluconate Cloth  6 each Topical Daily   docusate sodium  100 mg Oral Daily   enoxaparin (LOVENOX) injection  40 mg Subcutaneous Q24H   ipratropium  0.5 mg Nebulization Q6H   isosorbide mononitrate  30 mg Oral Daily   metoprolol tartrate  25 mg Oral BID   pantoprazole  40 mg Oral BID   polyethylene glycol  17 g Oral Daily   ranolazine  500 mg Oral BID   sodium chloride flush  3 mL Intravenous Q12H   ticagrelor  90 mg Oral BID   umeclidinium-vilanterol  1 puff Inhalation Daily   Continuous Infusions:  sodium chloride       Anti-infectives (From admission, onward)    None              Family Communication/Anticipated D/C date and plan/Code Status   DVT prophylaxis: enoxaparin (LOVENOX) injection 40 mg Start: 08/22/21 1700     Code Status: DNR  Family Communication: Plan discussed with Amy, granddaughter, at the bedside Disposition Plan: Plan to discharge home in 1 to 2 days   Status is: Inpatient  Remains inpatient appropriate because: Acute NSTEMI, still symptomatic          Subjective:   She is still complains of intermittent chest pain and shortness of breath.  Amy, granddaughter, was at the  bedside  Objective:    Vitals:   08/22/21 0615 08/22/21 0724 08/22/21 0810 08/22/21 1528  BP: (!) 147/106 (!) 148/103  98/65  Pulse:  (!) 116 (!) 109 89  Resp: 13 17 16 18   Temp:  97.8 F (36.6 C)  98.7 F (37.1 C)  TempSrc:      SpO2: 96% 95% 93% 97%  Weight:      Height:       No data found.   Intake/Output Summary (Last 24 hours) at 08/22/2021 1627 Last data filed at 08/22/2021 1500 Gross per 24 hour  Intake 380 ml  Output --  Net 380 ml   Filed Weights   08/19/21 0915 08/21/21 0242 08/22/21 0413  Weight: 48.8 kg 51.6 kg 49.6 kg    Exam:  GEN: NAD SKIN: Warm and dry EYES:  No pallor or icterus ENT: MMM CV: RRR PULM: Bibasilar rales ABD: soft, ND, NT, +BS CNS: AAO x 3, non focal EXT: No edema or tenderness        Data Reviewed:   I have personally reviewed following labs and imaging studies:  Labs: Labs show the following:   Basic Metabolic Panel: Recent Labs  Lab 08/18/21 1627 08/19/21 0644 08/20/21 0535 08/21/21 0555 08/22/21 0455  NA 140 140 140 138 138  K 3.9 4.1 4.0 3.9 3.9  CL 105 106 106 107 106  CO2 25 27 28 28 24   GLUCOSE 117* 117* 117* 103* 128*  BUN 27* 27* 23 17 17   CREATININE 0.92 0.78 0.87 0.84 0.80  CALCIUM 9.4 8.9 9.0 8.7* 9.0   GFR Estimated Creatinine Clearance: 34.2 mL/min (by C-G formula based on SCr of 0.8 mg/dL). Liver Function Tests: No results for input(s): AST, ALT, ALKPHOS, BILITOT, PROT, ALBUMIN in the last 168 hours. No results for input(s): LIPASE, AMYLASE in the last 168 hours. No results for input(s): AMMONIA in the last 168 hours. Coagulation profile Recent Labs  Lab 08/18/21 1900  INR 1.1    CBC: Recent Labs  Lab 08/18/21 1627 08/19/21 0644 08/20/21 0535  WBC 7.7 8.2 7.3  HGB 14.0 12.6 12.0  HCT 41.9 36.8 36.9  MCV 101.5* 99.7 99.7  PLT 143* 134* 143*   Cardiac Enzymes: No results for input(s): CKTOTAL, CKMB, CKMBINDEX, TROPONINI in the last 168 hours. BNP (last 3 results) No results for input(s): PROBNP in the last 8760 hours. CBG: Recent Labs  Lab 08/19/21 0917  GLUCAP 112*   D-Dimer: No results for input(s): DDIMER in the last 72 hours. Hgb A1c: No results for input(s): HGBA1C in the last 72 hours. Lipid Profile: No results for input(s): CHOL, HDL, LDLCALC, TRIG, CHOLHDL, LDLDIRECT in the last 72 hours.  Thyroid function studies: No results for input(s): TSH, T4TOTAL, T3FREE, THYROIDAB in the last 72 hours.  Invalid input(s): FREET3 Anemia work up: No results for input(s): VITAMINB12, FOLATE, FERRITIN, TIBC, IRON, RETICCTPCT in the last 72 hours. Sepsis Labs: Recent  Labs  Lab 08/18/21 1627 08/19/21 0644 08/20/21 0535  WBC 7.7 8.2 7.3    Microbiology Recent Results (from the past 240 hour(s))  Resp Panel by RT-PCR (Flu A&B, Covid) Nasopharyngeal Swab     Status: None   Collection Time: 08/18/21  5:44 PM   Specimen: Nasopharyngeal Swab; Nasopharyngeal(NP) swabs in vial transport medium  Result Value Ref Range Status   SARS Coronavirus 2 by RT PCR NEGATIVE NEGATIVE Final    Comment: (NOTE) SARS-CoV-2 target nucleic acids are NOT DETECTED.  The SARS-CoV-2 RNA is generally detectable  in upper respiratory specimens during the acute phase of infection. The lowest concentration of SARS-CoV-2 viral copies this assay can detect is 138 copies/mL. A negative result does not preclude SARS-Cov-2 infection and should not be used as the sole basis for treatment or other patient management decisions. A negative result may occur with  improper specimen collection/handling, submission of specimen other than nasopharyngeal swab, presence of viral mutation(s) within the areas targeted by this assay, and inadequate number of viral copies(<138 copies/mL). A negative result must be combined with clinical observations, patient history, and epidemiological information. The expected result is Negative.  Fact Sheet for Patients:  EntrepreneurPulse.com.au  Fact Sheet for Healthcare Providers:  IncredibleEmployment.be  This test is no t yet approved or cleared by the [place] FDA and  has been authorized for detection and/or diagnosis of SARS-CoV-2 by FDA under an Emergency Use Authorization (EUA). This EUA will remain  in effect (meaning this test can be used) for the duration of the COVID-19 declaration under Section 564(b)(1) of the Act, 21 U.S.C.section 360bbb-3(b)(1), unless the authorization is terminated  or revoked sooner.       Influenza A by PCR NEGATIVE NEGATIVE Final   Influenza B by PCR NEGATIVE NEGATIVE  Final    Comment: (NOTE) The Xpert Xpress SARS-CoV-2/FLU/RSV plus assay is intended as an aid in the diagnosis of influenza from Nasopharyngeal swab specimens and should not be used as a sole basis for treatment. Nasal washings and aspirates are unacceptable for Xpert Xpress SARS-CoV-2/FLU/RSV testing.  Fact Sheet for Patients: EntrepreneurPulse.com.au  Fact Sheet for Healthcare Providers: IncredibleEmployment.be  This test is not yet approved or cleared by the [place] FDA and has been authorized for detection and/or diagnosis of SARS-CoV-2 by FDA under an Emergency Use Authorization (EUA). This EUA will remain in effect (meaning this test can be used) for the duration of the COVID-19 declaration under Section 564(b)(1) of the Act, 21 U.S.C. section 360bbb-3(b)(1), unless the authorization is terminated or revoked.  Performed at [facility], [address] [place], [place] [address]   MRSA Next Gen by PCR, Nasal     Status: None   Collection Time: 08/19/21  9:25 AM   Specimen: Nasal Mucosa; Nasal Swab  Result Value Ref Range Status   MRSA by PCR Next Gen NOT DETECTED NOT DETECTED Final    Comment: (NOTE) The GeneXpert MRSA Assay (FDA approved for NASAL specimens only), is one component of a comprehensive MRSA colonization surveillance program. It is not intended to diagnose MRSA infection nor to guide or monitor treatment for MRSA infections. Test performance is not FDA approved in patients less than 58 years old. Performed at [facility], [address], [place], [place] [address]     Procedures and diagnostic studies:  DG Chest [facility] 1 View  Result Date: 08/22/2021 CLINICAL DATA:  COPD. EXAM: PORTABLE CHEST 1 VIEW COMPARISON:  Chest radiograph, 08/18/2021 and 10/13/2016. FINDINGS: Cardiac silhouette is within normal limits. Aortic vascular calcifications. Pulmonary hyperinflation with coarse bibasilar  opacities no discrete consolidation. No large pleural effusion or pneumothorax. No acute osseous abnormality. IMPRESSION: 1. Obstructive pulmonary disease with increased prominence of coarse bibasilar opacities. Findings may reflect atelectasis though early superimposed pneumonia can appear similar. 2.  Aortic Atherosclerosis (ICD10-I70.0). Electronically Signed   By: Michaelle Birks M.D.   On: 08/22/2021 07:52               LOS: 4 days   [facility]   Pager  on [email or web address]. If 7PM-7AM, please contact night-coverage at [email or web address]     08/22/2021, 4:27 PM

## 2021-08-22 NOTE — Progress Notes (Signed)
CSW notes consult for heart failure screen, notified heart failure RN Jimsey.   [place], [place]

## 2021-08-22 NOTE — Progress Notes (Signed)
Pt c/o increasing intensity and frequency of CP despite multiple PRN doses of nitro, xanax and norco. Pt VS are labile with pt becoming hypertensive, tachycardic and desat to high 80s at times during episodes of pain. Pt reports pain decreases after admin of nitro but only briefly. Dr. Damita Dunnings notified of inability to control pt pain and anxiety overnight. No new orders received. Will continue frequent monitoring.

## 2021-08-23 DIAGNOSIS — I161 Hypertensive emergency: Secondary | ICD-10-CM

## 2021-08-23 DIAGNOSIS — I5021 Acute systolic (congestive) heart failure: Secondary | ICD-10-CM

## 2021-08-23 DIAGNOSIS — Z7189 Other specified counseling: Secondary | ICD-10-CM

## 2021-08-23 LAB — BASIC METABOLIC PANEL
Anion gap: 7 (ref 5–15)
BUN: 16 mg/dL (ref 8–23)
CO2: 25 mmol/L (ref 22–32)
Calcium: 8.8 mg/dL — ABNORMAL LOW (ref 8.9–10.3)
Chloride: 105 mmol/L (ref 98–111)
Creatinine, Ser: 0.86 mg/dL (ref 0.44–1.00)
GFR, Estimated: >60 mL/min (ref >60)
Glucose, Bld: 132 mg/dL — ABNORMAL HIGH (ref 70–99)
Potassium: 4.3 mmol/L (ref 3.5–5.1)
Sodium: 137 mmol/L (ref 135–145)

## 2021-08-23 MED ORDER — IPRATROPIUM-ALBUTEROL 0.5-2.5 (3) MG/3ML IN SOLN
3.0000 mL | Freq: Three times a day (TID) | RESPIRATORY_TRACT | Status: DC
  Filled 2021-08-23: qty 3

## 2021-08-23 MED ORDER — ALBUTEROL SULFATE (2.5 MG/3ML) 0.083% IN NEBU: 2.5000 mg | INHALATION_SOLUTION | RESPIRATORY_TRACT | Status: DC | PRN

## 2021-08-23 MED ORDER — ISOSORBIDE MONONITRATE ER 30 MG PO TB24: 30.0000 mg | ORAL_TABLET | Freq: Every day | ORAL | 0 refills | Status: DC

## 2021-08-23 MED ORDER — METOPROLOL TARTRATE 25 MG PO TABS: 12.5000 mg | ORAL_TABLET | Freq: Two times a day (BID) | ORAL | 0 refills | Status: DC

## 2021-08-23 MED ORDER — TICAGRELOR 90 MG PO TABS: 90.0000 mg | ORAL_TABLET | Freq: Two times a day (BID) | ORAL | 0 refills | Status: DC

## 2021-08-23 MED ORDER — NITROGLYCERIN 0.4 MG SL SUBL: 0.4000 mg | SUBLINGUAL_TABLET | SUBLINGUAL | 0 refills | Status: DC | PRN

## 2021-08-23 MED ORDER — RANOLAZINE ER 500 MG PO TB12: 500.0000 mg | ORAL_TABLET | Freq: Two times a day (BID) | ORAL | 0 refills | Status: DC

## 2021-08-23 NOTE — Progress Notes (Signed)
SUBJECTIVE: Samantha Dawson is a 87 y.o. female with medical history significant of COPD, chronic hypoxic respite failure on 2 L, HTN, HLD, CAD (I have one vessel blockage in my heart), osteoporosis, presented with new onset of chest pain and shortness of breath.   Patient sitting up in bed, no acute distress. Granddaughter at bedside. Patient reports no further chest pain, at rest and with movement. Shortness of breath unchanged. Remains on 4 L O2 via [place].   Vitals:   08/23/21 0332 08/23/21 0500 08/23/21 0752 08/23/21 0802  BP: 100/74  106/74   Pulse: 92  88 88  Resp: 18  20 18   Temp: 98.4 F (36.9 C)  98.7 F (37.1 C)   TempSrc:      SpO2: 95%  96% 93%  Weight:  50.4 kg    Height:        Intake/Output Summary (Last 24 hours) at 08/23/2021 0845 Last data filed at 08/22/2021 1825 Gross per 24 hour  Intake 380 ml  Output --  Net 380 ml    LABS: Basic Metabolic Panel: Recent Labs    08/22/21 0455 08/23/21 0500  NA 138 137  K 3.9 4.3  CL 106 105  CO2 24 25  GLUCOSE 128* 132*  BUN 17 16  CREATININE 0.80 0.86  CALCIUM 9.0 8.8*   Liver Function Tests: No results for input(s): AST, ALT, ALKPHOS, BILITOT, PROT, ALBUMIN in the last 72 hours. No results for input(s): LIPASE, AMYLASE in the last 72 hours. CBC: No results for input(s): WBC, NEUTROABS, HGB, HCT, MCV, PLT in the last 72 hours. Cardiac Enzymes: No results for input(s): CKTOTAL, CKMB, CKMBINDEX, TROPONINI in the last 72 hours. BNP: Invalid input(s): POCBNP D-Dimer: No results for input(s): DDIMER in the last 72 hours. Hemoglobin A1C: No results for input(s): HGBA1C in the last 72 hours. Fasting Lipid Panel: No results for input(s): CHOL, HDL, LDLCALC, TRIG, CHOLHDL, LDLDIRECT in the last 72 hours. Thyroid Function Tests: No results for input(s): TSH, T4TOTAL, T3FREE, THYROIDAB in the last 72 hours.  Invalid input(s): FREET3 Anemia Panel: No results for input(s): VITAMINB12, FOLATE, FERRITIN, TIBC, IRON,  RETICCTPCT in the last 72 hours.   PHYSICAL EXAM General: Well developed, well nourished, in no acute distress HEENT:  Normocephalic and atramatic Neck:  No JVD.  Lungs: Clear bilaterally to auscultation and percussion. Heart: HRRR . Normal S1 and S2 without gallops or murmurs.  Abdomen: Bowel sounds are positive, abdomen soft and non-tender  Msk:  Back normal, normal gait. Normal strength and tone for age. Extremities: No clubbing, cyanosis or edema.   Neuro: Alert and oriented X 3. Psych:  Good affect, responds appropriately  TELEMETRY: NSR, HR 93 bpm  ASSESSMENT AND PLAN: Patient chest pain free. Breathing unchanged. Discussed heart catheterization again. Patient declines at this time. Continue current dose of isosorbide and Ranexa.  Active Problems:   NSTEMI (non-ST elevated myocardial infarction) ([place])    Hamdan Toscano, FNP-C 08/23/2021 8:45 AM

## 2021-08-23 NOTE — Evaluation (Signed)
Physical Therapy Evaluation Patient Details Name: Samantha Dawson MRN: [id number] DOB: 01-17-1929 Today's Date: 08/23/2021  History of Present Illness  87 y.o. female presenting to ED with 1 week history of chest pain and SOB. Medical history sigificant for COPD, chronic hypoxic respiratory failure on 2 L/min oxygen, hypertension, hyperlipidemia, CAD, osteoporosis, squamous cell carcinoma.  Clinical Impression  Pt received supine in bed, pleasant and agreeable to PT evaluation. Pt states she has not been out of bed for 6 days and is very weak. She sat up to EOB w/o physical assistance however did require increased time and effort. Pt then stood from EOB with MOD A to lift using RW - pt was CGA to stand from [facility]. Pt did have bowel incontinence walking into the bathroom; PT cleaned pt and floor, assisted with donning new socks and gown. Pt demo ability to perform independent pericare while seated. Short distance ambulation (66ft and 73ft) led to SOB and muscular fatigue. SpO2 ranged from 95% at rest to 89% after 54ft ambulation on 2L O2. Pt niece called pt daughters to ensure 24 hour supervision at d/c - daughters agreeable. PT rec HHPT follow up with pt to continue strengthening and improving muscular endurance for improved QOL and safety during daily activities.    Recommendations for follow up therapy are one component of a multi-disciplinary discharge planning process, led by the attending physician.  Recommendations may be updated based on patient status, additional functional criteria and insurance authorization.  Follow Up Recommendations Home health PT;Supervision/Assistance - 24 hour    Equipment Recommendations  3in1 (PT)    Recommendations for Other Services       Precautions / Restrictions Precautions Precautions: Fall Restrictions Weight Bearing Restrictions: No      Mobility  Bed Mobility Overal bed mobility: Needs Assistance Bed Mobility: Supine to Sit     Supine to  sit: Supervision;HOB elevated     General bed mobility comments: increased time and effort    Transfers Overall transfer level: Needs assistance Equipment used: Rolling walker (2 wheeled) Transfers: Sit to/from Stand Sit to Stand: Mod assist         General transfer comment: MOD A to lift from EOB; CGA to steady from [facility]  Ambulation/Gait Ambulation/Gait assistance: Min guard Gait Distance (Feet): 20 Feet Assistive device: Rolling walker (2 wheeled) Gait Pattern/deviations: Step-through pattern;Decreased stride length;Narrow base of support;Trunk flexed Gait velocity: decreased   General Gait Details: 53ft>20ft using RW, CGA to steady to perform ambulatory toilet transfer. Pt fatigued and SOB with each bout of walking. Good navigation of obstacles.  Stairs            Wheelchair Mobility    Modified Rankin (Stroke Patients Only)       Balance Overall balance assessment: Needs assistance Sitting-balance support: Bilateral upper extremity supported Sitting balance-Leahy Scale: Fair Sitting balance - Comments: sitting EOB and on BSC, alternating between BUE and SUE support   Standing balance support: Bilateral upper extremity supported;During functional activity Standing balance-Leahy Scale: Poor Standing balance comment: BUE support on RW and CGA during ambulation, CGA during static standing to wash hands                             Pertinent Vitals/Pain Pain Assessment: No/denies pain    Home Living Family/patient expects to be discharged to:: Private residence Living Arrangements: Alone Available Help at Discharge: Family;Available 24 hours/day Type of Home: House Home Access: Stairs to enter  Entrance Stairs-Rails: Can reach both Entrance Stairs-Number of Steps: 1 Home Layout: One level Home Equipment: Walker - 2 wheels;Cane - single point;Shower seat Additional Comments: Occasionally uses SPC for longer distance community ambulation.  Otherwise, does not use AD.    Prior Function Level of Independence: Independent         Comments: Does not drive. States she is independent with ADLs and IADLs. Reports 0 falls in the last 6 months.     Hand Dominance        Extremity/Trunk Assessment   Upper Extremity Assessment Upper Extremity Assessment: Overall WFL for tasks assessed    Lower Extremity Assessment Lower Extremity Assessment: Overall WFL for tasks assessed (4-5/5 MMT general BLE however limited muscular endurance noted with activity)       Communication   Communication: HOH  Cognition Arousal/Alertness: Awake/alert Behavior During Therapy: WFL for tasks assessed/performed Overall Cognitive Status: Within Functional Limits for tasks assessed                                 General Comments: A&Ox4      General Comments General comments (skin integrity, edema, etc.): 2L via [place] at rest and with activity - 95% at rest and 89% after 31ft ambulation, recovering within 1 minute sitting    Exercises     Assessment/Plan    PT Assessment Patient needs continued PT services  PT Problem List Decreased strength;Decreased mobility;Decreased activity tolerance;Cardiopulmonary status limiting activity;Decreased balance       PT Treatment Interventions DME instruction;Therapeutic activities;Gait training;Therapeutic exercise;Patient/family education;Stair training;Balance training;Functional mobility training;Neuromuscular re-education    PT Goals (Current goals can be found in the Care Plan section)  Acute Rehab PT Goals Patient Stated Goal: to be more active PT Goal Formulation: With patient Time For Goal Achievement: 09/06/21 Potential to Achieve Goals: Good    Frequency Min 2X/week   Barriers to discharge        Co-evaluation               AM-PAC PT "6 Clicks" Mobility  Outcome Measure Help needed turning from your back to your side while in a flat bed without using  bedrails?: A Little Help needed moving from lying on your back to sitting on the side of a flat bed without using bedrails?: A Little Help needed moving to and from a bed to a chair (including a wheelchair)?: A Little Help needed standing up from a chair using your arms (e.g., wheelchair or bedside chair)?: A Little Help needed to walk in hospital room?: A Little Help needed climbing 3-5 steps with a railing? : A Little 6 Click Score: 18    End of Session Equipment Utilized During Treatment: Gait belt;Oxygen Activity Tolerance: Patient tolerated treatment well;Patient limited by fatigue Patient left: in chair;with call bell/phone within reach;with family/visitor present Nurse Communication: Mobility status PT Visit Diagnosis: Unsteadiness on feet (R26.81);Muscle weakness (generalized) (M62.81);Difficulty in walking, not elsewhere classified (R26.2)    Time: 1448-1856 PT Time Calculation (min) (ACUTE ONLY): 52 min   Charges:   PT Evaluation $PT Eval Moderate Complexity: 1 Mod PT Treatments $Gait Training: 8-22 mins $Therapeutic Activity: 23-37 mins        Patrina Levering PT, DPT 08/23/21 1:01 PM [phone]

## 2021-08-23 NOTE — Discharge Summary (Addendum)
Physician Discharge Summary  Samantha Dawson JDY:[id number] DOB: Dec 11, 1928 DOA: 08/18/2021  PCP: Steele Sizer, MD  Admit date: 08/18/2021 Discharge date: 08/23/2021  Discharge disposition: Home with home health therapy   Recommendations for Outpatient Follow-Up:   Follow-up with PCP in 1 week. Follow-up with Dr. Humphrey Rolls, cardiologist, on Monday, 08/29/2021   Discharge Diagnosis:   Principal Problem:   NSTEMI (non-ST elevated myocardial infarction) [facility]) Active Problems:   Acute systolic CHF (congestive heart failure) ([place])   Hypertensive emergency    Discharge Condition: Stable.  Diet recommendation:  Diet Order             Diet - low sodium heart healthy           Diet Heart Room service appropriate? Yes; Fluid consistency: Thin  Diet effective now                     Code Status: DNR     Hospital Course:   Ms. [name] is a 87 y.o. female with medical history significant for COPD, chronic hypoxic respiratory failure on 2 L/min oxygen, hypertension, hyperlipidemia, CAD, osteoporosis, squamous cell carcinoma.  She presented to the hospital because of 1 week history of chest pain and shortness of breath.   Troponins were elevated at 168 and 144.  She was admitted to the hospital for acute NSTEMI, hypertensive emergency, acute systolic CHF and acute on chronic diastolic CHF (EF was 35 to 40% on 2D echo).  She was treated with IV heparin infusion.  Patient declined left heart catheterization.  Cardiologist recommended isosorbide mononitrate, metoprolol and dual antiplatelet therapy with aspirin and Brilinta.  Hospital course was characterized by recurrent chest pain at rest.  Eventually, chest pain resolved and she was able to ambulate with the physical therapist.  Her condition has improved and she is deemed stable for discharge to home today.  She was not discharged on ACE inhibitor's or ARB's because of hypotension. Discharge plan was discussed with Amy,  granddaughter at the bedside  Medical Consultants:   Cardiologist   Discharge Exam:    Vitals:   08/23/21 0752 08/23/21 0802 08/23/21 1009 08/23/21 1552  BP: 106/74  108/82 95/60  Pulse: 88 88 91 93  Resp: $Remo'20 18 20 17  'qSJpo$ Temp: 98.7 F (37.1 C)  98.8 F (37.1 C) 98.7 F (37.1 C)  TempSrc:      SpO2: 96% 93% 98% 96%  Weight:      Height:         GEN: NAD SKIN: No rash EYES: EOMI ENT: MMM, hearing impairment CV: RRR PULM: Mild bibasilar rales.  No wheezing heard ABD: soft, ND, NT, +BS CNS: AAO x 3, non focal EXT: No edema or tenderness   The results of significant diagnostics from this hospitalization (including imaging, microbiology, ancillary and laboratory) are listed below for reference.     Procedures and Diagnostic Studies:   ECHOCARDIOGRAM COMPLETE  Result Date: 08/21/2021    ECHOCARDIOGRAM REPORT   Patient Name:   Samantha Dawson [facility] Date of Exam: 08/19/2021 Medical Rec #:  [id number]       Height:       60.0 in Accession #:    [id number]      Weight:       107.6 lb Date of Birth:  May 11, 1929       BSA:          1.434 m Patient Age:    61 years  BP:           107/47 mmHg Patient Gender: F               HR:           81 bpm. Exam Location:  [facility] Procedure: 2D Echo, Color Doppler and Cardiac Doppler Indications:     I21.4 NSTEMI  History:         Patient has prior history of Echocardiogram examinations. Risk                  Factors:Former Smoker, Hypertension and Dyslipidemia.  Sonographer:     Charmayne Sheer Referring Phys:  [id number] Lequita Halt Diagnosing Phys: Neoma Laming  Sonographer Comments: Image acquisition challenging due to uncooperative patient. IMPRESSIONS  1. Left ventricular ejection fraction, by estimation, is 35 to 40%. The left ventricle has moderately decreased function. The left ventricle demonstrates global hypokinesis. There is severe asymmetric left ventricular hypertrophy of the basal-septal segment. Left ventricular diastolic parameters are  consistent with Grade III diastolic dysfunction (restrictive).  2. Right ventricular systolic function is normal. The right ventricular size is normal.  3. Left atrial size was moderately dilated.  4. Right atrial size was moderately dilated.  5. The mitral valve is degenerative. Trivial mitral valve regurgitation. Mild mitral stenosis. Severe mitral annular calcification.  6. The aortic valve is calcified. Aortic valve regurgitation is trivial. Mild to moderate aortic valve sclerosis/calcification is present, without any evidence of aortic stenosis.  7. The inferior vena cava is normal in size with greater than 50% respiratory variability, suggesting right atrial pressure of 3 mmHg. FINDINGS  Left Ventricle: Left ventricular ejection fraction, by estimation, is 35 to 40%. The left ventricle has moderately decreased function. The left ventricle demonstrates global hypokinesis. The left ventricular internal cavity size was normal in size. There is severe asymmetric left ventricular hypertrophy of the basal-septal segment. Left ventricular diastolic parameters are consistent with Grade III diastolic dysfunction (restrictive). Right Ventricle: The right ventricular size is normal. No increase in right ventricular wall thickness. Right ventricular systolic function is normal. Left Atrium: Left atrial size was moderately dilated. Right Atrium: Right atrial size was moderately dilated. Pericardium: There is no evidence of pericardial effusion. Mitral Valve: The mitral valve is degenerative in appearance. There is severe calcification of the mitral valve leaflet(s). Mildly decreased mobility of the mitral valve leaflets. Severe mitral annular calcification. Trivial mitral valve regurgitation. Mild mitral valve stenosis. MV peak gradient, 5.6 mmHg. The mean mitral valve gradient is 3.0 mmHg. Tricuspid Valve: The tricuspid valve is normal in structure. Tricuspid valve regurgitation is mild . No evidence of tricuspid  stenosis. Aortic Valve: The aortic valve is calcified. Aortic valve regurgitation is trivial. Mild to moderate aortic valve sclerosis/calcification is present, without any evidence of aortic stenosis. Aortic valve mean gradient measures 12.0 mmHg. Aortic valve peak gradient measures 21.2 mmHg. Aortic valve area, by VTI measures 1.65 cm. Pulmonic Valve: The pulmonic valve was normal in structure. Pulmonic valve regurgitation is not visualized. No evidence of pulmonic stenosis. Aorta: The aortic root is normal in size and structure. Venous: The inferior vena cava is normal in size with greater than 50% respiratory variability, suggesting right atrial pressure of 3 mmHg. IAS/Shunts: No atrial level shunt detected by color flow Doppler.  LEFT VENTRICLE PLAX 2D LVIDd:         2.70 cm     Diastology LVIDs:         2.50 cm  LV e' medial:    3.37 cm/s LV PW:         1.30 cm     LV E/e' medial:  30.3 LV IVS:        1.30 cm     LV e' lateral:   4.46 cm/s LVOT diam:     2.20 cm     LV E/e' lateral: 22.9 LV SV:         60 LV SV Index:   42 LVOT Area:     3.80 cm  LV Volumes (MOD) LV vol d, MOD A2C: 43.5 ml LV vol d, MOD A4C: 21.8 ml LV vol s, MOD A2C: 23.6 ml LV vol s, MOD A4C: 17.5 ml LV SV MOD A2C:     19.9 ml LV SV MOD A4C:     21.8 ml LV SV MOD BP:      12.2 ml RIGHT VENTRICLE RV Basal diam:  2.80 cm LEFT ATRIUM             Index        RIGHT ATRIUM          Index LA diam:        3.50 cm 2.44 cm/m   RA Area:     6.86 cm LA Vol (A2C):   30.8 ml 21.48 ml/m  RA Volume:   11.90 ml 8.30 ml/m LA Vol (A4C):   39.6 ml 27.61 ml/m LA Biplane Vol: 35.8 ml 24.96 ml/m  AORTIC VALVE                     PULMONIC VALVE AV Area (Vmax):    1.37 cm      PV Vmax:       0.89 m/s AV Area (Vmean):   1.38 cm      PV Vmean:      67.700 cm/s AV Area (VTI):     1.65 cm      PV VTI:        0.169 m AV Vmax:           230.00 cm/s   PV Peak grad:  3.2 mmHg AV Vmean:          165.000 cm/s  PV Mean grad:  2.0 mmHg AV VTI:            0.364 m  AV Peak Grad:      21.2 mmHg AV Mean Grad:      12.0 mmHg LVOT Vmax:         82.90 cm/s LVOT Vmean:        59.800 cm/s LVOT VTI:          0.158 m LVOT/AV VTI ratio: 0.43  AORTA Ao Root diam: 2.90 cm MITRAL VALVE MV Area (PHT): 3.77 cm     SHUNTS MV Area VTI:   1.73 cm     Systemic VTI:  0.16 m MV Peak grad:  5.6 mmHg     Systemic Diam: 2.20 cm MV Mean grad:  3.0 mmHg MV Vmax:       1.18 m/s MV Vmean:      81.4 cm/s MV Decel Time: 201 msec MV E velocity: 102.00 cm/s MV A velocity: 116.00 cm/s MV E/A ratio:  0.88 Shaukat Khan Electronically signed by Neoma Laming Signature Date/Time: 08/21/2021/3:12:23 PM    Final      Labs:   Basic Metabolic Panel: Recent Labs  Lab 08/19/21 0962 08/20/21 0535 08/21/21 0555 08/22/21 0455 08/23/21 0500  NA 140 140 138 138 137  K 4.1 4.0 3.9 3.9 4.3  CL 106 106 107 106 105  CO2 $Re'27 28 28 24 25  'fHI$ GLUCOSE 117* 117* 103* 128* 132*  BUN 27* $Remov'23 17 17 16  'nodiHr$ CREATININE 0.78 0.87 0.84 0.80 0.86  CALCIUM 8.9 9.0 8.7* 9.0 8.8*   GFR Estimated Creatinine Clearance: 31.9 mL/min (by C-G formula based on SCr of 0.86 mg/dL). Liver Function Tests: No results for input(s): AST, ALT, ALKPHOS, BILITOT, PROT, ALBUMIN in the last 168 hours. No results for input(s): LIPASE, AMYLASE in the last 168 hours. No results for input(s): AMMONIA in the last 168 hours. Coagulation profile Recent Labs  Lab 08/18/21 1900  INR 1.1    CBC: Recent Labs  Lab 08/18/21 1627 08/19/21 0644 08/20/21 0535  WBC 7.7 8.2 7.3  HGB 14.0 12.6 12.0  HCT 41.9 36.8 36.9  MCV 101.5* 99.7 99.7  PLT 143* 134* 143*   Cardiac Enzymes: No results for input(s): CKTOTAL, CKMB, CKMBINDEX, TROPONINI in the last 168 hours. BNP: Invalid input(s): POCBNP CBG: Recent Labs  Lab 08/19/21 0917  GLUCAP 112*   D-Dimer No results for input(s): DDIMER in the last 72 hours. Hgb A1c No results for input(s): HGBA1C in the last 72 hours. Lipid Profile No results for input(s): CHOL, HDL, LDLCALC, TRIG,  CHOLHDL, LDLDIRECT in the last 72 hours. Thyroid function studies No results for input(s): TSH, T4TOTAL, T3FREE, THYROIDAB in the last 72 hours.  Invalid input(s): FREET3 Anemia work up No results for input(s): VITAMINB12, FOLATE, FERRITIN, TIBC, IRON, RETICCTPCT in the last 72 hours. Microbiology Recent Results (from the past 240 hour(s))  Resp Panel by RT-PCR (Flu A&B, Covid) Nasopharyngeal Swab     Status: None   Collection Time: 08/18/21  5:44 PM   Specimen: Nasopharyngeal Swab; Nasopharyngeal(NP) swabs in vial transport medium  Result Value Ref Range Status   SARS Coronavirus 2 by RT PCR NEGATIVE NEGATIVE Final    Comment: (NOTE) SARS-CoV-2 target nucleic acids are NOT DETECTED.  The SARS-CoV-2 RNA is generally detectable in upper respiratory specimens during the acute phase of infection. The lowest concentration of SARS-CoV-2 viral copies this assay can detect is 138 copies/mL. A negative result does not preclude SARS-Cov-2 infection and should not be used as the sole basis for treatment or other patient management decisions. A negative result may occur with  improper specimen collection/handling, submission of specimen other than nasopharyngeal swab, presence of viral mutation(s) within the areas targeted by this assay, and inadequate number of viral copies(<138 copies/mL). A negative result must be combined with clinical observations, patient history, and epidemiological information. The expected result is Negative.  Fact Sheet for Patients:  EntrepreneurPulse.com.au  Fact Sheet for Healthcare Providers:  IncredibleEmployment.be  This test is no t yet approved or cleared by the [place] FDA and  has been authorized for detection and/or diagnosis of SARS-CoV-2 by FDA under an Emergency Use Authorization (EUA). This EUA will remain  in effect (meaning this test can be used) for the duration of the COVID-19 declaration under Section  564(b)(1) of the Act, 21 U.S.C.section 360bbb-3(b)(1), unless the authorization is terminated  or revoked sooner.       Influenza A by PCR NEGATIVE NEGATIVE Final   Influenza B by PCR NEGATIVE NEGATIVE Final    Comment: (NOTE) The Xpert Xpress SARS-CoV-2/FLU/RSV plus assay is intended as an aid in the diagnosis of influenza from Nasopharyngeal swab specimens and should not be used as a sole basis for treatment.  Nasal washings and aspirates are unacceptable for Xpert Xpress SARS-CoV-2/FLU/RSV testing.  Fact Sheet for Patients: EntrepreneurPulse.com.au  Fact Sheet for Healthcare Providers: IncredibleEmployment.be  This test is not yet approved or cleared by the [place] FDA and has been authorized for detection and/or diagnosis of SARS-CoV-2 by FDA under an Emergency Use Authorization (EUA). This EUA will remain in effect (meaning this test can be used) for the duration of the COVID-19 declaration under Section 564(b)(1) of the Act, 21 U.S.C. section 360bbb-3(b)(1), unless the authorization is terminated or revoked.  Performed at [facility], [address] [place], [place] [address]   MRSA Next Gen by PCR, Nasal     Status: None   Collection Time: 08/19/21  9:25 AM   Specimen: Nasal Mucosa; Nasal Swab  Result Value Ref Range Status   MRSA by PCR Next Gen NOT DETECTED NOT DETECTED Final    Comment: (NOTE) The GeneXpert MRSA Assay (FDA approved for NASAL specimens only), is one component of a comprehensive MRSA colonization surveillance program. It is not intended to diagnose MRSA infection nor to guide or monitor treatment for MRSA infections. Test performance is not FDA approved in patients less than 10 years old. Performed at [facility], [address] [place], [place] [address]      Discharge Instructions:   Discharge Instructions     AMB Referral to Phase II Cardiac Rehab   Complete by: As  directed    Diagnosis: NSTEMI   After initial evaluation and assessments completed: Virtual Based Care may be provided alone or in conjunction with Phase 2 Cardiac Rehab based on patient barriers.: Yes   Diet - low sodium heart healthy   Complete by: As directed    Discharge wound care:   Complete by: As directed    Keep wound clean and dry   Increase activity slowly   Complete by: As directed       Allergies as of 08/23/2021       Reactions   Alendronate Nausea Only        Medication List     STOP taking these medications    doxycycline 100 MG tablet Commonly known as: VIBRA-TABS       TAKE these medications    albuterol 108 (90 Base) MCG/ACT inhaler Commonly known as: ProAir HFA INHALE 2 PUFFS INTO THE LUNGS EVERY 4 HOURS AS NEEDED FOR WHEEZING OR SHORTNESS OF BREATH; FUTURE REFILLS FROM LUNG DOCTOR   Anoro Ellipta 62.5-25 MCG/ACT Aepb Generic drug: umeclidinium-vilanterol INHALE 1 PUFF INTO THE LUNGS DAILY   aspirin 81 MG tablet Take 81 mg by mouth daily.   co-enzyme Q-10 30 MG capsule Take 100 mg by mouth daily.   fluorouracil 5 % cream Commonly known as: EFUDEX Apply 1 application topically 2 (two) times daily as needed.   HYDROcodone-acetaminophen 5-325 MG tablet Commonly known as: NORCO/VICODIN Take 1 tablet by mouth 3 (three) times daily as needed for moderate pain. Fill September 07/31/2021   HYDROcodone-acetaminophen 5-325 MG tablet Commonly known as: NORCO/VICODIN Take 1 tablet by mouth 3 (three) times daily as needed for moderate pain. To last one month   HYDROcodone-acetaminophen 5-325 MG tablet Commonly known as: NORCO/VICODIN Take 1 tablet by mouth 3 (three) times daily as needed for moderate pain. To last one month   isosorbide mononitrate 30 MG 24 hr tablet Commonly known as: IMDUR Take 1 tablet (30 mg total) by mouth daily. Start taking on: August 24, 2021   metoprolol tartrate 25 MG  tablet Commonly known as: LOPRESSOR Take  0.5 tablets (12.5 mg total) by mouth 2 (two) times daily.   naloxone 4 MG/0.1ML Liqd nasal spray kit Commonly known as: NARCAN Prn narcotic overdose   nitroGLYCERIN 0.4 MG SL tablet Commonly known as: NITROSTAT Place 1 tablet (0.4 mg total) under the tongue every 5 (five) minutes x 3 doses as needed for chest pain. Do not take more than 3 doses in a day.   pantoprazole 20 MG tablet Commonly known as: Protonix Take 1 tablet (20 mg total) by mouth 2 (two) times daily. Caution:prolonged use may increase risk of pneumonia, colitis, osteoporosis, anemia.  Skip doses if you can   ranolazine 500 MG 12 hr tablet Commonly known as: RANEXA Take 1 tablet (500 mg total) by mouth 2 (two) times daily.   rosuvastatin 10 MG tablet Commonly known as: CRESTOR Take 1 tablet (10 mg total) by mouth daily.   ticagrelor 90 MG Tabs tablet Commonly known as: BRILINTA Take 1 tablet (90 mg total) by mouth 2 (two) times daily.               Discharge Care Instructions  (From admission, onward)           Start     Ordered   08/23/21 0000  Discharge wound care:       Comments: Keep wound clean and dry   08/23/21 1616            Follow-up Information     Dionisio David, MD. Schedule an appointment as soon as possible for a visit on 08/29/2021.   Specialty: Cardiology Contact information: [place] [address] [phone]                   If you experience worsening of your admission symptoms, develop shortness of breath, life threatening emergency, suicidal or homicidal thoughts you must seek medical attention immediately by calling 911 or calling your MD immediately  if symptoms less severe.   You must read complete instructions/literature along with all the possible adverse reactions/side effects for all the medicines you take and that have been prescribed to you. Take any new medicines after you have completely understood and accept all the possible  adverse reactions/side effects.    Please note   You were cared for by a hospitalist during your hospital stay. If you have any questions about your discharge medications or the care you received while you were in the hospital after you are discharged, you can call the unit and asked to speak with the hospitalist on call if the hospitalist that took care of you is not available. Once you are discharged, your primary care physician will handle any further medical issues. Please note that NO REFILLS for any discharge medications will be authorized once you are discharged, as it is imperative that you return to your primary care physician (or establish a relationship with a primary care physician if you do not have one) for your aftercare needs so that they can reassess your need for medications and monitor your lab values.       Time coordinating discharge: 33 minutes  Signed:  Jil Penland  [facility] 08/23/2021, 9:41 PM   Pager on [email or web address]. If 7PM-7AM, please contact night-coverage at [email or web address]

## 2021-08-23 NOTE — Plan of Care (Signed)
Full note to follow. Patient is discharging shortly.    Recommend outpatient palliative to follow.

## 2021-08-24 ENCOUNTER — Telehealth: Payer: Self-pay

## 2021-08-24 ENCOUNTER — Encounter: Payer: Self-pay | Admitting: Cardiology

## 2021-08-24 NOTE — Telephone Encounter (Signed)
Transition Care Management Follow-up Telephone Call Date of discharge and from where: 08/23/21 [facility] How have you been since you were released from the hospital? Pt states she is doing okay Any questions or concerns? No  Items Reviewed: Did the pt receive and understand the discharge instructions provided? Yes  Medications obtained and verified? Yes  Other? No  Any new allergies since your discharge? No  Dietary orders reviewed? Yes Do you have support at home? Yes   Home Care and Equipment/Supplies: Were home health services ordered? no Were any new equipment or medical supplies ordered?  No   Functional Questionnaire: (I = Independent and D = Dependent) ADLs: I  Bathing/Dressing- I  Meal Prep- I  Eating- I  Maintaining continence- I  Transferring/Ambulation- I  Managing Meds- I with assistance  Follow up appointments reviewed:  PCP Hospital f/u appt confirmed? Yes  Scheduled to see Dr. Ancil Boozer on 08/29/21 @ 10:20. [facility] f/u appt confirmed? Yes  Scheduled to see Dr. Humphrey Rolls on 08/29/21. Are transportation arrangements needed? No  If their condition worsens, is the pt aware to call PCP or go to the Emergency Dept.? Yes Was the patient provided with contact information for the PCP's office or ED? Yes Was to pt encouraged to call back with questions or concerns? Yes

## 2021-08-26 NOTE — Consult Note (Signed)
l                                                                                   Consultation Note Date: 08/26/2021   Patient Name: Samantha Dawson  DOB: 21-May-1929  MRN: [id number]  Age / Sex: 87 y.o., female  PCP: Steele Sizer, MD Referring Physician: No att. providers found  Reason for Consultation: Establishing goals of care  HPI/Patient Profile: Samantha Dawson is a 87 y.o. female with medical history significant of COPD, chronic hypoxic respite failure on 2 L, HTN, HLD, CAD (I have one vessel blockage in my heart), osteoporosis, presented with new onset of chest pain and shortness of breath.  Clinical Assessment and Goals of Care: Patient is sitting in bedside chair with granddaughter at bedside. Patient states she lives alone and is fully independent at baseline. She states her family will come to help as needed.    We discussed her diagnosis, prognosis, GOC, EOL wishes disposition and options.  Created space and opportunity for patient  to explore thoughts and feelings regarding current medical information.   A detailed discussion was had today regarding advanced directives.  Concepts specific to code status, artifical feeding and hydration, IV antibiotics and rehospitalization were discussed.  The difference between an aggressive medical intervention path and a comfort care path was discussed.  Values and goals of care important to patient and family were attempted to be elicited.  Discussed limitations of medical interventions to prolong quality of life in some situations and discussed the concept of human mortality.  She states she is not interested in a cardiac cath as surgery would not be an option. She states she would like to continue to treat the treatable at this time without changes to current health plans.   SUMMARY OF RECOMMENDATIONS   Recommend outpatient palliative  Prognosis:  Poor overall       Primary Diagnoses: Present on Admission:  Acute  systolic CHF (congestive heart failure) ([place])  Hypertensive emergency   I have reviewed the medical record, interviewed the patient and family, and examined the patient. The following aspects are pertinent.  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   Encounter for chronic pain management    Hx of smoking 04/17/2016   Hyperlipidemia    Hypertension    Macular degeneration    Osteoarthritis    Osteoporosis 03/22/2016   Primary osteoarthritis of both hands 05/03/2016   Social History   Socioeconomic History   Marital status: Widowed    Spouse name: Ihor Gully   Number of children: 3   Years of education: Not on file   Highest education level: 10th grade  Occupational History   Occupation: Retired  Tobacco Use   Smoking status: Former    Packs/day: 1.00    Years: 50.00    Pack years: 50.00    Types: Cigarettes    Quit date: 2017    Years since  quitting: 5.8    Passive exposure: Never   Smokeless tobacco: Never   Tobacco comments:    smoking cesssation materials not required  Vaping Use   Vaping Use: Never used  Substance and Sexual Activity   Alcohol use: No    Alcohol/week: 0.0 standard drinks   Drug use: No   Sexual activity: Not Currently    Birth control/protection: None  Other Topics Concern   Not on file  Social History Narrative   Pt lives alone   Social Determinants of Health   Financial Resource Strain: Low Risk    Difficulty of Paying Living Expenses: Not hard at all  Food Insecurity: No Food Insecurity   Worried About Charity fundraiser in the Last Year: Never true   Ran Out of Food in the Last Year: Never true  Transportation Needs: No Transportation Needs   Lack of Transportation (Medical): No   Lack of  Transportation (Non-Medical): No  Physical Activity: Inactive   Days of Exercise per Week: 0 days   Minutes of Exercise per Session: 0 min  Stress: No Stress Concern Present   Feeling of Stress : Only a little  Social Connections: Moderately Isolated   Frequency of Communication with Friends and Family: More than three times a week   Frequency of Social Gatherings with Friends and Family: More than three times a week   Attends Religious Services: More than 4 times per year   Active Member of [employer] or Organizations: No   Attends Archivist Meetings: Never   Marital Status: Widowed   Family History  Problem Relation Age of Onset   Cancer Mother    Coronary artery disease Father    Cerebrovascular Accident Father    Cancer Sister        breast cancer   Cancer Brother        stomach   Heart disease Daughter        heart vavle bypass?   Stroke Sister    Lung disease Brother    Cancer Brother        unknown   Arthritis Daughter    Scheduled Meds: Continuous Infusions: PRN Meds:. Medications Prior to Admission:  Prior to Admission medications   Medication Sig Start Date End Date Taking? Authorizing Provider  ANORO ELLIPTA 62.5-25 MCG/INH AEPB INHALE 1 PUFF INTO THE LUNGS DAILY 08/10/21  Yes Sowles, Drue Stager, MD  aspirin 81 MG tablet Take 81 mg by mouth daily.   Yes [provider]  co-enzyme Q-10 30 MG capsule Take 100 mg by mouth daily.    Yes [provider]  HYDROcodone-acetaminophen (NORCO/VICODIN) 5-325 MG tablet Take 1 tablet by mouth 3 (three) times daily as needed for moderate pain. Fill September 07/31/2021 06/01/21  Yes Sowles, Drue Stager, MD  albuterol (PROAIR HFA) 108 (90 Base) MCG/ACT inhaler INHALE 2 PUFFS INTO THE LUNGS EVERY 4 HOURS AS NEEDED FOR WHEEZING OR SHORTNESS OF BREATH; FUTURE REFILLS FROM LUNG DOCTOR 04/26/20   Steele Sizer, MD  fluorouracil (EFUDEX) 5 % cream Apply 1 application topically 2 (two) times daily as needed. 06/16/21    [provider]  HYDROcodone-acetaminophen (NORCO/VICODIN) 5-325 MG tablet Take 1 tablet by mouth 3 (three) times daily as needed for moderate pain. To last one month 06/01/21   Steele Sizer, MD  HYDROcodone-acetaminophen (NORCO/VICODIN) 5-325 MG tablet Take 1 tablet by mouth 3 (three) times daily as needed for moderate pain. To last one month 06/01/21   Steele Sizer, MD  isosorbide  mononitrate (IMDUR) 30 MG 24 hr tablet Take 1 tablet (30 mg total) by mouth daily. 08/24/21   Jennye Boroughs, MD  metoprolol tartrate (LOPRESSOR) 25 MG tablet Take 0.5 tablets (12.5 mg total) by mouth 2 (two) times daily. 08/23/21   Jennye Boroughs, MD  naloxone [facility]) nasal spray 4 mg/0.1 mL Prn narcotic overdose 03/02/21   Steele Sizer, MD  nitroGLYCERIN (NITROSTAT) 0.4 MG SL tablet Place 1 tablet (0.4 mg total) under the tongue every 5 (five) minutes x 3 doses as needed for chest pain. Do not take more than 3 doses in a day. 08/23/21   Jennye Boroughs, MD  pantoprazole (PROTONIX) 20 MG tablet Take 1 tablet (20 mg total) by mouth 2 (two) times daily. Caution:prolonged use may increase risk of pneumonia, colitis, osteoporosis, anemia.  Skip doses if you can 11/30/20   Steele Sizer, MD  ranolazine (RANEXA) 500 MG 12 hr tablet Take 1 tablet (500 mg total) by mouth 2 (two) times daily. 08/23/21   Jennye Boroughs, MD  rosuvastatin (CRESTOR) 20 MG tablet Take 20 mg by mouth daily. 08/11/21   [provider]  ticagrelor (BRILINTA) 90 MG TABS tablet Take 1 tablet (90 mg total) by mouth 2 (two) times daily. 08/23/21   Jennye Boroughs, MD   Allergies  Allergen Reactions   Alendronate Nausea Only   Review of Systems  All other systems reviewed and are negative.  Physical Exam Pulmonary:     Effort: Pulmonary effort is normal.  Neurological:     Mental Status: She is alert.    Vital Signs: BP 95/60 (BP Location: Left Arm)   Pulse 93   Temp 98.7 F (37.1 C)   Resp 17   Ht 5' (1.524 m)   Wt 50.4  kg   SpO2 96%   BMI 21.70 kg/m  Pain Scale: 0-10   Pain Score: Asleep   SpO2: SpO2: 96 % O2 Device:SpO2: 96 % O2 Flow Rate: .O2 Flow Rate (L/min): 2 L/min  IO: Intake/output summary: No intake or output data in the 24 hours ending 08/26/21 1344  LBM: Last BM Date:  (PTA) Baseline Weight: Weight: 50.1 kg Most recent weight: Weight: 50.4 kg        Time In: 4:10 Time Out: 4:40 Time Total: 30 min Greater than 50%  of this time was spent counseling and coordinating care related to the above assessment and plan.  Signed by: Asencion Gowda, NP   Please contact Palliative Medicine Team phone at [phone] for questions and concerns.  For individual provider: See Shea Evans

## 2021-08-29 ENCOUNTER — Ambulatory Visit (INDEPENDENT_AMBULATORY_CARE_PROVIDER_SITE_OTHER): Payer: Medicare Other | Admitting: Family Medicine

## 2021-08-29 ENCOUNTER — Other Ambulatory Visit: Payer: Self-pay

## 2021-08-29 ENCOUNTER — Encounter: Payer: Self-pay | Admitting: Family Medicine

## 2021-08-29 VITALS — BP 110/64 | HR 87 | Temp 97.9°F | Resp 16 | Ht 60.0 in | Wt 102.0 lb

## 2021-08-29 DIAGNOSIS — D696 Thrombocytopenia, unspecified: Secondary | ICD-10-CM

## 2021-08-29 DIAGNOSIS — I2510 Atherosclerotic heart disease of native coronary artery without angina pectoris: Secondary | ICD-10-CM | POA: Diagnosis not present

## 2021-08-29 DIAGNOSIS — I739 Peripheral vascular disease, unspecified: Secondary | ICD-10-CM | POA: Diagnosis not present

## 2021-08-29 DIAGNOSIS — R5381 Other malaise: Secondary | ICD-10-CM

## 2021-08-29 DIAGNOSIS — I5031 Acute diastolic (congestive) heart failure: Secondary | ICD-10-CM

## 2021-08-29 DIAGNOSIS — R079 Chest pain, unspecified: Secondary | ICD-10-CM | POA: Diagnosis not present

## 2021-08-29 DIAGNOSIS — I214 Non-ST elevation (NSTEMI) myocardial infarction: Secondary | ICD-10-CM

## 2021-08-29 DIAGNOSIS — Z09 Encounter for follow-up examination after completed treatment for conditions other than malignant neoplasm: Secondary | ICD-10-CM

## 2021-08-29 DIAGNOSIS — I10 Essential (primary) hypertension: Secondary | ICD-10-CM | POA: Diagnosis not present

## 2021-08-29 DIAGNOSIS — E7849 Other hyperlipidemia: Secondary | ICD-10-CM | POA: Diagnosis not present

## 2021-08-29 NOTE — Progress Notes (Signed)
Name: Samantha Dawson   MRN: [id number]    DOB: 04-May-1929   Date:08/29/2021       Progress Note  Subjective  Chief Complaint  Follow Up  HPI  Hospital discharge follow up:  She came in on wheelchair - borrowed from a friend - also using nasal canula oxygen Samantha Dawson , Sonny Dandy brought her in   Admission: 08/18/2021 Discharge : 08/23/2021   Samantha Dawson lives alone, she developed chest pain and SOB 5 days prior to going to [facility] on 08/18/21. She was found to have very high troponin and diagnosied with NSTEMI and acute CHF. She had an echo that showed diastolic dyfunction with EF 35-40 %, she was sent home with medication management, unable to take ACE or ARB due to hypotension. She states woke up a few days with chest tightness but not over the past couple of days. She states SOB is back to baseline, still on nasal canula oxygen for COPD. She has been feeling very weak since discharge due to deconditioning, Samantha Dawson asked if she can have any help from home health. She denies lower extremity edema and still uses two pillows ( chronic and stable) She is not interested on having cath done and has follow up with cardiologist this afternoon   Medication reconciliation was done  Reviewe labs, explained platelets was slightly low but we can recheck next visit   Patient Active Problem List   Diagnosis Date Noted   Acute systolic CHF (congestive heart failure) ([place]) 08/23/2021   Hypertensive emergency 08/23/2021   NSTEMI (non-ST elevated myocardial infarction) ([place]) 08/18/2021   History of nonmelanoma skin cancer 06/06/2020   Senile purpura ([place]) 05/04/2020   Nocturnal hypoxemia due to emphysema ([place]) 05/04/2020   Carotid atherosclerosis, bilateral 08/27/2018   Bilateral carotid bruits 08/27/2018   CKD (chronic kidney disease) stage 3, GFR 30-59 ml/min (HCC) 08/27/2018   Age-related osteoporosis without current pathological fracture 08/11/2018   Shoulder pain, left 08/29/2017   Acid reflux  08/22/5101   Uncomplicated opioid use 58/52/7782   Hyperglycemia 09/02/2016   Left thyroid nodule 07/13/2016   Abnormal bone scan of cervical spine 06/18/2016   Primary osteoarthritis of both hands 05/03/2016   Elevated serum alkaline phosphatase level 05/03/2016   Hyperkalemia 05/03/2016   Degenerative disc disease, thoracic 05/03/2016   Hx of smoking 04/17/2016   Controlled substance agreement signed 03/22/2016   Back pain 03/22/2016   Hyperlipidemia 10/25/2015   Essential hypertension 10/25/2015   Panlobular emphysema ([place]) 10/25/2015   Chronic pain 05/25/2015   Actinic keratosis 05/25/2015    Past Surgical History:  Procedure Laterality Date   ABDOMINAL HYSTERECTOMY     CATARACT EXTRACTION     endaryerectomy     renal stenting      Family History  Problem Relation Age of Onset   Cancer Mother    Coronary artery disease Father    Cerebrovascular Accident Father    Cancer Sister        breast cancer   Cancer Brother        stomach   Heart disease Samantha Dawson        heart vavle bypass?   Stroke Sister    Lung disease Brother    Cancer Brother        unknown   Arthritis Samantha Dawson     Social History   Tobacco Use   Smoking status: Former    Packs/day: 1.00    Years: 50.00    Pack years: 50.00  Types: Cigarettes    Quit date: 2017    Years since quitting: 5.8    Passive exposure: Never   Smokeless tobacco: Never   Tobacco comments:    smoking cesssation materials not required  Substance Use Topics   Alcohol use: No    Alcohol/week: 0.0 standard drinks     Current Outpatient Medications:    aspirin 81 MG tablet, Take 81 mg by mouth daily., Disp: , Rfl:    HYDROcodone-acetaminophen (NORCO/VICODIN) 5-325 MG tablet, Take 1 tablet by mouth 3 (three) times daily as needed for moderate pain. Fill September 07/31/2021, Disp: 90 tablet, Rfl: 0   HYDROcodone-acetaminophen (NORCO/VICODIN) 5-325 MG tablet, Take 1 tablet by mouth 3 (three) times daily as needed for  moderate pain. To last one month, Disp: 90 tablet, Rfl: 0   HYDROcodone-acetaminophen (NORCO/VICODIN) 5-325 MG tablet, Take 1 tablet by mouth 3 (three) times daily as needed for moderate pain. To last one month, Disp: 90 tablet, Rfl: 0   pantoprazole (PROTONIX) 20 MG tablet, Take 1 tablet (20 mg total) by mouth 2 (two) times daily. Caution:prolonged use may increase risk of pneumonia, colitis, osteoporosis, anemia.  Skip doses if you can, Disp: 180 tablet, Rfl: 1   ticagrelor (BRILINTA) 90 MG TABS tablet, Take 1 tablet (90 mg total) by mouth 2 (two) times daily., Disp: 60 tablet, Rfl: 0   albuterol (PROAIR HFA) 108 (90 Base) MCG/ACT inhaler, INHALE 2 PUFFS INTO THE LUNGS EVERY 4 HOURS AS NEEDED FOR WHEEZING OR SHORTNESS OF BREATH; FUTURE REFILLS FROM LUNG DOCTOR (Patient not taking: Reported on 08/29/2021), Disp: 8.5 g, Rfl: 3   ANORO ELLIPTA 62.5-25 MCG/INH AEPB, INHALE 1 PUFF INTO THE LUNGS DAILY (Patient not taking: Reported on 08/29/2021), Disp: 60 each, Rfl: 0   co-enzyme Q-10 30 MG capsule, Take 100 mg by mouth daily.  (Patient not taking: Reported on 08/29/2021), Disp: , Rfl:    fluorouracil (EFUDEX) 5 % cream, Apply 1 application topically 2 (two) times daily as needed. (Patient not taking: Reported on 08/29/2021), Disp: , Rfl:    isosorbide mononitrate (IMDUR) 30 MG 24 hr tablet, Take 1 tablet (30 mg total) by mouth daily. (Patient not taking: Reported on 08/29/2021), Disp: 30 tablet, Rfl: 0   metoprolol tartrate (LOPRESSOR) 25 MG tablet, Take 0.5 tablets (12.5 mg total) by mouth 2 (two) times daily. (Patient not taking: Reported on 08/29/2021), Disp: 30 tablet, Rfl: 0   naloxone (NARCAN) nasal spray 4 mg/0.1 mL, Prn narcotic overdose (Patient not taking: Reported on 08/29/2021), Disp: 1 each, Rfl: 0   nitroGLYCERIN (NITROSTAT) 0.4 MG SL tablet, Place 1 tablet (0.4 mg total) under the tongue every 5 (five) minutes x 3 doses as needed for chest pain. Do not take more than 3 doses in a day.  (Patient not taking: Reported on 08/29/2021), Disp: 30 tablet, Rfl: 0   ranolazine (RANEXA) 500 MG 12 hr tablet, Take 1 tablet (500 mg total) by mouth 2 (two) times daily. (Patient not taking: Reported on 08/29/2021), Disp: 60 tablet, Rfl: 0   rosuvastatin (CRESTOR) 20 MG tablet, Take 20 mg by mouth daily. (Patient not taking: Reported on 08/29/2021), Disp: , Rfl:   Allergies  Allergen Reactions   Alendronate Nausea Only    I personally reviewed active problem list, medication list, allergies, family history, social history, health maintenance with the patient/caregiver today.   ROS  Constitutional: Negative for fever or weight change.  Respiratory: positive  for cough and shortness of breath ( back to baseline) .  Cardiovascular: Negative for chest pain or palpitations.  Gastrointestinal: Negative for abdominal pain, no bowel changes.  Musculoskeletal: Positive for gait problem but no joint swelling.  Skin: lesion top of scalp, cancer skin removal Neurological: positive  for orthostatic dizziness but no headache.  No other specific complaints in a complete review of systems (except as listed in HPI above).   Objective  Vitals:   08/29/21 1015  BP: 110/64  Pulse: 87  Resp: 16  Temp: 97.9 F (36.6 C)  SpO2: 97%  Weight: 102 lb (46.3 kg)  Height: 5' (1.524 m)    Body mass index is 19.92 kg/m.  Physical Exam  Constitutional: Patient appears thin, mild respiratory distress and difficulty hearing me ( forgot hearing aids)  HEENT: head atraumatic, normocephalic, pupils equal and reactive to light Cardiovascular: Normal rate, regular rhythm and normal heart sounds.  No murmur heard. No BLE edema. Pulmonary/Chest: Effort normal and breath sounds normal. No respiratory distress. Abdominal: Soft.  There is no tenderness. Psychiatric: Patient has a normal mood and affect. behavior is normal. Judgment and thought content normal.   Recent Results (from the past 2160 hour(s))   Basic metabolic panel     Status: Abnormal   Collection Time: 08/18/21  4:27 PM  Result Value Ref Range   Sodium 140 135 - 145 mmol/L   Potassium 3.9 3.5 - 5.1 mmol/L   Chloride 105 98 - 111 mmol/L   CO2 25 22 - 32 mmol/L   Glucose, Bld 117 (H) 70 - 99 mg/dL    Comment: Glucose reference range applies only to samples taken after fasting for at least 8 hours.   BUN 27 (H) 8 - 23 mg/dL   Creatinine, Ser 0.92 0.44 - 1.00 mg/dL   Calcium 9.4 8.9 - 10.3 mg/dL   GFR, Estimated 60 (L) >60 mL/min    Comment: (NOTE) Calculated using the CKD-EPI Creatinine Equation (2021)    Anion gap 10 5 - 15    Comment: Performed at [facility], [address] [place], [place] [address]  CBC     Status: Abnormal   Collection Time: 08/18/21  4:27 PM  Result Value Ref Range   WBC 7.7 4.0 - 10.5 K/uL   RBC 4.13 3.87 - 5.11 MIL/uL   Hemoglobin 14.0 12.0 - 15.0 g/dL   HCT 41.9 36.0 - 46.0 %   MCV 101.5 (H) 80.0 - 100.0 fL   MCH 33.9 26.0 - 34.0 pg   MCHC 33.4 30.0 - 36.0 g/dL   RDW 13.5 11.5 - 15.5 %   Platelets 143 (L) 150 - 400 K/uL   nRBC 0.0 0.0 - 0.2 %    Comment: Performed at [facility], [place], [place] [address]  Troponin I (High Sensitivity)     Status: Abnormal   Collection Time: 08/18/21  4:27 PM  Result Value Ref Range   Troponin I (High Sensitivity) 144 (HH) <18 ng/L    Comment: CRITICAL RESULT CALLED TO, READ BACK BY AND VERIFIED WITH JANE RYAN @1707  08/18/21 MJU (NOTE) Elevated high sensitivity troponin I (hsTnI) values and significant  changes across serial measurements may suggest ACS but many other  chronic and acute conditions are known to elevate hsTnI results.  Refer to the "Links" section for chest pain algorithms and additional  guidance. Performed at [facility], [address] [place], [place] [address]   Resp Panel by RT-PCR (Flu A&B, Covid) Nasopharyngeal Swab     Status: None  Collection Time: 08/18/21  5:44 PM    Specimen: Nasopharyngeal Swab; Nasopharyngeal(NP) swabs in vial transport medium  Result Value Ref Range   SARS Coronavirus 2 by RT PCR NEGATIVE NEGATIVE    Comment: (NOTE) SARS-CoV-2 target nucleic acids are NOT DETECTED.  The SARS-CoV-2 RNA is generally detectable in upper respiratory specimens during the acute phase of infection. The lowest concentration of SARS-CoV-2 viral copies this assay can detect is 138 copies/mL. A negative result does not preclude SARS-Cov-2 infection and should not be used as the sole basis for treatment or other patient management decisions. A negative result may occur with  improper specimen collection/handling, submission of specimen other than nasopharyngeal swab, presence of viral mutation(s) within the areas targeted by this assay, and inadequate number of viral copies(<138 copies/mL). A negative result must be combined with clinical observations, patient history, and epidemiological information. The expected result is Negative.  Fact Sheet for Patients:  EntrepreneurPulse.com.au  Fact Sheet for Healthcare Providers:  IncredibleEmployment.be  This test is no t yet approved or cleared by the [place] FDA and  has been authorized for detection and/or diagnosis of SARS-CoV-2 by FDA under an Emergency Use Authorization (EUA). This EUA will remain  in effect (meaning this test can be used) for the duration of the COVID-19 declaration under Section 564(b)(1) of the Act, 21 U.S.C.section 360bbb-3(b)(1), unless the authorization is terminated  or revoked sooner.       Influenza A by PCR NEGATIVE NEGATIVE   Influenza B by PCR NEGATIVE NEGATIVE    Comment: (NOTE) The Xpert Xpress SARS-CoV-2/FLU/RSV plus assay is intended as an aid in the diagnosis of influenza from Nasopharyngeal swab specimens and should not be used as a sole basis for treatment. Nasal washings and aspirates are unacceptable for Xpert Xpress  SARS-CoV-2/FLU/RSV testing.  Fact Sheet for Patients: EntrepreneurPulse.com.au  Fact Sheet for Healthcare Providers: IncredibleEmployment.be  This test is not yet approved or cleared by the [place] FDA and has been authorized for detection and/or diagnosis of SARS-CoV-2 by FDA under an Emergency Use Authorization (EUA). This EUA will remain in effect (meaning this test can be used) for the duration of the COVID-19 declaration under Section 564(b)(1) of the Act, 21 U.S.C. section 360bbb-3(b)(1), unless the authorization is terminated or revoked.  Performed at [facility], [place], [place] [address]   Troponin I (High Sensitivity)     Status: Abnormal   Collection Time: 08/18/21  6:27 PM  Result Value Ref Range   Troponin I (High Sensitivity) 168 (HH) <18 ng/L    Comment: CRITICAL VALUE NOTED. VALUE IS CONSISTENT WITH PREVIOUSLY REPORTED/CALLED VALUE SKL (NOTE) Elevated high sensitivity troponin I (hsTnI) values and significant  changes across serial measurements may suggest ACS but many other  chronic and acute conditions are known to elevate hsTnI results.  Refer to the "Links" section for chest pain algorithms and additional  guidance. Performed at [facility], [address] [place], [place] [address]   APTT     Status: None   Collection Time: 08/18/21  7:00 PM  Result Value Ref Range   aPTT 28 24 - 36 seconds    Comment: Performed at [facility], [address] [place], [place] [address]  Protime-INR     Status: None   Collection Time: 08/18/21  7:00 PM  Result Value Ref Range   Prothrombin Time 14.4 11.4 - 15.2 seconds   INR 1.1 0.8 - 1.2    Comment: (NOTE) INR  goal varies based on device and disease states. Performed at [facility], [place], [place] [address]   Heparin level (unfractionated)     Status: None   Collection Time: 08/19/21  4:01 AM   Result Value Ref Range   Heparin Unfractionated 0.56 0.30 - 0.70 IU/mL    Comment: (NOTE) The clinical reportable range upper limit is being lowered to >1.10 to align with the FDA approved guidance for the current laboratory assay.  If heparin results are below expected values, and patient dosage has  been confirmed, suggest follow up testing of antithrombin III levels. Performed at [facility], [address] [place], [place] [address]   Basic metabolic panel     Status: Abnormal   Collection Time: 08/19/21  6:44 AM  Result Value Ref Range   Sodium 140 135 - 145 mmol/L   Potassium 4.1 3.5 - 5.1 mmol/L   Chloride 106 98 - 111 mmol/L   CO2 27 22 - 32 mmol/L   Glucose, Bld 117 (H) 70 - 99 mg/dL    Comment: Glucose reference range applies only to samples taken after fasting for at least 8 hours.   BUN 27 (H) 8 - 23 mg/dL   Creatinine, Ser 0.78 0.44 - 1.00 mg/dL   Calcium 8.9 8.9 - 10.3 mg/dL   GFR, Estimated >60 >60 mL/min    Comment: (NOTE) Calculated using the CKD-EPI Creatinine Equation (2021)    Anion gap 7 5 - 15    Comment: Performed at [facility], [address] [place], [place] [address]  Lipid panel     Status: None   Collection Time: 08/19/21  6:44 AM  Result Value Ref Range   Cholesterol 113 0 - 200 mg/dL   Triglycerides 36 <150 mg/dL   HDL 60 >40 mg/dL   Total CHOL/HDL Ratio 1.9 RATIO   VLDL 7 0 - 40 mg/dL   LDL Cholesterol 46 0 - 99 mg/dL    Comment:        Total Cholesterol/HDL:CHD Risk Coronary Heart Disease Risk Table                     Men   Women  1/2 Average Risk   3.4   3.3  Average Risk       5.0   4.4  2 X Average Risk   9.6   7.1  3 X Average Risk  23.4   11.0        Use the calculated Patient Ratio above and the CHD Risk Table to determine the patient's CHD Risk.        ATP III CLASSIFICATION (LDL):  <100     mg/dL   Optimal  100-129  mg/dL   Near or Above                    Optimal  130-159  mg/dL   Borderline   160-189  mg/dL   High  >190     mg/dL   Very High Performed at [facility], [address] [place], [place] [address]   CBC     Status: Abnormal   Collection Time: 08/19/21  6:44 AM  Result Value Ref Range   WBC 8.2 4.0 - 10.5 K/uL   RBC 3.69 (L) 3.87 - 5.11 MIL/uL   Hemoglobin 12.6 12.0 - 15.0 g/dL   HCT 36.8 36.0 - 46.0 %   MCV 99.7 80.0 - 100.0 fL  MCH 34.1 (H) 26.0 - 34.0 pg   MCHC 34.2 30.0 - 36.0 g/dL   RDW 13.6 11.5 - 15.5 %   Platelets 134 (L) 150 - 400 K/uL   nRBC 0.0 0.0 - 0.2 %    Comment: Performed at [facility], [address] [place], [place] [address]  Glucose, capillary     Status: Abnormal   Collection Time: 08/19/21  9:17 AM  Result Value Ref Range   Glucose-Capillary 112 (H) 70 - 99 mg/dL    Comment: Glucose reference range applies only to samples taken after fasting for at least 8 hours.  MRSA Next Gen by PCR, Nasal     Status: None   Collection Time: 08/19/21  9:25 AM   Specimen: Nasal Mucosa; Nasal Swab  Result Value Ref Range   MRSA by PCR Next Gen NOT DETECTED NOT DETECTED    Comment: (NOTE) The GeneXpert MRSA Assay (FDA approved for NASAL specimens only), is one component of a comprehensive MRSA colonization surveillance program. It is not intended to diagnose MRSA infection nor to guide or monitor treatment for MRSA infections. Test performance is not FDA approved in patients less than 43 years old. Performed at [facility], [address] [place], [place] [address]   ECHOCARDIOGRAM COMPLETE     Status: None   Collection Time: 08/19/21 11:46 AM  Result Value Ref Range   Weight 1,767.21 oz   BP 153/110 mmHg   Ao pk vel 2.30 m/s   AV Area VTI 1.65 cm2   AR max vel 1.37 cm2   AV Mean grad 12.0 mmHg   AV Peak grad 21.2 mmHg   Single Plane A2C EF 45.7 %   Single Plane A4C EF 19.7 %   Calc EF 37.3 %   S' Lateral 2.50 cm   AV Area mean vel 1.38 cm2   Area-P 1/2 3.77 cm2   MV VTI 1.73 cm2  Heparin level  (unfractionated)     Status: None   Collection Time: 08/19/21 12:27 PM  Result Value Ref Range   Heparin Unfractionated 0.37 0.30 - 0.70 IU/mL    Comment: (NOTE) The clinical reportable range upper limit is being lowered to >1.10 to align with the FDA approved guidance for the current laboratory assay.  If heparin results are below expected values, and patient dosage has  been confirmed, suggest follow up testing of antithrombin III levels. Performed at [facility], [address] [place], [place] [address]   Basic metabolic panel     Status: Abnormal   Collection Time: 08/20/21  5:35 AM  Result Value Ref Range   Sodium 140 135 - 145 mmol/L   Potassium 4.0 3.5 - 5.1 mmol/L   Chloride 106 98 - 111 mmol/L   CO2 28 22 - 32 mmol/L   Glucose, Bld 117 (H) 70 - 99 mg/dL    Comment: Glucose reference range applies only to samples taken after fasting for at least 8 hours.   BUN 23 8 - 23 mg/dL   Creatinine, Ser 0.87 0.44 - 1.00 mg/dL   Calcium 9.0 8.9 - 10.3 mg/dL   GFR, Estimated >60 >60 mL/min    Comment: (NOTE) Calculated using the CKD-EPI Creatinine Equation (2021)    Anion gap 6 5 - 15    Comment: Performed at [facility], [address] [place], [place] [address]  Heparin level (unfractionated)     Status: None   Collection Time: 08/20/21  5:35 AM  Result Value Ref Range   Heparin Unfractionated 0.54 0.30 - 0.70 IU/mL    Comment: (NOTE) The clinical reportable range upper limit is being lowered to >1.10 to align with the FDA approved guidance for the current laboratory assay.  If heparin results are below expected values, and patient dosage has  been confirmed, suggest follow up testing of antithrombin III levels. Performed at [facility], [address] [place], [place] [address]   CBC     Status: Abnormal   Collection Time: 08/20/21  5:35 AM  Result Value Ref Range   WBC 7.3 4.0 - 10.5 K/uL   RBC 3.70 (L) 3.87 - 5.11 MIL/uL    Hemoglobin 12.0 12.0 - 15.0 g/dL   HCT 36.9 36.0 - 46.0 %   MCV 99.7 80.0 - 100.0 fL   MCH 32.4 26.0 - 34.0 pg   MCHC 32.5 30.0 - 36.0 g/dL   RDW 13.5 11.5 - 15.5 %   Platelets 143 (L) 150 - 400 K/uL   nRBC 0.0 0.0 - 0.2 %    Comment: Performed at [facility], [address] [place], [place] [address]  Basic metabolic panel     Status: Abnormal   Collection Time: 08/21/21  5:55 AM  Result Value Ref Range   Sodium 138 135 - 145 mmol/L   Potassium 3.9 3.5 - 5.1 mmol/L   Chloride 107 98 - 111 mmol/L   CO2 28 22 - 32 mmol/L   Glucose, Bld 103 (H) 70 - 99 mg/dL    Comment: Glucose reference range applies only to samples taken after fasting for at least 8 hours.   BUN 17 8 - 23 mg/dL   Creatinine, Ser 0.84 0.44 - 1.00 mg/dL   Calcium 8.7 (L) 8.9 - 10.3 mg/dL   GFR, Estimated >60 >60 mL/min    Comment: (NOTE) Calculated using the CKD-EPI Creatinine Equation (2021)    Anion gap 3 (L) 5 - 15    Comment: Performed at [facility], [address] [place], [place] [address]  Basic metabolic panel     Status: Abnormal   Collection Time: 08/22/21  4:55 AM  Result Value Ref Range   Sodium 138 135 - 145 mmol/L   Potassium 3.9 3.5 - 5.1 mmol/L   Chloride 106 98 - 111 mmol/L   CO2 24 22 - 32 mmol/L   Glucose, Bld 128 (H) 70 - 99 mg/dL    Comment: Glucose reference range applies only to samples taken after fasting for at least 8 hours.   BUN 17 8 - 23 mg/dL   Creatinine, Ser 0.80 0.44 - 1.00 mg/dL   Calcium 9.0 8.9 - 10.3 mg/dL   GFR, Estimated >60 >60 mL/min    Comment: (NOTE) Calculated using the CKD-EPI Creatinine Equation (2021)    Anion gap 8 5 - 15    Comment: Performed at [facility], [address] [place], [place] [address]  Basic metabolic panel     Status: Abnormal   Collection Time: 08/23/21  5:00 AM  Result Value Ref Range   Sodium 137 135 - 145 mmol/L   Potassium 4.3 3.5 - 5.1 mmol/L   Chloride 105 98 - 111 mmol/L   CO2 25 22 - 32  mmol/L   Glucose, Bld 132 (H) 70 - 99 mg/dL    Comment: Glucose reference range applies only to samples taken after fasting for at least 8 hours.   BUN 16 8 - 23 mg/dL  Creatinine, Ser 0.86 0.44 - 1.00 mg/dL   Calcium 8.8 (L) 8.9 - 10.3 mg/dL   GFR, Estimated >60 >60 mL/min    Comment: (NOTE) Calculated using the CKD-EPI Creatinine Equation (2021)    Anion gap 7 5 - 15    Comment: Performed at [facility], [address] [place], [place] [address]     PHQ2/9: Depression screen [facility] 2/9 08/29/2021 06/01/2021 03/02/2021 02/10/2021 11/30/2020  Decreased Interest 0 0 0 0 0  Down, Depressed, Hopeless 0 0 0 0 0  PHQ - 2 Score 0 0 0 0 0  Altered sleeping 0 - - - -  Tired, decreased energy 0 - - - -  Change in appetite 0 - - - -  Feeling bad or failure about yourself  0 - - - -  Trouble concentrating 0 - - - -  Moving slowly or fidgety/restless 0 - - - -  Suicidal thoughts 0 - - - -  PHQ-9 Score 0 - - - -  Difficult doing work/chores - - - - -  Some recent data might be hidden    phq 9 is negative   Fall Risk: Fall Risk  08/29/2021 06/01/2021 03/02/2021 02/10/2021 11/30/2020  Falls in the past year? 0 0 0 0 0  Number falls in past yr: 0 0 0 0 0  Injury with Fall? 0 0 0 0 0  Comment - - - - -  Risk Factor Category  - - - - -  Risk for fall due to : Impaired mobility - - No Fall Risks -  Risk for fall due to: Comment - - - - -  Follow up Falls prevention discussed Falls evaluation completed - Falls prevention discussed -      Functional Status Survey: Is the patient deaf or have difficulty hearing?: Yes Does the patient have difficulty seeing, even when wearing glasses/contacts?: No Does the patient have difficulty concentrating, remembering, or making decisions?: No Does the patient have difficulty walking or climbing stairs?: Yes Does the patient have difficulty dressing or bathing?: No Does the patient have difficulty doing errands alone such as visiting a doctor's  office or shopping?: Yes    Assessment & Plan  1. Hospital discharge follow-up  - Ambulatory referral to [place]  2. NSTEMI (non-ST elevated myocardial infarction) [facility])  - Ambulatory referral to Home Health  3. Physical deconditioning  - Ambulatory referral to Home Health  4. Thrombocytopenia ([place])  Recheck next visit, it was stable during hospital stay   5. CHF (congestive heart failure), NYHA class IV, acute, diastolic ([place])  - Ambulatory referral to [place]

## 2021-08-31 ENCOUNTER — Encounter: Payer: Self-pay | Admitting: Family Medicine

## 2021-08-31 ENCOUNTER — Telehealth (INDEPENDENT_AMBULATORY_CARE_PROVIDER_SITE_OTHER): Payer: Medicare Other | Admitting: Family Medicine

## 2021-08-31 DIAGNOSIS — M19042 Primary osteoarthritis, left hand: Secondary | ICD-10-CM

## 2021-08-31 DIAGNOSIS — J431 Panlobular emphysema: Secondary | ICD-10-CM

## 2021-08-31 DIAGNOSIS — M19041 Primary osteoarthritis, right hand: Secondary | ICD-10-CM | POA: Diagnosis not present

## 2021-08-31 DIAGNOSIS — J439 Emphysema, unspecified: Secondary | ICD-10-CM

## 2021-08-31 DIAGNOSIS — F1190 Opioid use, unspecified, uncomplicated: Secondary | ICD-10-CM | POA: Diagnosis not present

## 2021-08-31 DIAGNOSIS — G4736 Sleep related hypoventilation in conditions classified elsewhere: Secondary | ICD-10-CM | POA: Diagnosis not present

## 2021-08-31 DIAGNOSIS — M5134 Other intervertebral disc degeneration, thoracic region: Secondary | ICD-10-CM | POA: Diagnosis not present

## 2021-08-31 DIAGNOSIS — G894 Chronic pain syndrome: Secondary | ICD-10-CM | POA: Diagnosis not present

## 2021-08-31 MED ORDER — ALBUTEROL SULFATE HFA 108 (90 BASE) MCG/ACT IN AERS: INHALATION_SPRAY | RESPIRATORY_TRACT | 3 refills | Status: AC

## 2021-08-31 MED ORDER — UMECLIDINIUM-VILANTEROL 62.5-25 MCG/ACT IN AEPB: 1.0000 | INHALATION_SPRAY | Freq: Every day | RESPIRATORY_TRACT | 2 refills | Status: DC

## 2021-08-31 MED ORDER — HYDROCODONE-ACETAMINOPHEN 5-325 MG PO TABS: 1.0000 | ORAL_TABLET | Freq: Three times a day (TID) | ORAL | 0 refills | Status: DC | PRN

## 2021-08-31 NOTE — Progress Notes (Signed)
Name: Samantha Dawson   MRN: [id number]    DOB: April 23, 1929   Date:08/31/2021       Progress Note  Subjective  Chief Complaint  Follow Up  I connected with  Samantha Dawson  on 08/31/21 at  3:20 PM EDT by a video enabled telemedicine application and verified that I am speaking with the correct person using two identifiers.  I discussed the limitations of evaluation and management by telemedicine and the availability of in person appointments. The patient expressed understanding and agreed to proceed with the virtual visit  Staff also discussed with the patient that there may be a patient responsible charge related to this service. Patient Location: home  Provider Location: [facility] Additional Individuals present: alone   HPI  Chronic pain from arthritis also DDD thoracic spine:  Long history of pain  previously prescribed by Dr. Sanda Klein at 10-325 - she was taking 105 tablets per month and we gradually went down to 60 pills for month but the pain intensified and she was hurting all over. She is now taking 90 per month. Contract agreement resigned  11/2020 , drug screen reviewed done 2022 and was positive for opioids and negative for other drugs. Reviewed drug data base today.   She was given Gabapentin to take at night but she never titrated dose up and stopped taking because it did not help much and caused sedation during the day.  She states afraid of seeing pain clinic because of lack of transportation, we will referred her to pain clinic and tried to arrange for transportation but she states she cannot hear well and does not want to go. Explained that since she had a recent hospitalization we will keep her at 90 pills per month, but goal is to try to go down to 80 per month. Her current pain level is 4-5/10. She denies constipation or side effects of medication  Emphysema with nocturnal hypoxia: currently using oxygen all day due to recent MI, she is out of Anoro and Albutero and would like a refill.  She states no cough, but SOB has been worse since MI but stable since her visit with me two days ago   Patient Active Problem List   Diagnosis Date Noted   CHF (congestive heart failure), NYHA class IV, acute, diastolic ([place]) 18/56/3149   Thrombocytopenia ([place]) 70/26/3785   Acute systolic CHF (congestive heart failure) ([place]) 08/23/2021   Hypertensive emergency 08/23/2021   NSTEMI (non-ST elevated myocardial infarction) ([place]) 08/18/2021   History of nonmelanoma skin cancer 06/06/2020   Senile purpura ([place]) 05/04/2020   Nocturnal hypoxemia due to emphysema ([place]) 05/04/2020   Carotid atherosclerosis, bilateral 08/27/2018   Bilateral carotid bruits 08/27/2018   CKD (chronic kidney disease) stage 3, GFR 30-59 ml/min (HCC) 08/27/2018   Age-related osteoporosis without current pathological fracture 08/11/2018   Shoulder pain, left 08/29/2017   Acid reflux 88/50/2774   Uncomplicated opioid use 12/87/8676   Hyperglycemia 09/02/2016   Left thyroid nodule 07/13/2016   Abnormal bone scan of cervical spine 06/18/2016   Primary osteoarthritis of both hands 05/03/2016   Elevated serum alkaline phosphatase level 05/03/2016   Hyperkalemia 05/03/2016   Degenerative disc disease, thoracic 05/03/2016   Hx of smoking 04/17/2016   Controlled substance agreement signed 03/22/2016   Back pain 03/22/2016   Hyperlipidemia 10/25/2015   Essential hypertension 10/25/2015   Panlobular emphysema ([place]) 10/25/2015   Chronic pain 05/25/2015   Actinic keratosis 05/25/2015    Past Surgical History:  Procedure Laterality  Date   ABDOMINAL HYSTERECTOMY     CATARACT EXTRACTION     endaryerectomy     renal stenting      Family History  Problem Relation Age of Onset   Cancer Mother    Coronary artery disease Father    Cerebrovascular Accident Father    Cancer Sister        breast cancer   Cancer Brother        stomach   Heart disease Daughter        heart vavle bypass?   Stroke Sister    Lung disease  Brother    Cancer Brother        unknown   Arthritis Daughter     Social History   Socioeconomic History   Marital status: Widowed    Spouse name: Ihor Gully   Number of children: 3   Years of education: Not on file   Highest education level: 10th grade  Occupational History   Occupation: Retired  Tobacco Use   Smoking status: Former    Packs/day: 1.00    Years: 50.00    Pack years: 50.00    Types: Cigarettes    Quit date: 2017    Years since quitting: 5.8    Passive exposure: Never   Smokeless tobacco: Never   Tobacco comments:    smoking cesssation materials not required  Vaping Use   Vaping Use: Never used  Substance and Sexual Activity   Alcohol use: No    Alcohol/week: 0.0 standard drinks   Drug use: No   Sexual activity: Not Currently    Birth control/protection: None  Other Topics Concern   Not on file  Social History Narrative   Pt lives alone   Social Determinants of Health   Financial Resource Strain: Low Risk    Difficulty of Paying Living Expenses: Not hard at all  Food Insecurity: No Food Insecurity   Worried About Charity fundraiser in the Last Year: Never true   [place] in the Last Year: Never true  Transportation Needs: No Transportation Needs   Lack of Transportation (Medical): No   Lack of Transportation (Non-Medical): No  Physical Activity: Inactive   Days of Exercise per Week: 0 days   Minutes of Exercise per Session: 0 min  Stress: No Stress Concern Present   Feeling of Stress : Only a little  Social Connections: Moderately Isolated   Frequency of Communication with Friends and Family: More than three times a week   Frequency of Social Gatherings with Friends and Family: More than three times a week   Attends Religious Services: More than 4 times per year   Active Member of [employer] or Organizations: No   Attends Archivist Meetings: Never   Marital Status: Widowed  Human resources officer Violence: Not At Risk   Fear of Current  or Ex-Partner: No   Emotionally Abused: No   Physically Abused: No   Sexually Abused: No     Current Outpatient Medications:    aspirin 81 MG tablet, Take 81 mg by mouth daily., Disp: , Rfl:    HYDROcodone-acetaminophen (NORCO/VICODIN) 5-325 MG tablet, Take 1 tablet by mouth 3 (three) times daily as needed for moderate pain. Fill September 07/31/2021, Disp: 90 tablet, Rfl: 0   HYDROcodone-acetaminophen (NORCO/VICODIN) 5-325 MG tablet, Take 1 tablet by mouth 3 (three) times daily as needed for moderate pain. To last one month, Disp: 90 tablet, Rfl: 0   HYDROcodone-acetaminophen (NORCO/VICODIN) 5-325 MG tablet,  Take 1 tablet by mouth 3 (three) times daily as needed for moderate pain. To last one month, Disp: 90 tablet, Rfl: 0   pantoprazole (PROTONIX) 20 MG tablet, Take 1 tablet (20 mg total) by mouth 2 (two) times daily. Caution:prolonged use may increase risk of pneumonia, colitis, osteoporosis, anemia.  Skip doses if you can, Disp: 180 tablet, Rfl: 1   ticagrelor (BRILINTA) 90 MG TABS tablet, Take 1 tablet (90 mg total) by mouth 2 (two) times daily., Disp: 60 tablet, Rfl: 0   albuterol (PROAIR HFA) 108 (90 Base) MCG/ACT inhaler, INHALE 2 PUFFS INTO THE LUNGS EVERY 4 HOURS AS NEEDED FOR WHEEZING OR SHORTNESS OF BREATH; FUTURE REFILLS FROM LUNG DOCTOR (Patient not taking: No sig reported), Disp: 8.5 g, Rfl: 3   ANORO ELLIPTA 62.5-25 MCG/INH AEPB, INHALE 1 PUFF INTO THE LUNGS DAILY (Patient not taking: No sig reported), Disp: 60 each, Rfl: 0   isosorbide mononitrate (IMDUR) 30 MG 24 hr tablet, Take 1 tablet (30 mg total) by mouth daily. (Patient not taking: No sig reported), Disp: 30 tablet, Rfl: 0   metoprolol tartrate (LOPRESSOR) 25 MG tablet, Take 0.5 tablets (12.5 mg total) by mouth 2 (two) times daily. (Patient not taking: No sig reported), Disp: 30 tablet, Rfl: 0   naloxone (NARCAN) nasal spray 4 mg/0.1 mL, Prn narcotic overdose (Patient not taking: No sig reported), Disp: 1 each, Rfl: 0    nitroGLYCERIN (NITROSTAT) 0.4 MG SL tablet, Place 1 tablet (0.4 mg total) under the tongue every 5 (five) minutes x 3 doses as needed for chest pain. Do not take more than 3 doses in a day. (Patient not taking: No sig reported), Disp: 30 tablet, Rfl: 0   ranolazine (RANEXA) 500 MG 12 hr tablet, Take 1 tablet (500 mg total) by mouth 2 (two) times daily. (Patient not taking: No sig reported), Disp: 60 tablet, Rfl: 0   rosuvastatin (CRESTOR) 20 MG tablet, Take 20 mg by mouth daily. (Patient not taking: No sig reported), Disp: , Rfl:   Allergies  Allergen Reactions   Alendronate Nausea Only    I personally reviewed active problem list, medication list, allergies, family history, social history, health maintenance with the patient/caregiver today.   ROS  Ten systems reviewed and is negative except as mentioned in HPI   Objective  Virtual encounter, vitals not obtained.  There is no height or weight on file to calculate BMI.  Physical Exam  Awake, alert and oriented   PHQ2/9: Depression screen [facility] 2/9 08/31/2021 08/29/2021 06/01/2021 03/02/2021 02/10/2021  Decreased Interest 0 0 0 0 0  Down, Depressed, Hopeless 0 0 0 0 0  PHQ - 2 Score 0 0 0 0 0  Altered sleeping 0 0 - - -  Tired, decreased energy 0 0 - - -  Change in appetite 0 0 - - -  Feeling bad or failure about yourself  0 0 - - -  Trouble concentrating 0 0 - - -  Moving slowly or fidgety/restless 0 0 - - -  Suicidal thoughts 0 0 - - -  PHQ-9 Score 0 0 - - -  Difficult doing work/chores - - - - -  Some recent data might be hidden   PHQ-2/9 Result is negative.    Fall Risk: Fall Risk  08/31/2021 08/29/2021 06/01/2021 03/02/2021 02/10/2021  Falls in the past year? 0 0 0 0 0  Number falls in past yr: 0 0 0 0 0  Injury with Fall? 0 0 0 0 0  Comment - - - - -  Risk Factor Category  - - - - -  Risk for fall due to : Impaired mobility Impaired mobility - - No Fall Risks  Risk for fall due to: Comment - - - - -  Follow up Falls  prevention discussed Falls prevention discussed Falls evaluation completed - Falls prevention discussed     Assessment & Plan  1. Chronic pain syndrome  - HYDROcodone-acetaminophen (NORCO/VICODIN) 5-325 MG tablet; Take 1 tablet by mouth 3 (three) times daily as needed for moderate pain. Fill September 07/31/2021  Dispense: 90 tablet; Refill: 0 - HYDROcodone-acetaminophen (NORCO/VICODIN) 5-325 MG tablet; Take 1 tablet by mouth 3 (three) times daily as needed for moderate pain. To last one month  Dispense: 90 tablet; Refill: 0 - HYDROcodone-acetaminophen (NORCO/VICODIN) 5-325 MG tablet; Take 1 tablet by mouth 3 (three) times daily as needed for moderate pain. To last one month  Dispense: 90 tablet; Refill: 0  2. Primary osteoarthritis of both hands  - HYDROcodone-acetaminophen (NORCO/VICODIN) 5-325 MG tablet; Take 1 tablet by mouth 3 (three) times daily as needed for moderate pain. Fill September 07/31/2021  Dispense: 90 tablet; Refill: 0 - HYDROcodone-acetaminophen (NORCO/VICODIN) 5-325 MG tablet; Take 1 tablet by mouth 3 (three) times daily as needed for moderate pain. To last one month  Dispense: 90 tablet; Refill: 0 - HYDROcodone-acetaminophen (NORCO/VICODIN) 5-325 MG tablet; Take 1 tablet by mouth 3 (three) times daily as needed for moderate pain. To last one month  Dispense: 90 tablet; Refill: 0  3. Degenerative disc disease, thoracic  - HYDROcodone-acetaminophen (NORCO/VICODIN) 5-325 MG tablet; Take 1 tablet by mouth 3 (three) times daily as needed for moderate pain. Fill September 07/31/2021  Dispense: 90 tablet; Refill: 0 - HYDROcodone-acetaminophen (NORCO/VICODIN) 5-325 MG tablet; Take 1 tablet by mouth 3 (three) times daily as needed for moderate pain. To last one month  Dispense: 90 tablet; Refill: 0 - HYDROcodone-acetaminophen (NORCO/VICODIN) 5-325 MG tablet; Take 1 tablet by mouth 3 (three) times daily as needed for moderate pain. To last one month  Dispense: 90 tablet; Refill:  0  4. Uncomplicated opioid use  - HYDROcodone-acetaminophen (NORCO/VICODIN) 5-325 MG tablet; Take 1 tablet by mouth 3 (three) times daily as needed for moderate pain. Fill September 07/31/2021  Dispense: 90 tablet; Refill: 0 - HYDROcodone-acetaminophen (NORCO/VICODIN) 5-325 MG tablet; Take 1 tablet by mouth 3 (three) times daily as needed for moderate pain. To last one month  Dispense: 90 tablet; Refill: 0 - HYDROcodone-acetaminophen (NORCO/VICODIN) 5-325 MG tablet; Take 1 tablet by mouth 3 (three) times daily as needed for moderate pain. To last one month  Dispense: 90 tablet; Refill: 0  5. Panlobular emphysema (HCC)  - albuterol (PROAIR HFA) 108 (90 Base) MCG/ACT inhaler; INHALE 2 PUFFS INTO THE LUNGS EVERY 4 HOURS AS NEEDED FOR WHEEZING OR SHORTNESS OF BREATH; FUTURE REFILLS FROM LUNG DOCTOR  Dispense: 8.5 g; Refill: 3  6. Nocturnal hypoxemia due to emphysema (HCC)  - albuterol (PROAIR HFA) 108 (90 Base) MCG/ACT inhaler; INHALE 2 PUFFS INTO THE LUNGS EVERY 4 HOURS AS NEEDED FOR WHEEZING OR SHORTNESS OF BREATH; FUTURE REFILLS FROM LUNG DOCTOR  Dispense: 8.5 g; Refill: 3 - umeclidinium-vilanterol (ANORO ELLIPTA) 62.5-25 MCG/ACT AEPB; Inhale 1 puff into the lungs daily at 6 (six) AM.  Dispense: 1 each; Refill: 2   I discussed the assessment and treatment plan with the patient. The patient was provided an opportunity to ask questions and all were answered. The patient agreed with the plan and demonstrated an understanding  of the instructions.  The patient was advised to call back or seek an in-person evaluation if the symptoms worsen or if the condition fails to improve as anticipated.  I provided 25  minutes of non-face-to-face time during this encounter.

## 2021-09-08 ENCOUNTER — Ambulatory Visit: Payer: Self-pay | Admitting: *Deleted

## 2021-09-08 ENCOUNTER — Ambulatory Visit: Payer: Self-pay

## 2021-09-08 ENCOUNTER — Ambulatory Visit: Payer: Medicare Other | Admitting: Dermatology

## 2021-09-08 NOTE — Telephone Encounter (Signed)
Pt daughter called in stating that pt has been having SOB and difficulty breathing for 2 days due to her maintenance inhaler being dc'd. Said she went to the pharmacy to get a refill but the only thing that they gave her was a rescue inhaler. Pt is on cont O2 at 2L. Pt is very SOB and has to stop and take breaths in between words when talking. Had to receive permission from pt to speak with daughter [name] due to her not being on DPR form. Attempted to schedule an appt via telephone or OV but pt requests [place] specifically and nothing was available until 09/14/21 @ 1140. Appt was scheduled so pt can follow up with Dr regarding inhaler refill and other blood work, home health that was discussed at previous appt. Advised that pt would need to be in seen in ED today for SOB since she's been experiencing it for 2 days. Daughter verbalized understanding of ER visit for today but was unsure if pt would actually go. Wanted to see if inhaler could just be called in. Advised that this note will be sent to office. Asked which ED pt will go to and daughter states [facility]. Care advice given.    Reason for Disposition  [1] MODERATE difficulty breathing (e.g., speaks in phrases, SOB even at rest, pulse 100-120) AND [2] NEW-onset or WORSE than normal  Answer Assessment - Initial Assessment Questions 1. RESPIRATORY STATUS: "Describe your breathing?" (e.g., wheezing, shortness of breath, unable to speak, severe coughing)      SOB, having to take breaths in between speaking  2. ONSET: "When did this breathing problem begin?"      2 days ago  3. PATTERN "Does the difficult breathing come and go, or has it been constant since it started?"      Constant  4. SEVERITY: "How bad is your breathing?" (e.g., mild, moderate, severe)    - MILD: No SOB at rest, mild SOB with walking, speaks normally in sentences, can lie down, no retractions, pulse < 100.    - MODERATE: SOB at rest, SOB with minimal exertion and prefers to sit, cannot  lie down flat, speaks in phrases, mild retractions, audible wheezing, pulse 100-120.    - SEVERE: Very SOB at rest, speaks in single words, struggling to breathe, sitting hunched forward, retractions, pulse > 120      Speaking and activity  5. RECURRENT SYMPTOM: "Have you had difficulty breathing before?" If Yes, ask: "When was the last time?" and "What happened that time?"      yes 6. CARDIAC HISTORY: "Do you have any history of heart disease?" (e.g., heart attack, angina, bypass surgery, angioplasty)      yes 7. LUNG HISTORY: "Do you have any history of lung disease?"  (e.g., pulmonary embolus, asthma, emphysema)     Yes 8. CAUSE: "What do you think is causing the breathing problem?"      Not having maintanence inhaler  9. OTHER SYMPTOMS: "Do you have any other symptoms? (e.g., dizziness, runny nose, cough, chest pain, fever)     Cough  10. O2 SATURATION MONITOR:  "Do you use an oxygen saturation monitor (pulse oximeter) at home?" If Yes, "What is your reading (oxygen level) today?" "What is your usual oxygen saturation reading?" (e.g., 95%)       no 11. PREGNANCY: "Is there any chance you are pregnant?" "When was your last menstrual period?"       no 12. TRAVEL: "Have you traveled out of the country  in the last month?" (e.g., travel history, exposures)       no  Protocols used: Breathing Difficulty-A-AH

## 2021-09-08 NOTE — Telephone Encounter (Signed)
Lost call during transfer from agent. Call sent to another NT.

## 2021-09-09 ENCOUNTER — Other Ambulatory Visit: Payer: Self-pay

## 2021-09-09 ENCOUNTER — Encounter: Payer: Self-pay | Admitting: Emergency Medicine

## 2021-09-09 ENCOUNTER — Inpatient Hospital Stay
Admission: EM | Admit: 2021-09-09 | Discharge: 2021-09-17 | DRG: 246 | Disposition: A | Discharge status: Home Health | Payer: Medicare Other | Attending: Hospitalist | Admitting: Hospitalist

## 2021-09-09 ENCOUNTER — Emergency Department: Payer: Medicare Other

## 2021-09-09 DIAGNOSIS — R7989 Other specified abnormal findings of blood chemistry: Secondary | ICD-10-CM | POA: Diagnosis present

## 2021-09-09 DIAGNOSIS — I509 Heart failure, unspecified: Secondary | ICD-10-CM

## 2021-09-09 DIAGNOSIS — J431 Panlobular emphysema: Secondary | ICD-10-CM | POA: Diagnosis present

## 2021-09-09 DIAGNOSIS — R911 Solitary pulmonary nodule: Secondary | ICD-10-CM | POA: Diagnosis not present

## 2021-09-09 DIAGNOSIS — I2582 Chronic total occlusion of coronary artery: Secondary | ICD-10-CM | POA: Diagnosis not present

## 2021-09-09 DIAGNOSIS — I2510 Atherosclerotic heart disease of native coronary artery without angina pectoris: Secondary | ICD-10-CM | POA: Diagnosis present

## 2021-09-09 DIAGNOSIS — Z7902 Long term (current) use of antithrombotics/antiplatelets: Secondary | ICD-10-CM

## 2021-09-09 DIAGNOSIS — I214 Non-ST elevation (NSTEMI) myocardial infarction: Secondary | ICD-10-CM | POA: Diagnosis present

## 2021-09-09 DIAGNOSIS — Z9981 Dependence on supplemental oxygen: Secondary | ICD-10-CM

## 2021-09-09 DIAGNOSIS — M810 Age-related osteoporosis without current pathological fracture: Secondary | ICD-10-CM | POA: Diagnosis present

## 2021-09-09 DIAGNOSIS — L570 Actinic keratosis: Secondary | ICD-10-CM | POA: Diagnosis present

## 2021-09-09 DIAGNOSIS — Z20822 Contact with and (suspected) exposure to covid-19: Secondary | ICD-10-CM | POA: Diagnosis present

## 2021-09-09 DIAGNOSIS — E785 Hyperlipidemia, unspecified: Secondary | ICD-10-CM | POA: Diagnosis present

## 2021-09-09 DIAGNOSIS — I10 Essential (primary) hypertension: Secondary | ICD-10-CM | POA: Diagnosis not present

## 2021-09-09 DIAGNOSIS — R778 Other specified abnormalities of plasma proteins: Secondary | ICD-10-CM | POA: Diagnosis not present

## 2021-09-09 DIAGNOSIS — R54 Age-related physical debility: Secondary | ICD-10-CM | POA: Diagnosis not present

## 2021-09-09 DIAGNOSIS — Z90710 Acquired absence of both cervix and uterus: Secondary | ICD-10-CM

## 2021-09-09 DIAGNOSIS — Z7982 Long term (current) use of aspirin: Secondary | ICD-10-CM

## 2021-09-09 DIAGNOSIS — R262 Difficulty in walking, not elsewhere classified: Secondary | ICD-10-CM | POA: Diagnosis not present

## 2021-09-09 DIAGNOSIS — Z79899 Other long term (current) drug therapy: Secondary | ICD-10-CM

## 2021-09-09 DIAGNOSIS — E876 Hypokalemia: Secondary | ICD-10-CM | POA: Diagnosis not present

## 2021-09-09 DIAGNOSIS — Z8249 Family history of ischemic heart disease and other diseases of the circulatory system: Secondary | ICD-10-CM

## 2021-09-09 DIAGNOSIS — I5043 Acute on chronic combined systolic (congestive) and diastolic (congestive) heart failure: Secondary | ICD-10-CM | POA: Diagnosis not present

## 2021-09-09 DIAGNOSIS — R918 Other nonspecific abnormal finding of lung field: Secondary | ICD-10-CM

## 2021-09-09 DIAGNOSIS — Z7951 Long term (current) use of inhaled steroids: Secondary | ICD-10-CM

## 2021-09-09 DIAGNOSIS — J9621 Acute and chronic respiratory failure with hypoxia: Secondary | ICD-10-CM | POA: Diagnosis not present

## 2021-09-09 DIAGNOSIS — J449 Chronic obstructive pulmonary disease, unspecified: Secondary | ICD-10-CM | POA: Diagnosis not present

## 2021-09-09 DIAGNOSIS — G4736 Sleep related hypoventilation in conditions classified elsewhere: Secondary | ICD-10-CM | POA: Diagnosis present

## 2021-09-09 DIAGNOSIS — H3530 Unspecified macular degeneration: Secondary | ICD-10-CM | POA: Diagnosis present

## 2021-09-09 DIAGNOSIS — N179 Acute kidney failure, unspecified: Secondary | ICD-10-CM | POA: Diagnosis not present

## 2021-09-09 DIAGNOSIS — M19041 Primary osteoarthritis, right hand: Secondary | ICD-10-CM | POA: Diagnosis present

## 2021-09-09 DIAGNOSIS — I130 Hypertensive heart and chronic kidney disease with heart failure and stage 1 through stage 4 chronic kidney disease, or unspecified chronic kidney disease: Secondary | ICD-10-CM | POA: Diagnosis not present

## 2021-09-09 DIAGNOSIS — D692 Other nonthrombocytopenic purpura: Secondary | ICD-10-CM | POA: Diagnosis present

## 2021-09-09 DIAGNOSIS — M19042 Primary osteoarthritis, left hand: Secondary | ICD-10-CM | POA: Diagnosis present

## 2021-09-09 DIAGNOSIS — I5021 Acute systolic (congestive) heart failure: Secondary | ICD-10-CM | POA: Diagnosis present

## 2021-09-09 DIAGNOSIS — I222 Subsequent non-ST elevation (NSTEMI) myocardial infarction: Principal | ICD-10-CM | POA: Diagnosis present

## 2021-09-09 DIAGNOSIS — J439 Emphysema, unspecified: Secondary | ICD-10-CM | POA: Diagnosis present

## 2021-09-09 DIAGNOSIS — Z87891 Personal history of nicotine dependence: Secondary | ICD-10-CM

## 2021-09-09 DIAGNOSIS — J90 Pleural effusion, not elsewhere classified: Secondary | ICD-10-CM | POA: Diagnosis not present

## 2021-09-09 DIAGNOSIS — K219 Gastro-esophageal reflux disease without esophagitis: Secondary | ICD-10-CM | POA: Diagnosis present

## 2021-09-09 DIAGNOSIS — Z66 Do not resuscitate: Secondary | ICD-10-CM | POA: Diagnosis present

## 2021-09-09 DIAGNOSIS — N1832 Chronic kidney disease, stage 3b: Secondary | ICD-10-CM | POA: Diagnosis present

## 2021-09-09 DIAGNOSIS — I252 Old myocardial infarction: Secondary | ICD-10-CM

## 2021-09-09 DIAGNOSIS — I517 Cardiomegaly: Secondary | ICD-10-CM | POA: Diagnosis not present

## 2021-09-09 DIAGNOSIS — Z85828 Personal history of other malignant neoplasm of skin: Secondary | ICD-10-CM

## 2021-09-09 DIAGNOSIS — R0602 Shortness of breath: Secondary | ICD-10-CM | POA: Diagnosis not present

## 2021-09-09 DIAGNOSIS — Z888 Allergy status to other drugs, medicaments and biological substances status: Secondary | ICD-10-CM

## 2021-09-09 DIAGNOSIS — J811 Chronic pulmonary edema: Secondary | ICD-10-CM | POA: Diagnosis not present

## 2021-09-09 DIAGNOSIS — I110 Hypertensive heart disease with heart failure: Secondary | ICD-10-CM | POA: Diagnosis not present

## 2021-09-09 DIAGNOSIS — Z8261 Family history of arthritis: Secondary | ICD-10-CM

## 2021-09-09 DIAGNOSIS — Z8673 Personal history of transient ischemic attack (TIA), and cerebral infarction without residual deficits: Secondary | ICD-10-CM

## 2021-09-09 DIAGNOSIS — H9190 Unspecified hearing loss, unspecified ear: Secondary | ICD-10-CM | POA: Diagnosis present

## 2021-09-09 DIAGNOSIS — J9811 Atelectasis: Secondary | ICD-10-CM | POA: Diagnosis not present

## 2021-09-09 LAB — URINALYSIS, ROUTINE W REFLEX MICROSCOPIC
Bacteria, UA: NONE SEEN
Bilirubin Urine: NEGATIVE
Glucose, UA: NEGATIVE mg/dL
Ketones, ur: NEGATIVE mg/dL
Leukocytes,Ua: NEGATIVE
Nitrite: NEGATIVE
Protein, ur: 100 mg/dL — AB
Specific Gravity, Urine: 1.013 (ref 1.005–1.030)
pH: 6 (ref 5.0–8.0)

## 2021-09-09 LAB — CBC WITH DIFFERENTIAL/PLATELET
Abs Immature Granulocytes: 0.03 10*3/uL (ref 0.00–0.07)
Basophils Absolute: 0 10*3/uL (ref 0.0–0.1)
Basophils Relative: 1 %
Eosinophils Absolute: 0.1 10*3/uL (ref 0.0–0.5)
Eosinophils Relative: 1 %
HCT: 36.7 % (ref 36.0–46.0)
Hemoglobin: 12.1 g/dL (ref 12.0–15.0)
Immature Granulocytes: 0 %
Lymphocytes Relative: 13 %
Lymphs Abs: 1.1 10*3/uL (ref 0.7–4.0)
MCH: 32.5 pg (ref 26.0–34.0)
MCHC: 33 g/dL (ref 30.0–36.0)
MCV: 98.7 fL (ref 80.0–100.0)
Monocytes Absolute: 0.8 10*3/uL (ref 0.1–1.0)
Monocytes Relative: 10 %
Neutro Abs: 5.9 10*3/uL (ref 1.7–7.7)
Neutrophils Relative %: 75 %
Platelets: 246 10*3/uL (ref 150–400)
RBC: 3.72 MIL/uL — ABNORMAL LOW (ref 3.87–5.11)
RDW: 14.9 % (ref 11.5–15.5)
WBC: 7.9 10*3/uL (ref 4.0–10.5)
nRBC: 0 % (ref 0.0–0.2)

## 2021-09-09 LAB — TROPONIN I (HIGH SENSITIVITY)
Troponin I (High Sensitivity): 273 ng/L (ref <18)
Troponin I (High Sensitivity): 341 ng/L (ref <18)
Troponin I (High Sensitivity): 284 ng/L (ref <18)

## 2021-09-09 LAB — COMPREHENSIVE METABOLIC PANEL
ALT: 22 U/L (ref 0–44)
AST: 34 U/L (ref 15–41)
Albumin: 3.5 g/dL (ref 3.5–5.0)
Alkaline Phosphatase: 145 U/L — ABNORMAL HIGH (ref 38–126)
Anion gap: 10 (ref 5–15)
BUN: 27 mg/dL — ABNORMAL HIGH (ref 8–23)
CO2: 27 mmol/L (ref 22–32)
Calcium: 9.8 mg/dL (ref 8.9–10.3)
Chloride: 105 mmol/L (ref 98–111)
Creatinine, Ser: 1.14 mg/dL — ABNORMAL HIGH (ref 0.44–1.00)
GFR, Estimated: 46 mL/min — ABNORMAL LOW (ref >60)
Glucose, Bld: 175 mg/dL — ABNORMAL HIGH (ref 70–99)
Potassium: 3.8 mmol/L (ref 3.5–5.1)
Sodium: 142 mmol/L (ref 135–145)
Total Bilirubin: 1.5 mg/dL — ABNORMAL HIGH (ref 0.3–1.2)
Total Protein: 7.2 g/dL (ref 6.5–8.1)

## 2021-09-09 LAB — APTT: aPTT: 28 seconds (ref 24–36)

## 2021-09-09 LAB — RESP PANEL BY RT-PCR (FLU A&B, COVID) ARPGX2
Influenza A by PCR: NEGATIVE
Influenza B by PCR: NEGATIVE
SARS Coronavirus 2 by RT PCR: NEGATIVE

## 2021-09-09 LAB — PHOSPHORUS: Phosphorus: 2 mg/dL — ABNORMAL LOW (ref 2.5–4.6)

## 2021-09-09 LAB — PROTIME-INR
INR: 1.1 (ref 0.8–1.2)
Prothrombin Time: 14 seconds (ref 11.4–15.2)

## 2021-09-09 LAB — BRAIN NATRIURETIC PEPTIDE: B Natriuretic Peptide: 1905.6 pg/mL — ABNORMAL HIGH (ref 0.0–100.0)

## 2021-09-09 LAB — MAGNESIUM: Magnesium: 2.2 mg/dL (ref 1.7–2.4)

## 2021-09-09 LAB — PROCALCITONIN: Procalcitonin: <0.1 ng/mL

## 2021-09-09 MED ORDER — NITROGLYCERIN 0.4 MG SL SUBL: 0.4000 mg | SUBLINGUAL_TABLET | SUBLINGUAL | Status: DC | PRN

## 2021-09-09 MED ORDER — HEPARIN BOLUS VIA INFUSION
2750.0000 [IU] | Freq: Once | INTRAVENOUS | Status: AC
  Administered 2021-09-09: 2750 [IU] via INTRAVENOUS
  Filled 2021-09-09: qty 2750

## 2021-09-09 MED ORDER — ASPIRIN 81 MG PO CHEW
243.0000 mg | CHEWABLE_TABLET | Freq: Once | ORAL | Status: AC
  Administered 2021-09-09: 243 mg via ORAL
  Filled 2021-09-09: qty 3

## 2021-09-09 MED ORDER — TICAGRELOR 90 MG PO TABS: 90.0000 mg | ORAL_TABLET | Freq: Two times a day (BID) | ORAL | Status: DC

## 2021-09-09 MED ORDER — FUROSEMIDE 10 MG/ML IJ SOLN
40.0000 mg | Freq: Every day | INTRAMUSCULAR | Status: AC
  Administered 2021-09-10: 40 mg via INTRAVENOUS
  Filled 2021-09-09: qty 4

## 2021-09-09 MED ORDER — RANOLAZINE ER 500 MG PO TB12
500.0000 mg | ORAL_TABLET | Freq: Two times a day (BID) | ORAL | Status: DC
  Administered 2021-09-10 (×2): 500 mg via ORAL
  Filled 2021-09-09 (×5): qty 1

## 2021-09-09 MED ORDER — UMECLIDINIUM-VILANTEROL 62.5-25 MCG/ACT IN AEPB
1.0000 | INHALATION_SPRAY | Freq: Every day | RESPIRATORY_TRACT | Status: DC
  Administered 2021-09-10 – 2021-09-17 (×7): 1 via RESPIRATORY_TRACT
  Filled 2021-09-09 (×3): qty 14

## 2021-09-09 MED ORDER — ROSUVASTATIN CALCIUM 10 MG PO TABS
10.0000 mg | ORAL_TABLET | Freq: Every day | ORAL | Status: DC
  Administered 2021-09-10 – 2021-09-17 (×7): 10 mg via ORAL
  Filled 2021-09-09 (×8): qty 1

## 2021-09-09 MED ORDER — ONDANSETRON HCL 4 MG PO TABS: 4.0000 mg | ORAL_TABLET | Freq: Four times a day (QID) | ORAL | Status: DC | PRN

## 2021-09-09 MED ORDER — ALBUTEROL SULFATE (2.5 MG/3ML) 0.083% IN NEBU
3.0000 mL | INHALATION_SOLUTION | Freq: Four times a day (QID) | RESPIRATORY_TRACT | Status: DC | PRN
  Administered 2021-09-11 – 2021-09-13 (×3): 3 mL via RESPIRATORY_TRACT
  Filled 2021-09-09 (×3): qty 3

## 2021-09-09 MED ORDER — ISOSORBIDE MONONITRATE ER 30 MG PO TB24
30.0000 mg | ORAL_TABLET | Freq: Every day | ORAL | Status: DC
  Administered 2021-09-10 – 2021-09-11 (×2): 30 mg via ORAL
  Filled 2021-09-09 (×3): qty 1

## 2021-09-09 MED ORDER — ACETAMINOPHEN 325 MG PO TABS: 650.0000 mg | ORAL_TABLET | Freq: Four times a day (QID) | ORAL | Status: AC | PRN

## 2021-09-09 MED ORDER — ACETAMINOPHEN 650 MG RE SUPP
650.0000 mg | Freq: Four times a day (QID) | RECTAL | Status: AC | PRN
  Filled 2021-09-09: qty 1

## 2021-09-09 MED ORDER — METOPROLOL TARTRATE 25 MG PO TABS
12.5000 mg | ORAL_TABLET | Freq: Two times a day (BID) | ORAL | Status: DC
  Administered 2021-09-10 – 2021-09-17 (×11): 12.5 mg via ORAL
  Filled 2021-09-09 (×14): qty 1

## 2021-09-09 MED ORDER — HEPARIN (PORCINE) 25000 UT/250ML-% IV SOLN
600.0000 [IU]/h | INTRAVENOUS | Status: DC
  Administered 2021-09-09 – 2021-09-11 (×2): 600 [IU]/h via INTRAVENOUS
  Filled 2021-09-09 (×2): qty 250

## 2021-09-09 MED ORDER — FUROSEMIDE 10 MG/ML IJ SOLN
40.0000 mg | Freq: Once | INTRAMUSCULAR | Status: AC
  Administered 2021-09-09: 40 mg via INTRAVENOUS
  Filled 2021-09-09: qty 4

## 2021-09-09 MED ORDER — HYDROCODONE-ACETAMINOPHEN 5-325 MG PO TABS
1.0000 | ORAL_TABLET | Freq: Three times a day (TID) | ORAL | Status: DC | PRN
  Administered 2021-09-10 – 2021-09-17 (×15): 1 via ORAL
  Filled 2021-09-09 (×15): qty 1

## 2021-09-09 MED ORDER — ONDANSETRON HCL 4 MG/2ML IJ SOLN: 4.0000 mg | Freq: Four times a day (QID) | INTRAMUSCULAR | Status: DC | PRN

## 2021-09-09 NOTE — H&P (Signed)
History and Physical   ANGELIA HAZELL SFK:[id number] DOB: 1929/09/03 DOA: 09/09/2021  PCP: Steele Sizer, MD  Outpatient Specialists:  Patient coming from: Home via [place]  I have personally briefly reviewed patient's old medical records in [place].  Chief Concern: Shortness of breath  HPI: Samantha Dawson is a 87 y.o. female with medical history significant for CAD, hypertension, heart failure reduced ejection fraction, osteoarthritis, pan lobar emphysema, degenerative disc disease, history of tobacco use, GERD, nonmelanoma skin cancer, who presents to the emergency department for chief concerns of shortness of breath.  She reports the shortness of breath improved after discharge from the hospital last time, she felt better until Tuesday, 09/06/2021. She reports that this episode feels like 3 weeks ago.  At bedside she is able to tell me her name, current location of hospital, and her age.  She reports to me that [name] is her daughter and healthcare power of attorney.  She states that she declined the left heart cath last time because she has had 2 catheterizations in the past.  She reports that 1 catheterization left her with a mild stroke therefore she does not want to undergo catheterization anymore.  She still feel the same way at this time.  She further endorses that if her heart were to stop beating she does not want chest compression.  She reports that she does not want to be intubated and put in the ICU.  She reports that at this time she does not have any chest pain, abdominal pain, fever, cough, diarrhea, dysuria.  She reports that since being in the emergency department, her shortness of breath has improved with the medications given.  She is not aware of any weight increase.  She thought that she might of lost a couple of pounds.  She does endorse lower extremity swelling.  At bedside, patient is able to tell me her name, age, current location of hospital, identify her POA  by name.  She appears frail and does have moderate to severe hearing loss.  She was able to participate in her HP examination.  Social history: She lives at home by herself.  She is a former tobacco user.  She denies EtOH and recreational drug use at this time.  ROS: Constitutional: no weight change, no fever ENT/Mouth: no sore throat, no rhinorrhea Eyes: no eye pain, no vision changes Cardiovascular: no chest pain, + dyspnea,  no edema, no palpitations Respiratory: no cough, no sputum, no wheezing Gastrointestinal: no nausea, no vomiting, no diarrhea, no constipation Genitourinary: no urinary incontinence, no dysuria, no hematuria Musculoskeletal: no arthralgias, no myalgias Skin: no skin lesions, no pruritus, Neuro: + weakness, no loss of consciousness, no syncope Psych: no anxiety, no depression, no decrease appetite Heme/Lymph: no bruising, no bleeding  ED Course: Discussed with emergency medicine provider, patient requiring hospitalization for chief concerns of heart failure exacerbation.  Initial vitals in the emergency department was remarkable for temperature of 97.7, respiration rate of 20, heart rate of 99, initial blood pressure 136/63, improved to 139/70, SPO2 of 96% on 2 L nasal cannula.  Sodium was 142, potassium 3.8, chloride 105, bicarb 27, BUN of 27, serum creatinine of 1.14, nonfasting blood glucose 175.  GFR was 46.  WBC 7.9, hemoglobin 12.1, platelets 246.  BNP is elevated at 1905.6.  High-sensitivity troponin was initially 273 and increased to 341.  In the emergency department patient was given aspirin 243 mg, furosemide 40 mg IV.  Assessment/Plan  Principal Problem:  NSTEMI (non-ST elevated myocardial infarction) ([place]) Active Problems:   Actinic keratosis   Hyperlipidemia   Essential hypertension   Panlobular emphysema (HCC)   Hx of smoking   Primary osteoarthritis of both hands   Acid reflux   Senile purpura (HCC)   Nocturnal hypoxemia due to emphysema  (HCC)   Acute systolic CHF (congestive heart failure) ([place])   Ms. Mareno is an 87 year old [place], AAOx4, female with CAD, combined heart failure, history of hypertension, history of tobacco use, emphysema, who is being admitted for presumed combined heart failure exacerbation, complicated by NSTEMI.  # Shortness of breath-etiology work-up in progress, I suspect this is secondary to combined heart failure exacerbation - Check procalcitonin, B12 - Status post furosemide 40 mg IV per EDP on day of admission - Furosemide 40 mg IV ordered for 09/10/2201, 1 dose ordered - Strict I's and O's  # NSTEMI-continue heparin GTT - High sensitive troponin is 273 and increased to 341 - Patient and family is declining left heart catheterization - They are requesting cardiology involvement - A.m. team to consult cardiology # Acute kidney injury-presumed secondary to cardiorenal - Serum creatinine on presentation is 1.14, GFR 46 - Previous serum creatinine is 0.86, GFR greater than 60 - Treat as above - BMP in a.m.  # CAD- - Home medications have been resumed: Imdur 30 mg daily, metoprolol tartrate 12.5 mg p.o. twice daily, Ranexa 500 mg every 12 hours, rosuvastatin 10 mg nightly, nitroglycerin sublingual tablets - Ticagrelor 90 mg p.o. twice daily have been held at this time as patient is started on heparin gtt. - A.m. team to consult cardiology, for further recommendations  # Hyperlipidemia-rosuvastatin 20 mg daily  # Emphysema-maintenance inhalers have been resumed  # Debility-difficulty ambulating - Daughter, [name], healthcare power of attorney is requesting a wheelchair - Physical therapy has been consulted - Transition of care managers have been consulted for this wheelchair  # Med reconciliation-med reconciliation has not been completed at the finding of this HPI - A.m. team to complete med - Pharmacy consultation for med reconciliation placed Chart reviewed.   Hospitalization from  08/18/2021 to 08/23/2021: Patient was admitted for NSTEMI and was started on heparin GTT.  It was noted the patient declined left heart cath at that time.  Patient was discharged home with aspirin, Brilinta twice daily, Imdur, metoprolol twice daily.  ACE/ARB were not given on discharge due to patient's hypotension.  Echocardiogram on 08/19/2021: Showed ejection fraction of 35 to 16%, grade 3 diastolic dysfunction  DVT prophylaxis: Heparin GTT Code Status: DNR/DNI, confirmed with patient at bedside and HPOA over the phone  Diet: N.p.o. except for sips with meds and ice chips Family Communication: Candise Bowens [phone] Disposition Plan: Pending clinical course, overall guarded prognosis Consults called: None at this time, a.m. team to consult cardiology Admission status: Progressive cardiac, observation, telemetry  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   Encounter for chronic pain management    Hx of smoking 04/17/2016   Hyperlipidemia    Hypertension    Macular degeneration    Osteoarthritis    Osteoporosis 03/22/2016   Primary osteoarthritis of both hands 05/03/2016   Past Surgical History:  Procedure Laterality Date   ABDOMINAL HYSTERECTOMY     CATARACT EXTRACTION  endaryerectomy     renal stenting     Social History:  reports that she quit smoking about 5 years ago. Her smoking use included cigarettes. She has a 50.00 pack-year smoking history. She has never been exposed to tobacco smoke. She has never used smokeless tobacco. She reports that she does not drink alcohol and does not use drugs.  Allergies  Allergen Reactions   Alendronate Nausea Only   Family History  Problem Relation Age of Onset   Cancer Mother     Coronary artery disease Father    Cerebrovascular Accident Father    Cancer Sister        breast cancer   Cancer Brother        stomach   Heart disease Daughter        heart vavle bypass?   Stroke Sister    Lung disease Brother    Cancer Brother        unknown   Arthritis Daughter    Family history: Family history reviewed and pertinent for coronary artery disease in father.  Prior to Admission medications   Medication Sig Start Date End Date Taking? Authorizing Provider  albuterol (PROAIR HFA) 108 (90 Base) MCG/ACT inhaler INHALE 2 PUFFS INTO THE LUNGS EVERY 4 HOURS AS NEEDED FOR WHEEZING OR SHORTNESS OF BREATH; FUTURE REFILLS FROM LUNG DOCTOR 08/31/21   Steele Sizer, MD  aspirin 81 MG tablet Take 81 mg by mouth daily.    [provider]  HYDROcodone-acetaminophen (NORCO/VICODIN) 5-325 MG tablet Take 1 tablet by mouth 3 (three) times daily as needed for moderate pain. Fill September 07/31/2021 11/06/21   Steele Sizer, MD  HYDROcodone-acetaminophen (NORCO/VICODIN) 5-325 MG tablet Take 1 tablet by mouth 3 (three) times daily as needed for moderate pain. To last one month 10/07/21   Steele Sizer, MD  HYDROcodone-acetaminophen (NORCO/VICODIN) 5-325 MG tablet Take 1 tablet by mouth 3 (three) times daily as needed for moderate pain. To last one month 09/08/21   Steele Sizer, MD  isosorbide mononitrate (IMDUR) 30 MG 24 hr tablet Take 1 tablet (30 mg total) by mouth daily. Patient not taking: No sig reported 08/24/21   Jennye Boroughs, MD  metoprolol tartrate (LOPRESSOR) 25 MG tablet Take 0.5 tablets (12.5 mg total) by mouth 2 (two) times daily. Patient not taking: No sig reported 08/23/21   Jennye Boroughs, MD  naloxone [facility]) nasal spray 4 mg/0.1 mL Prn narcotic overdose Patient not taking: No sig reported 03/02/21   Steele Sizer, MD  nitroGLYCERIN (NITROSTAT) 0.4 MG SL tablet Place 1 tablet (0.4 mg total) under the tongue every 5 (five) minutes x 3 doses as needed for  chest pain. Do not take more than 3 doses in a day. Patient not taking: No sig reported 08/23/21   Jennye Boroughs, MD  pantoprazole (PROTONIX) 20 MG tablet Take 1 tablet (20 mg total) by mouth 2 (two) times daily. Caution:prolonged use may increase risk of pneumonia, colitis, osteoporosis, anemia.  Skip doses if you can 11/30/20   Steele Sizer, MD  ranolazine (RANEXA) 500 MG 12 hr tablet Take 1 tablet (500 mg total) by mouth 2 (two) times daily. Patient not taking: No sig reported 08/23/21   Jennye Boroughs, MD  rosuvastatin (CRESTOR) 20 MG tablet Take 20 mg by mouth daily. Patient not taking: No sig reported 08/11/21   [provider]  ticagrelor (BRILINTA) 90 MG TABS tablet Take 1 tablet (90 mg total) by mouth 2 (two) times daily. 08/23/21   Jennye Boroughs, MD  umeclidinium-vilanterol (ANORO ELLIPTA) 62.5-25 MCG/ACT AEPB Inhale 1 puff into the lungs daily at 6 (six) AM. 08/31/21   Steele Sizer, MD   Physical Exam: Vitals:   09/09/21 1850 09/09/21 1900 09/09/21 1930 09/09/21 2000  BP: 139/70 140/76 (!) 142/71 (!) 141/68  Pulse: (!) 102 (!) 110 87 84  Resp: (!) 27 (!) 23 19 19   Temp:      TempSrc:      SpO2: 95% (!) 88% 99% 97%  Weight:      Height:       Constitutional: appears age-appropriate, frail, NAD, calm, comfortable Eyes: PERRL, lids and conjunctivae normal ENMT: Mucous membranes are moist. Posterior pharynx clear of any exudate or lesions. Age-appropriate dentition.  Moderate to severe hearing loss Neck: normal, supple, no masses, no thyromegaly Respiratory: clear to auscultation bilaterally, no wheezing, no crackles. Normal respiratory effort. No accessory muscle use.  Cardiovascular: Regular rate and rhythm, no murmurs / rubs / gallops.  Bilateral 2+ pitting edema of the lower extremity. 2+ pedal pulses. No carotid bruits.  Abdomen: no tenderness, no masses palpated, no hepatosplenomegaly. Bowel sounds positive.  Musculoskeletal: no clubbing / cyanosis. No joint  deformity upper and lower extremities. Good ROM, no contractures, no atrophy. Normal muscle tone.  Skin: no rashes, lesions, ulcers. No induration Neurologic: Sensation intact. Strength 5/5 in all 4.  Psychiatric: Normal judgment and insight. Alert and oriented x 3. Normal mood.   EKG: independently reviewed, showing sinus rhythm with rate of 98, QTc 449  Chest x-ray on Admission: I personally reviewed and I agree with radiologist reading as below.  DG Chest 1 View  Result Date: 09/09/2021 CLINICAL DATA:  Increasing shortness of breath, coronary artery disease, recent MI 3 weeks ago, pedal edema, COPD, hypertension EXAM: CHEST  1 VIEW COMPARISON:  Portable exam 1429 hours compared 08/22/2021 FINDINGS: Enlargement of cardiac silhouette with pulmonary vascular congestion. Atherosclerotic calcification aorta. Interstitial infiltrates in both lungs greatest at bases with coexistence bibasilar effusions and atelectasis. Findings favor CHF. Suspect underlying emphysematous changes. No pneumothorax. Bones demineralized. IMPRESSION: Enlargement of cardiac silhouette with pulmonary vascular congestion and probable pulmonary edema/CHF. Small bibasilar pleural effusions and atelectasis. Underlying emphysematous changes. Aortic Atherosclerosis (ICD10-I70.0) and Emphysema (ICD10-J43.9). Electronically Signed   By: Lavonia Dana M.D.   On: 09/09/2021 14:44    Labs on Admission: I have personally reviewed following labs  CBC: Recent Labs  Lab 09/09/21 1402  WBC 7.9  NEUTROABS 5.9  HGB 12.1  HCT 36.7  MCV 98.7  PLT 094   Basic Metabolic Panel: Recent Labs  Lab 09/09/21 1402 09/09/21 1956  NA 142  --   K 3.8  --   CL 105  --   CO2 27  --   GLUCOSE 175*  --   BUN 27*  --   CREATININE 1.14*  --   CALCIUM 9.8  --   MG  --  2.2  PHOS  --  2.0*   GFR: Estimated Creatinine Clearance: 24 mL/min (A) (by C-G formula based on SCr of 1.14 mg/dL (H)).  Liver Function Tests: Recent Labs  Lab  09/09/21 1402  AST 34  ALT 22  ALKPHOS 145*  BILITOT 1.5*  PROT 7.2  ALBUMIN 3.5   Urine analysis:    Component Value Date/Time   COLORURINE YELLOW (A) 09/09/2021 1851   APPEARANCEUR CLEAR (A) 09/09/2021 1851   LABSPEC 1.013 09/09/2021 1851   PHURINE 6.0 09/09/2021 1851   GLUCOSEU NEGATIVE 09/09/2021 1851   HGBUR SMALL (A) 09/09/2021  [place] 09/09/2021 [place] 09/09/2021 1851   PROTEINUR 100 (A) 09/09/2021 1851   NITRITE NEGATIVE 09/09/2021 1851   LEUKOCYTESUR NEGATIVE 09/09/2021 1851   Dr. Tobie Poet [facility]  If 7PM-7AM, please contact overnight-coverage provider If 7AM-7PM, please contact day coverage provider [email or web address]  09/09/2021, 8:57 PM

## 2021-09-09 NOTE — Telephone Encounter (Signed)
Pt called and notified, She has telephone visit scheduled for next wed.  I told her she could come in sooner if need be, just to call

## 2021-09-09 NOTE — ED Notes (Signed)
Pt assisted to bed, pt assisted with undressing, purewick placed on patient.

## 2021-09-09 NOTE — ED Provider Notes (Signed)
[facility] Emergency Department Provider Note   ____________________________________________   I have reviewed the triage vital signs and the nursing notes.   HISTORY  Chief Complaint Shortness of Breath   History limited by: Not Limited   HPI Samantha Dawson is a 87 y.o. female who presents to the emergency department today because of concerns of shortness of breath.  The patient states that her shortness of breath started 3 days ago.  Has continued since then.  It is significant when she tries to exert herself. She denies any associated chest pain. Has noticed some swelling in her feet.  She is on 3 L of oxygen at home at baseline.  Patient states roughly 3 weeks ago she suffered a heart attack.   Records reviewed. Per medical record review patient has a history of recent admission for heart attack, opted to not undergo left heart cath.   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   Encounter for chronic pain management    Hx of smoking 04/17/2016   Hyperlipidemia    Hypertension    Macular degeneration    Osteoarthritis    Osteoporosis 03/22/2016   Primary osteoarthritis of both hands 05/03/2016    Patient Active Problem List   Diagnosis Date Noted   CHF (congestive heart failure), NYHA class IV, acute, diastolic ([place]) 02/58/5277   Thrombocytopenia ([place]) 82/42/3536   Acute systolic CHF (congestive heart failure) ([place]) 08/23/2021   Hypertensive emergency 08/23/2021   NSTEMI (non-ST elevated myocardial infarction) ([place]) 08/18/2021   History of nonmelanoma skin cancer 06/06/2020   Senile purpura ([place]) 05/04/2020   Nocturnal hypoxemia due to emphysema ([place]) 05/04/2020   Carotid atherosclerosis, bilateral  08/27/2018   Bilateral carotid bruits 08/27/2018   CKD (chronic kidney disease) stage 3, GFR 30-59 ml/min (HCC) 08/27/2018   Age-related osteoporosis without current pathological fracture 08/11/2018   Shoulder pain, left 08/29/2017   Acid reflux 14/43/1540   Uncomplicated opioid use 08/67/6195   Hyperglycemia 09/02/2016   Left thyroid nodule 07/13/2016   Abnormal bone scan of cervical spine 06/18/2016   Primary osteoarthritis of both hands 05/03/2016   Elevated serum alkaline phosphatase level 05/03/2016   Hyperkalemia 05/03/2016   Degenerative disc disease, thoracic 05/03/2016   Hx of smoking 04/17/2016   Controlled substance agreement signed 03/22/2016   Back pain 03/22/2016   Hyperlipidemia 10/25/2015   Essential hypertension 10/25/2015   Panlobular emphysema ([place]) 10/25/2015   Chronic pain 05/25/2015   Actinic keratosis 05/25/2015    Past Surgical History:  Procedure Laterality Date   ABDOMINAL HYSTERECTOMY     CATARACT EXTRACTION     endaryerectomy     renal stenting      Prior to Admission medications   Medication Sig Start Date End Date Taking? Authorizing Provider  albuterol (PROAIR HFA) 108 (90 Base) MCG/ACT inhaler INHALE 2 PUFFS INTO THE LUNGS EVERY 4 HOURS AS NEEDED FOR WHEEZING OR SHORTNESS OF BREATH; FUTURE REFILLS FROM LUNG DOCTOR 08/31/21   Steele Sizer, MD  aspirin 81 MG tablet Take 81 mg by mouth daily.    [provider]  HYDROcodone-acetaminophen (NORCO/VICODIN) 5-325 MG tablet Take 1 tablet by mouth 3 (three) times daily as needed for moderate pain. Fill September 07/31/2021 11/06/21   Sowles, Drue Stager,  MD  HYDROcodone-acetaminophen (NORCO/VICODIN) 5-325 MG tablet Take 1 tablet by mouth 3 (three) times daily as needed for moderate pain. To last one month 10/07/21   Steele Sizer, MD  HYDROcodone-acetaminophen (NORCO/VICODIN) 5-325 MG tablet Take 1 tablet by mouth 3 (three) times daily as needed for moderate pain. To last one month 09/08/21   Steele Sizer, MD  isosorbide mononitrate (IMDUR) 30 MG 24 hr tablet Take 1 tablet (30 mg total) by mouth daily. Patient not taking: No sig reported 08/24/21   Jennye Boroughs, MD  metoprolol tartrate (LOPRESSOR) 25 MG tablet Take 0.5 tablets (12.5 mg total) by mouth 2 (two) times daily. Patient not taking: No sig reported 08/23/21   Jennye Boroughs, MD  naloxone [facility]) nasal spray 4 mg/0.1 mL Prn narcotic overdose Patient not taking: No sig reported 03/02/21   Steele Sizer, MD  nitroGLYCERIN (NITROSTAT) 0.4 MG SL tablet Place 1 tablet (0.4 mg total) under the tongue every 5 (five) minutes x 3 doses as needed for chest pain. Do not take more than 3 doses in a day. Patient not taking: No sig reported 08/23/21   Jennye Boroughs, MD  pantoprazole (PROTONIX) 20 MG tablet Take 1 tablet (20 mg total) by mouth 2 (two) times daily. Caution:prolonged use may increase risk of pneumonia, colitis, osteoporosis, anemia.  Skip doses if you can 11/30/20   Steele Sizer, MD  ranolazine (RANEXA) 500 MG 12 hr tablet Take 1 tablet (500 mg total) by mouth 2 (two) times daily. Patient not taking: No sig reported 08/23/21   Jennye Boroughs, MD  rosuvastatin (CRESTOR) 20 MG tablet Take 20 mg by mouth daily. Patient not taking: No sig reported 08/11/21   [provider]  ticagrelor (BRILINTA) 90 MG TABS tablet Take 1 tablet (90 mg total) by mouth 2 (two) times daily. 08/23/21   Jennye Boroughs, MD  umeclidinium-vilanterol [facility] ELLIPTA) 62.5-25 MCG/ACT AEPB Inhale 1 puff into the lungs daily at 6 (six) AM. 08/31/21   Steele Sizer, MD    Allergies Alendronate  Family History  Problem Relation Age of Onset   Cancer Mother    Coronary artery disease Father    Cerebrovascular Accident Father    Cancer Sister        breast cancer   Cancer Brother        stomach   Heart disease Daughter        heart vavle bypass?   Stroke Sister    Lung disease Brother    Cancer Brother        unknown   Arthritis  Daughter     Social History Social History   Tobacco Use   Smoking status: Former    Packs/day: 1.00    Years: 50.00    Pack years: 50.00    Types: Cigarettes    Quit date: 2017    Years since quitting: 5.8    Passive exposure: Never   Smokeless tobacco: Never   Tobacco comments:    smoking cesssation materials not required  Vaping Use   Vaping Use: Never used  Substance Use Topics   Alcohol use: No    Alcohol/week: 0.0 standard drinks   Drug use: No    Review of Systems Constitutional: No fever/chills Eyes: No visual changes. ENT: No sore throat. Cardiovascular: Denies chest pain. Respiratory: Positive for shortness of breath. Gastrointestinal: No abdominal pain.  No nausea, no vomiting.  No diarrhea.   Genitourinary: Negative for dysuria. Musculoskeletal: Lower extremity edema.  Skin: Negative for rash. Neurological:  Negative for headaches, focal weakness or numbness.  ____________________________________________   PHYSICAL EXAM:  VITAL SIGNS: ED Triage Vitals [09/09/21 1400]  Enc Vitals Group     BP 136/63     Pulse Rate 99     Resp 20     Temp 97.7 F (36.5 C)     Temp Source Oral     SpO2 96 %     Weight 102 lb (46.3 kg)     Height 5' (1.524 m)     Head Circumference      Peak Flow      Pain Score 0   Constitutional: Alert and oriented.  Eyes: Conjunctivae are normal.  ENT      Head: Normocephalic and atraumatic.      Nose: No congestion/rhinnorhea.      Mouth/Throat: Mucous membranes are moist.      Neck: No stridor. Hematological/Lymphatic/Immunilogical: No cervical lymphadenopathy. Cardiovascular: Normal rate, regular rhythm.  No murmurs, rubs, or gallops.  Respiratory: Normal respiratory effort without tachypnea nor retractions. Breath sounds are clear and equal bilaterally. No wheezes/rales/rhonchi. Gastrointestinal: Soft and non tender. No rebound. No guarding.  Genitourinary: Deferred Musculoskeletal: Normal range of motion in all  extremities. Positive for lower extremity edema.  Neurologic:  Normal speech and language. No gross focal neurologic deficits are appreciated.  Skin:  Skin is warm, dry and intact. No rash noted. Psychiatric: Mood and affect are normal. Speech and behavior are normal. Patient exhibits appropriate insight and judgment.  ____________________________________________    LABS (pertinent positives/negatives)  Trop hs 273 -> 341 BNP 1905.6 CBC wbc 7.9, hgb 12.1, plt 246 CMP na 142, k 3.8, glu 175, cr 1.14  ____________________________________________   EKG  I, Nance Pear, attending physician, personally viewed and interpreted this EKG  EKG Time: 1407 Rate: 98 Rhythm: normal sinus rhythm Axis: left axis deviation Intervals: qtc 449 QRS: incomplete RBBB ST changes: no st elevation Impression: abnormal ekg  ____________________________________________    RADIOLOGY  CXR Cardiomegaly. Pulmonary vascular congestion. Pulmonary edema.  ____________________________________________   PROCEDURES  Procedures  CRITICAL CARE Performed by: Nance Pear   Total critical care time: 30 minutes  Critical care time was exclusive of separately billable procedures and treating other patients.  Critical care was necessary to treat or prevent imminent or life-threatening deterioration.  Critical care was time spent personally by me on the following activities: development of treatment plan with patient and/or surrogate as well as nursing, discussions with consultants, evaluation of patient's response to treatment, examination of patient, obtaining history from patient or surrogate, ordering and performing treatments and interventions, ordering and review of laboratory studies, ordering and review of radiographic studies, pulse oximetry and re-evaluation of patient's condition.  ____________________________________________   INITIAL IMPRESSION / ASSESSMENT AND PLAN / ED  COURSE  Pertinent labs & imaging results that were available during my care of the patient were reviewed by me and considered in my medical decision making (see chart for details).   Patient presented to the emergency department today because of concerns for shortness of breath that started 3 days ago.  Patient did have a recent admission for heart attack however did not undergo a left heart cath.  Work-up today is concerning for pulmonary edema.  Chest x-ray shows edema and BNP is significantly elevated.  Patient was ordered lasix. Additionally the patient had elevated troponin. Will give aspirin and start heparin. Will plan on admission. Discussed findings and plan with patient and daughter in the room.  ____________________________________________  FINAL CLINICAL IMPRESSION(S) / ED DIAGNOSES  Final diagnoses:  Shortness of breath  Congestive heart failure, unspecified HF chronicity, unspecified heart failure type ([place])  Elevated troponin     Note: This dictation was prepared with Dragon dictation. Any transcriptional errors that result from this process are unintentional     Nance Pear, MD 09/09/21 1932

## 2021-09-09 NOTE — ED Triage Notes (Signed)
Pt comes into the ED via POV c/o increased [place] especially with exertion.  Pt states she had an MI 3 weeks ago.  Pt also noticed her feet are now swollen.  Pt denies any known CHF.  Pt states she cant lay flat and gets winded even when she eats food.  Pt wears 2 L at baseline for her COPD.  Pt currently is able to sit comfortably in her wheelchair with even and unlabored respirations.

## 2021-09-09 NOTE — ED Notes (Signed)
Pt resting in bed asleep on monitor

## 2021-09-09 NOTE — ED Notes (Signed)
Md goodman informed of patient critical troponin face to face.

## 2021-09-09 NOTE — ED Provider Notes (Signed)
Emergency Medicine Provider Triage Evaluation Note  ANASTAZJA ISAAC , a 87 y.o. female  was evaluated in triage.  Pt complains of shortness of breath on exertion that started worsening on Tuesday. MI 3 weeks ago. Bilateral LE swelling worsening as well. On 2liters O2 at home.  Review of Systems  Positive: Shortness of breath, extremity swelling Negative: Chest pain  Physical Exam  There were no vitals taken for this visit. Gen:   Awake, no distress   Resp:  Normal effort  MSK:   Moves extremities without difficulty  Other:    Medical Decision Making  Medically screening exam initiated at 1:58 PM.  Appropriate orders placed.  SEVIN FARONE was informed that the remainder of the evaluation will be completed by another provider, this initial triage assessment does not replace that evaluation, and the importance of remaining in the ED until their evaluation is complete.   Victorino Dike, FNP 09/09/21 1400    Arta Silence, MD 09/09/21 1948

## 2021-09-09 NOTE — Consult Note (Signed)
ANTICOAGULATION CONSULT NOTE - Consult  Pharmacy Consult for Heparin gtt Indication: chest pain/ACS  Allergies  Allergen Reactions   Alendronate Nausea Only    Patient Measurements: Height: 5' (152.4 cm) Weight: 46.3 kg (102 lb) IBW/kg (Calculated) : 45.5 Heparin Dosing Weight: 46.3kg  Vital Signs: Temp: 97.7 F (36.5 C) (11/04 1400) Temp Source: Oral (11/04 1400) BP: 139/70 (11/04 1850) Pulse Rate: 102 (11/04 1850)  Labs: Recent Labs    09/09/21 1402 09/09/21 1749  HGB 12.1  --   HCT 36.7  --   PLT 246  --   CREATININE 1.14*  --   TROPONINIHS 273* 341*    Estimated Creatinine Clearance: 24 mL/min (A) (by C-G formula based on SCr of 1.14 mg/dL (H)).   Medications:  Heparin Dosing Weight: 46.3kg PTA: ASA & brilinta. No AC upon chart review. Inpatient: +Heparin gtt  Assessment: 87yo Female w/ h/o COPD (on 2L chronic), HTN, mixed sys/diaCHF, HLD, CAD, CKD3 osteoporosis, presented with SOB x4 days & b/l LE edema ISO recent history of MI 3wks prior (on 08/18/2021) but opted not to undergo LHC at that time. Pharmacy consulted for mgmt of heparin gtt.  Date Time aPTT/HL Rate/Comment       Baseline Labs: aPTT - sent/pending INR - sent/pending Hgb - 12.1 Plts - 246 Troponin: 273>341  Goal of Therapy:  Heparin level 0.3-0.7 units/ml Monitor platelets by anticoagulation protocol: Yes   Plan:  On recent admission for MI, pt was therapeutic x2 on rate of 600 un/hr so will restart at this rate. Give 2750 units bolus x1; then start heparin infusion at 600 units/hr Check anti-Xa level in 8 hours and daily while on heparin Continue to monitor H&H and platelets  Lorna Dibble, PharmD, [facility] Clinical Pharmacist 09/09/2021,7:17 PM

## 2021-09-10 DIAGNOSIS — I214 Non-ST elevation (NSTEMI) myocardial infarction: Secondary | ICD-10-CM | POA: Diagnosis not present

## 2021-09-10 DIAGNOSIS — E785 Hyperlipidemia, unspecified: Secondary | ICD-10-CM | POA: Diagnosis present

## 2021-09-10 DIAGNOSIS — I2510 Atherosclerotic heart disease of native coronary artery without angina pectoris: Secondary | ICD-10-CM | POA: Diagnosis not present

## 2021-09-10 DIAGNOSIS — H3530 Unspecified macular degeneration: Secondary | ICD-10-CM | POA: Diagnosis present

## 2021-09-10 DIAGNOSIS — R54 Age-related physical debility: Secondary | ICD-10-CM | POA: Diagnosis present

## 2021-09-10 DIAGNOSIS — J439 Emphysema, unspecified: Secondary | ICD-10-CM | POA: Diagnosis not present

## 2021-09-10 DIAGNOSIS — R062 Wheezing: Secondary | ICD-10-CM | POA: Diagnosis not present

## 2021-09-10 DIAGNOSIS — R0600 Dyspnea, unspecified: Secondary | ICD-10-CM | POA: Diagnosis not present

## 2021-09-10 DIAGNOSIS — E876 Hypokalemia: Secondary | ICD-10-CM | POA: Diagnosis present

## 2021-09-10 DIAGNOSIS — M19042 Primary osteoarthritis, left hand: Secondary | ICD-10-CM | POA: Diagnosis present

## 2021-09-10 DIAGNOSIS — K219 Gastro-esophageal reflux disease without esophagitis: Secondary | ICD-10-CM | POA: Diagnosis present

## 2021-09-10 DIAGNOSIS — D692 Other nonthrombocytopenic purpura: Secondary | ICD-10-CM | POA: Diagnosis present

## 2021-09-10 DIAGNOSIS — R918 Other nonspecific abnormal finding of lung field: Secondary | ICD-10-CM | POA: Diagnosis not present

## 2021-09-10 DIAGNOSIS — R778 Other specified abnormalities of plasma proteins: Secondary | ICD-10-CM | POA: Diagnosis present

## 2021-09-10 DIAGNOSIS — I222 Subsequent non-ST elevation (NSTEMI) myocardial infarction: Secondary | ICD-10-CM | POA: Diagnosis present

## 2021-09-10 DIAGNOSIS — I700 Atherosclerosis of aorta: Secondary | ICD-10-CM | POA: Diagnosis not present

## 2021-09-10 DIAGNOSIS — J9621 Acute and chronic respiratory failure with hypoxia: Secondary | ICD-10-CM | POA: Diagnosis present

## 2021-09-10 DIAGNOSIS — I5043 Acute on chronic combined systolic (congestive) and diastolic (congestive) heart failure: Secondary | ICD-10-CM | POA: Diagnosis present

## 2021-09-10 DIAGNOSIS — R911 Solitary pulmonary nodule: Secondary | ICD-10-CM | POA: Diagnosis not present

## 2021-09-10 DIAGNOSIS — J431 Panlobular emphysema: Secondary | ICD-10-CM | POA: Diagnosis present

## 2021-09-10 DIAGNOSIS — I130 Hypertensive heart and chronic kidney disease with heart failure and stage 1 through stage 4 chronic kidney disease, or unspecified chronic kidney disease: Secondary | ICD-10-CM | POA: Diagnosis present

## 2021-09-10 DIAGNOSIS — I2582 Chronic total occlusion of coronary artery: Secondary | ICD-10-CM | POA: Diagnosis present

## 2021-09-10 DIAGNOSIS — N1832 Chronic kidney disease, stage 3b: Secondary | ICD-10-CM | POA: Diagnosis present

## 2021-09-10 DIAGNOSIS — R262 Difficulty in walking, not elsewhere classified: Secondary | ICD-10-CM | POA: Diagnosis present

## 2021-09-10 DIAGNOSIS — Z20822 Contact with and (suspected) exposure to covid-19: Secondary | ICD-10-CM | POA: Diagnosis present

## 2021-09-10 DIAGNOSIS — R0602 Shortness of breath: Secondary | ICD-10-CM | POA: Diagnosis not present

## 2021-09-10 DIAGNOSIS — R079 Chest pain, unspecified: Secondary | ICD-10-CM | POA: Diagnosis not present

## 2021-09-10 DIAGNOSIS — J90 Pleural effusion, not elsewhere classified: Secondary | ICD-10-CM | POA: Diagnosis not present

## 2021-09-10 DIAGNOSIS — M810 Age-related osteoporosis without current pathological fracture: Secondary | ICD-10-CM | POA: Diagnosis present

## 2021-09-10 DIAGNOSIS — N179 Acute kidney failure, unspecified: Secondary | ICD-10-CM | POA: Diagnosis present

## 2021-09-10 DIAGNOSIS — L570 Actinic keratosis: Secondary | ICD-10-CM | POA: Diagnosis present

## 2021-09-10 DIAGNOSIS — J449 Chronic obstructive pulmonary disease, unspecified: Secondary | ICD-10-CM | POA: Diagnosis not present

## 2021-09-10 DIAGNOSIS — Z66 Do not resuscitate: Secondary | ICD-10-CM | POA: Diagnosis present

## 2021-09-10 DIAGNOSIS — M19041 Primary osteoarthritis, right hand: Secondary | ICD-10-CM | POA: Diagnosis present

## 2021-09-10 DIAGNOSIS — N289 Disorder of kidney and ureter, unspecified: Secondary | ICD-10-CM | POA: Diagnosis not present

## 2021-09-10 DIAGNOSIS — N281 Cyst of kidney, acquired: Secondary | ICD-10-CM | POA: Diagnosis not present

## 2021-09-10 LAB — CBC
HCT: 35.8 % — ABNORMAL LOW (ref 36.0–46.0)
Hemoglobin: 12.1 g/dL (ref 12.0–15.0)
MCH: 33.2 pg (ref 26.0–34.0)
MCHC: 33.8 g/dL (ref 30.0–36.0)
MCV: 98.1 fL (ref 80.0–100.0)
Platelets: 234 10*3/uL (ref 150–400)
RBC: 3.65 MIL/uL — ABNORMAL LOW (ref 3.87–5.11)
RDW: 15 % (ref 11.5–15.5)
WBC: 7.7 10*3/uL (ref 4.0–10.5)
nRBC: 0 % (ref 0.0–0.2)

## 2021-09-10 LAB — HEPARIN LEVEL (UNFRACTIONATED)
Heparin Unfractionated: 0.49 IU/mL (ref 0.30–0.70)
Heparin Unfractionated: 0.45 IU/mL (ref 0.30–0.70)

## 2021-09-10 LAB — VITAMIN B12: Vitamin B-12: 1977 pg/mL — ABNORMAL HIGH (ref 180–914)

## 2021-09-10 LAB — BASIC METABOLIC PANEL
Anion gap: 9 (ref 5–15)
BUN: 25 mg/dL — ABNORMAL HIGH (ref 8–23)
CO2: 31 mmol/L (ref 22–32)
Calcium: 9.1 mg/dL (ref 8.9–10.3)
Chloride: 101 mmol/L (ref 98–111)
Creatinine, Ser: 1.21 mg/dL — ABNORMAL HIGH (ref 0.44–1.00)
GFR, Estimated: 43 mL/min — ABNORMAL LOW (ref >60)
Glucose, Bld: 122 mg/dL — ABNORMAL HIGH (ref 70–99)
Potassium: 2.9 mmol/L — ABNORMAL LOW (ref 3.5–5.1)
Sodium: 141 mmol/L (ref 135–145)

## 2021-09-10 MED ORDER — ASPIRIN 81 MG PO CHEW
81.0000 mg | CHEWABLE_TABLET | Freq: Every day | ORAL | Status: DC
  Administered 2021-09-10 – 2021-09-17 (×8): 81 mg via ORAL
  Filled 2021-09-10 (×7): qty 1

## 2021-09-10 MED ORDER — POTASSIUM CHLORIDE CRYS ER 20 MEQ PO TBCR
40.0000 meq | EXTENDED_RELEASE_TABLET | ORAL | Status: AC
  Administered 2021-09-10 (×2): 40 meq via ORAL
  Filled 2021-09-10 (×2): qty 2

## 2021-09-10 MED ORDER — PANTOPRAZOLE SODIUM 40 MG PO TBEC
40.0000 mg | DELAYED_RELEASE_TABLET | Freq: Every day | ORAL | Status: DC
  Administered 2021-09-10 – 2021-09-17 (×7): 40 mg via ORAL
  Filled 2021-09-10 (×7): qty 1

## 2021-09-10 MED ORDER — TICAGRELOR 90 MG PO TABS
90.0000 mg | ORAL_TABLET | Freq: Two times a day (BID) | ORAL | Status: DC
  Administered 2021-09-10 – 2021-09-17 (×14): 90 mg via ORAL
  Filled 2021-09-10 (×14): qty 1

## 2021-09-10 NOTE — Consult Note (Signed)
Samantha Dawson is a 87 y.o. female  [id number]  Primary Cardiologist: Neoma Laming Reason for Consultation: Chest pain and non-STEMI  HPI: This 87 year old white female who was admitted 2 weeks ago with chest pain and non-STEMI and refused cardiac catheterization in spite of repeated episodes of chest pain.  She presented back now again with chest pain shortness of breath and elevated troponin to 3-400.   Review of Systems: No orthopnea PND or leg swelling   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   Encounter for chronic pain management    Hx of smoking 04/17/2016   Hyperlipidemia    Hypertension    Macular degeneration    Osteoarthritis    Osteoporosis 03/22/2016   Primary osteoarthritis of both hands 05/03/2016    (Not in a hospital admission)     isosorbide mononitrate  30 mg Oral Daily   metoprolol tartrate  12.5 mg Oral BID   potassium chloride  40 mEq Oral Q4H   ranolazine  500 mg Oral BID   rosuvastatin  10 mg Oral Daily   umeclidinium-vilanterol  1 puff Inhalation Q0600    Infusions:  heparin 600 Units/hr (09/10/21 0533)    Allergies  Allergen Reactions   Alendronate Nausea Only    Social History   Socioeconomic History   Marital status: Widowed    Spouse name: Ihor Gully   Number of children: 3   Years of education: Not on file   Highest education level: 10th grade  Occupational History   Occupation: Retired  Tobacco Use   Smoking status: Former    Packs/day: 1.00    Years: 50.00    Pack years: 50.00    Types: Cigarettes    Quit date: 2017    Years since quitting: 5.8    Passive exposure: Never   Smokeless tobacco: Never   Tobacco comments:    smoking cesssation materials not required  Vaping Use    Vaping Use: Never used  Substance and Sexual Activity   Alcohol use: No    Alcohol/week: 0.0 standard drinks   Drug use: No   Sexual activity: Not Currently    Birth control/protection: None  Other Topics Concern   Not on file  Social History Narrative   Pt lives alone   Social Determinants of Health   Financial Resource Strain: Low Risk    Difficulty of Paying Living Expenses: Not hard at all  Food Insecurity: No Food Insecurity   Worried About Charity fundraiser in the Last Year: Never true   [place] in the Last Year: Never true  Transportation Needs: No Transportation Needs   Lack of Transportation (Medical): No   Lack of Transportation (Non-Medical): No  Physical Activity: Inactive   Days of Exercise per Week: 0 days   Minutes of Exercise per Session: 0 min  Stress: No Stress Concern Present   Feeling of Stress : Only a little  Social Connections: Moderately Isolated   Frequency of Communication with Friends and Family: More than three times a week   Frequency of Social Gatherings with Friends and Family: More than three times a week   Attends Religious Services: More than 4 times per year   Active Member of [employer]  or Organizations: No   Attends Archivist Meetings: Never   Marital Status: Widowed  Human resources officer Violence: Not At Risk   Fear of Current or Ex-Partner: No   Emotionally Abused: No   Physically Abused: No   Sexually Abused: No    Family History  Problem Relation Age of Onset   Cancer Mother    Coronary artery disease Father    Cerebrovascular Accident Father    Cancer Sister        breast cancer   Cancer Brother        stomach   Heart disease Daughter        heart vavle bypass?   Stroke Sister    Lung disease Brother    Cancer Brother        unknown   Arthritis Daughter     PHYSICAL EXAM: Vitals:   09/10/21 0644 09/10/21 0900  BP: 132/82 130/68  Pulse: 87 88  Resp: 20   Temp: 97.7 F (36.5 C)   SpO2: 98% 96%      Intake/Output Summary (Last 24 hours) at 09/10/2021 1143 Last data filed at 09/10/2021 0017 Gross per 24 hour  Intake --  Output 1100 ml  Net -1100 ml    General:  Well appearing. No respiratory difficulty HEENT: normal Neck: supple. no JVD. Carotids 2+ bilat; no bruits. No lymphadenopathy or thryomegaly appreciated. Cor: PMI nondisplaced. Regular rate & rhythm. No rubs, gallops or murmurs. Lungs: clear Abdomen: soft, nontender, nondistended. No hepatosplenomegaly. No bruits or masses. Good bowel sounds. Extremities: no cyanosis, clubbing, rash, edema Neuro: alert & oriented x 3, cranial nerves grossly intact. moves all 4 extremities w/o difficulty. Affect pleasant.  ECG: Normal sinus rhythm with ST depression inferolaterally  Results for orders placed or performed during the hospital encounter of 09/09/21 (from the past 24 hour(s))  Comprehensive metabolic panel     Status: Abnormal   Collection Time: 09/09/21  2:02 PM  Result Value Ref Range   Sodium 142 135 - 145 mmol/L   Potassium 3.8 3.5 - 5.1 mmol/L   Chloride 105 98 - 111 mmol/L   CO2 27 22 - 32 mmol/L   Glucose, Bld 175 (H) 70 - 99 mg/dL   BUN 27 (H) 8 - 23 mg/dL   Creatinine, Ser 1.14 (H) 0.44 - 1.00 mg/dL   Calcium 9.8 8.9 - 10.3 mg/dL   Total Protein 7.2 6.5 - 8.1 g/dL   Albumin 3.5 3.5 - 5.0 g/dL   AST 34 15 - 41 U/L   ALT 22 0 - 44 U/L   Alkaline Phosphatase 145 (H) 38 - 126 U/L   Total Bilirubin 1.5 (H) 0.3 - 1.2 mg/dL   GFR, Estimated 46 (L) >60 mL/min   Anion gap 10 5 - 15  Troponin I (High Sensitivity)     Status: Abnormal   Collection Time: 09/09/21  2:02 PM  Result Value Ref Range   Troponin I (High Sensitivity) 273 (HH) <18 ng/L  CBC with Differential     Status: Abnormal   Collection Time: 09/09/21  2:02 PM  Result Value Ref Range   WBC 7.9 4.0 - 10.5 K/uL   RBC 3.72 (L) 3.87 - 5.11 MIL/uL   Hemoglobin 12.1 12.0 - 15.0 g/dL   HCT 36.7 36.0 - 46.0 %   MCV 98.7 80.0 - 100.0 fL   MCH 32.5  26.0 - 34.0 pg   MCHC 33.0 30.0 - 36.0 g/dL   RDW 14.9 11.5 - 15.5 %  Platelets 246 150 - 400 K/uL   nRBC 0.0 0.0 - 0.2 %   Neutrophils Relative % 75 %   Neutro Abs 5.9 1.7 - 7.7 K/uL   Lymphocytes Relative 13 %   Lymphs Abs 1.1 0.7 - 4.0 K/uL   Monocytes Relative 10 %   Monocytes Absolute 0.8 0.1 - 1.0 K/uL   Eosinophils Relative 1 %   Eosinophils Absolute 0.1 0.0 - 0.5 K/uL   Basophils Relative 1 %   Basophils Absolute 0.0 0.0 - 0.1 K/uL   Immature Granulocytes 0 %   Abs Immature Granulocytes 0.03 0.00 - 0.07 K/uL  Brain natriuretic peptide     Status: Abnormal   Collection Time: 09/09/21  2:02 PM  Result Value Ref Range   B Natriuretic Peptide 1,905.6 (H) 0.0 - 100.0 pg/mL  Troponin I (High Sensitivity)     Status: Abnormal   Collection Time: 09/09/21  5:49 PM  Result Value Ref Range   Troponin I (High Sensitivity) 341 (HH) <18 ng/L  Urinalysis, Routine w reflex microscopic Urine, Clean Catch     Status: Abnormal   Collection Time: 09/09/21  6:51 PM  Result Value Ref Range   Color, Urine YELLOW (A) YELLOW   APPearance CLEAR (A) CLEAR   Specific Gravity, Urine 1.013 1.005 - 1.030   pH 6.0 5.0 - 8.0   Glucose, UA NEGATIVE NEGATIVE mg/dL   Hgb urine dipstick SMALL (A) NEGATIVE   Bilirubin Urine NEGATIVE NEGATIVE   Ketones, ur NEGATIVE NEGATIVE mg/dL   Protein, ur 100 (A) NEGATIVE mg/dL   Nitrite NEGATIVE NEGATIVE   Leukocytes,Ua NEGATIVE NEGATIVE   RBC / HPF 0-5 0 - 5 RBC/hpf   WBC, UA 0-5 0 - 5 WBC/hpf   Bacteria, UA NONE SEEN NONE SEEN   Squamous Epithelial / LPF 0-5 0 - 5  Resp Panel by RT-PCR (Flu A&B, Covid) Nasopharyngeal Swab     Status: None   Collection Time: 09/09/21  6:51 PM   Specimen: Nasopharyngeal Swab; Nasopharyngeal(NP) swabs in vial transport medium  Result Value Ref Range   SARS Coronavirus 2 by RT PCR NEGATIVE NEGATIVE   Influenza A by PCR NEGATIVE NEGATIVE   Influenza B by PCR NEGATIVE NEGATIVE  Vitamin B12     Status: Abnormal   Collection  Time: 09/09/21  7:56 PM  Result Value Ref Range   Vitamin B-12 1,977 (H) 180 - 914 pg/mL  Magnesium     Status: None   Collection Time: 09/09/21  7:56 PM  Result Value Ref Range   Magnesium 2.2 1.7 - 2.4 mg/dL  Phosphorus     Status: Abnormal   Collection Time: 09/09/21  7:56 PM  Result Value Ref Range   Phosphorus 2.0 (L) 2.5 - 4.6 mg/dL  Procalcitonin - Baseline     Status: None   Collection Time: 09/09/21  7:56 PM  Result Value Ref Range   Procalcitonin <0.10 ng/mL  APTT     Status: None   Collection Time: 09/09/21  7:56 PM  Result Value Ref Range   aPTT 28 24 - 36 seconds  Protime-INR     Status: None   Collection Time: 09/09/21  7:56 PM  Result Value Ref Range   Prothrombin Time 14.0 11.4 - 15.2 seconds   INR 1.1 0.8 - 1.2  Troponin I (High Sensitivity)     Status: Abnormal   Collection Time: 09/09/21  7:56 PM  Result Value Ref Range   Troponin I (High Sensitivity) 284 (HH) <18 ng/L  Basic metabolic panel     Status: Abnormal   Collection Time: 09/10/21  6:47 AM  Result Value Ref Range   Sodium 141 135 - 145 mmol/L   Potassium 2.9 (L) 3.5 - 5.1 mmol/L   Chloride 101 98 - 111 mmol/L   CO2 31 22 - 32 mmol/L   Glucose, Bld 122 (H) 70 - 99 mg/dL   BUN 25 (H) 8 - 23 mg/dL   Creatinine, Ser 1.21 (H) 0.44 - 1.00 mg/dL   Calcium 9.1 8.9 - 10.3 mg/dL   GFR, Estimated 43 (L) >60 mL/min   Anion gap 9 5 - 15  CBC     Status: Abnormal   Collection Time: 09/10/21  6:47 AM  Result Value Ref Range   WBC 7.7 4.0 - 10.5 K/uL   RBC 3.65 (L) 3.87 - 5.11 MIL/uL   Hemoglobin 12.1 12.0 - 15.0 g/dL   HCT 35.8 (L) 36.0 - 46.0 %   MCV 98.1 80.0 - 100.0 fL   MCH 33.2 26.0 - 34.0 pg   MCHC 33.8 30.0 - 36.0 g/dL   RDW 15.0 11.5 - 15.5 %   Platelets 234 150 - 400 K/uL   nRBC 0.0 0.0 - 0.2 %  Heparin level (unfractionated)     Status: None   Collection Time: 09/10/21  6:47 AM  Result Value Ref Range   Heparin Unfractionated 0.49 0.30 - 0.70 IU/mL   DG Chest 1 View  Result Date:  09/09/2021 CLINICAL DATA:  Increasing shortness of breath, coronary artery disease, recent MI 3 weeks ago, pedal edema, COPD, hypertension EXAM: CHEST  1 VIEW COMPARISON:  Portable exam 1429 hours compared 08/22/2021 FINDINGS: Enlargement of cardiac silhouette with pulmonary vascular congestion. Atherosclerotic calcification aorta. Interstitial infiltrates in both lungs greatest at bases with coexistence bibasilar effusions and atelectasis. Findings favor CHF. Suspect underlying emphysematous changes. No pneumothorax. Bones demineralized. IMPRESSION: Enlargement of cardiac silhouette with pulmonary vascular congestion and probable pulmonary edema/CHF. Small bibasilar pleural effusions and atelectasis. Underlying emphysematous changes. Aortic Atherosclerosis (ICD10-I70.0) and Emphysema (ICD10-J43.9). Electronically Signed   By: Lavonia Dana M.D.   On: 09/09/2021 14:44     ASSESSMENT AND PLAN: Non-STEMI with chest pain and shortness of breath.  Patient refused cardiac catheterization 2 weeks ago in spite of repeated episodes of chest pain because she had a stroke in the past from cardiac catheterization 20 years ago.  Patient now having a lot of symptoms and I talked to the daughter and the patient at length and have agreed finally to cardiac catheterization knowing the risk and benefits.  Cardiac catheterization will be set up Monday at 7:30 AM.  Eron Goble A

## 2021-09-10 NOTE — ED Notes (Signed)
Physical therapy at bedside

## 2021-09-10 NOTE — Consult Note (Signed)
ANTICOAGULATION CONSULT NOTE  Pharmacy Consult for Heparin gtt Indication: chest pain/ACS  Patient Measurements: Height: 5' (152.4 cm) Weight: 46.3 kg (102 lb) IBW/kg (Calculated) : 45.5 Heparin Dosing Weight: 46.3kg  Labs: Recent Labs    09/09/21 1402 09/09/21 1749 09/09/21 1956 09/10/21 0647 09/10/21 1602  HGB 12.1  --   --  12.1  --   HCT 36.7  --   --  35.8*  --   PLT 246  --   --  234  --   APTT  --   --  28  --   --   LABPROT  --   --  14.0  --   --   INR  --   --  1.1  --   --   HEPARINUNFRC  --   --   --  0.49 0.45  CREATININE 1.14*  --   --  1.21*  --   TROPONINIHS 273* 341* 284*  --   --      Estimated Creatinine Clearance: 22.6 mL/min (A) (by C-G formula based on SCr of 1.21 mg/dL (H)).   Medications:  No AC upon chart review.  Assessment: 87yo Female w/ h/o COPD (on 2L chronic), HTN, mixed sys/diaCHF, HLD, CAD, CKD3 osteoporosis, presented with SOB x4 days & b/l LE edema ISO recent history of MI 3wks prior (on 08/18/2021) but opted not to undergo LHC at that time. Pharmacy consulted for mgmt of heparin gtt.  Baseline Labs: aPTT - 28 INR - 1.1 Hgb - 12.1 Plts - 246 Troponin: 273>341  Date Time HL Rate/Comment 1105 0647 0.49 Thera; 600 un/hr  1105 1602 0.45 Thera; 600 un/hr   Goal of Therapy:  Heparin level 0.3-0.7 units/ml Monitor platelets by anticoagulation protocol: Yes   Plan:  Heparin level is therapeutic x 2 Continue heparin infusion at 600 units/hr Re-check anti-Xa level with AM labs Continue to monitor H&H and platelets  Brody Kump O Anntoinette Haefele  09/10/2021,4:42 PM

## 2021-09-10 NOTE — Consult Note (Signed)
ANTICOAGULATION CONSULT NOTE  Pharmacy Consult for Heparin gtt Indication: chest pain/ACS  Patient Measurements: Height: 5' (152.4 cm) Weight: 46.3 kg (102 lb) IBW/kg (Calculated) : 45.5 Heparin Dosing Weight: 46.3kg  Labs: Recent Labs    09/09/21 1402 09/09/21 1749 09/09/21 1956 09/10/21 0647  HGB 12.1  --   --  12.1  HCT 36.7  --   --  35.8*  PLT 246  --   --  234  APTT  --   --  28  --   LABPROT  --   --  14.0  --   INR  --   --  1.1  --   HEPARINUNFRC  --   --   --  0.49  CREATININE 1.14*  --   --  1.21*  TROPONINIHS 273* 341* 284*  --      Estimated Creatinine Clearance: 22.6 mL/min (A) (by C-G formula based on SCr of 1.21 mg/dL (H)).   Medications:  Heparin Dosing Weight: 46.3kg PTA: ASA & brilinta. No AC upon chart review. Inpatient: +Heparin gtt  Assessment: 87yo Female w/ h/o COPD (on 2L chronic), HTN, mixed sys/diaCHF, HLD, CAD, CKD3 osteoporosis, presented with SOB x4 days & b/l LE edema ISO recent history of MI 3wks prior (on 08/18/2021) but opted not to undergo LHC at that time. Pharmacy consulted for mgmt of heparin gtt.  Date Time aPTT/HL Rate/Comment 1105 0647 ------/0.49 Thera; 600 un/hr    Baseline Labs: aPTT - 28 INR - 1.1 Hgb - 12.1 Plts - 246 Troponin: 273>341  Goal of Therapy:  Heparin level 0.3-0.7 units/ml Monitor platelets by anticoagulation protocol: Yes   Plan:  Heparin level is therapeutic x 1 Continue heparin infusion at 600 units/hr Re-check confirmatory anti-Xa level in 8 hours Continue to monitor H&H and platelets  Benita Gutter  09/10/2021,7:33 AM

## 2021-09-10 NOTE — ED Notes (Signed)
Pt resting on stretcher watching television. Heparin infusing without difficulty. Pt reports she feels that her oxygen is not running effectively and she can not feel it coming out of her nasal cannula. Long section of oxygen tubing removed and pt reports improvement. Provided for pt comfort and safety and will continue to assess.

## 2021-09-10 NOTE — Progress Notes (Addendum)
PROGRESS NOTE    TAMATHA GADBOIS  ZOX:[id number] DOB: 04-May-1929 DOA: 09/09/2021 PCP: Steele Sizer, MD    Chief Complaint  Patient presents with   Shortness of Breath    Brief Narrative:  Ms. Devos is an 87 year old pleasant, AAOx4, female with CAD, combined heart failure, history of hypertension, history of tobacco use, emphysema, who is being admitted for presumed combined heart failure exacerbation, complicated by NSTEMI.  Assessment & Plan:   Principal Problem:   NSTEMI (non-ST elevated myocardial infarction) ([place]) Active Problems:   Actinic keratosis   Hyperlipidemia   Essential hypertension   Panlobular emphysema (HCC)   Hx of smoking   Primary osteoarthritis of both hands   Acid reflux   Senile purpura (HCC)   Nocturnal hypoxemia due to emphysema (HCC)   Acute systolic CHF (congestive heart failure) (HCC)  # Acute Hypoxic Respiratory Failure  HFrEF Exacerbation  Shortness of breath - wean O2 as tolerated -- CXR with enlargement of cardiac silhouette with pulm vascular congestion and probably pulm edema/CHF - small bibasilar effusions/atelectasis  -- continue lasix as tolerated, follow daily  - Strict I's and O's, daily weights   # NSTEMI  CAD - demand 2/2 HF vs NSTEMI -- troponin peaked at 341 -- continue heparin GTT, aspirin -- continue imdur, metoprolol, ranexa, crestor, nitro sl -- brilinta on hold --> resume per discussion with Dr. Humphrey Rolls -- cardiology planning for cath on monday  # Acute kidney injury-presumed secondary to cardiorenal - Serum creatinine on presentation is 1.14, GFR 46 - Previous serum creatinine is 0.86, GFR greater than 60 - follow with diuresis  # Hyperlipidemia-rosuvastatin 20 mg daily   # Emphysema-maintenance inhalers have been resumed   # Debility-difficulty ambulating - Daughter, [name], healthcare power of attorney is requesting Jerrion Tabbert wheelchair - Physical therapy has been consulted - Transition of care managers have been  consulted for this wheelchair  DVT prophylaxis: heparin gtt Code Status: DNR Family Communication: none at bedside Disposition:   Status is: Observation  The patient will require care spanning > 2 midnights and should be moved to inpatient because: cardiology consult, planning cath       Consultants:  cardiology  Procedures:  none  Antimicrobials:  Anti-infectives (From admission, onward)    None          Subjective: Denies pain  Objective: Vitals:   09/10/21 0644 09/10/21 0900 09/10/21 1200 09/10/21 1300  BP: 132/82 130/68 (!) 110/57 (!) 95/59  Pulse: 87 88 81 71  Resp: 20  20   Temp: 97.7 F (36.5 C)   98 F (36.7 C)  TempSrc: Axillary   Oral  SpO2: 98% 96% 93% 97%  Weight:      Height:        Intake/Output Summary (Last 24 hours) at 09/10/2021 1339 Last data filed at 09/10/2021 1300 Gross per 24 hour  Intake --  Output 1900 ml  Net -1900 ml   Filed Weights   09/09/21 1400  Weight: 46.3 kg    Examination:  General exam: Appears calm and comfortable. Thin elderly woman, sleeping soundly. Respiratory system: Clear to auscultation. Respiratory effort normal. Cardiovascular system: S1 & S2 heard, RRR Gastrointestinal system: Abdomen is nondistended, soft and nontender.  Central nervous system: Alert and oriented. No focal neurological deficits. Extremities: no sig LE edema Psychiatry: Judgement and insight appear normal. Mood & affect appropriate.     Data Reviewed: I have personally reviewed following labs and imaging studies  CBC: Recent Labs  Lab 09/09/21  1402 09/10/21 0647  WBC 7.9 7.7  NEUTROABS 5.9  --   HGB 12.1 12.1  HCT 36.7 35.8*  MCV 98.7 98.1  PLT 246 654    Basic Metabolic Panel: Recent Labs  Lab 09/09/21 1402 09/09/21 1956 09/10/21 0647  NA 142  --  141  K 3.8  --  2.9*  CL 105  --  101  CO2 27  --  31  GLUCOSE 175*  --  122*  BUN 27*  --  25*  CREATININE 1.14*  --  1.21*  CALCIUM 9.8  --  9.1  MG  --  2.2   --   PHOS  --  2.0*  --     GFR: Estimated Creatinine Clearance: 22.6 mL/min (Ayiden Milliman) (by C-G formula based on SCr of 1.21 mg/dL (H)).  Liver Function Tests: Recent Labs  Lab 09/09/21 1402  AST 34  ALT 22  ALKPHOS 145*  BILITOT 1.5*  PROT 7.2  ALBUMIN 3.5    CBG: No results for input(s): GLUCAP in the last 168 hours.   Recent Results (from the past 240 hour(s))  Resp Panel by RT-PCR (Flu Jerred Zaremba&B, Covid) Nasopharyngeal Swab     Status: None   Collection Time: 09/09/21  6:51 PM   Specimen: Nasopharyngeal Swab; Nasopharyngeal(NP) swabs in vial transport medium  Result Value Ref Range Status   SARS Coronavirus 2 by RT PCR NEGATIVE NEGATIVE Final    Comment: (NOTE) SARS-CoV-2 target nucleic acids are NOT DETECTED.  The SARS-CoV-2 RNA is generally detectable in upper respiratory specimens during the acute phase of infection. The lowest concentration of SARS-CoV-2 viral copies this assay can detect is 138 copies/mL. Avanthika Dehnert negative result does not preclude SARS-Cov-2 infection and should not be used as the sole basis for treatment or other patient management decisions. Terry Abila negative result may occur with  improper specimen collection/handling, submission of specimen other than nasopharyngeal swab, presence of viral mutation(s) within the areas targeted by this assay, and inadequate number of viral copies(<138 copies/mL). Quanah Majka negative result must be combined with clinical observations, patient history, and epidemiological information. The expected result is Negative.  Fact Sheet for Patients:  EntrepreneurPulse.com.au  Fact Sheet for Healthcare Providers:  IncredibleEmployment.be  This test is no t yet approved or cleared by the [place] FDA and  has been authorized for detection and/or diagnosis of SARS-CoV-2 by FDA under an Emergency Use Authorization (EUA). This EUA will remain  in effect (meaning this test can be used) for the duration of  the COVID-19 declaration under Section 564(b)(1) of the Act, 21 U.S.C.section 360bbb-3(b)(1), unless the authorization is terminated  or revoked sooner.       Influenza Rashard Ryle by PCR NEGATIVE NEGATIVE Final   Influenza B by PCR NEGATIVE NEGATIVE Final    Comment: (NOTE) The Xpert Xpress SARS-CoV-2/FLU/RSV plus assay is intended as an aid in the diagnosis of influenza from Nasopharyngeal swab specimens and should not be used as Allanah Mcfarland sole basis for treatment. Nasal washings and aspirates are unacceptable for Xpert Xpress SARS-CoV-2/FLU/RSV testing.  Fact Sheet for Patients: EntrepreneurPulse.com.au  Fact Sheet for Healthcare Providers: IncredibleEmployment.be  This test is not yet approved or cleared by the [place] FDA and has been authorized for detection and/or diagnosis of SARS-CoV-2 by FDA under an Emergency Use Authorization (EUA). This EUA will remain in effect (meaning this test can be used) for the duration of the COVID-19 declaration under Section 564(b)(1) of the Act, 21 U.S.C. section 360bbb-3(b)(1), unless the authorization is terminated  or revoked.  Performed at [facility], [address] [place], [place] [address]          Radiology Studies: DG Chest 1 View  Result Date: 09/09/2021 CLINICAL DATA:  Increasing shortness of breath, coronary artery disease, recent MI 3 weeks ago, pedal edema, COPD, hypertension EXAM: CHEST  1 VIEW COMPARISON:  Portable exam 1429 hours compared 08/22/2021 FINDINGS: Enlargement of cardiac silhouette with pulmonary vascular congestion. Atherosclerotic calcification aorta. Interstitial infiltrates in both lungs greatest at bases with coexistence bibasilar effusions and atelectasis. Findings favor CHF. Suspect underlying emphysematous changes. No pneumothorax. Bones demineralized. IMPRESSION: Enlargement of cardiac silhouette with pulmonary vascular congestion and probable pulmonary edema/CHF.  Small bibasilar pleural effusions and atelectasis. Underlying emphysematous changes. Aortic Atherosclerosis (ICD10-I70.0) and Emphysema (ICD10-J43.9). Electronically Signed   By: Lavonia Dana M.D.   On: 09/09/2021 14:44        Scheduled Meds:  isosorbide mononitrate  30 mg Oral Daily   metoprolol tartrate  12.5 mg Oral BID   potassium chloride  40 mEq Oral Q4H   ranolazine  500 mg Oral BID   rosuvastatin  10 mg Oral Daily   umeclidinium-vilanterol  1 puff Inhalation Q0600   Continuous Infusions:  heparin 600 Units/hr (09/10/21 0533)     LOS: 0 days    Time spent: over 30 min    Fayrene Helper, MD [facility]   To contact the attending provider between 7A-7P or the covering provider during after hours 7P-7A, please log into the web site [email or web address] and access using universal [place] password for that web site. If you do not have the password, please call the hospital operator.  09/10/2021, 1:39 PM

## 2021-09-10 NOTE — ED Notes (Signed)
349mL of urine output in external canister

## 2021-09-10 NOTE — Evaluation (Addendum)
Physical Therapy Evaluation Patient Details Name: Samantha Dawson MRN: [id number] DOB: [birth year] Today's Date: 09/10/2021  History of Present Illness  Pt is an 87 y/o F admitted on 09/09/21 with c/c of SOB. Pt is being treated for suspected combined heart failure exacerbation. Of note, pt was recently admitted & declined cardiac catheterization. Cardiology was consulted for this admission 2/2 NTSEMI & chest pain & at this time pt is agreeable to cardiac catheterization. PMH: CAD, HTN, heart failure reduced EF, OA, pan lobar emphysema, DDD, tobacco use, GERD, nonmelanoma skin CA  Clinical Impression  Cardiology cleared pt for PT evaluation via secure chat. Pt seen for PT evaluation with daughter present providing PLOF & home set up information. Pt is mod I with household ambulation with RW with daughter prepping simple meals for her to have while they are at work during the day. Pt does live alone but daughters assist & spend the night PRN. On this date pt is able to perform bed mobility with supervision and transfer to recliner with CGA & RW. Pt ambulates 10 ft + 20 ft with RW & supervision with decreased gait speed. SpO2 stays >/= 90% but drops as low as 86% once sitting in recliner after gait; pt requires extra time & cuing for pursed lip breathing to increase to >/=90%. Pt would benefit from ongoing acute PT services to progress gait with LRAD, endurance/activity tolerance & balance. Would recommend pt return home with heavy supervision upon d/c.        Recommendations for follow up therapy are one component of a multi-disciplinary discharge planning process, led by the attending physician.  Recommendations may be updated based on patient status, additional functional criteria and insurance authorization.  Follow Up Recommendations Home health PT    Assistance Recommended at Discharge Frequent or constant Supervision/Assistance  Functional Status Assessment  (Pt has had a minimal decline in their  functional status)  Equipment Recommendations  None recommended by PT    Recommendations for Other Services       Precautions / Restrictions Precautions Precautions: Fall Restrictions Weight Bearing Restrictions: No      Mobility  Bed Mobility Overal bed mobility: Needs Assistance Bed Mobility: Supine to Sit     Supine to sit: Supervision;HOB elevated          Transfers Overall transfer level: Needs assistance Equipment used: Rolling walker (2 wheels) Transfers: Sit to/from [employer] Transfers Sit to Stand: Min guard;From elevated surface Stand pivot transfers: Min guard              Ambulation/Gait Ambulation/Gait assistance: Min guard;Supervision (CGA fade to supervision) Gait Distance (Feet):  (10 ft + 20 ft) Assistive device: Rolling walker (2 wheels) Gait Pattern/deviations: Decreased stride length;Decreased step length - right;Decreased step length - left Gait velocity: decreased      Stairs            Wheelchair Mobility    Modified Rankin (Stroke Patients Only)       Balance Overall balance assessment: Needs assistance Sitting-balance support: Bilateral upper extremity supported;Feet supported Sitting balance-Leahy Scale: Good Sitting balance - Comments: close supervision static sitting EOB   Standing balance support: Bilateral upper extremity supported;During functional activity Standing balance-Leahy Scale: Fair Standing balance comment: BUE support on RW & CGA<>supervision                             Pertinent Vitals/Pain Pain Assessment: No/denies pain    Home  Living Family/patient expects to be discharged to:: Private residence Living Arrangements: Alone Available Help at Discharge: Family;Available PRN/intermittently Type of Home: House Home Access: Stairs to enter Entrance Stairs-Rails: Right;Left;Can reach both Entrance Stairs-Number of Steps: 1   Home Layout: One level Home Equipment: Chartered certified accountant (2 wheels)      Prior Function Prior Level of Function : Needs assist             Mobility Comments: Pt was mod I with ambulation in the home with RW. No falls in the past 6 months. ADLs Comments: Pt's daughters provided simple meals for the pt during the day while they are at work but come over to assist in the evenings & spend the night PRN.     Hand Dominance        Extremity/Trunk Assessment   Upper Extremity Assessment Upper Extremity Assessment: Generalized weakness    Lower Extremity Assessment Lower Extremity Assessment: Generalized weakness    Cervical / Trunk Assessment Cervical / Trunk Assessment: Kyphotic  Communication   Communication: HOH  Cognition Arousal/Alertness: Awake/alert Behavior During Therapy: WFL for tasks assessed/performed Overall Cognitive Status: Within Functional Limits for tasks assessed                                 General Comments: sweet lady        General Comments      Exercises General Exercises - Lower Extremity Heel Slides: AROM;Strengthening;Both;10 reps Hip ABduction/ADduction: AROM;Strengthening;Both;10 reps (hip abduction slides) Straight Leg Raises: AROM;Strengthening;Both;10 reps   Assessment/Plan    PT Assessment Patient needs continued PT services  PT Problem List Decreased strength;Decreased mobility;Decreased activity tolerance;Cardiopulmonary status limiting activity;Decreased balance       PT Treatment Interventions DME instruction;Therapeutic activities;Gait training;Therapeutic exercise;Patient/family education;Stair training;Balance training;Functional mobility training;Neuromuscular re-education    PT Goals (Current goals can be found in the Care Plan section)  Acute Rehab PT Goals Patient Stated Goal: get better PT Goal Formulation: With patient Time For Goal Achievement: 09/24/21 Potential to Achieve Goals: Good    Frequency Min 2X/week   Barriers to discharge  Decreased caregiver support      Co-evaluation               AM-PAC PT "6 Clicks" Mobility  Outcome Measure Help needed turning from your back to your side while in a flat bed without using bedrails?: None Help needed moving from lying on your back to sitting on the side of a flat bed without using bedrails?: A Little Help needed moving to and from a bed to a chair (including a wheelchair)?: A Little Help needed standing up from a chair using your arms (e.g., wheelchair or bedside chair)?: A Little Help needed to walk in hospital room?: A Little Help needed climbing 3-5 steps with a railing? : A Lot 6 Click Score: 18    End of Session Equipment Utilized During Treatment: Gait belt;Oxygen Activity Tolerance: Patient tolerated treatment well Patient left: in chair;with call bell/phone within reach Nurse Communication: Mobility status (O2) PT Visit Diagnosis: Unsteadiness on feet (R26.81);Muscle weakness (generalized) (M62.81);Difficulty in walking, not elsewhere classified (R26.2)    Time: 4270-6237 PT Time Calculation (min) (ACUTE ONLY): 25 min   Charges:   PT Evaluation $PT Eval Moderate Complexity: 1 Mod PT Treatments $Therapeutic Activity: 8-22 mins        Lavone Nian, PT, DPT 09/10/21, 2:13 PM   Waunita Schooner 09/10/2021, 2:11 PM

## 2021-09-10 NOTE — ED Notes (Signed)
Pt repositioned in bed.

## 2021-09-10 NOTE — ED Notes (Signed)
Pt sleeping, no distress noted. Pt has Urine output 1169ml

## 2021-09-10 NOTE — ED Notes (Signed)
Pt given water per request

## 2021-09-10 NOTE — ED Notes (Signed)
Chux cleaned of urine

## 2021-09-11 ENCOUNTER — Inpatient Hospital Stay: Payer: Medicare Other

## 2021-09-11 DIAGNOSIS — I214 Non-ST elevation (NSTEMI) myocardial infarction: Secondary | ICD-10-CM | POA: Diagnosis not present

## 2021-09-11 LAB — CBC WITH DIFFERENTIAL/PLATELET
Abs Immature Granulocytes: 0.04 K/uL (ref 0.00–0.07)
Basophils Absolute: 0.1 K/uL (ref 0.0–0.1)
Basophils Relative: 1 %
Eosinophils Absolute: 0.2 K/uL (ref 0.0–0.5)
Eosinophils Relative: 2 %
HCT: 38.7 % (ref 36.0–46.0)
Hemoglobin: 13.3 g/dL (ref 12.0–15.0)
Immature Granulocytes: 0 %
Lymphocytes Relative: 21 %
Lymphs Abs: 2 K/uL (ref 0.7–4.0)
MCH: 33.3 pg (ref 26.0–34.0)
MCHC: 34.4 g/dL (ref 30.0–36.0)
MCV: 97 fL (ref 80.0–100.0)
Monocytes Absolute: 1 K/uL (ref 0.1–1.0)
Monocytes Relative: 11 %
Neutro Abs: 6.1 K/uL (ref 1.7–7.7)
Neutrophils Relative %: 65 %
Platelets: 272 K/uL (ref 150–400)
RBC: 3.99 MIL/uL (ref 3.87–5.11)
RDW: 15.1 % (ref 11.5–15.5)
WBC: 9.4 K/uL (ref 4.0–10.5)
nRBC: 0 % (ref 0.0–0.2)

## 2021-09-11 LAB — COMPREHENSIVE METABOLIC PANEL
ALT: 22 U/L (ref 0–44)
AST: 36 U/L (ref 15–41)
Albumin: 3.6 g/dL (ref 3.5–5.0)
Alkaline Phosphatase: 143 U/L — ABNORMAL HIGH (ref 38–126)
Anion gap: 11 (ref 5–15)
BUN: 25 mg/dL — ABNORMAL HIGH (ref 8–23)
CO2: 27 mmol/L (ref 22–32)
Calcium: 9.2 mg/dL (ref 8.9–10.3)
Chloride: 102 mmol/L (ref 98–111)
Creatinine, Ser: 1.33 mg/dL — ABNORMAL HIGH (ref 0.44–1.00)
GFR, Estimated: 38 mL/min — ABNORMAL LOW (ref >60)
Glucose, Bld: 152 mg/dL — ABNORMAL HIGH (ref 70–99)
Potassium: 3.5 mmol/L (ref 3.5–5.1)
Sodium: 140 mmol/L (ref 135–145)
Total Bilirubin: 1.9 mg/dL — ABNORMAL HIGH (ref 0.3–1.2)
Total Protein: 7.7 g/dL (ref 6.5–8.1)

## 2021-09-11 LAB — PHOSPHORUS: Phosphorus: 2.3 mg/dL — ABNORMAL LOW (ref 2.5–4.6)

## 2021-09-11 LAB — HEPARIN LEVEL (UNFRACTIONATED): Heparin Unfractionated: 0.48 [IU]/mL (ref 0.30–0.70)

## 2021-09-11 LAB — MAGNESIUM: Magnesium: 2.2 mg/dL (ref 1.7–2.4)

## 2021-09-11 MED ORDER — FUROSEMIDE 10 MG/ML IJ SOLN
40.0000 mg | Freq: Once | INTRAMUSCULAR | Status: AC
Start: 2021-09-11 — End: 2021-09-11
  Administered 2021-09-11: 40 mg via INTRAVENOUS
  Filled 2021-09-11: qty 4

## 2021-09-11 MED ORDER — RANOLAZINE ER 500 MG PO TB12
500.0000 mg | ORAL_TABLET | Freq: Every day | ORAL | Status: DC
  Administered 2021-09-11: 500 mg via ORAL
  Filled 2021-09-11: qty 1

## 2021-09-11 MED ORDER — SODIUM CHLORIDE 0.9% FLUSH
3.0000 mL | Freq: Two times a day (BID) | INTRAVENOUS | Status: DC
  Administered 2021-09-11 – 2021-09-16 (×5): 3 mL via INTRAVENOUS

## 2021-09-11 NOTE — ED Notes (Signed)
Resp. Therapist at the bedside to place pt on bi-pap. Pt tolerating well.

## 2021-09-11 NOTE — ED Notes (Addendum)
Pt had sudden onset of wheezing and sob. Pt used her inhaler at the bedside X2 at onset of symptoms. Currently on 3L via Nasal canula.

## 2021-09-11 NOTE — ED Notes (Addendum)
Sharion Settler NP at the bedside for pt evaluation. Pt had no improvement after initial breathing tx. Second ordered by NP.

## 2021-09-11 NOTE — Progress Notes (Addendum)
PROGRESS NOTE    MERRILEE ANCONA  YKZ:[id number] DOB: April 19, 1929 DOA: 09/09/2021 PCP: Steele Sizer, MD    Chief Complaint  Patient presents with   Shortness of Breath    Brief Narrative:  Ms. Armes is an 87 year old pleasant, AAOx4, female with CAD, combined heart failure, history of hypertension, history of tobacco use, emphysema, who is being admitted for presumed combined heart failure exacerbation, complicated by NSTEMI.  Assessment & Plan:   Principal Problem:   NSTEMI (non-ST elevated myocardial infarction) ([place]) Active Problems:   Actinic keratosis   Hyperlipidemia   Essential hypertension   Panlobular emphysema (HCC)   Hx of smoking   Primary osteoarthritis of both hands   Acid reflux   Senile purpura (HCC)   Nocturnal hypoxemia due to emphysema (HCC)   Acute systolic CHF (congestive heart failure) (HCC)   Elevated troponin  # Acute Hypoxic Respiratory Failure  HFrEF Exacerbation  Shortness of breath - wean O2 as tolerated -- CXR with enlargement of cardiac silhouette with pulm vascular congestion and probably pulm edema/CHF - small bibasilar effusions/atelectasis  -- repeat CXR with faint hazy opacities in the upper lungs bilaterally R>L - pneumonia or mild pulm edema, small bilateral effusions, streaky and raticular bibasilar opacities (no fever, no white count, continue to treat as HF) - worsening SOB last night, she was placed on bipap this AM - appears comfortable and as though she could come off bipap now, will discuss with RN/respiratory  -- continue lasix as tolerated, follow daily (received dose around 6 this morning) - Strict I's and O's, daily weights   # NSTEMI  CAD - demand 2/2 HF vs NSTEMI -- troponin peaked at 341 -- continue heparin GTT, aspirin -- continue imdur, metoprolol, ranexa, crestor, nitro sl -- brilinta on hold --> resume per discussion with Dr. Humphrey Rolls -- cardiology planning for cath on monday  # Acute kidney injury --baseline  creatinine <1, creatinine up to 1.33 today --in setting of HF and diuresis, will follow closely with diuresis -UA bland --renal US   # Hyperlipidemia-rosuvastatin 20 mg daily   # Emphysema-maintenance inhalers have been resumed   # Debility-difficulty ambulating - Daughter, [name], healthcare power of attorney is requesting Leahmarie Gasiorowski wheelchair - Physical therapy has been consulted - Transition of care managers have been consulted for this wheelchair  DVT prophylaxis: heparin gtt Code Status: DNR Family Communication: none at bedside - called daughter, no answer Disposition:   Status is: Observation  The patient will require care spanning > 2 midnights and should be moved to inpatient because: cardiology consult, planning cath       Consultants:  cardiology  Procedures:  none  Antimicrobials:  Anti-infectives (From admission, onward)    None          Subjective: Asking about getting the bipap off  Objective: Vitals:   09/11/21 0515 09/11/21 0622 09/11/21 0700 09/11/21 0800  BP: 138/86 (!) 145/98 121/68   Pulse: (!) 117 (!) 144 (!) 120 (!) 114  Resp: 20 (!) 25 19 19   Temp:      TempSrc:      SpO2: 95% 94% 93% 95%  Weight:      Height:        Intake/Output Summary (Last 24 hours) at 09/11/2021 0831 Last data filed at 09/10/2021 1300 Gross per 24 hour  Intake --  Output 800 ml  Net -800 ml   Filed Weights   09/09/21 1400  Weight: 46.3 kg    Examination:  General: No  acute distress. Cardiovascular: RRR Lungs: appears comfortable on bipap, no clear adventitious lung sounds with limitations of excess noise from bipap Abdomen: Soft, nontender, nondistended  Neurological: Alert and oriented 3. Moves all extremities 4 . Cranial nerves II through XII grossly intact. Skin: Warm and dry. No rashes or lesions. Extremities: No clubbing or cyanosis. No edema.   Data Reviewed: I have personally reviewed following labs and imaging studies  CBC: Recent Labs   Lab 09/09/21 1402 09/10/21 0647 09/11/21 0532  WBC 7.9 7.7 9.4  NEUTROABS 5.9  --  6.1  HGB 12.1 12.1 13.3  HCT 36.7 35.8* 38.7  MCV 98.7 98.1 97.0  PLT 246 234 025    Basic Metabolic Panel: Recent Labs  Lab 09/09/21 1402 09/09/21 1956 09/10/21 0647 09/11/21 0532  NA 142  --  141 140  K 3.8  --  2.9* 3.5  CL 105  --  101 102  CO2 27  --  31 27  GLUCOSE 175*  --  122* 152*  BUN 27*  --  25* 25*  CREATININE 1.14*  --  1.21* 1.33*  CALCIUM 9.8  --  9.1 9.2  MG  --  2.2  --  2.2  PHOS  --  2.0*  --  2.3*    GFR: Estimated Creatinine Clearance: 20.6 mL/min (Srihan Brutus) (by C-G formula based on SCr of 1.33 mg/dL (H)).  Liver Function Tests: Recent Labs  Lab 09/09/21 1402 09/11/21 0532  AST 34 36  ALT 22 22  ALKPHOS 145* 143*  BILITOT 1.5* 1.9*  PROT 7.2 7.7  ALBUMIN 3.5 3.6    CBG: No results for input(s): GLUCAP in the last 168 hours.   Recent Results (from the past 240 hour(s))  Resp Panel by RT-PCR (Flu Jannice Beitzel&B, Covid) Nasopharyngeal Swab     Status: None   Collection Time: 09/09/21  6:51 PM   Specimen: Nasopharyngeal Swab; Nasopharyngeal(NP) swabs in vial transport medium  Result Value Ref Range Status   SARS Coronavirus 2 by RT PCR NEGATIVE NEGATIVE Final    Comment: (NOTE) SARS-CoV-2 target nucleic acids are NOT DETECTED.  The SARS-CoV-2 RNA is generally detectable in upper respiratory specimens during the acute phase of infection. The lowest concentration of SARS-CoV-2 viral copies this assay can detect is 138 copies/mL. Shenea Giacobbe negative result does not preclude SARS-Cov-2 infection and should not be used as the sole basis for treatment or other patient management decisions. Matelyn Antonelli negative result may occur with  improper specimen collection/handling, submission of specimen other than nasopharyngeal swab, presence of viral mutation(s) within the areas targeted by this assay, and inadequate number of viral copies(<138 copies/mL). Marlee Armenteros negative result must be combined  with clinical observations, patient history, and epidemiological information. The expected result is Negative.  Fact Sheet for Patients:  EntrepreneurPulse.com.au  Fact Sheet for Healthcare Providers:  IncredibleEmployment.be  This test is no t yet approved or cleared by the [place] FDA and  has been authorized for detection and/or diagnosis of SARS-CoV-2 by FDA under an Emergency Use Authorization (EUA). This EUA will remain  in effect (meaning this test can be used) for the duration of the COVID-19 declaration under Section 564(b)(1) of the Act, 21 U.S.C.section 360bbb-3(b)(1), unless the authorization is terminated  or revoked sooner.       Influenza Jaydon Soroka by PCR NEGATIVE NEGATIVE Final   Influenza B by PCR NEGATIVE NEGATIVE Final    Comment: (NOTE) The Xpert Xpress SARS-CoV-2/FLU/RSV plus assay is intended as an aid in the diagnosis of  influenza from Nasopharyngeal swab specimens and should not be used as [place] sole basis for treatment. Nasal washings and aspirates are unacceptable for Xpert Xpress SARS-CoV-2/FLU/RSV testing.  Fact Sheet for Patients: EntrepreneurPulse.com.au  Fact Sheet for Healthcare Providers: IncredibleEmployment.be  This test is not yet approved or cleared by the [place] FDA and has been authorized for detection and/or diagnosis of SARS-CoV-2 by FDA under an Emergency Use Authorization (EUA). This EUA will remain in effect (meaning this test can be used) for the duration of the COVID-19 declaration under Section 564(b)(1) of the Act, 21 U.S.C. section 360bbb-3(b)(1), unless the authorization is terminated or revoked.  Performed at [facility], [address] [place], [place] [address]          Radiology Studies: DG Chest 1 View  Result Date: 09/09/2021 CLINICAL DATA:  Increasing shortness of breath, coronary artery disease, recent MI 3 weeks ago, pedal  edema, COPD, hypertension EXAM: CHEST  1 VIEW COMPARISON:  Portable exam 1429 hours compared 08/22/2021 FINDINGS: Enlargement of cardiac silhouette with pulmonary vascular congestion. Atherosclerotic calcification aorta. Interstitial infiltrates in both lungs greatest at bases with coexistence bibasilar effusions and atelectasis. Findings favor CHF. Suspect underlying emphysematous changes. No pneumothorax. Bones demineralized. IMPRESSION: Enlargement of cardiac silhouette with pulmonary vascular congestion and probable pulmonary edema/CHF. Small bibasilar pleural effusions and atelectasis. Underlying emphysematous changes. Aortic Atherosclerosis (ICD10-I70.0) and Emphysema (ICD10-J43.9). Electronically Signed   By: Lavonia Dana M.D.   On: 09/09/2021 14:44   DG Chest Port 1 View  Result Date: 09/11/2021 CLINICAL DATA:  Wheezing and dyspnea, COPD EXAM: PORTABLE CHEST 1 VIEW COMPARISON:  09/09/2021 chest radiograph. FINDINGS: Stable cardiomediastinal silhouette with normal heart size. No pneumothorax. Small bilateral pleural effusions, stable. New faint hazy opacities in the upper lungs bilaterally, right greater than left. Streaky and reticular bibasilar lung opacities, stable. IMPRESSION: 1. New faint hazy opacities in the upper lungs bilaterally, right greater than left, which could represent pneumonia or mild pulmonary edema. 2. Stable small bilateral pleural effusions. 3. Stable streaky and reticular bibasilar lung opacities, favor nonspecific scarring and/or atelectasis. Electronically Signed   By: Ilona Sorrel M.D.   On: 09/11/2021 08:01        Scheduled Meds:  aspirin  81 mg Oral Daily   isosorbide mononitrate  30 mg Oral Daily   metoprolol tartrate  12.5 mg Oral BID   pantoprazole  40 mg Oral Daily   ranolazine  500 mg Oral BID   rosuvastatin  10 mg Oral Daily   ticagrelor  90 mg Oral BID   umeclidinium-vilanterol  1 puff Inhalation Q0600   Continuous Infusions:  heparin 600 Units/hr  (09/10/21 0533)     LOS: 1 day    Time spent: over 30 min    Fayrene Helper, MD [facility]   To contact the attending provider between 7A-7P or the covering provider during after hours 7P-7A, please log into the web site [email or web address] and access using universal [place] password for that web site. If you do not have the password, please call the hospital operator.  09/11/2021, 8:31 AM

## 2021-09-11 NOTE — Consult Note (Signed)
ANTICOAGULATION CONSULT NOTE  Pharmacy Consult for Heparin gtt Indication: chest pain/ACS  Patient Measurements: Height: 5' (152.4 cm) Weight: 46.3 kg (102 lb) IBW/kg (Calculated) : 45.5 Heparin Dosing Weight: 46.3kg  Labs: Recent Labs    09/09/21 1402 09/09/21 1749 09/09/21 1956 09/10/21 0647 09/10/21 1602 09/11/21 0532  HGB 12.1  --   --  12.1  --  13.3  HCT 36.7  --   --  35.8*  --  38.7  PLT 246  --   --  234  --  272  APTT  --   --  28  --   --   --   LABPROT  --   --  14.0  --   --   --   INR  --   --  1.1  --   --   --   HEPARINUNFRC  --   --   --  0.49 0.45 0.48  CREATININE 1.14*  --   --  1.21*  --  1.33*  TROPONINIHS 273* 341* 284*  --   --   --      Estimated Creatinine Clearance: 20.6 mL/min (A) (by C-G formula based on SCr of 1.33 mg/dL (H)).   Medications:  No AC upon chart review.  Assessment: 87yo Female w/ h/o COPD (on 2L chronic), HTN, mixed sys/diaCHF, HLD, CAD, CKD3 osteoporosis, presented with SOB x4 days & b/l LE edema ISO recent history of MI 3wks prior (on 08/18/2021) but opted not to undergo LHC at that time. Pharmacy consulted for mgmt of heparin gtt.  Baseline Labs: aPTT - 28 INR - 1.1 Hgb - 12.1 Plts - 246 Troponin: 273>341  Date Time HL Rate/Comment 1105 0647 0.49 Thera; 600 un/hr  1105 1602 0.45 Thera; 600 un/hr 1106 0532 0.48 Thera; 600 un/hr   Goal of Therapy:  Heparin level 0.3-0.7 units/ml Monitor platelets by anticoagulation protocol: Yes   Plan:  Heparin level is therapeutic x 3 Continue heparin infusion at 600 units/hr Re-check anti-Xa level with AM labs Continue to monitor H&H and platelets  Benita Gutter  09/11/2021,7:25 AM

## 2021-09-11 NOTE — Progress Notes (Signed)
SUBJECTIVE: Occasional chest pain   Vitals:   09/11/21 0700 09/11/21 0800 09/11/21 0850 09/11/21 1000  BP: 121/68   120/69  Pulse: (!) 120 (!) 114  (!) 110  Resp: 19 19  (!) 21  Temp:    97.6 F (36.4 C)  TempSrc:      SpO2: 93% 95% 95% 98%  Weight:      Height:        Intake/Output Summary (Last 24 hours) at 09/11/2021 1112 Last data filed at 09/10/2021 1300 Gross per 24 hour  Intake --  Output 800 ml  Net -800 ml    LABS: Basic Metabolic Panel: Recent Labs    09/09/21 1956 09/10/21 0647 09/11/21 0532  NA  --  141 140  K  --  2.9* 3.5  CL  --  101 102  CO2  --  31 27  GLUCOSE  --  122* 152*  BUN  --  25* 25*  CREATININE  --  1.21* 1.33*  CALCIUM  --  9.1 9.2  MG 2.2  --  2.2  PHOS 2.0*  --  2.3*   Liver Function Tests: Recent Labs    09/09/21 1402 09/11/21 0532  AST 34 36  ALT 22 22  ALKPHOS 145* 143*  BILITOT 1.5* 1.9*  PROT 7.2 7.7  ALBUMIN 3.5 3.6   No results for input(s): LIPASE, AMYLASE in the last 72 hours. CBC: Recent Labs    09/09/21 1402 09/10/21 0647 09/11/21 0532  WBC 7.9 7.7 9.4  NEUTROABS 5.9  --  6.1  HGB 12.1 12.1 13.3  HCT 36.7 35.8* 38.7  MCV 98.7 98.1 97.0  PLT 246 234 272   Cardiac Enzymes: No results for input(s): CKTOTAL, CKMB, CKMBINDEX, TROPONINI in the last 72 hours. BNP: Invalid input(s): POCBNP D-Dimer: No results for input(s): DDIMER in the last 72 hours. Hemoglobin A1C: No results for input(s): HGBA1C in the last 72 hours. Fasting Lipid Panel: No results for input(s): CHOL, HDL, LDLCALC, TRIG, CHOLHDL, LDLDIRECT in the last 72 hours. Thyroid Function Tests: No results for input(s): TSH, T4TOTAL, T3FREE, THYROIDAB in the last 72 hours.  Invalid input(s): FREET3 Anemia Panel: Recent Labs    09/09/21 [place] 1,977*     PHYSICAL EXAM General: Well developed, well nourished, in no acute distress HEENT:  Normocephalic and atramatic Neck:  No JVD.  Lungs: Clear bilaterally to auscultation and  percussion. Heart: HRRR . Normal S1 and S2 without gallops or murmurs.  Abdomen: Bowel sounds are positive, abdomen soft and non-tender  Msk:  Back normal, normal gait. Normal strength and tone for age. Extremities: No clubbing, cyanosis or edema.   Neuro: Alert and oriented X 3. Psych:  Good affect, responds appropriately  TELEMETRY: Sinus rhythm  ASSESSMENT AND PLAN: Non-STEMI with repeated admission for chest pain plan on doing cardiac catheterization tomorrow.  Principal Problem:   NSTEMI (non-ST elevated myocardial infarction) ([place]) Active Problems:   Actinic keratosis   Hyperlipidemia   Essential hypertension   Panlobular emphysema (HCC)   Hx of smoking   Primary osteoarthritis of both hands   Acid reflux   Senile purpura (HCC)   Nocturnal hypoxemia due to emphysema (HCC)   Acute systolic CHF (congestive heart failure) (HCC)   Elevated troponin    Samantha Dawson A, MD, [facility] 09/11/2021 11:12 AM

## 2021-09-11 NOTE — ED Notes (Signed)
Repositioned pt. Provided for comfort and safety; lights off to enhance rest.

## 2021-09-12 ENCOUNTER — Other Ambulatory Visit: Payer: Self-pay

## 2021-09-12 ENCOUNTER — Encounter
Admission: EM | Disposition: A | Discharge status: Home Health | Payer: Self-pay | Source: Home / Self Care | Attending: Family Medicine

## 2021-09-12 ENCOUNTER — Encounter: Payer: Self-pay | Admitting: Family Medicine

## 2021-09-12 DIAGNOSIS — I2510 Atherosclerotic heart disease of native coronary artery without angina pectoris: Secondary | ICD-10-CM | POA: Diagnosis not present

## 2021-09-12 DIAGNOSIS — I214 Non-ST elevation (NSTEMI) myocardial infarction: Secondary | ICD-10-CM | POA: Diagnosis not present

## 2021-09-12 HISTORY — PX: CORONARY STENT INTERVENTION: CATH118234

## 2021-09-12 HISTORY — PX: LEFT HEART CATH AND CORONARY ANGIOGRAPHY: CATH118249

## 2021-09-12 LAB — COMPREHENSIVE METABOLIC PANEL
ALT: 22 U/L (ref 0–44)
AST: 67 U/L — ABNORMAL HIGH (ref 15–41)
Albumin: 3.4 g/dL — ABNORMAL LOW (ref 3.5–5.0)
Alkaline Phosphatase: 129 U/L — ABNORMAL HIGH (ref 38–126)
Anion gap: 9 (ref 5–15)
BUN: 27 mg/dL — ABNORMAL HIGH (ref 8–23)
CO2: 32 mmol/L (ref 22–32)
Calcium: 9.1 mg/dL (ref 8.9–10.3)
Chloride: 99 mmol/L (ref 98–111)
Creatinine, Ser: 1.42 mg/dL — ABNORMAL HIGH (ref 0.44–1.00)
GFR, Estimated: 35 mL/min — ABNORMAL LOW (ref >60)
Glucose, Bld: 117 mg/dL — ABNORMAL HIGH (ref 70–99)
Potassium: 3 mmol/L — ABNORMAL LOW (ref 3.5–5.1)
Sodium: 140 mmol/L (ref 135–145)
Total Bilirubin: 1.7 mg/dL — ABNORMAL HIGH (ref 0.3–1.2)
Total Protein: 7.1 g/dL (ref 6.5–8.1)

## 2021-09-12 LAB — CBC WITH DIFFERENTIAL/PLATELET
Abs Immature Granulocytes: 0.03 10*3/uL (ref 0.00–0.07)
Basophils Absolute: 0.1 10*3/uL (ref 0.0–0.1)
Basophils Relative: 1 %
Eosinophils Absolute: 0.2 10*3/uL (ref 0.0–0.5)
Eosinophils Relative: 2 %
HCT: 35.9 % — ABNORMAL LOW (ref 36.0–46.0)
Hemoglobin: 12 g/dL (ref 12.0–15.0)
Immature Granulocytes: 0 %
Lymphocytes Relative: 23 %
Lymphs Abs: 1.9 10*3/uL (ref 0.7–4.0)
MCH: 32.7 pg (ref 26.0–34.0)
MCHC: 33.4 g/dL (ref 30.0–36.0)
MCV: 97.8 fL (ref 80.0–100.0)
Monocytes Absolute: 1 10*3/uL (ref 0.1–1.0)
Monocytes Relative: 12 %
Neutro Abs: 5.3 10*3/uL (ref 1.7–7.7)
Neutrophils Relative %: 62 %
Platelets: 243 10*3/uL (ref 150–400)
RBC: 3.67 MIL/uL — ABNORMAL LOW (ref 3.87–5.11)
RDW: 15.2 % (ref 11.5–15.5)
WBC: 8.4 10*3/uL (ref 4.0–10.5)
nRBC: 0 % (ref 0.0–0.2)

## 2021-09-12 LAB — PHOSPHORUS: Phosphorus: 3.4 mg/dL (ref 2.5–4.6)

## 2021-09-12 LAB — TROPONIN I (HIGH SENSITIVITY): Troponin I (High Sensitivity): 7376 ng/L (ref <18)

## 2021-09-12 LAB — POCT ACTIVATED CLOTTING TIME: Activated Clotting Time: 433 seconds

## 2021-09-12 LAB — HEPARIN LEVEL (UNFRACTIONATED): Heparin Unfractionated: 0.53 IU/mL (ref 0.30–0.70)

## 2021-09-12 LAB — GLUCOSE, CAPILLARY: Glucose-Capillary: 117 mg/dL — ABNORMAL HIGH (ref 70–99)

## 2021-09-12 LAB — MAGNESIUM: Magnesium: 2.2 mg/dL (ref 1.7–2.4)

## 2021-09-12 SURGERY — LEFT HEART CATH AND CORONARY ANGIOGRAPHY
Anesthesia: Moderate Sedation

## 2021-09-12 MED ORDER — HEPARIN (PORCINE) IN NACL 1000-0.9 UT/500ML-% IV SOLN
INTRAVENOUS | Status: DC | PRN
  Administered 2021-09-12: 1000 mL

## 2021-09-12 MED ORDER — FUROSEMIDE 10 MG/ML IJ SOLN
INTRAMUSCULAR | Status: AC
  Filled 2021-09-12: qty 4

## 2021-09-12 MED ORDER — SODIUM CHLORIDE 0.9 % WEIGHT BASED INFUSION: 1.0000 mL/kg/h | INTRAVENOUS | Status: DC

## 2021-09-12 MED ORDER — IOHEXOL 350 MG/ML SOLN
INTRAVENOUS | Status: DC | PRN
  Administered 2021-09-12: 50 mL

## 2021-09-12 MED ORDER — ASPIRIN 81 MG PO CHEW: 81.0000 mg | CHEWABLE_TABLET | ORAL | Status: DC

## 2021-09-12 MED ORDER — HYDRALAZINE HCL 20 MG/ML IJ SOLN: 10.0000 mg | INTRAMUSCULAR | Status: AC | PRN

## 2021-09-12 MED ORDER — FUROSEMIDE 10 MG/ML IJ SOLN
INTRAMUSCULAR | Status: DC | PRN
  Administered 2021-09-12: 20 mg via INTRAVENOUS

## 2021-09-12 MED ORDER — TICAGRELOR 90 MG PO TABS
ORAL_TABLET | ORAL | Status: DC | PRN
  Administered 2021-09-12: 180 mg via ORAL

## 2021-09-12 MED ORDER — MIDAZOLAM HCL 2 MG/2ML IJ SOLN
INTRAMUSCULAR | Status: DC | PRN
  Administered 2021-09-12: .5 mg via INTRAVENOUS

## 2021-09-12 MED ORDER — NITROGLYCERIN 1 MG/10 ML FOR IR/CATH LAB
INTRA_ARTERIAL | Status: DC | PRN
  Administered 2021-09-12: 100 ug via INTRACORONARY

## 2021-09-12 MED ORDER — ASPIRIN 81 MG PO CHEW
CHEWABLE_TABLET | ORAL | Status: AC
  Filled 2021-09-12: qty 1

## 2021-09-12 MED ORDER — VERAPAMIL HCL 2.5 MG/ML IV SOLN
INTRAVENOUS | Status: DC | PRN
  Administered 2021-09-12 (×2): 2.5 mg via INTRA_ARTERIAL

## 2021-09-12 MED ORDER — SODIUM CHLORIDE 0.9% FLUSH: 3.0000 mL | INTRAVENOUS | Status: DC | PRN

## 2021-09-12 MED ORDER — ONDANSETRON HCL 4 MG/2ML IJ SOLN: 4.0000 mg | Freq: Four times a day (QID) | INTRAMUSCULAR | Status: DC | PRN

## 2021-09-12 MED ORDER — ACETAMINOPHEN 325 MG PO TABS
650.0000 mg | ORAL_TABLET | ORAL | Status: DC | PRN
  Administered 2021-09-12: 650 mg via ORAL

## 2021-09-12 MED ORDER — SODIUM CHLORIDE 0.9 % IV SOLN: 250.0000 mL | INTRAVENOUS | Status: DC | PRN

## 2021-09-12 MED ORDER — LIDOCAINE HCL (PF) 1 % IJ SOLN
INTRAMUSCULAR | Status: DC | PRN
  Administered 2021-09-12: 2 mL

## 2021-09-12 MED ORDER — SODIUM CHLORIDE 0.9 % WEIGHT BASED INFUSION
3.0000 mL/kg/h | INTRAVENOUS | Status: DC
  Administered 2021-09-12: 3 mL/kg/h via INTRAVENOUS

## 2021-09-12 MED ORDER — LIDOCAINE HCL (PF) 1 % IJ SOLN
INTRAMUSCULAR | Status: DC | PRN
  Administered 2021-09-12: 5 mL

## 2021-09-12 MED ORDER — HEPARIN (PORCINE) IN NACL 1000-0.9 UT/500ML-% IV SOLN
INTRAVENOUS | Status: AC
  Filled 2021-09-12: qty 1000

## 2021-09-12 MED ORDER — LABETALOL HCL 5 MG/ML IV SOLN: 10.0000 mg | INTRAVENOUS | Status: AC | PRN

## 2021-09-12 MED ORDER — SODIUM CHLORIDE 0.9% FLUSH
3.0000 mL | Freq: Two times a day (BID) | INTRAVENOUS | Status: DC
  Administered 2021-09-12 – 2021-09-17 (×11): 3 mL via INTRAVENOUS

## 2021-09-12 MED ORDER — LIDOCAINE HCL 1 % IJ SOLN
INTRAMUSCULAR | Status: AC
  Filled 2021-09-12: qty 20

## 2021-09-12 MED ORDER — SODIUM CHLORIDE 0.9 % WEIGHT BASED INFUSION
1.0000 mL/kg/h | INTRAVENOUS | Status: AC
  Administered 2021-09-12: 1 mL/kg/h via INTRAVENOUS

## 2021-09-12 MED ORDER — VERAPAMIL HCL 2.5 MG/ML IV SOLN
INTRAVENOUS | Status: AC
  Filled 2021-09-12: qty 2

## 2021-09-12 MED ORDER — MIDAZOLAM HCL 2 MG/2ML IJ SOLN
INTRAMUSCULAR | Status: AC
  Filled 2021-09-12: qty 2

## 2021-09-12 MED ORDER — HEPARIN SODIUM (PORCINE) 1000 UNIT/ML IJ SOLN
INTRAMUSCULAR | Status: AC
  Filled 2021-09-12: qty 1

## 2021-09-12 MED ORDER — SODIUM CHLORIDE 0.9% FLUSH
3.0000 mL | Freq: Two times a day (BID) | INTRAVENOUS | Status: DC
  Administered 2021-09-12 – 2021-09-16 (×7): 3 mL via INTRAVENOUS

## 2021-09-12 MED ORDER — TICAGRELOR 90 MG PO TABS
ORAL_TABLET | ORAL | Status: AC
  Filled 2021-09-12: qty 2

## 2021-09-12 MED ORDER — HEPARIN SODIUM (PORCINE) 1000 UNIT/ML IJ SOLN
INTRAMUSCULAR | Status: DC | PRN
  Administered 2021-09-12: 2000 [IU] via INTRAVENOUS
  Administered 2021-09-12: 2500 [IU] via INTRAVENOUS

## 2021-09-12 MED ORDER — ACETAMINOPHEN 325 MG PO TABS
ORAL_TABLET | ORAL | Status: AC
  Filled 2021-09-12: qty 2

## 2021-09-12 MED ORDER — POTASSIUM CHLORIDE CRYS ER 20 MEQ PO TBCR
40.0000 meq | EXTENDED_RELEASE_TABLET | ORAL | Status: AC
  Administered 2021-09-12 – 2021-09-13 (×2): 40 meq via ORAL
  Filled 2021-09-12: qty 2
  Filled 2021-09-12: qty 4

## 2021-09-12 SURGICAL SUPPLY — 25 items
BALLN TREK RX 2.5X12 (BALLOONS) ×2
BALLN ~~LOC~~ TREK RX 2.5X12 (BALLOONS) ×2
BALLOON TREK RX 2.5X12 (BALLOONS) ×1 IMPLANT
BALLOON ~~LOC~~ TREK RX 2.5X12 (BALLOONS) ×1 IMPLANT
CATH INFINITI 5FR JL4 (CATHETERS) IMPLANT
CATH INFINITI JR4 5F (CATHETERS) ×2 IMPLANT
CATH LAUNCHER 5F EBU3.5 (CATHETERS) ×2 IMPLANT
DEVICE RAD TR BAND REGULAR (VASCULAR PRODUCTS) ×2 IMPLANT
DRAPE BRACHIAL (DRAPES) ×2 IMPLANT
GLIDESHEATH SLEND SS 6F .021 (SHEATH) IMPLANT
GUIDEWIRE INQWIRE 1.5J.035X260 (WIRE) ×1 IMPLANT
INQWIRE 1.5J .035X260CM (WIRE) ×2
KIT ENCORE 26 ADVANTAGE (KITS) ×2 IMPLANT
NEEDLE PERC 18GX7CM (NEEDLE) ×2 IMPLANT
PACK CARDIAC CATH (CUSTOM PROCEDURE TRAY) ×2 IMPLANT
PROTECTION STATION PRESSURIZED (MISCELLANEOUS) ×2
SET ATX SIMPLICITY (MISCELLANEOUS) ×2 IMPLANT
SHEATH AVANTI 5FR X 11CM (SHEATH) ×2 IMPLANT
SHEATH GLIDE SLENDER 4/5FR (SHEATH) ×2 IMPLANT
STATION PROTECTION PRESSURIZED (MISCELLANEOUS) ×1 IMPLANT
STENT ONYX FRONTIER 2.5X15 (Permanent Stent) ×2 IMPLANT
TUBING CIL FLEX 10 FLL-RA (TUBING) ×2 IMPLANT
WIRE GUIDERIGHT .035X150 (WIRE) ×4 IMPLANT
WIRE HITORQ VERSACORE ST 145CM (WIRE) ×4 IMPLANT
WIRE RUNTHROUGH .014X180CM (WIRE) ×2 IMPLANT

## 2021-09-12 NOTE — Progress Notes (Signed)
PT Cancellation Note  Patient Details Name: GAVRIELA CASHIN MRN: [id number] DOB: February 20, 1929   Cancelled Treatment:    Reason Eval/Treat Not Completed: Patient at procedure or test/unavailable. Pt off floor for cardiac catheterization. Will hold and re-attempt at later time/date as medically appropriate and available.   Salem Caster. Fairly IV, PT, DPT Physical Therapist- [facility]  09/12/2021, 1:27 PM

## 2021-09-12 NOTE — Consult Note (Signed)
[place] for Heparin gtt Indication: chest pain/ACS  Patient Measurements: Height: 5' (152.4 cm) Weight: 45.6 kg (100 lb 9.6 oz) IBW/kg (Calculated) : 45.5 Heparin Dosing Weight: 46.3kg  Labs: Recent Labs    09/09/21 1402 09/09/21 1749 09/09/21 1956 09/10/21 0647 09/10/21 1602 09/11/21 0532 09/12/21 0457  HGB  --   --   --  12.1  --  13.3 12.0  HCT  --   --   --  35.8*  --  38.7 35.9*  PLT  --   --   --  234  --  272 243  APTT  --   --  28  --   --   --   --   LABPROT  --   --  14.0  --   --   --   --   INR  --   --  1.1  --   --   --   --   HEPARINUNFRC   < >  --   --  0.49 0.45 0.48 0.53  CREATININE  --   --   --  1.21*  --  1.33* 1.42*  TROPONINIHS  --  341* 284*  --   --   --  7,376*   < > = values in this interval not displayed.     Estimated Creatinine Clearance: 19.3 mL/min (A) (by C-G formula based on SCr of 1.42 mg/dL (H)).   Medications:  No AC upon chart review.  Assessment: 87yo Female w/ h/o COPD (on 2L chronic), HTN, mixed sys/diaCHF, HLD, CAD, CKD3 osteoporosis, presented with SOB x4 days & b/l LE edema ISO recent history of MI 3wks prior (on 08/18/2021) but opted not to undergo LHC at that time. Pharmacy consulted for mgmt of heparin gtt.  Baseline Labs: aPTT - 28 INR - 1.1 Hgb - 12.1 Plts - 246 Troponin: 273>341  Date Time HL Rate/Comment 1105 0647 0.49 Thera; 600 un/hr  1105 1602 0.45 Thera; 600 un/hr 1106 0532 0.48 Thera; 600 un/hr 1107 0457 0.53 Thera; 600 un/hr   Goal of Therapy:  Heparin level 0.3-0.7 units/ml Monitor platelets by anticoagulation protocol: Yes   Plan:  Heparin level is therapeutic x 4 Continue heparin infusion at 600 units/hr Re-check anti-Xa level with AM labs Continue to monitor H&H and platelets  Renda Rolls, PharmD, [facility] 09/12/2021 7:05 AM

## 2021-09-12 NOTE — Progress Notes (Signed)
PROGRESS NOTE    [name]  DSK:[id number] DOB: 08-09-1929 DOA: 09/09/2021 PCP: Steele Sizer, MD    Chief Complaint  Patient presents with   Shortness of Breath    Brief Narrative:  Ms. Samantha Dawson is an 87 year old pleasant, AAOx4, female with CAD, combined heart failure, history of hypertension, history of tobacco use, emphysema, who is being admitted for presumed combined heart failure exacerbation, complicated by NSTEMI.  Assessment & Plan:   Principal Problem:   NSTEMI (non-ST elevated myocardial infarction) ([place]) Active Problems:   Actinic keratosis   Hyperlipidemia   Essential hypertension   Panlobular emphysema (HCC)   Hx of smoking   Primary osteoarthritis of both hands   Acid reflux   Senile purpura (HCC)   Nocturnal hypoxemia due to emphysema (HCC)   Acute systolic CHF (congestive heart failure) (HCC)   Elevated troponin  # NSTEMI  CAD -- troponin peaked at 7376 this AM -- LHC with severe 2 vessel CAD with chronic occlusion of proxima/mid L circumflex with left to left collaterals and severe 99% stenosis in proximal LAD which is likely culprit for NSTEMI -> s/p angioplastic and DES to proximal LAD (25% residual stenosis) -- see report -- continue aspirin, brilinta - needs DAPT at least 12 months -- continue imdur, metoprolol, ranexa, crestor, nitro sl -- brilinta on hold --> resume per discussion with Dr. Humphrey Rolls -- case attempted from R femoral access initially, but unable to advance wire and femoral access aborted -> case performed by Dr. Fletcher Anon with R radial artery access  # Acute Hypoxic Respiratory Failure  HFrEF Exacerbation  Shortness of breath - wean O2 as tolerated -- CXR with enlargement of cardiac silhouette with pulm vascular congestion and probably pulm edema/CHF - small bibasilar effusions/atelectasis  -- repeat CXR with faint hazy opacities in the upper lungs bilaterally R>L - pneumonia or mild pulm edema, small bilateral effusions, streaky and  raticular bibasilar opacities (no fever, no white count, continue to treat as HF) - s/p lasix today with cath - Strict I's and O's, daily weights  # Acute kidney injury --baseline creatinine <1, creatinine up to 1.42 today --in setting of HF and diuresis, will follow closely with diuresis and contrast -UA bland --renal US - negative for hydro  # Hyperlipidemia-rosuvastatin 20 mg daily, may need to adjust dose with AKI   # Emphysema-maintenance inhalers have been resumed   # Debility-difficulty ambulating - Daughter, [name], healthcare power of attorney is requesting Daytona Hedman wheelchair - Physical therapy has been consulted - Transition of care managers have been consulted for this wheelchair  DVT prophylaxis: heparin gtt Code Status: DNR Family Communication: daughters at bedside, discussed Disposition:   Status is: Observation  The patient will require care spanning > 2 midnights and should be moved to inpatient because: cardiology consult, planning cath       Consultants:  cardiology  Procedures:  none  Antimicrobials:  Anti-infectives (From admission, onward)    None          Subjective: Feeling [name] little better after cath  Objective: Vitals:   09/12/21 1400 09/12/21 1415 09/12/21 1445 09/12/21 1524  BP: (!) 78/64 (!) 84/59 (!) 100/57 124/64  Pulse: 87 95 (!) 101 96  Resp: (!) 21 (!) 26 19   Temp:    97.7 F (36.5 C)  TempSrc:    Oral  SpO2: 97% 93% 97% 94%  Weight:      Height:        Intake/Output Summary (Last 24 hours)  at 09/12/2021 1836 Last data filed at 09/12/2021 1700 Gross per 24 hour  Intake 312 ml  Output 1050 ml  Net -738 ml   Filed Weights   09/09/21 1400 09/11/21 1342  Weight: 46.3 kg 45.6 kg    Examination:  General: No acute distress. Cardiovascular: Heart sounds show Mariellen Blaney regular rate, and rhythm.  Lungs: Clear to auscultation bilaterally  Abdomen: Soft, nontender, nondistended  Neurological: Alert and oriented 3. Moves all  extremities 4 . Cranial nerves II through XII grossly intact. Skin: Warm and dry. No rashes or lesions. Extremities: No clubbing or cyanosis. No edema.   Data Reviewed: I have personally reviewed following labs and imaging studies  CBC: Recent Labs  Lab 09/09/21 1402 09/10/21 0647 09/11/21 0532 09/12/21 0457  WBC 7.9 7.7 9.4 8.4  NEUTROABS 5.9  --  6.1 5.3  HGB 12.1 12.1 13.3 12.0  HCT 36.7 35.8* 38.7 35.9*  MCV 98.7 98.1 97.0 97.8  PLT 246 234 272 850    Basic Metabolic Panel: Recent Labs  Lab 09/09/21 1402 09/09/21 1956 09/10/21 0647 09/11/21 0532 09/12/21 0457  NA 142  --  141 140 140  K 3.8  --  2.9* 3.5 3.0*  CL 105  --  101 102 99  CO2 27  --  31 27 32  GLUCOSE 175*  --  122* 152* 117*  BUN 27*  --  25* 25* 27*  CREATININE 1.14*  --  1.21* 1.33* 1.42*  CALCIUM 9.8  --  9.1 9.2 9.1  MG  --  2.2  --  2.2 2.2  PHOS  --  2.0*  --  2.3* 3.4    GFR: Estimated Creatinine Clearance: 19.3 mL/min (Kaari Zeigler) (by C-G formula based on SCr of 1.42 mg/dL (H)).  Liver Function Tests: Recent Labs  Lab 09/09/21 1402 09/11/21 0532 09/12/21 0457  AST 34 36 67*  ALT 22 22 22   ALKPHOS 145* 143* 129*  BILITOT 1.5* 1.9* 1.7*  PROT 7.2 7.7 7.1  ALBUMIN 3.5 3.6 3.4*    CBG: No results for input(s): GLUCAP in the last 168 hours.   Recent Results (from the past 240 hour(s))  Resp Panel by RT-PCR (Flu Lorisa Scheid&B, Covid) Nasopharyngeal Swab     Status: None   Collection Time: 09/09/21  6:51 PM   Specimen: Nasopharyngeal Swab; Nasopharyngeal(NP) swabs in vial transport medium  Result Value Ref Range Status   SARS Coronavirus 2 by RT PCR NEGATIVE NEGATIVE Final    Comment: (NOTE) SARS-CoV-2 target nucleic acids are NOT DETECTED.  The SARS-CoV-2 RNA is generally detectable in upper respiratory specimens during the acute phase of infection. The lowest concentration of SARS-CoV-2 viral copies this assay can detect is 138 copies/mL. Marvina Danner negative result does not preclude  SARS-Cov-2 infection and should not be used as the sole basis for treatment or other patient management decisions. Liesl Simons negative result may occur with  improper specimen collection/handling, submission of specimen other than nasopharyngeal swab, presence of viral mutation(s) within the areas targeted by this assay, and inadequate number of viral copies(<138 copies/mL). Issac Moure negative result must be combined with clinical observations, patient history, and epidemiological information. The expected result is Negative.  Fact Sheet for Patients:  EntrepreneurPulse.com.au  Fact Sheet for Healthcare Providers:  IncredibleEmployment.be  This test is no t yet approved or cleared by the [place] FDA and  has been authorized for detection and/or diagnosis of SARS-CoV-2 by FDA under an Emergency Use Authorization (EUA). This EUA will remain  in effect (  meaning this test can be used) for the duration of the COVID-19 declaration under Section 564(b)(1) of the Act, 21 U.S.C.section 360bbb-3(b)(1), unless the authorization is terminated  or revoked sooner.       Influenza Jerelle Virden by PCR NEGATIVE NEGATIVE Final   Influenza B by PCR NEGATIVE NEGATIVE Final    Comment: (NOTE) The Xpert Xpress SARS-CoV-2/FLU/RSV plus assay is intended as an aid in the diagnosis of influenza from Nasopharyngeal swab specimens and should not be used as Isa Kohlenberg sole basis for treatment. Nasal washings and aspirates are unacceptable for Xpert Xpress SARS-CoV-2/FLU/RSV testing.  Fact Sheet for Patients: EntrepreneurPulse.com.au  Fact Sheet for Healthcare Providers: IncredibleEmployment.be  This test is not yet approved or cleared by the [place] FDA and has been authorized for detection and/or diagnosis of SARS-CoV-2 by FDA under an Emergency Use Authorization (EUA). This EUA will remain in effect (meaning this test can be used) for the duration of  the COVID-19 declaration under Section 564(b)(1) of the Act, 21 U.S.C. section 360bbb-3(b)(1), unless the authorization is terminated or revoked.  Performed at [facility], [address] [place], [place] [address]          Radiology Studies: CARDIAC CATHETERIZATION  Result Date: 09/12/2021   Colon Flattery LM lesion is 30% stenosed.   Prox Cx to Mid Cx lesion is 100% stenosed.   Prox LAD to Mid LAD lesion is 99% stenosed.   Mid LAD to Dist LAD lesion is 30% stenosed.   Tristram Milian drug-eluting stent was successfully placed using Nash Bolls STENT ONYX FRONTIER 2.5X15.   Post intervention, there is Jazlyne Gauger 25% residual stenosis. 1.  Severe two-vessel coronary artery disease with chronic occlusion of proximal/mid left circumflex with left to left collaterals and severe 99% stenosis in the proximal LAD which is the likely culprit for non-ST elevation myocardial infarction. 2.  Left ventricular angiography was not performed due to underlying chronic kidney disease. 3.  Severely elevated left ventricular end-diastolic pressure at 31 mmHg. 4.  Successful angioplasty and drug-eluting stent placement to the proximal LAD.  Suboptimal results due to inability to fully expand the stent proximally due to heavy calcifications.  The procedure was limited by Javanna Patin 5 [place] guide system given lack of alternative vascular access.  Mani Celestin small first diagonal was jailed by the stent with TIMI I flow but the vessel was too small to rescue. Recommendations: Dual antiplatelet therapy for at least 12 months. Will give 1 dose of IV furosemide given shortness of breath and significantly elevated left ventricular end-diastolic pressure. Aggressive treatment of risk factors. Please note that the case was attempted initially from the right femoral access by Dr. Humphrey Rolls but was not able to advance the wire more than few centimeters and thus femoral access was aborted and I elected for right radial artery access in spite of small right radial artery size by  ultrasound.   US RENAL  Result Date: 09/11/2021 CLINICAL DATA:  Acute renal insufficiency. EXAM: RENAL / URINARY TRACT ULTRASOUND COMPLETE COMPARISON:  None. FINDINGS: Right Kidney: Renal measurements: 9.3 x 4.2 x 4.6 cm = volume: 95 mL. Echogenicity within normal limits. No hydronephrosis visualized. There is Katyana Trolinger simple cyst exophytic off of the lower right renal cortex measuring 1.8 cm in greatest dimension. Left Kidney: Renal measurements: 7.9 x 3.3 x 3.6 = volume: 48 mL. Echogenicity within normal limits. No hydronephrosis visualized. Two benign cysts seen. There is Sritha Chauncey 2.7 cm cyst in the midpole region of the left kidney and Elba Dendinger 1.7 cm cyst, exophytic  off of the lower pole of the left kidney. Bladder: Distended with slightly lobulated contour. Other: Bilateral pleural effusions. Heterogeneous echogenicity of the liver, partially visualized. IMPRESSION: Increased renal echogenicity, consistent with medical renal disease. Bilateral renal cysts. Heterogeneous echogenicity of the liver, partially visualized. Evaluation with right upper quadrant ultrasound or cross-sectional imaging may be considered. Bilateral pleural effusions. Electronically Signed   By: Fidela Salisbury M.D.   On: 09/11/2021 10:29   DG Chest Port 1 View  Result Date: 09/11/2021 CLINICAL DATA:  Wheezing and dyspnea, COPD EXAM: PORTABLE CHEST 1 VIEW COMPARISON:  09/09/2021 chest radiograph. FINDINGS: Stable cardiomediastinal silhouette with normal heart size. No pneumothorax. Small bilateral pleural effusions, stable. New faint hazy opacities in the upper lungs bilaterally, right greater than left. Streaky and reticular bibasilar lung opacities, stable. IMPRESSION: 1. New faint hazy opacities in the upper lungs bilaterally, right greater than left, which could represent pneumonia or mild pulmonary edema. 2. Stable small bilateral pleural effusions. 3. Stable streaky and reticular bibasilar lung opacities, favor nonspecific scarring and/or  atelectasis. Electronically Signed   By: Ilona Sorrel M.D.   On: 09/11/2021 08:01        Scheduled Meds:  acetaminophen       aspirin       aspirin  81 mg Oral Daily   isosorbide mononitrate  30 mg Oral Daily   metoprolol tartrate  12.5 mg Oral BID   pantoprazole  40 mg Oral Daily   rosuvastatin  10 mg Oral Daily   sodium chloride flush  3 mL Intravenous Q12H   sodium chloride flush  3 mL Intravenous Q12H   sodium chloride flush  3 mL Intravenous Q12H   ticagrelor  90 mg Oral BID   umeclidinium-vilanterol  1 puff Inhalation Q0600   Continuous Infusions:  sodium chloride     sodium chloride     sodium chloride 1 mL/kg/hr (09/12/21 1756)     LOS: 2 days    Time spent: over 30 min    Fayrene Helper, MD [facility]   To contact the attending provider between 7A-7P or the covering provider during after hours 7P-7A, please log into the web site [email or web address] and access using universal [place] password for that web site. If you do not have the password, please call the hospital operator.  09/12/2021, 6:36 PM

## 2021-09-12 NOTE — Progress Notes (Signed)
PT Cancellation Note  Patient Details Name: Samantha Dawson MRN: [id number] DOB: 08/14/1929   Cancelled Treatment:    Reason Eval/Treat Not Completed: Fatigue/lethargy limiting ability to participate. Per RN pt is off bed rest but just returned to floor. States pt is very tired and tomorrow would be a better day for pt to participate. PT will re-attempt at later time/date as medically appropriate and as able.   Salem Caster. Fairly IV, PT, DPT Physical Therapist- [facility]  09/12/2021, 3:52 PM

## 2021-09-12 NOTE — Plan of Care (Signed)
  Problem: Education: Goal: Knowledge of General Education information will improve Description: Including pain rating scale, medication(s)/side effects and non-pharmacologic comfort measures Outcome: Progressing   Problem: Clinical Measurements: Goal: Will remain free from infection Outcome: Progressing   

## 2021-09-12 NOTE — Progress Notes (Signed)
SUBJECTIVE:no chest pain   Vitals:   09/12/21 0435 09/12/21 0500 09/12/21 0600 09/12/21 0728  BP: (!) 101/52   128/62  Pulse: 82   96  Resp: 19 20 20  (!) 21  Temp: 98.6 F (37 C)   98.1 F (36.7 C)  TempSrc: Axillary   Oral  SpO2: 97%   94%  Weight:      Height:        Intake/Output Summary (Last 24 hours) at 09/12/2021 0749 Last data filed at 09/12/2021 0700 Gross per 24 hour  Intake 552 ml  Output 600 ml  Net -48 ml    LABS: Basic Metabolic Panel: Recent Labs    09/11/21 0532 09/12/21 0457  NA 140 140  K 3.5 3.0*  CL 102 99  CO2 27 32  GLUCOSE 152* 117*  BUN 25* 27*  CREATININE 1.33* 1.42*  CALCIUM 9.2 9.1  MG 2.2 2.2  PHOS 2.3* 3.4   Liver Function Tests: Recent Labs    09/11/21 0532 09/12/21 0457  AST 36 67*  ALT 22 22  ALKPHOS 143* 129*  BILITOT 1.9* 1.7*  PROT 7.7 7.1  ALBUMIN 3.6 3.4*   No results for input(s): LIPASE, AMYLASE in the last 72 hours. CBC: Recent Labs    09/11/21 0532 09/12/21 0457  WBC 9.4 8.4  NEUTROABS 6.1 5.3  HGB 13.3 12.0  HCT 38.7 35.9*  MCV 97.0 97.8  PLT 272 243   Cardiac Enzymes: No results for input(s): CKTOTAL, CKMB, CKMBINDEX, TROPONINI in the last 72 hours. BNP: Invalid input(s): POCBNP D-Dimer: No results for input(s): DDIMER in the last 72 hours. Hemoglobin A1C: No results for input(s): HGBA1C in the last 72 hours. Fasting Lipid Panel: No results for input(s): CHOL, HDL, LDLCALC, TRIG, CHOLHDL, LDLDIRECT in the last 72 hours. Thyroid Function Tests: No results for input(s): TSH, T4TOTAL, T3FREE, THYROIDAB in the last 72 hours.  Invalid input(s): FREET3 Anemia Panel: Recent Labs    09/09/21 [place] 1,977*     PHYSICAL EXAM General: Well developed, well nourished, in no acute distress HEENT:  Normocephalic and atramatic Neck:  No JVD.  Lungs: Clear bilaterally to auscultation and percussion. Heart: HRRR . Normal S1 and S2 without gallops or murmurs.  Abdomen: Bowel sounds are  positive, abdomen soft and non-tender  Msk:  Back normal, normal gait. Normal strength and tone for age. Extremities: No clubbing, cyanosis or edema.   Neuro: Alert and oriented X 3. Psych:  Good affect, responds appropriately  TELEMETRY:NSR  ASSESSMENT AND PLAN:NSTEMI, advise cath schedualed this AM.  Principal Problem:   NSTEMI (non-ST elevated myocardial infarction) ([place]) Active Problems:   Actinic keratosis   Hyperlipidemia   Essential hypertension   Panlobular emphysema (HCC)   Hx of smoking   Primary osteoarthritis of both hands   Acid reflux   Senile purpura (HCC)   Nocturnal hypoxemia due to emphysema (HCC)   Acute systolic CHF (congestive heart failure) (HCC)   Elevated troponin    Neoma Laming A, MD, [facility] 09/12/2021 7:49 AM

## 2021-09-13 ENCOUNTER — Inpatient Hospital Stay: Payer: Medicare Other

## 2021-09-13 ENCOUNTER — Encounter: Payer: Self-pay | Admitting: Cardiovascular Disease

## 2021-09-13 DIAGNOSIS — I214 Non-ST elevation (NSTEMI) myocardial infarction: Secondary | ICD-10-CM | POA: Diagnosis not present

## 2021-09-13 LAB — CBC WITH DIFFERENTIAL/PLATELET
Abs Immature Granulocytes: 0.03 10*3/uL (ref 0.00–0.07)
Basophils Absolute: 0 10*3/uL (ref 0.0–0.1)
Basophils Relative: 0 %
Eosinophils Absolute: 0.2 10*3/uL (ref 0.0–0.5)
Eosinophils Relative: 2 %
HCT: 36.6 % (ref 36.0–46.0)
Hemoglobin: 12 g/dL (ref 12.0–15.0)
Immature Granulocytes: 0 %
Lymphocytes Relative: 15 %
Lymphs Abs: 1.3 10*3/uL (ref 0.7–4.0)
MCH: 32.6 pg (ref 26.0–34.0)
MCHC: 32.8 g/dL (ref 30.0–36.0)
MCV: 99.5 fL (ref 80.0–100.0)
Monocytes Absolute: 1.1 10*3/uL — ABNORMAL HIGH (ref 0.1–1.0)
Monocytes Relative: 12 %
Neutro Abs: 6.4 10*3/uL (ref 1.7–7.7)
Neutrophils Relative %: 71 %
Platelets: 205 10*3/uL (ref 150–400)
RBC: 3.68 MIL/uL — ABNORMAL LOW (ref 3.87–5.11)
RDW: 15.5 % (ref 11.5–15.5)
WBC: 9 10*3/uL (ref 4.0–10.5)
nRBC: 0 % (ref 0.0–0.2)

## 2021-09-13 LAB — COMPREHENSIVE METABOLIC PANEL
ALT: 21 U/L (ref 0–44)
AST: 66 U/L — ABNORMAL HIGH (ref 15–41)
Albumin: 3.3 g/dL — ABNORMAL LOW (ref 3.5–5.0)
Alkaline Phosphatase: 123 U/L (ref 38–126)
Anion gap: 11 (ref 5–15)
BUN: 24 mg/dL — ABNORMAL HIGH (ref 8–23)
CO2: 24 mmol/L (ref 22–32)
Calcium: 8.9 mg/dL (ref 8.9–10.3)
Chloride: 106 mmol/L (ref 98–111)
Creatinine, Ser: 1.32 mg/dL — ABNORMAL HIGH (ref 0.44–1.00)
GFR, Estimated: 39 mL/min — ABNORMAL LOW (ref >60)
Glucose, Bld: 121 mg/dL — ABNORMAL HIGH (ref 70–99)
Potassium: 4.5 mmol/L (ref 3.5–5.1)
Sodium: 141 mmol/L (ref 135–145)
Total Bilirubin: 1.9 mg/dL — ABNORMAL HIGH (ref 0.3–1.2)
Total Protein: 7 g/dL (ref 6.5–8.1)

## 2021-09-13 LAB — PHOSPHORUS: Phosphorus: 2.6 mg/dL (ref 2.5–4.6)

## 2021-09-13 LAB — MAGNESIUM: Magnesium: 2.3 mg/dL (ref 1.7–2.4)

## 2021-09-13 LAB — BRAIN NATRIURETIC PEPTIDE: B Natriuretic Peptide: 1844 pg/mL — ABNORMAL HIGH (ref 0.0–100.0)

## 2021-09-13 MED ORDER — FUROSEMIDE 10 MG/ML IJ SOLN: 20.0000 mg | Freq: Once | INTRAMUSCULAR | Status: DC

## 2021-09-13 MED ORDER — HEPARIN SODIUM (PORCINE) 5000 UNIT/ML IJ SOLN
5000.0000 [IU] | Freq: Three times a day (TID) | INTRAMUSCULAR | Status: DC
  Administered 2021-09-13 – 2021-09-17 (×11): 5000 [IU] via SUBCUTANEOUS
  Filled 2021-09-13 (×11): qty 1

## 2021-09-13 MED ORDER — FUROSEMIDE 10 MG/ML IJ SOLN
20.0000 mg | Freq: Once | INTRAMUSCULAR | Status: AC
  Administered 2021-09-13: 20 mg via INTRAVENOUS
  Filled 2021-09-13: qty 2

## 2021-09-13 NOTE — Care Management Important Message (Signed)
Important Message  Patient Details  Name: Samantha Dawson MRN: [id number] Date of Birth: 04-10-1929   Medicare Important Message Given:  Yes     Dannette Barbara 09/13/2021, 4:12 PM

## 2021-09-13 NOTE — Consult Note (Signed)
SUBJECTIVE: Feeling some shortness of breath   Vitals:   09/13/21 0521 09/13/21 0525 09/13/21 0756 09/13/21 0910  BP:   (!) 85/67 (!) 95/58  Pulse:   96   Resp:   18   Temp:   98.2 F (36.8 C)   TempSrc:      SpO2: 96% 96% 98%   Weight:      Height:        Intake/Output Summary (Last 24 hours) at 09/13/2021 0944 Last data filed at 09/12/2021 1700 Gross per 24 hour  Intake --  Output 450 ml  Net -450 ml    LABS: Basic Metabolic Panel: Recent Labs    09/12/21 0457 09/13/21 0555  NA 140 141  K 3.0* 4.5  CL 99 106  CO2 32 24  GLUCOSE 117* 121*  BUN 27* 24*  CREATININE 1.42* 1.32*  CALCIUM 9.1 8.9  MG 2.2 2.3  PHOS 3.4 2.6   Liver Function Tests: Recent Labs    09/12/21 0457 09/13/21 0555  AST 67* 66*  ALT 22 21  ALKPHOS 129* 123  BILITOT 1.7* 1.9*  PROT 7.1 7.0  ALBUMIN 3.4* 3.3*   No results for input(s): LIPASE, AMYLASE in the last 72 hours. CBC: Recent Labs    09/12/21 0457 09/13/21 0555  WBC 8.4 9.0  NEUTROABS 5.3 6.4  HGB 12.0 12.0  HCT 35.9* 36.6  MCV 97.8 99.5  PLT 243 205   Cardiac Enzymes: No results for input(s): CKTOTAL, CKMB, CKMBINDEX, TROPONINI in the last 72 hours. BNP: Invalid input(s): POCBNP D-Dimer: No results for input(s): DDIMER in the last 72 hours. Hemoglobin A1C: No results for input(s): HGBA1C in the last 72 hours. Fasting Lipid Panel: No results for input(s): CHOL, HDL, LDLCALC, TRIG, CHOLHDL, LDLDIRECT in the last 72 hours. Thyroid Function Tests: No results for input(s): TSH, T4TOTAL, T3FREE, THYROIDAB in the last 72 hours.  Invalid input(s): FREET3 Anemia Panel: No results for input(s): VITAMINB12, FOLATE, FERRITIN, TIBC, IRON, RETICCTPCT in the last 72 hours.   PHYSICAL EXAM General: Well developed, well nourished, in no acute distress HEENT:  Normocephalic and atramatic Neck:  No JVD.  Lungs: Clear bilaterally to auscultation and percussion. Heart: HRRR . Normal S1 and S2 without gallops or murmurs.   Abdomen: Bowel sounds are positive, abdomen soft and non-tender  Msk:  Back normal, normal gait. Normal strength and tone for age. Extremities: No clubbing, cyanosis or edema.   Neuro: Alert and oriented X 3. Psych:  Good affect, responds appropriately  TELEMETRY: Sinus rhythm  ASSESSMENT AND PLAN: Status post PCI with drug-eluting stent of the mid LAD and non-STEMI.  Patient was getting fluid all night and got short of breath and advise discontinuing fluid.  Patient can be discharged with follow-up next Tuesday at 57 in my office on Brilinta aspirin and rest of the home medication.  Principal Problem:   NSTEMI (non-ST elevated myocardial infarction) ([place]) Active Problems:   Actinic keratosis   Hyperlipidemia   Essential hypertension   Panlobular emphysema (HCC)   Hx of smoking   Primary osteoarthritis of both hands   Acid reflux   Senile purpura (HCC)   Nocturnal hypoxemia due to emphysema (HCC)   Acute systolic CHF (congestive heart failure) (HCC)   Elevated troponin    Neoma Laming A, MD, [facility] 09/13/2021 9:44 AM

## 2021-09-13 NOTE — Progress Notes (Deleted)
Name: Samantha Dawson   MRN: [id number]    DOB: 07-02-1929   Date:09/13/2021       Progress Note  Subjective  Chief Complaint  Difficulty Breathing  I connected with  Samantha Dawson on 09/13/21 at 11:40 AM EST by telephone and verified that I am speaking with the correct person using two identifiers.  I discussed the limitations, risks, security and privacy concerns of performing an evaluation and management service by telephone and the availability of in person appointments. Staff also discussed with the patient that there may be a patient responsible charge related to this service. Patient agreed on having a virtual visit  Patient Location: *** Provider Location: *** Additional Individuals present: ***  HPI  *** Patient Active Problem List   Diagnosis Date Noted   Elevated troponin 09/10/2021   CHF (congestive heart failure), NYHA class IV, acute, diastolic ([place]) 79/12/4095   Thrombocytopenia ([place]) 35/32/9924   Acute systolic CHF (congestive heart failure) ([place]) 08/23/2021   Hypertensive emergency 08/23/2021   NSTEMI (non-ST elevated myocardial infarction) ([place]) 08/18/2021   History of nonmelanoma skin cancer 06/06/2020   Senile purpura ([place]) 05/04/2020   Nocturnal hypoxemia due to emphysema ([place]) 05/04/2020   Carotid atherosclerosis, bilateral 08/27/2018   Bilateral carotid bruits 08/27/2018   CKD (chronic kidney disease) stage 3, GFR 30-59 ml/min (H[facility]) 08/27/2018   Age-related osteoporosis without current pathological fracture 08/11/2018   Shoulder pain, left 08/29/2017   Acid reflux 26/83/4196   Uncomplicated opioid use 22/29/7989   Hyperglycemia 09/02/2016   Left thyroid nodule 07/13/2016   Abnormal bone scan of cervical spine 06/18/2016   Primary osteoarthritis of both hands 05/03/2016   Elevated serum alkaline phosphatase level 05/03/2016   Hyperkalemia 05/03/2016   Degenerative disc disease, thoracic 05/03/2016   Hx of smoking 04/17/2016   Controlled substance  agreement signed 03/22/2016   Back pain 03/22/2016   Hyperlipidemia 10/25/2015   Essential hypertension 10/25/2015   Panlobular emphysema ([place]) 10/25/2015   Chronic pain 05/25/2015   Actinic keratosis 05/25/2015    Past Surgical History:  Procedure Laterality Date   ABDOMINAL HYSTERECTOMY     CATARACT EXTRACTION     CORONARY STENT INTERVENTION N/A 09/12/2021   Procedure: CORONARY STENT INTERVENTION;  Surgeon: Wellington Hampshire, MD;  Location: [facility];  Service: Cardiovascular;  Laterality: N/A;   endaryerectomy     LEFT HEART CATH AND CORONARY ANGIOGRAPHY N/A 09/12/2021   Procedure: LEFT HEART CATH AND CORONARY ANGIOGRAPHY;  Surgeon: Wellington Hampshire, MD;  Location: [facility];  Service: Cardiovascular;  Laterality: N/A;   renal stenting      Family History  Problem Relation Age of Onset   Cancer Mother    Coronary artery disease Father    Cerebrovascular Accident Father    Cancer Sister        breast cancer   Cancer Brother        stomach   Heart disease Daughter        heart vavle bypass?   Stroke Sister    Lung disease Brother    Cancer Brother        unknown   Arthritis Daughter     Social History   Socioeconomic History   Marital status: Widowed    Spouse name: Ihor Gully   Number of children: 3   Years of education: Not on file   Highest education level: 10th grade  Occupational History   Occupation: Retired  Tobacco Use   Smoking  status: Former    Packs/day: 1.00    Years: 50.00    Pack years: 50.00    Types: Cigarettes    Quit date: 2017    Years since quitting: 5.8    Passive exposure: Never   Smokeless tobacco: Never   Tobacco comments:    smoking cesssation materials not required  Vaping Use   Vaping Use: Never used  Substance and Sexual Activity   Alcohol use: No    Alcohol/week: 0.0 standard drinks   Drug use: No   Sexual activity: Not Currently    Birth control/protection: None  Other Topics Concern   Not on file   Social History Narrative   Pt lives alone   Social Determinants of Health   Financial Resource Strain: Low Risk    Difficulty of Paying Living Expenses: Not hard at all  Food Insecurity: No Food Insecurity   Worried About Charity fundraiser in the Last Year: Never true   Ran Out of Food in the Last Year: Never true  Transportation Needs: No Transportation Needs   Lack of Transportation (Medical): No   Lack of Transportation (Non-Medical): No  Physical Activity: Inactive   Days of Exercise per Week: 0 days   Minutes of Exercise per Session: 0 min  Stress: No Stress Concern Present   Feeling of Stress : Only a little  Social Connections: Moderately Isolated   Frequency of Communication with Friends and Family: More than three times a week   Frequency of Social Gatherings with Friends and Family: More than three times a week   Attends Religious Services: More than 4 times per year   Active Member of [employer] or Organizations: No   Attends Archivist Meetings: Never   Marital Status: Widowed  Human resources officer Violence: Not At Risk   Fear of Current or Ex-Partner: No   Emotionally Abused: No   Physically Abused: No   Sexually Abused: No    No current facility-administered medications for this visit. No current outpatient medications on file.  Facility-Administered Medications Ordered in Other Visits:    0.9 %  sodium chloride infusion, 250 mL, Intravenous, PRN, Neoma Laming A, MD   0.9 %  sodium chloride infusion, 250 mL, Intravenous, PRN, Wellington Hampshire, MD   acetaminophen (TYLENOL) tablet 650 mg, 650 mg, Oral, Q4H PRN, Neoma Laming A, MD, 650 mg at 09/12/21 1133   albuterol (PROVENTIL) (2.5 MG/3ML) 0.083% nebulizer solution 3 mL, 3 mL, Inhalation, Q6H PRN, Kathlyn Sacramento A, MD, 3 mL at 09/13/21 0516   aspirin chewable tablet 81 mg, 81 mg, Oral, Daily, Kathlyn Sacramento A, MD, 81 mg at 09/13/21 0909   heparin injection 5,000 Units, 5,000 Units, Subcutaneous, Q8H,  Elodia Florence., MD, 5,000 Units at 09/13/21 1434   HYDROcodone-acetaminophen (NORCO/VICODIN) 5-325 MG per tablet 1 tablet, 1 tablet, Oral, TID PRN, Wellington Hampshire, MD, 1 tablet at 09/13/21 0918   metoprolol tartrate (LOPRESSOR) tablet 12.5 mg, 12.5 mg, Oral, BID, Elodia Florence., MD, 12.5 mg at 09/11/21 2206   nitroGLYCERIN (NITROSTAT) SL tablet 0.4 mg, 0.4 mg, Sublingual, Q5 Min x 3 PRN, Arida, Muhammad A, MD   ondansetron (ZOFRAN) tablet 4 mg, 4 mg, Oral, Q6H PRN **OR** ondansetron (ZOFRAN) injection 4 mg, 4 mg, Intravenous, Q6H PRN, Fletcher Anon, Muhammad A, MD   ondansetron (ZOFRAN) injection 4 mg, 4 mg, Intravenous, Q6H PRN, Neoma Laming A, MD   pantoprazole (PROTONIX) EC tablet 40 mg, 40 mg, Oral, Daily, Kathlyn Sacramento  A, MD, 40 mg at 09/13/21 0909   rosuvastatin (CRESTOR) tablet 10 mg, 10 mg, Oral, Daily, Arida, Muhammad A, MD, 10 mg at 09/13/21 0909   sodium chloride flush (NS) 0.9 % injection 3 mL, 3 mL, Intravenous, Q12H, Arida, Muhammad A, MD, 3 mL at 09/11/21 2213   sodium chloride flush (NS) 0.9 % injection 3 mL, 3 mL, Intravenous, Q12H, Neoma Laming A, MD, 3 mL at 09/13/21 0918   sodium chloride flush (NS) 0.9 % injection 3 mL, 3 mL, Intravenous, PRN, Neoma Laming A, MD   sodium chloride flush (NS) 0.9 % injection 3 mL, 3 mL, Intravenous, Q12H, Arida, Muhammad A, MD, 3 mL at 09/13/21 0917   sodium chloride flush (NS) 0.9 % injection 3 mL, 3 mL, Intravenous, PRN, Wellington Hampshire, MD   ticagrelor (BRILINTA) tablet 90 mg, 90 mg, Oral, BID, Arida, Muhammad A, MD, 90 mg at 09/13/21 0909   umeclidinium-vilanterol (ANORO ELLIPTA) 62.5-25 MCG/ACT 1 puff, 1 puff, Inhalation, Q0600, Wellington Hampshire, MD, 1 puff at 09/13/21 0519  Allergies  Allergen Reactions   Alendronate Nausea Only    I personally reviewed active problem list, medication list, allergies, family history, social history, health maintenance with the patient/caregiver  today.   ROS ***  Objective  Virtual encounter, vitals not obtained.  There is no height or weight on file to calculate BMI.  Physical Exam ***  Results for orders placed or performed during the hospital encounter of 09/09/21 (from the past 72 hour(s))  Heparin level (unfractionated)     Status: None   Collection Time: 09/11/21  5:32 AM  Result Value Ref Range   Heparin Unfractionated 0.48 0.30 - 0.70 IU/mL    Comment: (NOTE) The clinical reportable range upper limit is being lowered to >1.10 to align with the FDA approved guidance for the current laboratory assay.  If heparin results are below expected values, and patient dosage has  been confirmed, suggest follow up testing of antithrombin III levels. Performed at [facility], [address] [place], [place] [address]   CBC with Differential/Platelet     Status: None   Collection Time: 09/11/21  5:32 AM  Result Value Ref Range   WBC 9.4 4.0 - 10.5 K/uL   RBC 3.99 3.87 - 5.11 MIL/uL   Hemoglobin 13.3 12.0 - 15.0 g/dL   HCT 38.7 36.0 - 46.0 %   MCV 97.0 80.0 - 100.0 fL   MCH 33.3 26.0 - 34.0 pg   MCHC 34.4 30.0 - 36.0 g/dL   RDW 15.1 11.5 - 15.5 %   Platelets 272 150 - 400 K/uL   nRBC 0.0 0.0 - 0.2 %   Neutrophils Relative % 65 %   Neutro Abs 6.1 1.7 - 7.7 K/uL   Lymphocytes Relative 21 %   Lymphs Abs 2.0 0.7 - 4.0 K/uL   Monocytes Relative 11 %   Monocytes Absolute 1.0 0.1 - 1.0 K/uL   Eosinophils Relative 2 %   Eosinophils Absolute 0.2 0.0 - 0.5 K/uL   Basophils Relative 1 %   Basophils Absolute 0.1 0.0 - 0.1 K/uL   Immature Granulocytes 0 %   Abs Immature Granulocytes 0.04 0.00 - 0.07 K/uL    Comment: Performed at [facility], [address] [place], [place] [address]  Comprehensive metabolic panel     Status: Abnormal   Collection Time: 09/11/21  5:32 AM  Result Value Ref Range   Sodium 140 135 - 145 mmol/L   Potassium 3.5  3.5 - 5.1 mmol/L   Chloride 102 98 - 111 mmol/L   CO2  27 22 - 32 mmol/L   Glucose, Bld 152 (H) 70 - 99 mg/dL    Comment: Glucose reference range applies only to samples taken after fasting for at least 8 hours.   BUN 25 (H) 8 - 23 mg/dL   Creatinine, Ser 1.33 (H) 0.44 - 1.00 mg/dL   Calcium 9.2 8.9 - 10.3 mg/dL   Total Protein 7.7 6.5 - 8.1 g/dL   Albumin 3.6 3.5 - 5.0 g/dL   AST 36 15 - 41 U/L   ALT 22 0 - 44 U/L   Alkaline Phosphatase 143 (H) 38 - 126 U/L   Total Bilirubin 1.9 (H) 0.3 - 1.2 mg/dL   GFR, Estimated 38 (L) >60 mL/min    Comment: (NOTE) Calculated using the CKD-EPI Creatinine Equation (2021)    Anion gap 11 5 - 15    Comment: Performed at [facility], [address] [place], [place] [address]  Magnesium     Status: None   Collection Time: 09/11/21  5:32 AM  Result Value Ref Range   Magnesium 2.2 1.7 - 2.4 mg/dL    Comment: Performed at [facility], [address], [place], [place] [address]  Phosphorus     Status: Abnormal   Collection Time: 09/11/21  5:32 AM  Result Value Ref Range   Phosphorus 2.3 (L) 2.5 - 4.6 mg/dL    Comment: Performed at [facility], [place], [place] [address]  Heparin level (unfractionated)     Status: None   Collection Time: 09/12/21  4:57 AM  Result Value Ref Range   Heparin Unfractionated 0.53 0.30 - 0.70 IU/mL    Comment: (NOTE) The clinical reportable range upper limit is being lowered to >1.10 to align with the FDA approved guidance for the current laboratory assay.  If heparin results are below expected values, and patient dosage has  been confirmed, suggest follow up testing of antithrombin III levels. Performed at [facility], [address] [place], [place] [address]   CBC with Differential/Platelet     Status: Abnormal   Collection Time: 09/12/21  4:57 AM  Result Value Ref Range   WBC 8.4 4.0 - 10.5 K/uL   RBC 3.67 (L) 3.87 - 5.11 MIL/uL   Hemoglobin 12.0 12.0 - 15.0 g/dL   HCT 35.9 (L) 36.0 - 46.0 %   MCV 97.8  80.0 - 100.0 fL   MCH 32.7 26.0 - 34.0 pg   MCHC 33.4 30.0 - 36.0 g/dL   RDW 15.2 11.5 - 15.5 %   Platelets 243 150 - 400 K/uL   nRBC 0.0 0.0 - 0.2 %   Neutrophils Relative % 62 %   Neutro Abs 5.3 1.7 - 7.7 K/uL   Lymphocytes Relative 23 %   Lymphs Abs 1.9 0.7 - 4.0 K/uL   Monocytes Relative 12 %   Monocytes Absolute 1.0 0.1 - 1.0 K/uL   Eosinophils Relative 2 %   Eosinophils Absolute 0.2 0.0 - 0.5 K/uL   Basophils Relative 1 %   Basophils Absolute 0.1 0.0 - 0.1 K/uL   Immature Granulocytes 0 %   Abs Immature Granulocytes 0.03 0.00 - 0.07 K/uL    Comment: Performed at [facility], [address], [place], [place] [address]  Comprehensive metabolic panel     Status: Abnormal   Collection Time: 09/12/21  4:57 AM  Result Value Ref Range  Sodium 140 135 - 145 mmol/L   Potassium 3.0 (L) 3.5 - 5.1 mmol/L   Chloride 99 98 - 111 mmol/L   CO2 32 22 - 32 mmol/L   Glucose, Bld 117 (H) 70 - 99 mg/dL    Comment: Glucose reference range applies only to samples taken after fasting for at least 8 hours.   BUN 27 (H) 8 - 23 mg/dL   Creatinine, Ser 1.42 (H) 0.44 - 1.00 mg/dL   Calcium 9.1 8.9 - 10.3 mg/dL   Total Protein 7.1 6.5 - 8.1 g/dL   Albumin 3.4 (L) 3.5 - 5.0 g/dL   AST 67 (H) 15 - 41 U/L   ALT 22 0 - 44 U/L   Alkaline Phosphatase 129 (H) 38 - 126 U/L   Total Bilirubin 1.7 (H) 0.3 - 1.2 mg/dL   GFR, Estimated 35 (L) >60 mL/min    Comment: (NOTE) Calculated using the CKD-EPI Creatinine Equation (2021)    Anion gap 9 5 - 15    Comment: Performed at [facility], [address] [place], [place] [address]  Magnesium     Status: None   Collection Time: 09/12/21  4:57 AM  Result Value Ref Range   Magnesium 2.2 1.7 - 2.4 mg/dL    Comment: Performed at [facility], [address] [place], [place] [address]  Phosphorus     Status: None   Collection Time: 09/12/21  4:57 AM  Result Value Ref Range   Phosphorus 3.4 2.5 - 4.6 mg/dL    Comment: Performed  at [facility], [place], [place] [address]  Troponin I (High Sensitivity)     Status: Abnormal   Collection Time: 09/12/21  4:57 AM  Result Value Ref Range   Troponin I (High Sensitivity) 7,376 (HH) <18 ng/L    Comment: CRITICAL VALUE NOTED. VALUE IS CONSISTENT WITH PREVIOUSLY REPORTED/CALLED VALUE DLB (NOTE) Elevated high sensitivity troponin I (hsTnI) values and significant  changes across serial measurements may suggest ACS but many other  chronic and acute conditions are known to elevate hsTnI results.  Refer to the "Links" section for chest pain algorithms and additional  guidance. Performed at [facility], [address] [place],  [address]   POCT Activated clotting time     Status: None   Collection Time: 09/12/21 10:17 AM  Result Value Ref Range   Activated Clotting Time 433 seconds    Comment: Reference range 74-137 seconds for patients not on anticoagulant therapy.  Glucose, capillary     Status: Abnormal   Collection Time: 09/12/21  8:59 PM  Result Value Ref Range   Glucose-Capillary 117 (H) 70 - 99 mg/dL    Comment: Glucose reference range applies only to samples taken after fasting for at least 8 hours.  CBC with Differential/Platelet     Status: Abnormal   Collection Time: 09/13/21  5:55 AM  Result Value Ref Range   WBC 9.0 4.0 - 10.5 K/uL   RBC 3.68 (L) 3.87 - 5.11 MIL/uL   Hemoglobin 12.0 12.0 - 15.0 g/dL   HCT 36.6 36.0 - 46.0 %   MCV 99.5 80.0 - 100.0 fL   MCH 32.6 26.0 - 34.0 pg   MCHC 32.8 30.0 - 36.0 g/dL   RDW 15.5 11.5 - 15.5 %   Platelets 205 150 - 400 K/uL   nRBC 0.0 0.0 - 0.2 %   Neutrophils Relative % 71 %   Neutro Abs 6.4 1.7 - 7.7 K/uL  Lymphocytes Relative 15 %   Lymphs Abs 1.3 0.7 - 4.0 K/uL   Monocytes Relative 12 %   Monocytes Absolute 1.1 (H) 0.1 - 1.0 K/uL   Eosinophils Relative 2 %   Eosinophils Absolute 0.2 0.0 - 0.5 K/uL   Basophils Relative 0 %   Basophils Absolute 0.0 0.0 - 0.1 K/uL    Immature Granulocytes 0 %   Abs Immature Granulocytes 0.03 0.00 - 0.07 K/uL    Comment: Performed at [facility], [address] [place], [place] [address]  Comprehensive metabolic panel     Status: Abnormal   Collection Time: 09/13/21  5:55 AM  Result Value Ref Range   Sodium 141 135 - 145 mmol/L   Potassium 4.5 3.5 - 5.1 mmol/L   Chloride 106 98 - 111 mmol/L   CO2 24 22 - 32 mmol/L   Glucose, Bld 121 (H) 70 - 99 mg/dL    Comment: Glucose reference range applies only to samples taken after fasting for at least 8 hours.   BUN 24 (H) 8 - 23 mg/dL   Creatinine, Ser 1.32 (H) 0.44 - 1.00 mg/dL   Calcium 8.9 8.9 - 10.3 mg/dL   Total Protein 7.0 6.5 - 8.1 g/dL   Albumin 3.3 (L) 3.5 - 5.0 g/dL   AST 66 (H) 15 - 41 U/L   ALT 21 0 - 44 U/L   Alkaline Phosphatase 123 38 - 126 U/L   Total Bilirubin 1.9 (H) 0.3 - 1.2 mg/dL   GFR, Estimated 39 (L) >60 mL/min    Comment: (NOTE) Calculated using the CKD-EPI Creatinine Equation (2021)    Anion gap 11 5 - 15    Comment: Performed at [facility], [address] [place], [place] [address]  Magnesium     Status: None   Collection Time: 09/13/21  5:55 AM  Result Value Ref Range   Magnesium 2.3 1.7 - 2.4 mg/dL    Comment: Performed at [facility], [address] [place], [place] [address]  Phosphorus     Status: None   Collection Time: 09/13/21  5:55 AM  Result Value Ref Range   Phosphorus 2.6 2.5 - 4.6 mg/dL    Comment: Performed at [facility], [address] [place], [place] [address]    PHQ2/9: Depression screen [facility] 2/9 08/31/2021 08/29/2021 06/01/2021 03/02/2021 02/10/2021  Decreased Interest 0 0 0 0 0  Down, Depressed, Hopeless 0 0 0 0 0  PHQ - 2 Score 0 0 0 0 0  Altered sleeping 0 0 - - -  Tired, decreased energy 0 0 - - -  Change in appetite 0 0 - - -  Feeling bad or failure about yourself  0 0 - - -  Trouble concentrating 0 0 - - -  Moving slowly or fidgety/restless 0 0 - - -   Suicidal thoughts 0 0 - - -  PHQ-9 Score 0 0 - - -  Difficult doing work/chores - - - - -  Some recent data might be hidden   PHQ-2/9 Result is {gen negative/positive:[id number]}.    Fall Risk: Fall Risk  08/31/2021 08/29/2021 06/01/2021 03/02/2021 02/10/2021  Falls in the past year? 0 0 0 0 0  Number falls in past yr: 0 0 0 0 0  Injury with Fall? 0 0 0 0 0  Comment - - - - -  Risk Factor Category  - - - - -  Risk for fall due to : Impaired mobility Impaired  mobility - - No Fall Risks  Risk for fall due to: Comment - - - - -  Follow up Falls prevention discussed Falls prevention discussed Falls evaluation completed - Falls prevention discussed     Assessment & Plan  There are no diagnoses linked to this encounter.  I discussed the assessment and treatment plan with the patient. The patient was provided an opportunity to ask questions and all were answered. The patient agreed with the plan and demonstrated an understanding of the instructions.   The patient was advised to call back or seek an in-person evaluation if the symptoms worsen or if the condition fails to improve as anticipated.  I provided *** minutes of non-face-to-face time during this encounter.  Steele Sizer, MD

## 2021-09-13 NOTE — Progress Notes (Signed)
Pt c/o severe SOB. Scheduled inhaler given. O2 increased to 5L, and RT notified for duoneb.

## 2021-09-13 NOTE — Progress Notes (Signed)
PROGRESS NOTE    Samantha Dawson  ONG:[id number] DOB: [birth year] DOA: 09/09/2021 PCP: Samantha Sizer, MD   Chief Complaint  Patient presents with   Shortness of Breath    Brief Narrative:  Samantha Dawson is an 87 year old pleasant, AAOx4, female with CAD, combined heart failure, history of hypertension, history of tobacco use, emphysema, who is being admitted for presumed combined heart failure exacerbation, complicated by NSTEMI.  Now s/p catheterization.   Assessment & Plan:   Principal Problem:   NSTEMI (non-ST elevated myocardial infarction) ([place]) Active Problems:   Actinic keratosis   Hyperlipidemia   Essential hypertension   Panlobular emphysema (HCC)   Hx of smoking   Primary osteoarthritis of both hands   Acid reflux   Senile purpura (HCC)   Nocturnal hypoxemia due to emphysema (HCC)   Acute systolic CHF (congestive heart failure) (HCC)   Elevated troponin  # Acute on chronic Hypoxic Respiratory Failure  HFrEF Exacerbation  Shortness of breath - wean O2 as tolerated - she's on 2 L chronically at home -- O2 increased to 5 L last night -- CXR tihis AM concerning for multilobar bilateral bronchopneumonia -- she doesn't have any fever or infectious symptoms, suspect this represents HF -  blood pressure soft today, will order lasix with holding parameters and follow - Strict I's and O's, daily weights  # NSTEMI  CAD -- troponin peaked at 7376 this AM -- LHC with severe 2 vessel CAD with chronic occlusion of proxima/mid L circumflex with left to left collaterals and severe 99% stenosis in proximal LAD which is likely culprit for NSTEMI -> s/p angioplastic and DES to proximal LAD (25% residual stenosis) -- see report -- continue aspirin, brilinta - needs DAPT at least 12 months -- continue imdur, metoprolol, ranexa, crestor, nitro sl -- brilinta on hold --> resume per discussion with Dr. Humphrey Dawson -- case attempted from R femoral access initially, but unable to advance wire  and femoral access aborted -> case performed by Samantha Dawson with R radial artery access   # Acute kidney injury --baseline creatinine <1, 1.32 today, improved from yesterday --in setting of HF and diuresis, will follow closely with diuresis and contrast -UA bland --renal US - negative for hydro   # Hyperlipidemia-rosuvastatin 20 mg daily, may need to adjust dose with AKI   # Emphysema-maintenance inhalers have been resumed   # Debility-difficulty ambulating - needs therapy eval prior to discharge    DVT prophylaxis:heparin Code Status:DNR Family Communication: granddaughter at bedside Disposition:   Status is: Inpatient  Remains inpatient appropriate because: need for therapy eval, wean O2       Consultants:  cardiology  Procedures:    Ost LM lesion is 30% stenosed.   Prox Cx to Mid Cx lesion is 100% stenosed.   Prox LAD to Mid LAD lesion is 99% stenosed.   Mid LAD to Dist LAD lesion is 30% stenosed.   Samantha Dawson drug-eluting stent was successfully placed using Samantha Dawson STENT ONYX FRONTIER 2.5X15.   Post intervention, there is Samantha Dawson 25% residual stenosis.   1.  Severe two-vessel coronary artery disease with chronic occlusion of proximal/mid left circumflex with left to left collaterals and severe 99% stenosis in the proximal LAD which is the likely culprit for non-ST elevation myocardial infarction. 2.  Left ventricular angiography was not performed due to underlying chronic kidney disease. 3.  Severely elevated left ventricular end-diastolic pressure at 31 mmHg. 4.  Successful angioplasty and drug-eluting stent placement to the proximal LAD.  Suboptimal results due to inability to fully expand the stent proximally due to heavy calcifications.  The procedure was limited by Samantha Dawson 5 [place] guide system given lack of alternative vascular access.  Samantha Dawson small first diagonal was jailed by the stent with TIMI I flow but the vessel was too small to rescue.   Recommendations: Dual antiplatelet therapy for at  least 12 months. Will give 1 dose of IV furosemide given shortness of breath and significantly elevated left ventricular end-diastolic pressure. Aggressive treatment of risk factors. Please note that the case was attempted initially from the right femoral access by Dr. Humphrey Dawson but was not able to advance the wire more than few centimeters and thus femoral access was aborted and I elected for right radial artery access in spite of small right radial artery size by ultrasound.  Antimicrobials:  Anti-infectives (From admission, onward)    None          Subjective: Feels better  Objective: Vitals:   09/13/21 0521 09/13/21 0525 09/13/21 0756 09/13/21 0910  BP:   (!) 85/67 (!) 95/58  Pulse:   96   Resp:   18   Temp:   98.2 F (36.8 C)   TempSrc:      SpO2: 96% 96% 98%   Weight:      Height:        Intake/Output Summary (Last 24 hours) at 09/13/2021 0946 Last data filed at 09/12/2021 1700 Gross per 24 hour  Intake --  Output 450 ml  Net -450 ml   Filed Weights   09/09/21 1400 09/11/21 1342  Weight: 46.3 kg 45.6 kg    Examination:  General: No acute distress. Cardiovascular: RRR Lungs: unlabored, diminished breath sounds Abdomen: Soft, nontender, nondistended  Neurological: Alert and oriented 3. Moves all extremities 4 . Cranial nerves II through XII grossly intact. Skin: Warm and dry. No rashes or lesions. Extremities: No clubbing or cyanosis. No edema.    Data Reviewed: I have personally reviewed following labs and imaging studies  CBC: Recent Labs  Lab 09/09/21 1402 09/10/21 0647 09/11/21 0532 09/12/21 0457 09/13/21 0555  WBC 7.9 7.7 9.4 8.4 9.0  NEUTROABS 5.9  --  6.1 5.3 6.4  HGB 12.1 12.1 13.3 12.0 12.0  HCT 36.7 35.8* 38.7 35.9* 36.6  MCV 98.7 98.1 97.0 97.8 99.5  PLT 246 234 272 243 371    Basic Metabolic Panel: Recent Labs  Lab 09/09/21 1402 09/09/21 1956 09/10/21 0647 09/11/21 0532 09/12/21 0457 09/13/21 0555  NA 142  --  141 140 140  141  K 3.8  --  2.9* 3.5 3.0* 4.5  CL 105  --  101 102 99 106  CO2 27  --  31 27 32 24  GLUCOSE 175*  --  122* 152* 117* 121*  BUN 27*  --  25* 25* 27* 24*  CREATININE 1.14*  --  1.21* 1.33* 1.42* 1.32*  CALCIUM 9.8  --  9.1 9.2 9.1 8.9  MG  --  2.2  --  2.2 2.2 2.3  PHOS  --  2.0*  --  2.3* 3.4 2.6    GFR: Estimated Creatinine Clearance: 20.8 mL/min (Joye Wesenberg) (by C-G formula based on SCr of 1.32 mg/dL (H)).  Liver Function Tests: Recent Labs  Lab 09/09/21 1402 09/11/21 0532 09/12/21 0457 09/13/21 0555  AST 34 36 67* 66*  ALT 22 22 22 21   ALKPHOS 145* 143* 129* 123  BILITOT 1.5* 1.9* 1.7* 1.9*  PROT 7.2 7.7 7.1 7.0  ALBUMIN  3.5 3.6 3.4* 3.3*    CBG: Recent Labs  Lab 09/12/21 2059  GLUCAP 117*     Recent Results (from the past 240 hour(s))  Resp Panel by RT-PCR (Flu Katieann Hungate&B, Covid) Nasopharyngeal Swab     Status: None   Collection Time: 09/09/21  6:51 PM   Specimen: Nasopharyngeal Swab; Nasopharyngeal(NP) swabs in vial transport medium  Result Value Ref Range Status   SARS Coronavirus 2 by RT PCR NEGATIVE NEGATIVE Final    Comment: (NOTE) SARS-CoV-2 target nucleic acids are NOT DETECTED.  The SARS-CoV-2 RNA is generally detectable in upper respiratory specimens during the acute phase of infection. The lowest concentration of SARS-CoV-2 viral copies this assay can detect is 138 copies/mL. Doha Boling negative result does not preclude SARS-Cov-2 infection and should not be used as the sole basis for treatment or other patient management decisions. Naomy Esham negative result may occur with  improper specimen collection/handling, submission of specimen other than nasopharyngeal swab, presence of viral mutation(s) within the areas targeted by this assay, and inadequate number of viral copies(<138 copies/mL). Zayda Angell negative result must be combined with clinical observations, patient history, and epidemiological information. The expected result is Negative.  Fact Sheet for Patients:   EntrepreneurPulse.com.au  Fact Sheet for Healthcare Providers:  IncredibleEmployment.be  This test is no t yet approved or cleared by the [place] FDA and  has been authorized for detection and/or diagnosis of SARS-CoV-2 by FDA under an Emergency Use Authorization (EUA). This EUA will remain  in effect (meaning this test can be used) for the duration of the COVID-19 declaration under Section 564(b)(1) of the Act, 21 U.S.C.section 360bbb-3(b)(1), unless the authorization is terminated  or revoked sooner.       Influenza Shaunette Gassner by PCR NEGATIVE NEGATIVE Final   Influenza B by PCR NEGATIVE NEGATIVE Final    Comment: (NOTE) The Xpert Xpress SARS-CoV-2/FLU/RSV plus assay is intended as an aid in the diagnosis of influenza from Nasopharyngeal swab specimens and should not be used as Becci Batty sole basis for treatment. Nasal washings and aspirates are unacceptable for Xpert Xpress SARS-CoV-2/FLU/RSV testing.  Fact Sheet for Patients: EntrepreneurPulse.com.au  Fact Sheet for Healthcare Providers: IncredibleEmployment.be  This test is not yet approved or cleared by the [place] FDA and has been authorized for detection and/or diagnosis of SARS-CoV-2 by FDA under an Emergency Use Authorization (EUA). This EUA will remain in effect (meaning this test can be used) for the duration of the COVID-19 declaration under Section 564(b)(1) of the Act, 21 U.S.C. section 360bbb-3(b)(1), unless the authorization is terminated or revoked.  Performed at [facility], [address], [place], [place] [address]          Radiology Studies: CARDIAC CATHETERIZATION  Result Date: 09/13/2021   Colon Flattery LM lesion is 30% stenosed.   Prox Cx to Mid Cx lesion is 100% stenosed.   Prox LAD to Mid LAD lesion is 99% stenosed.   Mid LAD to Dist LAD lesion is 30% stenosed.   Zailee Vallely drug-eluting stent was successfully placed using Allyce Bochicchio STENT  ONYX FRONTIER 2.5X15.   Post intervention, there is Janathan Bribiesca 25% residual stenosis. 1.  Severe two-vessel coronary artery disease with chronic occlusion of proximal/mid left circumflex with left to left collaterals and severe 99% stenosis in the proximal LAD which is the likely culprit for non-ST elevation myocardial infarction. 2.  Left ventricular angiography was not performed due to underlying chronic kidney disease. 3.  Severely elevated left ventricular end-diastolic pressure at 31 mmHg. 4.  Successful  angioplasty and drug-eluting stent placement to the proximal LAD.  Suboptimal results due to inability to fully expand the stent proximally due to heavy calcifications.  The procedure was limited by Chala Gul 5 [place] guide system given lack of alternative vascular access.  Mariyanna Mucha small first diagonal was jailed by the stent with TIMI I flow but the vessel was too small to rescue. Recommendations: Dual antiplatelet therapy for at least 12 months. Will give 1 dose of IV furosemide given shortness of breath and significantly elevated left ventricular end-diastolic pressure. Aggressive treatment of risk factors. Please note that the case was attempted initially from the right femoral access by Dr. Humphrey Dawson but was not able to advance the wire more than few centimeters and thus femoral access was aborted and I elected for right radial artery access in spite of small right radial artery size by ultrasound.   US RENAL  Result Date: 09/11/2021 CLINICAL DATA:  Acute renal insufficiency. EXAM: RENAL / URINARY TRACT ULTRASOUND COMPLETE COMPARISON:  None. FINDINGS: Right Kidney: Renal measurements: 9.3 x 4.2 x 4.6 cm = volume: 95 mL. Echogenicity within normal limits. No hydronephrosis visualized. There is Demaria Deeney simple cyst exophytic off of the lower right renal cortex measuring 1.8 cm in greatest dimension. Left Kidney: Renal measurements: 7.9 x 3.3 x 3.6 = volume: 48 mL. Echogenicity within normal limits. No hydronephrosis visualized. Two benign  cysts seen. There is Delmy Holdren 2.7 cm cyst in the midpole region of the left kidney and Ltanya Bayley 1.7 cm cyst, exophytic off of the lower pole of the left kidney. Bladder: Distended with slightly lobulated contour. Other: Bilateral pleural effusions. Heterogeneous echogenicity of the liver, partially visualized. IMPRESSION: Increased renal echogenicity, consistent with medical renal disease. Bilateral renal cysts. Heterogeneous echogenicity of the liver, partially visualized. Evaluation with right upper quadrant ultrasound or cross-sectional imaging may be considered. Bilateral pleural effusions. Electronically Signed   By: Fidela Salisbury M.D.   On: 09/11/2021 10:29   DG Chest Port 1 View  Result Date: 09/13/2021 CLINICAL DATA:  87 year old female with history of shortness of breath. EXAM: PORTABLE CHEST 1 VIEW COMPARISON:  Chest x-ray 09/11/2021. FINDINGS: Diffuse peribronchial cuffing, widespread interstitial prominence and patchy multifocal ill-defined airspace disease scattered throughout the lungs bilaterally, similar to the recent prior examination. Small bilateral pleural effusions. No pneumothorax. Heart size is normal. Upper mediastinal contours are within normal limits. Atherosclerotic calcifications in the thoracic aorta. IMPRESSION: 1. The appearance the chest is most compatible with multilobar bilateral bronchopneumonia. Aeration appears slightly worsened compared to the recent prior study. 2. Small bilateral pleural effusions. 3. Aortic atherosclerosis. Electronically Signed   By: Vinnie Langton M.D.   On: 09/13/2021 09:01        Scheduled Meds:  aspirin  81 mg Oral Daily   isosorbide mononitrate  30 mg Oral Daily   metoprolol tartrate  12.5 mg Oral BID   pantoprazole  40 mg Oral Daily   rosuvastatin  10 mg Oral Daily   sodium chloride flush  3 mL Intravenous Q12H   sodium chloride flush  3 mL Intravenous Q12H   sodium chloride flush  3 mL Intravenous Q12H   ticagrelor  90 mg Oral BID    umeclidinium-vilanterol  1 puff Inhalation Q0600   Continuous Infusions:  sodium chloride     sodium chloride       LOS: 3 days    Time spent: over 30 min    Fayrene Helper, MD [facility]   To contact the attending provider between 7A-7P  or the covering provider during after hours 7P-7A, please log into the web site [email or web address] and access using universal [place] password for that web site. If you do not have the password, please call the hospital operator.  09/13/2021, 9:46 AM

## 2021-09-13 NOTE — Progress Notes (Signed)
Physical Therapy Treatment Patient Details Name: Samantha Dawson MRN: [id number] DOB: May 12, 1929 Today's Date: 09/13/2021   History of Present Illness Pt is an 87 y/o F admitted on 09/09/21 with c/c of SOB. Pt is being treated for suspected combined heart failure exacerbation. Of note, pt was recently admitted & declined cardiac catheterization. Cardiology was consulted for this admission 2/2 NTSEMI & chest pain & at this time pt is agreeable to cardiac catheterization. PMH: CAD, HTN, heart failure reduced EF, OA, pan lobar emphysema, DDD, tobacco use, GERD, nonmelanoma skin CA    PT Comments    Pt received supine in bed agreeable to PT session. Resting on 2L/min comfortably. Unable to get baseline SPO2 sat reading but HR trending in low 100's at rest with pt reporting no SOB or difficulty breathing, denies chest pain. Just endorses R groin soreness from attempts of heart cath through R femoral artery. Pt supervision to transfer to EOB with STS to RW with safe hand placement and minguard. After ~20 sec pt endorsing significant SOB with LE's shaking requesting to sit. HR up to 115 BPM and desat of O2 to 78% to 80%. Education provided on PLB to attempt to raise sats with poor carryover. Ultimately required titration to 5L O2/min and >5 min seated rest to return to 90% but still hovering to 88-89% with LE exercise. Returned to 2 L/min prior to returning to supine. Due to continuous desat even with increased O2, deferred further mobility. Transferring to bed with supervision pt has additional episode of SOB while on 2 L requiring titration to 4L/min with SPO2 returning to 90% after 2-3 min supine. RN notified stating to leave pt on 4 L/min. Case management and attending MD notified on change of D/c recs as pt is not safe for OOB mobility due to significant SOB limiting pts ability to tolerate standing > 20 sec let alone ambulation. Will continue to follow pt and adjust follow up recs as appropriate per PT  session for safe TOC.   Recommendations for follow up therapy are one component of a multi-disciplinary discharge planning process, led by the attending physician.  Recommendations may be updated based on patient status, additional functional criteria and insurance authorization.  Follow Up Recommendations  Skilled nursing-short term rehab (<3 hours/day)     Assistance Recommended at Discharge Intermittent Supervision/Assistance  Equipment Recommendations  None recommended by PT    Recommendations for Other Services       Precautions / Restrictions Precautions Precautions: Fall Restrictions Weight Bearing Restrictions: No     Mobility  Bed Mobility Overal bed mobility: Needs Assistance Bed Mobility: Supine to Sit;Sit to Supine     Supine to sit: Supervision;HOB elevated Sit to supine: Supervision     Patient Response: Cooperative  Transfers Overall transfer level: Needs assistance Equipment used: Rolling walker (2 wheels) Transfers: Sit to/from Stand Sit to Stand: Min guard                Ambulation/Gait               General Gait Details: deferred due to SOB.   Stairs             Wheelchair Mobility    Modified Rankin (Stroke Patients Only)       Balance Overall balance assessment: Needs assistance Sitting-balance support: Bilateral upper extremity supported;Feet supported Sitting balance-Leahy Scale: Good     Standing balance support: Bilateral upper extremity supported;During functional activity Standing balance-Leahy Scale: Fair Standing balance comment: BUE  support on RW with supervision                            Cognition Arousal/Alertness: Awake/alert Behavior During Therapy: WFL for tasks assessed/performed Overall Cognitive Status: Within Functional Limits for tasks assessed                                          Exercises General Exercises - Lower Extremity Toe Raises:  AROM;Seated;Strengthening;Both;10 reps Heel Raises: AROM;Seated;Strengthening;Both;10 reps    General Comments General comments (skin integrity, edema, etc.): 2L desat to 78% with SOB. Required 5L/min to return to >90%.      Pertinent Vitals/Pain Pain Assessment: No/denies pain    Home Living                          Prior Function            PT Goals (current goals can now be found in the care plan section) Acute Rehab PT Goals Patient Stated Goal: get better PT Goal Formulation: With patient Time For Goal Achievement: 09/24/21 Potential to Achieve Goals: Good Progress towards PT goals: Progressing toward goals    Frequency    Min 2X/week      PT Plan Current plan remains appropriate    Co-evaluation              AM-PAC PT "6 Clicks" Mobility   Outcome Measure  Help needed turning from your back to your side while in a flat bed without using bedrails?: None Help needed moving from lying on your back to sitting on the side of a flat bed without using bedrails?: A Little Help needed moving to and from a bed to a chair (including a wheelchair)?: A Little Help needed standing up from a chair using your arms (e.g., wheelchair or bedside chair)?: A Lot Help needed to walk in hospital room?: Total Help needed climbing 3-5 steps with a railing? : Total 6 Click Score: 14    End of Session Equipment Utilized During Treatment: Gait belt;Oxygen Activity Tolerance: Patient tolerated treatment well Patient left: in bed;with call bell/phone within reach;with bed alarm set;with family/visitor present Nurse Communication: Mobility status PT Visit Diagnosis: Unsteadiness on feet (R26.81);Muscle weakness (generalized) (M62.81);Difficulty in walking, not elsewhere classified (R26.2)     Time: 9458-5929 PT Time Calculation (min) (ACUTE ONLY): 27 min  Charges:  $Therapeutic Activity: 23-37 mins                     Montario Zilka M. Fairly IV, PT, DPT Physical  Therapist- [facility]  09/13/2021, 12:47 PM

## 2021-09-14 ENCOUNTER — Telehealth: Payer: Medicare Other | Admitting: Family Medicine

## 2021-09-14 DIAGNOSIS — I214 Non-ST elevation (NSTEMI) myocardial infarction: Secondary | ICD-10-CM | POA: Diagnosis not present

## 2021-09-14 LAB — CBC WITH DIFFERENTIAL/PLATELET
Abs Immature Granulocytes: 0.05 10*3/uL (ref 0.00–0.07)
Basophils Absolute: 0 10*3/uL (ref 0.0–0.1)
Basophils Relative: 0 %
Eosinophils Absolute: 0.2 10*3/uL (ref 0.0–0.5)
Eosinophils Relative: 1 %
HCT: 34.2 % — ABNORMAL LOW (ref 36.0–46.0)
Hemoglobin: 11.2 g/dL — ABNORMAL LOW (ref 12.0–15.0)
Immature Granulocytes: 0 %
Lymphocytes Relative: 12 %
Lymphs Abs: 1.4 10*3/uL (ref 0.7–4.0)
MCH: 32.7 pg (ref 26.0–34.0)
MCHC: 32.7 g/dL (ref 30.0–36.0)
MCV: 100 fL (ref 80.0–100.0)
Monocytes Absolute: 1.3 10*3/uL — ABNORMAL HIGH (ref 0.1–1.0)
Monocytes Relative: 11 %
Neutro Abs: 8.9 10*3/uL — ABNORMAL HIGH (ref 1.7–7.7)
Neutrophils Relative %: 76 %
Platelets: 192 10*3/uL (ref 150–400)
RBC: 3.42 MIL/uL — ABNORMAL LOW (ref 3.87–5.11)
RDW: 15.8 % — ABNORMAL HIGH (ref 11.5–15.5)
WBC: 11.8 10*3/uL — ABNORMAL HIGH (ref 4.0–10.5)
nRBC: 0 % (ref 0.0–0.2)

## 2021-09-14 LAB — BRAIN NATRIURETIC PEPTIDE: B Natriuretic Peptide: 1716.2 pg/mL — ABNORMAL HIGH (ref 0.0–100.0)

## 2021-09-14 LAB — COMPREHENSIVE METABOLIC PANEL
ALT: 19 U/L (ref 0–44)
AST: 44 U/L — ABNORMAL HIGH (ref 15–41)
Albumin: 3.3 g/dL — ABNORMAL LOW (ref 3.5–5.0)
Alkaline Phosphatase: 119 U/L (ref 38–126)
Anion gap: 10 (ref 5–15)
BUN: 25 mg/dL — ABNORMAL HIGH (ref 8–23)
CO2: 26 mmol/L (ref 22–32)
Calcium: 8.9 mg/dL (ref 8.9–10.3)
Chloride: 105 mmol/L (ref 98–111)
Creatinine, Ser: 1.28 mg/dL — ABNORMAL HIGH (ref 0.44–1.00)
GFR, Estimated: 40 mL/min — ABNORMAL LOW (ref >60)
Glucose, Bld: 112 mg/dL — ABNORMAL HIGH (ref 70–99)
Potassium: 3.8 mmol/L (ref 3.5–5.1)
Sodium: 141 mmol/L (ref 135–145)
Total Bilirubin: 1.6 mg/dL — ABNORMAL HIGH (ref 0.3–1.2)
Total Protein: 7 g/dL (ref 6.5–8.1)

## 2021-09-14 LAB — PHOSPHORUS: Phosphorus: 2.9 mg/dL (ref 2.5–4.6)

## 2021-09-14 LAB — MAGNESIUM: Magnesium: 2.5 mg/dL — ABNORMAL HIGH (ref 1.7–2.4)

## 2021-09-14 LAB — PROCALCITONIN: Procalcitonin: 0.11 ng/mL

## 2021-09-14 MED ORDER — FUROSEMIDE 10 MG/ML IJ SOLN
40.0000 mg | Freq: Once | INTRAMUSCULAR | Status: AC
  Administered 2021-09-14: 40 mg via INTRAVENOUS
  Filled 2021-09-14: qty 4

## 2021-09-14 NOTE — Progress Notes (Signed)
PROGRESS NOTE    Samantha Dawson  YWV:[id number] DOB: 1929/07/13 DOA: 09/09/2021 PCP: Steele Sizer, MD  [id number]   Assessment & Plan:   Principal Problem:   NSTEMI (non-ST elevated myocardial infarction) [facility]) Active Problems:   Actinic keratosis   Hyperlipidemia   Essential hypertension   Panlobular emphysema (HCC)   Hx of smoking   Primary osteoarthritis of both hands   Acid reflux   Senile purpura (HCC)   Nocturnal hypoxemia due to emphysema (HCC)   Acute systolic CHF (congestive heart failure) ([place])   Elevated troponin    Samantha Dawson is an 87 year old female with hx of CAD, combined heart failure, hypertension, prior tobacco use, COPD on 2L at baseline, who was admitted for presumed combined heart failure exacerbation, complicated by NSTEMI.  Now s/p catheterization.   # Acute on chronic Hypoxic Respiratory Failure 2/2  Acute on chronic combined CHF exacerbation --88% on 2L, needed up to 5L O2 --received intermittent IV lasix when BP allowed -- CXR concerning for multilobar bilateral bronchopneumonia, however, pt doesn't have any fever or infectious symptoms, procal remained neg, suspect this represents HF. Plan: --IV lasix 40 x1 today --Continue supplemental O2 to keep sats between 88-92%, wean as tolerated --Hold abx   # NSTEMI  CAD -- troponin peaked at 7376  -- LHC with severe 2 vessel CAD with chronic occlusion of proxima/mid L circumflex with left to left collaterals and severe 99% stenosis in proximal LAD which is likely culprit for NSTEMI -> s/p angioplastic and DES to proximal LAD (25% residual stenosis)  -- continue aspirin, brilinta - needs DAPT at least 12 months --cont ASA, statin --cont metop   # Acute kidney injury --in setting of HF, diuresis, and contrast --renal US - negative for hydro --monitor Cr while diuresing   # Hyperlipidemia --cont statin   # COPD on 2L at baseline --cont home bronchodilator   # 1.5 cm right upper lobe  nodule with irregular or spiculated margin. --incidental finding on CT chest --oncology consult   DVT prophylaxis: Heparin SQ Code Status: DNR  Family Communication: called daughter [name]) and unable to reach.  Level of care: Progressive Cardiac Dispo:   The patient is from: home Anticipated d/c is to: SNF Anticipated d/c date is: 1-2 days Patient currently is not medically ready to d/c due to: pending oncology consult   Subjective and Interval History:  Pt reported breathing improved.     Objective: Vitals:   09/14/21 0419 09/14/21 0800 09/14/21 1100 09/14/21 1535  BP: (!) 103/58 107/66 106/64 (!) 102/45  Pulse: 87 95 81 85  Resp: 18 18 18 18   Temp: 97.9 F (36.6 C) 97.8 F (36.6 C) 97.6 F (36.4 C) 97.8 F (36.6 C)  TempSrc: Oral Oral Oral Oral  SpO2: 94% 100% 99% 98%  Weight:      Height:        Intake/Output Summary (Last 24 hours) at 09/14/2021 1924 Last data filed at 09/14/2021 1531 Gross per 24 hour  Intake 480 ml  Output 1000 ml  Net -520 ml   Filed Weights   09/09/21 1400 09/11/21 1342  Weight: 46.3 kg 45.6 kg    Examination:   Constitutional: NAD, AAOx3 HEENT: conjunctivae and lids normal, EOMI CV: No cyanosis.   RESP: normal respiratory effort, on 4L Extremities: No effusions, edema in BLE SKIN: warm, dry Neuro: II - XII grossly intact.   Psych: Normal mood and affect.  Appropriate judgement and reason   Data Reviewed:  I have personally reviewed following labs and imaging studies  CBC: Recent Labs  Lab 09/09/21 1402 09/10/21 0647 09/11/21 0532 09/12/21 0457 09/13/21 0555 09/14/21 0510  WBC 7.9 7.7 9.4 8.4 9.0 11.8*  NEUTROABS 5.9  --  6.1 5.3 6.4 8.9*  HGB 12.1 12.1 13.3 12.0 12.0 11.2*  HCT 36.7 35.8* 38.7 35.9* 36.6 34.2*  MCV 98.7 98.1 97.0 97.8 99.5 100.0  PLT 246 234 272 243 205 431   Basic Metabolic Panel: Recent Labs  Lab 09/09/21 1956 09/10/21 0647 09/11/21 0532 09/12/21 0457 09/13/21 0555 09/14/21 0510  NA   --  141 140 140 141 141  K  --  2.9* 3.5 3.0* 4.5 3.8  CL  --  101 102 99 106 105  CO2  --  31 27 32 24 26  GLUCOSE  --  122* 152* 117* 121* 112*  BUN  --  25* 25* 27* 24* 25*  CREATININE  --  1.21* 1.33* 1.42* 1.32* 1.28*  CALCIUM  --  9.1 9.2 9.1 8.9 8.9  MG 2.2  --  2.2 2.2 2.3 2.5*  PHOS 2.0*  --  2.3* 3.4 2.6 2.9   GFR: Estimated Creatinine Clearance: 21.4 mL/min (A) (by C-G formula based on SCr of 1.28 mg/dL (H)). Liver Function Tests: Recent Labs  Lab 09/09/21 1402 09/11/21 0532 09/12/21 0457 09/13/21 0555 09/14/21 0510  AST 34 36 67* 66* 44*  ALT 22 22 22 21 19   ALKPHOS 145* 143* 129* 123 119  BILITOT 1.5* 1.9* 1.7* 1.9* 1.6*  PROT 7.2 7.7 7.1 7.0 7.0  ALBUMIN 3.5 3.6 3.4* 3.3* 3.3*   No results for input(s): LIPASE, AMYLASE in the last 168 hours. No results for input(s): AMMONIA in the last 168 hours. Coagulation Profile: Recent Labs  Lab 09/09/21 1956  INR 1.1   Cardiac Enzymes: No results for input(s): CKTOTAL, CKMB, CKMBINDEX, TROPONINI in the last 168 hours. BNP (last 3 results) No results for input(s): PROBNP in the last 8760 hours. HbA1C: No results for input(s): HGBA1C in the last 72 hours. CBG: Recent Labs  Lab 09/12/21 2059  GLUCAP 117*   Lipid Profile: No results for input(s): CHOL, HDL, LDLCALC, TRIG, CHOLHDL, LDLDIRECT in the last 72 hours. Thyroid Function Tests: No results for input(s): TSH, T4TOTAL, FREET4, T3FREE, THYROIDAB in the last 72 hours. Anemia Panel: No results for input(s): VITAMINB12, FOLATE, FERRITIN, TIBC, IRON, RETICCTPCT in the last 72 hours. Sepsis Labs: Recent Labs  Lab 09/09/21 1956 09/14/21 0510  PROCALCITON <0.10 0.11    Recent Results (from the past 240 hour(s))  Resp Panel by RT-PCR (Flu A&B, Covid) Nasopharyngeal Swab     Status: None   Collection Time: 09/09/21  6:51 PM   Specimen: Nasopharyngeal Swab; Nasopharyngeal(NP) swabs in vial transport medium  Result Value Ref Range Status   SARS Coronavirus  2 by RT PCR NEGATIVE NEGATIVE Final    Comment: (NOTE) SARS-CoV-2 target nucleic acids are NOT DETECTED.  The SARS-CoV-2 RNA is generally detectable in upper respiratory specimens during the acute phase of infection. The lowest concentration of SARS-CoV-2 viral copies this assay can detect is 138 copies/mL. A negative result does not preclude SARS-Cov-2 infection and should not be used as the sole basis for treatment or other patient management decisions. A negative result may occur with  improper specimen collection/handling, submission of specimen other than nasopharyngeal swab, presence of viral mutation(s) within the areas targeted by this assay, and inadequate number of viral copies(<138 copies/mL). A negative result must be  combined with clinical observations, patient history, and epidemiological information. The expected result is Negative.  Fact Sheet for Patients:  EntrepreneurPulse.com.au  Fact Sheet for Healthcare Providers:  IncredibleEmployment.be  This test is no t yet approved or cleared by the [place] FDA and  has been authorized for detection and/or diagnosis of SARS-CoV-2 by FDA under an Emergency Use Authorization (EUA). This EUA will remain  in effect (meaning this test can be used) for the duration of the COVID-19 declaration under Section 564(b)(1) of the Act, 21 U.S.C.section 360bbb-3(b)(1), unless the authorization is terminated  or revoked sooner.       Influenza A by PCR NEGATIVE NEGATIVE Final   Influenza B by PCR NEGATIVE NEGATIVE Final    Comment: (NOTE) The Xpert Xpress SARS-CoV-2/FLU/RSV plus assay is intended as an aid in the diagnosis of influenza from Nasopharyngeal swab specimens and should not be used as a sole basis for treatment. Nasal washings and aspirates are unacceptable for Xpert Xpress SARS-CoV-2/FLU/RSV testing.  Fact Sheet for Patients: EntrepreneurPulse.com.au  Fact  Sheet for Healthcare Providers: IncredibleEmployment.be  This test is not yet approved or cleared by the [place] FDA and has been authorized for detection and/or diagnosis of SARS-CoV-2 by FDA under an Emergency Use Authorization (EUA). This EUA will remain in effect (meaning this test can be used) for the duration of the COVID-19 declaration under Section 564(b)(1) of the Act, 21 U.S.C. section 360bbb-3(b)(1), unless the authorization is terminated or revoked.  Performed at [facility], [address], [place], [place] [address]       Radiology Studies: CT CHEST WO CONTRAST  Result Date: 09/13/2021 CLINICAL DATA:  Chest pain and shortness of breath. EXAM: CT CHEST WITHOUT CONTRAST TECHNIQUE: Multidetector CT imaging of the chest was performed following the standard protocol without IV contrast. COMPARISON:  Chest radiograph dated 09/13/2021. FINDINGS: Evaluation of this exam is limited in the absence of intravenous contrast. Cardiovascular: Were top-normal cardiac size. No pericardial effusion. Advanced 3 vessel coronary vascular calcification and calcification of the mitral annulus. Four there is advanced atherosclerotic calcification of the thoracic aorta. The central pulmonary arteries are grossly unremarkable. Mediastinum/Nodes: No hilar or mediastinal adenopathy. The esophagus is grossly unremarkable. A 1.3 cm left thyroid hypodense lesion. In the setting of significant comorbidities or limited life expectancy, no follow-up recommended (ref: J Am Coll Radiol. 2015 Feb;12(2): 143-50).No mediastinal fluid collection. No focal or Lungs/Pleura: Small bilateral pleural effusions, left greater right. There is partial compressive atelectasis of the lower lobes versus pneumonia. Background of severe emphysema. Forty pole pole there is no pneumothorax. The central airways are patent. There is a 1.5 cm nodule in the right upper lobe with irregular or spiculated  margin. Multidisciplinary consult and further evaluation with PET-CT if clinically indicated, recommended. Upper Abdomen: Partially visualized 2.5 cm left renal cyst. Musculoskeletal: Osteopenia with degenerative changes of the spine. No acute osseous pathology. IMPRESSION: 1. Small bilateral pleural effusions, left greater right with partial compressive atelectasis of the lower lobes versus pneumonia. 2. A 1.5 cm right upper lobe nodule with irregular or spiculated margin. This is concerning for malignancy. Multidisciplinary consult and further evaluation with PET-CT, if clinically indicated, recommended. 3. Aortic Atherosclerosis (ICD10-I70.0) and Emphysema (ICD10-J43.9). Electronically Signed   By: Anner Crete M.D.   On: 09/13/2021 22:58   DG Chest Port 1 View  Result Date: 09/13/2021 CLINICAL DATA:  87 year old female with history of shortness of breath. EXAM: PORTABLE CHEST 1 VIEW COMPARISON:  Chest x-ray 09/11/2021. FINDINGS: Diffuse peribronchial  cuffing, widespread interstitial prominence and patchy multifocal ill-defined airspace disease scattered throughout the lungs bilaterally, similar to the recent prior examination. Small bilateral pleural effusions. No pneumothorax. Heart size is normal. Upper mediastinal contours are within normal limits. Atherosclerotic calcifications in the thoracic aorta. IMPRESSION: 1. The appearance the chest is most compatible with multilobar bilateral bronchopneumonia. Aeration appears slightly worsened compared to the recent prior study. 2. Small bilateral pleural effusions. 3. Aortic atherosclerosis. Electronically Signed   By: Vinnie Langton M.D.   On: 09/13/2021 09:01     Scheduled Meds:  aspirin  81 mg Oral Daily   heparin injection (subcutaneous)  5,000 Units Subcutaneous Q8H   metoprolol tartrate  12.5 mg Oral BID   pantoprazole  40 mg Oral Daily   rosuvastatin  10 mg Oral Daily   sodium chloride flush  3 mL Intravenous Q12H   sodium chloride flush   3 mL Intravenous Q12H   sodium chloride flush  3 mL Intravenous Q12H   ticagrelor  90 mg Oral BID   umeclidinium-vilanterol  1 puff Inhalation Q0600   Continuous Infusions:  sodium chloride     sodium chloride       LOS: 4 days     Enzo Bi, MD [facility] If 7PM-7AM, please contact night-coverage 09/14/2021, 7:24 PM

## 2021-09-14 NOTE — Progress Notes (Signed)
Physical Therapy Treatment Patient Details Name: Samantha Dawson MRN: [id number] DOB: [birth year] Today's Date: 09/14/2021   History of Present Illness Pt is an 87 y/o F admitted on 09/09/21 with c/c of SOB. Pt is being treated for suspected combined heart failure exacerbation. Of note, pt was recently admitted & declined cardiac catheterization. Cardiology was consulted for this admission 2/2 NTSEMI & chest pain & at this time pt is agreeable to cardiac catheterization. PMH: CAD, HTN, heart failure reduced EF, OA, pan lobar emphysema, DDD, tobacco use, GERD, nonmelanoma skin CA    PT Comments    Pt received upright in bed. Satting on 3L/min via [place] with SPO2 at 98%. Improved from previous session with ability to tolerate 2x25' of ambulation in room with SPO maintained >92% throughout. Pt does display minor SOB requiring seated rest between walking bouts. Minguard provided for STS transfers and with amb. Pt performed seated LE therex post amb for LE strengthening with good understanding of form/technique with exercises. Returned to supine in bed with supervision. Education provided on change in d/c recs due to decline in mobility previous session due to SOB. Educated on PT d/c recs to STR to improve pt's tolerance, endurance, and strengthening of LE's to return to PLOF but that ultimately the decision was hers. Pt educated that if she decided to go home, [facility] PT would be beneficial for the same reasoning mentioned above. Right now pt displaying deficits in walking tolerance due to SOB and fatigue thus will benefit from STR prior to returning to home environment.     Recommendations for follow up therapy are one component of a multi-disciplinary discharge planning process, led by the attending physician.  Recommendations may be updated based on patient status, additional functional criteria and insurance authorization.  Follow Up Recommendations  Skilled nursing-short term rehab (<3 hours/day)      Assistance Recommended at Discharge Intermittent Supervision/Assistance  Equipment Recommendations  None recommended by PT    Recommendations for Other Services       Precautions / Restrictions Precautions Precautions: Fall Restrictions Weight Bearing Restrictions: No     Mobility  Bed Mobility Overal bed mobility: Needs Assistance Bed Mobility: Supine to Sit;Sit to Supine     Supine to sit: Supervision;HOB elevated Sit to supine: Supervision     Patient Response: Cooperative  Transfers Overall transfer level: Needs assistance Equipment used: Rolling walker (2 wheels) Transfers: Sit to/from Stand Sit to Stand: Min guard           General transfer comment: Required cuing for hand placement but no physical assist needed.    Ambulation/Gait Ambulation/Gait assistance: Min guard Gait Distance (Feet): 50 Feet (2x25' with seated rest between bouts) Assistive device: Rolling walker (2 wheels) Gait Pattern/deviations: Decreased stride length;Decreased step length - right;Decreased step length - left       General Gait Details: Tolerated ambulation today with minor SOB on 3 L/min via [place]. Maintained SPO2 > 92% throughout.   Stairs             Wheelchair Mobility    Modified Rankin (Stroke Patients Only)       Balance Overall balance assessment: Needs assistance Sitting-balance support: Bilateral upper extremity supported;Feet supported Sitting balance-Leahy Scale: Good     Standing balance support: Bilateral upper extremity supported;During functional activity Standing balance-Leahy Scale: Fair Standing balance comment: BUE support on RW with supervision  Cognition Arousal/Alertness: Awake/alert Behavior During Therapy: WFL for tasks assessed/performed Overall Cognitive Status: Within Functional Limits for tasks assessed                                          Exercises General Exercises  - Lower Extremity Long Arc Quad: AROM;Seated;Strengthening;Both;10 reps Hip ABduction/ADduction: AROM;Strengthening;Both;10 reps;Seated Hip Flexion/Marching: AROM;Strengthening;Both;10 reps;Seated Toe Raises: AROM;Seated;Strengthening;Both;10 reps Heel Raises: AROM;Seated;Strengthening;Both;10 reps Other Exercises Other Exercises: education on HH PT vs SNF. Pursed lip breathing    General Comments General comments (skin integrity, edema, etc.): maintained SPO2 > 92% with amb on 3L/min      Pertinent Vitals/Pain Pain Assessment: No/denies pain    Home Living                          Prior Function            PT Goals (current goals can now be found in the care plan section) Acute Rehab PT Goals Patient Stated Goal: get better PT Goal Formulation: With patient Time For Goal Achievement: 09/24/21 Potential to Achieve Goals: Good Progress towards PT goals: Progressing toward goals    Frequency    Min 2X/week      PT Plan Current plan remains appropriate    Co-evaluation              AM-PAC PT "6 Clicks" Mobility   Outcome Measure  Help needed turning from your back to your side while in a flat bed without using bedrails?: None Help needed moving from lying on your back to sitting on the side of a flat bed without using bedrails?: A Little Help needed moving to and from a bed to a chair (including a wheelchair)?: A Little Help needed standing up from a chair using your arms (e.g., wheelchair or bedside chair)?: A Little Help needed to walk in hospital room?: A Little Help needed climbing 3-5 steps with a railing? : A Lot 6 Click Score: 18    End of Session Equipment Utilized During Treatment: Gait belt;Oxygen Activity Tolerance: Patient tolerated treatment well Patient left: in bed;with call bell/phone within reach;with bed alarm set;with family/visitor present Nurse Communication: Mobility status PT Visit Diagnosis: Unsteadiness on feet  (R26.81);Muscle weakness (generalized) (M62.81);Difficulty in walking, not elsewhere classified (R26.2)     Time: 9379-0240 PT Time Calculation (min) (ACUTE ONLY): 24 min  Charges:  $Therapeutic Exercise: 23-37 mins                     Vallory Oetken M. Fairly IV, PT, DPT Physical Therapist- [facility]  09/14/2021, 4:10 PM

## 2021-09-14 NOTE — NC FL2 (Signed)
[place] LEVEL OF CARE SCREENING TOOL     IDENTIFICATION  Patient Name: Samantha Dawson Birthdate: [birth year] Sex: female Admission Date (Current Location): 09/09/2021  [facility] and [place] Number:  Engineering geologist and Address:  [facility], [address] [place], [place] [address]      Provider Number: [id number]  Attending Physician Name and Address:  Enzo Bi, MD  Relative Name and Phone Number:  [name] (daughter and POA) [phone]    Current Level of Care: Hospital Recommended Level of Care: [place] Prior Approval Number:    Date Approved/Denied:   PASRR Number: [id number]  Discharge Plan: SNF    Current Diagnoses: Patient Active Problem List   Diagnosis Date Noted   Elevated troponin 09/10/2021   CHF (congestive heart failure), NYHA class IV, acute, diastolic ([place]) 83/66/2947   Thrombocytopenia ([place]) 65/46/5035   Acute systolic CHF (congestive heart failure) ([place]) 08/23/2021   Hypertensive emergency 08/23/2021   NSTEMI (non-ST elevated myocardial infarction) ([place]) 08/18/2021   History of nonmelanoma skin cancer 06/06/2020   Senile purpura ([place]) 05/04/2020   Nocturnal hypoxemia due to emphysema ([place]) 05/04/2020   Carotid atherosclerosis, bilateral 08/27/2018   Bilateral carotid bruits 08/27/2018   CKD (chronic kidney disease) stage 3, GFR 30-59 ml/min (HCC) 08/27/2018   Age-related osteoporosis without current pathological fracture 08/11/2018   Shoulder pain, left 08/29/2017   Acid reflux 46/56/8127   Uncomplicated opioid use 51/70/0174   Hyperglycemia 09/02/2016   Left thyroid nodule 07/13/2016   Abnormal bone scan of cervical spine 06/18/2016   Primary osteoarthritis of both hands 05/03/2016   Elevated serum alkaline phosphatase level 05/03/2016   Hyperkalemia 05/03/2016   Degenerative disc disease, thoracic 05/03/2016   Hx of smoking 04/17/2016   Controlled substance agreement signed  03/22/2016   Back pain 03/22/2016   Hyperlipidemia 10/25/2015   Essential hypertension 10/25/2015   Panlobular emphysema ([place]) 10/25/2015   Chronic pain 05/25/2015   Actinic keratosis 05/25/2015    Orientation RESPIRATION BLADDER Height & Weight     Self, Time, Situation, Place  O2 (3L nasal cannula) Incontinent, External catheter Weight: 100 lb 9.6 oz (45.6 kg) Height:  5' (152.4 cm)  BEHAVIORAL SYMPTOMS/MOOD NEUROLOGICAL BOWEL NUTRITION STATUS      Continent Diet (see discharge summary)  AMBULATORY STATUS COMMUNICATION OF NEEDS Skin   Extensive Assist Verbally Normal                       Personal Care Assistance Level of Assistance  Bathing, Feeding, Dressing, Total care Bathing Assistance: Limited assistance Feeding assistance: Limited assistance (needs set up) Dressing Assistance: Limited assistance Total Care Assistance: Maximum assistance   Functional Limitations Info  Sight, Hearing, Speech Sight Info: Adequate Hearing Info: Impaired Speech Info: Adequate    SPECIAL CARE FACTORS FREQUENCY  PT (By licensed PT), OT (By licensed OT)     PT Frequency: min 4x weekly OT Frequency: min 4x weekly            Contractures Contractures Info: Not present    Additional Factors Info  Code Status, Allergies Code Status Info: DNR Allergies Info: Alendronate           Current Medications (09/14/2021):  This is the current hospital active medication list Current Facility-Administered Medications  Medication Dose Route Frequency Provider Last Rate Last Admin   0.9 %  sodium chloride infusion  250 mL Intravenous PRN Dionisio David, MD  0.9 %  sodium chloride infusion  250 mL Intravenous PRN Wellington Hampshire, MD       acetaminophen (TYLENOL) tablet 650 mg  650 mg Oral Q4H PRN Neoma Laming A, MD   650 mg at 09/12/21 1133   albuterol (PROVENTIL) (2.5 MG/3ML) 0.083% nebulizer solution 3 mL  3 mL Inhalation Q6H PRN Wellington Hampshire, MD   3 mL at 09/13/21 0516    aspirin chewable tablet 81 mg  81 mg Oral Daily Wellington Hampshire, MD   81 mg at 09/14/21 0859   heparin injection 5,000 Units  5,000 Units Subcutaneous Q8H Elodia Florence., MD   5,000 Units at 09/13/21 2136   HYDROcodone-acetaminophen (NORCO/VICODIN) 5-325 MG per tablet 1 tablet  1 tablet Oral TID PRN Wellington Hampshire, MD   1 tablet at 09/14/21 0900   metoprolol tartrate (LOPRESSOR) tablet 12.5 mg  12.5 mg Oral BID Elodia Florence., MD   12.5 mg at 09/14/21 0859   nitroGLYCERIN (NITROSTAT) SL tablet 0.4 mg  0.4 mg Sublingual Q5 Min x 3 PRN Wellington Hampshire, MD       ondansetron (ZOFRAN) tablet 4 mg  4 mg Oral Q6H PRN Wellington Hampshire, MD       Or   ondansetron (ZOFRAN) injection 4 mg  4 mg Intravenous Q6H PRN Wellington Hampshire, MD       ondansetron (ZOFRAN) injection 4 mg  4 mg Intravenous Q6H PRN Neoma Laming A, MD       pantoprazole (PROTONIX) EC tablet 40 mg  40 mg Oral Daily Kathlyn Sacramento A, MD   40 mg at 09/14/21 0859   rosuvastatin (CRESTOR) tablet 10 mg  10 mg Oral Daily Kathlyn Sacramento A, MD   10 mg at 09/14/21 0859   sodium chloride flush (NS) 0.9 % injection 3 mL  3 mL Intravenous Q12H Kathlyn Sacramento A, MD   3 mL at 09/13/21 2137   sodium chloride flush (NS) 0.9 % injection 3 mL  3 mL Intravenous Q12H Neoma Laming A, MD   3 mL at 09/14/21 0903   sodium chloride flush (NS) 0.9 % injection 3 mL  3 mL Intravenous PRN Neoma Laming A, MD       sodium chloride flush (NS) 0.9 % injection 3 mL  3 mL Intravenous Q12H Kathlyn Sacramento A, MD   3 mL at 09/14/21 0903   sodium chloride flush (NS) 0.9 % injection 3 mL  3 mL Intravenous PRN Wellington Hampshire, MD       ticagrelor (BRILINTA) tablet 90 mg  90 mg Oral BID Kathlyn Sacramento A, MD   90 mg at 09/14/21 0859   umeclidinium-vilanterol (ANORO ELLIPTA) 62.5-25 MCG/ACT 1 puff  1 puff Inhalation Q0600 Wellington Hampshire, MD   1 puff at 09/13/21 1224     Discharge Medications: Please see discharge summary for a list of  discharge medications.  Relevant Imaging Results:  Relevant Lab Results:   Additional Information MGN:[id number]  Alberteen Sam, LCSW

## 2021-09-14 NOTE — TOC Initial Note (Signed)
Transition of Care [facility]) - Initial/Assessment Note    Patient Details  Name: Samantha Dawson MRN: [id number] Date of Birth: 1929/02/16  Transition of Care [facility]) CM/SW Contact:    Alberteen Sam, LCSW Phone Number: 09/14/2021, 12:14 PM  Clinical Narrative:                  CSW Spoke to patient's daughter Benjamine Mola who reports patient's other daughter [name] is POA and Media planner. CSW called patient's youngest daughter Candise Bowens at [phone].   [name] confirms being POA, reports she is in agreement of PT change in recommendations from [facility] to now SNF. NO preference of facility however does report she does not want [facility].   CSW has sent out referrals and will provide bed offers to [place] once available.  Expected Discharge Plan: Skilled Nursing Facility Barriers to Discharge: Continued Medical Work up   Patient Goals and CMS Choice Patient states their goals for this hospitalization and ongoing recovery are:: to go home CMS Medicare.gov Compare Post Acute Care list provided to:: Patient Represenative (must comment) [facility]) Choice offered to / list presented to : [facility] POA / Guardian  Expected Discharge Plan and Services Expected Discharge Plan: [place] Acute Care Choice: [place] Living arrangements for the past 2 months: Single Family Home                                      Prior Living Arrangements/Services Living arrangements for the past 2 months: Single Family Home Lives with:: Self Patient language and need for interpreter reviewed:: Yes        Need for Family Participation in Patient Care: Yes (Comment) Care giver support system in place?: Yes (comment)   Criminal Activity/Legal Involvement Pertinent to Current Situation/Hospitalization: No - Comment as needed  Activities of Daily Living Home Assistive Devices/Equipment: None ADL Screening (condition at time of admission) Patient's cognitive  ability adequate to safely complete daily activities?: Yes Is the patient deaf or have difficulty hearing?: No Does the patient have difficulty seeing, even when wearing glasses/contacts?: No Does the patient have difficulty concentrating, remembering, or making decisions?: No Patient able to express need for assistance with ADLs?: Yes Does the patient have difficulty dressing or bathing?: No Independently performs ADLs?: Yes (appropriate for developmental age) Does the patient have difficulty walking or climbing stairs?: Yes Weakness of Legs: Both Weakness of Arms/Hands: None  Permission Sought/Granted      Share Information with NAME: [name]  Permission granted to share info w AGENCY: SNFs  Permission granted to share info w Relationship: daughter  Permission granted to share info w Contact Information: [phone]  Emotional Assessment         Alcohol / Substance Use: Not Applicable Psych Involvement: No (comment)  Admission diagnosis:  Shortness of breath [R06.02] Elevated troponin [R77.8] NSTEMI (non-ST elevated myocardial infarction) ([place]) [I21.4] AKI (acute kidney injury) ([place]) [N17.9] Congestive heart failure, unspecified HF chronicity, unspecified heart failure type ([place]) [I50.9] Patient Active Problem List   Diagnosis Date Noted   Elevated troponin 09/10/2021   CHF (congestive heart failure), NYHA class IV, acute, diastolic ([place]) 81/27/5170   Thrombocytopenia ([place]) 01/74/9449   Acute systolic CHF (congestive heart failure) ([place]) 08/23/2021   Hypertensive emergency 08/23/2021   NSTEMI (non-ST elevated myocardial infarction) ([place]) 08/18/2021   History of nonmelanoma skin cancer 06/06/2020  Senile purpura ([place]) 05/04/2020   Nocturnal hypoxemia due to emphysema ([place]) 05/04/2020   Carotid atherosclerosis, bilateral 08/27/2018   Bilateral carotid bruits 08/27/2018   CKD (chronic kidney disease) stage 3, GFR 30-59 ml/min (HCC) 08/27/2018   Age-related osteoporosis  without current pathological fracture 08/11/2018   Shoulder pain, left 08/29/2017   Acid reflux 04/88/8916   Uncomplicated opioid use 94/50/3888   Hyperglycemia 09/02/2016   Left thyroid nodule 07/13/2016   Abnormal bone scan of cervical spine 06/18/2016   Primary osteoarthritis of both hands 05/03/2016   Elevated serum alkaline phosphatase level 05/03/2016   Hyperkalemia 05/03/2016   Degenerative disc disease, thoracic 05/03/2016   Hx of smoking 04/17/2016   Controlled substance agreement signed 03/22/2016   Back pain 03/22/2016   Hyperlipidemia 10/25/2015   Essential hypertension 10/25/2015   Panlobular emphysema ([place]) 10/25/2015   Chronic pain 05/25/2015   Actinic keratosis 05/25/2015   PCP:  Steele Sizer, MD Pharmacy:   [id number] [address], [address] [place] [address] Phone: [phone] Fax: [phone]     Social Determinants of Health (SDOH) Interventions    Readmission Risk Interventions No flowsheet data found.

## 2021-09-14 NOTE — Progress Notes (Signed)
SUBJECTIVE: Status post PCI with drug-eluting stent of the mid LAD and non-STEMI. Patient denies chest pain this morning. Continues to have shortness of breath, worse with exertion.    Vitals:   09/13/21 1936 09/13/21 2317 09/14/21 0419 09/14/21 0800  BP: (!) 86/66 (!) 107/56 (!) 103/58 107/66  Pulse: 93 89 87 95  Resp: 18 18 18 18   Temp: 98.5 F (36.9 C) 98.7 F (37.1 C) 97.9 F (36.6 C) 97.8 F (36.6 C)  TempSrc:  Oral Oral Oral  SpO2: 93% 94% 94% 100%  Weight:      Height:        Intake/Output Summary (Last 24 hours) at 09/14/2021 0905 Last data filed at 09/14/2021 0802 Gross per 24 hour  Intake 600 ml  Output 600 ml  Net 0 ml    LABS: Basic Metabolic Panel: Recent Labs    09/13/21 0555 09/14/21 0510  NA 141 141  K 4.5 3.8  CL 106 105  CO2 24 26  GLUCOSE 121* 112*  BUN 24* 25*  CREATININE 1.32* 1.28*  CALCIUM 8.9 8.9  MG 2.3 2.5*  PHOS 2.6 2.9   Liver Function Tests: Recent Labs    09/13/21 0555 09/14/21 0510  AST 66* 44*  ALT 21 19  ALKPHOS 123 119  BILITOT 1.9* 1.6*  PROT 7.0 7.0  ALBUMIN 3.3* 3.3*   No results for input(s): LIPASE, AMYLASE in the last 72 hours. CBC: Recent Labs    09/13/21 0555 09/14/21 0510  WBC 9.0 11.8*  NEUTROABS 6.4 8.9*  HGB 12.0 11.2*  HCT 36.6 34.2*  MCV 99.5 100.0  PLT 205 192   Cardiac Enzymes: No results for input(s): CKTOTAL, CKMB, CKMBINDEX, TROPONINI in the last 72 hours. BNP: Invalid input(s): POCBNP D-Dimer: No results for input(s): DDIMER in the last 72 hours. Hemoglobin A1C: No results for input(s): HGBA1C in the last 72 hours. Fasting Lipid Panel: No results for input(s): CHOL, HDL, LDLCALC, TRIG, CHOLHDL, LDLDIRECT in the last 72 hours. Thyroid Function Tests: No results for input(s): TSH, T4TOTAL, T3FREE, THYROIDAB in the last 72 hours.  Invalid input(s): FREET3 Anemia Panel: No results for input(s): VITAMINB12, FOLATE, FERRITIN, TIBC, IRON, RETICCTPCT in the last 72 hours.   PHYSICAL  EXAM General: Well developed, well nourished, in no acute distress HEENT:  Normocephalic and atramatic Neck:  No JVD.  Lungs: Clear bilaterally to auscultation and percussion. Heart: HRRR . Normal S1 and S2 without gallops or murmurs.  Abdomen: Bowel sounds are positive, abdomen soft and non-tender  Msk:  Back normal, normal gait. Normal strength and tone for age. Extremities: No clubbing, cyanosis or edema.   Neuro: Alert and oriented X 3. Psych:  Good affect, responds appropriately  TELEMETRY: NSR, HR 77 bpm  ASSESSMENT AND PLAN: Patient stable cardiac wise. Continued shortness of breath. CT chest 09/13/21 revealed small bilateral pleural effusions, left greater than right, partial compressive atelectasis of lower lobes vs. Pneumonia. Will continue to observe.   Principal Problem:   NSTEMI (non-ST elevated myocardial infarction) ([place]) Active Problems:   Actinic keratosis   Hyperlipidemia   Essential hypertension   Panlobular emphysema (HCC)   Hx of smoking   Primary osteoarthritis of both hands   Acid reflux   Senile purpura (HCC)   Nocturnal hypoxemia due to emphysema (HCC)   Acute systolic CHF (congestive heart failure) (HCC)   Elevated troponin    Samantha Pareja, FNP-C 09/14/2021 9:05 AM

## 2021-09-15 DIAGNOSIS — I214 Non-ST elevation (NSTEMI) myocardial infarction: Secondary | ICD-10-CM | POA: Diagnosis not present

## 2021-09-15 DIAGNOSIS — R918 Other nonspecific abnormal finding of lung field: Secondary | ICD-10-CM | POA: Diagnosis not present

## 2021-09-15 LAB — BASIC METABOLIC PANEL
Anion gap: 7 (ref 5–15)
BUN: 25 mg/dL — ABNORMAL HIGH (ref 8–23)
CO2: 28 mmol/L (ref 22–32)
Calcium: 8.5 mg/dL — ABNORMAL LOW (ref 8.9–10.3)
Chloride: 103 mmol/L (ref 98–111)
Creatinine, Ser: 1.33 mg/dL — ABNORMAL HIGH (ref 0.44–1.00)
GFR, Estimated: 38 mL/min — ABNORMAL LOW (ref >60)
Glucose, Bld: 109 mg/dL — ABNORMAL HIGH (ref 70–99)
Potassium: 3.4 mmol/L — ABNORMAL LOW (ref 3.5–5.1)
Sodium: 138 mmol/L (ref 135–145)

## 2021-09-15 LAB — MAGNESIUM: Magnesium: 2.2 mg/dL (ref 1.7–2.4)

## 2021-09-15 MED ORDER — FUROSEMIDE 10 MG/ML IJ SOLN
40.0000 mg | Freq: Once | INTRAMUSCULAR | Status: AC
  Administered 2021-09-15: 40 mg via INTRAVENOUS
  Filled 2021-09-15: qty 4

## 2021-09-15 MED ORDER — POTASSIUM CHLORIDE CRYS ER 20 MEQ PO TBCR
40.0000 meq | EXTENDED_RELEASE_TABLET | Freq: Once | ORAL | Status: AC
  Administered 2021-09-15: 40 meq via ORAL
  Filled 2021-09-15: qty 2

## 2021-09-15 NOTE — Consult Note (Signed)
   Heart Failure Nurse Navigator Note  Presented to the emergency room with complaints of shortness of breath.  HFrEF 35-40%.  Asymmetrical severe left ventricular hypertrophy of the basal septal segment.  Grade 2 diastolic dysfunction normal right systolic function.  Moderate biatrial enlargement.  Mild mitral stenosis.  Mild to moderate aortic valve sclerosis without evidence of stenosis.      Comorbidities:  Coronary artery disease Hyperlipidemia Hypertension Panlobular emphysema Osteoarthritis  Labs:  BNP on 11/ 9 1716, labs on 1110-sodium 138, potassium 3.4, chloride 103, CO2 28, BUN 25, creatinine 1.33, magnesium 2.2. Intake 480 mL Output 550 mL Weight 42.1 kg   Medications:  Aspirin 81 mg Metoprolol tartrate 12 and half milligrams 2 times a day Nitrostat 0.4 mg sublingual as needed as needed  Crestor 10 mg daily Ticagrelor 90 mg 2 times a day   Initial meeting with patient today.  She had just ambulated 132 feet out into the hall with physical therapy.  She appeared to tolerate well and did not complain of any increasing shortness of breath.   She is very hard of hearing and also states that with her vision she can no longer read printed materials.   Discussed heart failure, she states that she had heard the term in the past.  If she continues doing well she may be discharged home.  She tells me that she lives by herself ,she prepares her own meals. She states that she does not eat a lot but sure she will fix her self and egg or instant mashed potatoes or have a bowl of soup.  Discussed using low sodium soups, she states that is what she has been doing.  Discussed the importance of daily weight first thing in the morning after going to the bathroom and to report a 2 pound weight gain overnight or 5 pounds within the week.  Told her that I would supply her with a scale.  Discussed follow-up in the outpatient heart failure clinic and she thought if that would  help keep her out of the hospital she would be willing to give it a try.  She was given an appointment for December 7.  She has an appointment with Dr. Chancy Milroy on November 17 at 1:15 in the afternoon.  There were no family members at the bedside, I did give her the printed materials of the living with heart failure, low-sodium information and the zone magnet along with information about the outpatient heart failure clinic for her family to read.   Pricilla Riffle RN CHFN

## 2021-09-15 NOTE — TOC Progression Note (Signed)
Transition of Care [facility]) - Progression Note    Patient Details  Name: DEBRIA BROECKER MRN: [id number] Date of Birth: 1929-02-03  Transition of Care [facility]) CM/SW Contact  Eileen Stanford, LCSW Phone Number: 09/15/2021, 2:51 PM  Clinical Narrative:   Bed offer provided to pt's daughter [name]. She would like to see if the three facilities pending respond. CSW has resent referral and will follow up.    Expected Discharge Plan: [place] Barriers to Discharge: Continued Medical Work up  Expected Discharge Plan and Services Expected Discharge Plan: [place] Choice: [place] arrangements for the past 2 months: Single Family Home                                       Social Determinants of Health (SDOH) Interventions    Readmission Risk Interventions No flowsheet data found.

## 2021-09-15 NOTE — Progress Notes (Signed)
Physical Therapy Treatment Patient Details Name: Samantha Dawson MRN: [id number] DOB: [birth year] Today's Date: 09/15/2021   History of Present Illness Pt is an 87 y/o F admitted on 09/09/21 with c/c of SOB. Pt is being treated for suspected combined heart failure exacerbation. Of note, pt was recently admitted & declined cardiac catheterization. Cardiology was consulted for this admission 2/2 NTSEMI & chest pain & at this time pt is agreeable to cardiac catheterization. PMH: CAD, HTN, heart failure reduced EF, OA, pan lobar emphysema, DDD, tobacco use, GERD, nonmelanoma skin CA    PT Comments    Pt received upright in bed agreeable to PT. Remains on 3L with SPO2 at 98% and HR 81 BPM. Denies SOB. Only requires supervision for bed mobility and transferring to standing with RW. Tolerated two bouts of amb of 74' with seated rest (3 min rest break) between. Pt displays mild SOB after each ambulatory bout but SPO2 remains > 92% throughout with max HR of 104 BPM. Pt endorses at home she would need to walk similar distances and then be able to stand for other ADL's and reports she still requires a seated rest break. Also endorses living alone and not being 100% confidence in her current endurance. Pt remained sitting EOB as nursing for heart failure wishing to talk with patient. Pt progressing in her mobility but will benefit from STR due to endurance deficits with standing and walking ADL's prior to transitioning back to home environment.   Recommendations for follow up therapy are one component of a multi-disciplinary discharge planning process, led by the attending physician.  Recommendations may be updated based on patient status, additional functional criteria and insurance authorization.  Follow Up Recommendations  Skilled nursing-short term rehab (<3 hours/day)     Assistance Recommended at Discharge Intermittent Supervision/Assistance  Equipment Recommendations  None recommended by PT     Recommendations for Other Services       Precautions / Restrictions Precautions Precautions: Fall Restrictions Weight Bearing Restrictions: No     Mobility  Bed Mobility Overal bed mobility: Needs Assistance Bed Mobility: Supine to Sit     Supine to sit: Supervision;HOB elevated Sit to supine: Supervision     Patient Response: Cooperative  Transfers Overall transfer level: Needs assistance Equipment used: Rolling walker (2 wheels) Transfers: Sit to/from Stand Sit to Stand: Supervision                Ambulation/Gait Ambulation/Gait assistance: Min guard Gait Distance (Feet): 132 Feet (2x66') Assistive device: Rolling walker (2 wheels) Gait Pattern/deviations: WFL(Within Functional Limits)       General Gait Details: maintained SPO2 sats > 90% on 3 L with amb. performed two bouts of 66' required seated rest between bouts.   Stairs             Wheelchair Mobility    Modified Rankin (Stroke Patients Only)       Balance Overall balance assessment: Needs assistance Sitting-balance support: Bilateral upper extremity supported;Feet supported Sitting balance-Leahy Scale: Good     Standing balance support: Bilateral upper extremity supported;During functional activity Standing balance-Leahy Scale: Fair Standing balance comment: BUE support on RW with supervision                            Cognition Arousal/Alertness: Awake/alert Behavior During Therapy: WFL for tasks assessed/performed Overall Cognitive Status: Within Functional Limits for tasks assessed  Exercises      General Comments        Pertinent Vitals/Pain Pain Assessment: No/denies pain    Home Living Family/patient expects to be discharged to:: Private residence Living Arrangements: Alone Available Help at Discharge: Family;Available PRN/intermittently Type of Home: House Home Access: Stairs to  enter Entrance Stairs-Rails: Right;Left;Can reach both Entrance Stairs-Number of Steps: 1   Home Layout: One level Home Equipment: Conservation officer, nature (2 wheels)      Prior Function            PT Goals (current goals can now be found in the care plan section) Acute Rehab PT Goals Patient Stated Goal: get better PT Goal Formulation: With patient Time For Goal Achievement: 09/24/21 Potential to Achieve Goals: Good    Frequency    Min 2X/week      PT Plan      Co-evaluation              AM-PAC PT "6 Clicks" Mobility   Outcome Measure  Help needed turning from your back to your side while in a flat bed without using bedrails?: None Help needed moving from lying on your back to sitting on the side of a flat bed without using bedrails?: A Little Help needed moving to and from a bed to a chair (including a wheelchair)?: A Little Help needed standing up from a chair using your arms (e.g., wheelchair or bedside chair)?: A Little Help needed to walk in hospital room?: A Little Help needed climbing 3-5 steps with a railing? : A Lot 6 Click Score: 18    End of Session Equipment Utilized During Treatment: Gait belt;Oxygen Activity Tolerance: Patient tolerated treatment well Patient left: in bed;with call bell/phone within reach;with bed alarm set;with nursing/sitter in room Nurse Communication: Mobility status PT Visit Diagnosis: Unsteadiness on feet (R26.81);Muscle weakness (generalized) (M62.81);Difficulty in walking, not elsewhere classified (R26.2)     Time: 8413-2440 PT Time Calculation (min) (ACUTE ONLY): 23 min  Charges:  $Therapeutic Exercise: 23-37 mins                     Rondall Radigan M. Fairly IV, PT, DPT Physical Therapist- [facility]  09/15/2021, 3:29 PM

## 2021-09-15 NOTE — Progress Notes (Signed)
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  Now s/p catheterization.   # Acute on chronic Hypoxic Respiratory Failure 2/2  Acute on chronic combined CHF exacerbation --88% on 2L, needed up to 5L O2 --received intermittent IV lasix when BP allowed -- CXR concerning for multilobar bilateral bronchopneumonia, however, pt doesn't have any fever or infectious symptoms, procal remained neg, suspect this represents HF. Plan: --repeat IV lasix 40 mg x1 today --Continue supplemental O2 to keep sats between 88-92%, wean as tolerated --Hold abx   # NSTEMI s/p sent CAD -- troponin peaked at 7376  -- LHC with severe 2 vessel CAD with chronic occlusion of proxima/mid L circumflex with left to left collaterals and severe 99% stenosis in proximal LAD which is likely culprit for NSTEMI -> s/p angioplastic and DES to proximal LAD (25% residual stenosis)  Plan: --cont ASA and Brilinta, need at least 12 months --cont ASA, statin   # Acute kidney injury --in setting of HF, diuresis, and contrast --renal US - negative for hydro --monitor Cr while diuresing   # Hyperlipidemia --cont statin   # COPD on chronic 2L at baseline --cont home bronchodilator   #  Hypokalemia --monitor and replete PRN  # 1.5 cm right upper lobe nodule with irregular or spiculated margin --incidental finding on CT chest --oncology consulted --can't hold DAPT now for biopsy given recent stent Plan: --follow up with oncology outpatient --plan for outpatient PET scan   DVT prophylaxis: Heparin SQ Code Status: DNR  Family Communication:   Level of care: Progressive Cardiac Dispo:   The patient is from: home Anticipated d/c is to: SNF Anticipated d/c date is: whenever bed available  Patient currently is medically ready to d/c.   Subjective and Interval History:  Pt reported breathing ok.  Not eating much.   Objective: Vitals:   09/15/21 0821 09/15/21 1150 09/15/21 1446 09/15/21 1447  BP: (!) 114/50 (!) 151/132 96/61   Pulse: 79 72 91 96  Resp: 16 18 17    Temp: 98.3 F (36.8 C) 97.7 F (36.5 C) 97.6 F (36.4 C)   TempSrc:      SpO2: 100% 100%  100%  Weight:      Height:        Intake/Output Summary (Last 24 hours) at 09/15/2021 1622 Last data filed at 09/15/2021 0950 Gross per 24 hour  Intake 240 ml  Output 150 ml  Net 90 ml   Filed Weights   09/09/21 1400 09/11/21 1342 09/15/21 0400  Weight: 46.3 kg 45.6 kg 42.1 kg    Examination:   Constitutional: NAD, AAOx3, frail  HEENT: conjunctivae and lids normal, EOMI CV: No cyanosis.   RESP: normal respiratory effort, on 2L Extremities: No effusions, edema in BLE SKIN: warm, dry  Neuro: II - XII grossly intact.     Data Reviewed: I have personally reviewed following labs and imaging studies  CBC: Recent Labs  Lab 09/09/21 1402 09/10/21 0647 09/11/21 0532 09/12/21 0457 09/13/21 0555 09/14/21 0510  WBC 7.9 7.7 9.4 8.4 9.0 11.8*  NEUTROABS 5.9  --  6.1 5.3 6.4 8.9*  HGB 12.1 12.1 13.3 12.0 12.0 11.2*  HCT 36.7 35.8* 38.7 35.9* 36.6 34.2*  MCV 98.7 98.1 97.0 97.8 99.5 100.0  PLT 246 234 272 243 205 841   Basic Metabolic Panel: Recent Labs  Lab 09/09/21 1956 09/10/21 0647  09/11/21 0532 09/12/21 0457 09/13/21 0555 09/14/21 0510 09/15/21 0526  NA  --    < > 140 140 141 141 138  K  --    < > 3.5 3.0* 4.5 3.8 3.4*  CL  --    < > 102 99 106 105 103  CO2  --    < > 27 32 24 26 28   GLUCOSE  --    < > 152* 117* 121* 112* 109*  BUN  --    < > 25* 27* 24* 25* 25*  CREATININE  --    < > 1.33* 1.42* 1.32* 1.28* 1.33*  CALCIUM  --    < > 9.2 9.1 8.9 8.9 8.5*  MG 2.2  --  2.2 2.2 2.3 2.5* 2.2  PHOS 2.0*  --  2.3* 3.4 2.6 2.9  --    < > = values in this interval not displayed.   GFR: Estimated Creatinine Clearance: 19.1 mL/min (A) (by C-G formula based on SCr of 1.33 mg/dL (H)). Liver Function Tests: Recent Labs  Lab 09/09/21 1402 09/11/21 0532 09/12/21 0457 09/13/21 0555 09/14/21 0510  AST 34 36 67* 66* 44*  ALT 22 22 22 21 19   ALKPHOS 145* 143* 129* 123 119  BILITOT 1.5* 1.9* 1.7* 1.9* 1.6*  PROT 7.2 7.7 7.1 7.0 7.0  ALBUMIN 3.5 3.6 3.4* 3.3* 3.3*   No results for input(s): LIPASE, AMYLASE in the last 168 hours. No results for input(s): AMMONIA in the last 168 hours. Coagulation Profile: Recent Labs  Lab 09/09/21 1956  INR 1.1   Cardiac Enzymes: No results for input(s): CKTOTAL, CKMB, CKMBINDEX, TROPONINI in the last 168 hours. BNP (last 3 results) No results for input(s): PROBNP in the last 8760 hours. HbA1C: No results for input(s): HGBA1C in the last 72 hours. CBG: Recent Labs  Lab 09/12/21 2059  GLUCAP 117*   Lipid Profile: No results for input(s): CHOL, HDL, LDLCALC, TRIG, CHOLHDL, LDLDIRECT in the last 72 hours. Thyroid Function Tests: No results for input(s): TSH, T4TOTAL, FREET4, T3FREE, THYROIDAB in the last 72 hours. Anemia Panel: No results for input(s): VITAMINB12, FOLATE, FERRITIN, TIBC, IRON, RETICCTPCT in the last 72 hours. Sepsis Labs: Recent Labs  Lab 09/09/21 1956 09/14/21 0510  PROCALCITON <0.10 0.11    Recent Results (from the past 240 hour(s))  Resp Panel by RT-PCR (Flu A&B, Covid) Nasopharyngeal Swab      Status: None   Collection Time: 09/09/21  6:51 PM   Specimen: Nasopharyngeal Swab; Nasopharyngeal(NP) swabs in vial transport medium  Result Value Ref Range Status   SARS Coronavirus 2 by RT PCR NEGATIVE NEGATIVE Final    Comment: (NOTE) SARS-CoV-2 target nucleic acids are NOT DETECTED.  The SARS-CoV-2 RNA is generally detectable in upper respiratory specimens during the acute phase of infection. The lowest concentration of SARS-CoV-2 viral copies this assay can detect is 138 copies/mL. A negative result  does not preclude SARS-Cov-2 infection and should not be used as the sole basis for treatment or other patient management decisions. A negative result may occur with  improper specimen collection/handling, submission of specimen other than nasopharyngeal swab, presence of viral mutation(s) within the areas targeted by this assay, and inadequate number of viral copies(<138 copies/mL). A negative result must be combined with clinical observations, patient history, and epidemiological information. The expected result is Negative.  Fact Sheet for Patients:  EntrepreneurPulse.com.au  Fact Sheet for Healthcare Providers:  IncredibleEmployment.be  This test is no t yet approved or cleared by the [place] FDA and  has been authorized for detection and/or diagnosis of SARS-CoV-2 by FDA under an Emergency Use Authorization (EUA). This EUA will remain  in effect (meaning this test can be used) for the duration of the COVID-19 declaration under Section 564(b)(1) of the Act, 21 U.S.C.section 360bbb-3(b)(1), unless the authorization is terminated  or revoked sooner.       Influenza A by PCR NEGATIVE NEGATIVE Final   Influenza B by PCR NEGATIVE NEGATIVE Final    Comment: (NOTE) The Xpert Xpress SARS-CoV-2/FLU/RSV plus assay is intended as an aid in the diagnosis of influenza from Nasopharyngeal swab specimens and should not be used as a sole basis  for treatment. Nasal washings and aspirates are unacceptable for Xpert Xpress SARS-CoV-2/FLU/RSV testing.  Fact Sheet for Patients: EntrepreneurPulse.com.au  Fact Sheet for Healthcare Providers: IncredibleEmployment.be  This test is not yet approved or cleared by the [place] FDA and has been authorized for detection and/or diagnosis of SARS-CoV-2 by FDA under an Emergency Use Authorization (EUA). This EUA will remain in effect (meaning this test can be used) for the duration of the COVID-19 declaration under Section 564(b)(1) of the Act, 21 U.S.C. section 360bbb-3(b)(1), unless the authorization is terminated or revoked.  Performed at [facility], [address] [place], [place] [address]       Radiology Studies: CT CHEST WO CONTRAST  Result Date: 09/13/2021 CLINICAL DATA:  Chest pain and shortness of breath. EXAM: CT CHEST WITHOUT CONTRAST TECHNIQUE: Multidetector CT imaging of the chest was performed following the standard protocol without IV contrast. COMPARISON:  Chest radiograph dated 09/13/2021. FINDINGS: Evaluation of this exam is limited in the absence of intravenous contrast. Cardiovascular: Were top-normal cardiac size. No pericardial effusion. Advanced 3 vessel coronary vascular calcification and calcification of the mitral annulus. Four there is advanced atherosclerotic calcification of the thoracic aorta. The central pulmonary arteries are grossly unremarkable. Mediastinum/Nodes: No hilar or mediastinal adenopathy. The esophagus is grossly unremarkable. A 1.3 cm left thyroid hypodense lesion. In the setting of significant comorbidities or limited life expectancy, no follow-up recommended (ref: J Am Coll Radiol. 2015 Feb;12(2): 143-50).No mediastinal fluid collection. No focal or Lungs/Pleura: Small bilateral pleural effusions, left greater right. There is partial compressive atelectasis of the lower lobes versus pneumonia.  Background of severe emphysema. Forty pole pole there is no pneumothorax. The central airways are patent. There is a 1.5 cm nodule in the right upper lobe with irregular or spiculated margin. Multidisciplinary consult and further evaluation with PET-CT if clinically indicated, recommended. Upper Abdomen: Partially visualized 2.5 cm left renal cyst. Musculoskeletal: Osteopenia with degenerative changes of the spine. No acute osseous pathology. IMPRESSION: 1. Small bilateral pleural effusions, left greater right with partial compressive atelectasis of the lower lobes versus pneumonia. 2. A 1.5 cm right upper lobe nodule with irregular or spiculated margin. This is concerning for malignancy. Multidisciplinary consult and further evaluation with  PET-CT, if clinically indicated, recommended. 3. Aortic Atherosclerosis (ICD10-I70.0) and Emphysema (ICD10-J43.9). Electronically Signed   By: Anner Crete M.D.   On: 09/13/2021 22:58     Scheduled Meds:  aspirin  81 mg Oral Daily   heparin injection (subcutaneous)  5,000 Units Subcutaneous Q8H   metoprolol tartrate  12.5 mg Oral BID   pantoprazole  40 mg Oral Daily   rosuvastatin  10 mg Oral Daily   sodium chloride flush  3 mL Intravenous Q12H   sodium chloride flush  3 mL Intravenous Q12H   sodium chloride flush  3 mL Intravenous Q12H   ticagrelor  90 mg Oral BID   umeclidinium-vilanterol  1 puff Inhalation Q0600   Continuous Infusions:  sodium chloride     sodium chloride       LOS: 5 days     Enzo Bi, MD [facility] If 7PM-7AM, please contact night-coverage 09/15/2021, 4:22 PM

## 2021-09-15 NOTE — Consult Note (Signed)
Hematology/Oncology Consult Note [facility]  Telephone:([phone] [phone]    Patient Care Team: Steele Sizer, MD as PCP - General (Family Medicine) Dionisio David, MD as Consulting Physician (Cardiology)   Name of the patient: Raychel Dowler  [id number]  October 26, 1929   Date of visit: 09/15/2021  History of Presenting Illness- Patient is 87 year old female with past medical history of CHF, class IV, NSTEMI, emphysema, CKD stage 3, and osteoarthritis, currently admitted to hospital for NSTEMI s/p stent. During work up for shortness of breath and hypoxia, CT scan was obtained which showed 1.5 cm right upper lobe nodule with spiculated margins concerning for malignancy. She has a history of smoking but quit 'years ago'. Oncology consulted to evaluation and management.   She is unsure when she quit smoking but smoked for 'long time'. She has no current shortness of breath, cough, chest pain, or unintentional weight loss. Denies fevers, chills.   ECOG PS- 3  Review of Systems  Constitutional:  Negative for chills, fever, malaise/fatigue and weight loss.  HENT:  Positive for hearing loss. Negative for nosebleeds, sore throat and tinnitus.   Eyes:  Negative for blurred vision and double vision.  Respiratory:  Negative for cough, hemoptysis, shortness of breath and wheezing.   Cardiovascular:  Negative for chest pain, palpitations and leg swelling.  Gastrointestinal:  Negative for abdominal pain, blood in stool, constipation, diarrhea, melena, nausea and vomiting.  Genitourinary:  Negative for dysuria and urgency.  Musculoskeletal:  Negative for back pain, falls, joint pain and myalgias.  Skin:  Negative for itching and rash.  Neurological:  Negative for dizziness, tingling, sensory change, loss of consciousness, weakness and headaches.  Endo/Heme/Allergies:  Negative for environmental allergies. Does not bruise/bleed easily.  Psychiatric/Behavioral:   Negative for depression. The patient is not nervous/anxious and does not have insomnia.    Allergies  Allergen Reactions   Alendronate Nausea Only    Patient Active Problem List   Diagnosis Date Noted   Elevated troponin 09/10/2021   CHF (congestive heart failure), NYHA class IV, acute, diastolic ([place]) 17/61/6073   Thrombocytopenia ([place]) 71/04/2693   Acute systolic CHF (congestive heart failure) ([place]) 08/23/2021   Hypertensive emergency 08/23/2021   NSTEMI (non-ST elevated myocardial infarction) ([place]) 08/18/2021   History of nonmelanoma skin cancer 06/06/2020   Senile purpura ([place]) 05/04/2020   Nocturnal hypoxemia due to emphysema ([place]) 05/04/2020   Carotid atherosclerosis, bilateral 08/27/2018   Bilateral carotid bruits 08/27/2018   CKD (chronic kidney disease) stage 3, GFR 30-59 ml/min (HCC) 08/27/2018   Age-related osteoporosis without current pathological fracture 08/11/2018   Shoulder pain, left 08/29/2017   Acid reflux 85/46/2703   Uncomplicated opioid use 50/07/3817   Hyperglycemia 09/02/2016   Left thyroid nodule 07/13/2016   Abnormal bone scan of cervical spine 06/18/2016   Primary osteoarthritis of both hands 05/03/2016   Elevated serum alkaline phosphatase level 05/03/2016   Hyperkalemia 05/03/2016   Degenerative disc disease, thoracic 05/03/2016   Hx of smoking 04/17/2016   Controlled substance agreement signed 03/22/2016   Back pain 03/22/2016   Hyperlipidemia 10/25/2015   Essential hypertension 10/25/2015   Panlobular emphysema ([place]) 10/25/2015   Chronic pain 05/25/2015   Actinic keratosis 05/25/2015     Past Medical History:  Diagnosis Date   Acid reflux 10/15/2016   Anxiety    Closed fracture of left tibial plateau 09/13/2017   Closed nondisplaced fracture of neck of right radius 09/13/2017   Controlled substance agreement signed 03/22/2016   Degenerative  disc disease, thoracic 05/03/2016   Encounter for chronic pain management    Hx of smoking  04/17/2016   Hyperlipidemia    Hypertension    Macular degeneration    Osteoarthritis    Osteoporosis 03/22/2016   Primary osteoarthritis of both hands 05/03/2016    Past Surgical History:  Procedure Laterality Date   ABDOMINAL HYSTERECTOMY     CATARACT EXTRACTION     CORONARY STENT INTERVENTION N/A 09/12/2021   Procedure: CORONARY STENT INTERVENTION;  Surgeon: Wellington Hampshire, MD;  Location: [facility];  Service: Cardiovascular;  Laterality: N/A;   endaryerectomy     LEFT HEART CATH AND CORONARY ANGIOGRAPHY N/A 09/12/2021   Procedure: LEFT HEART CATH AND CORONARY ANGIOGRAPHY;  Surgeon: Wellington Hampshire, MD;  Location: [facility];  Service: Cardiovascular;  Laterality: N/A;   renal stenting      Social History   Socioeconomic History   Marital status: Widowed    Spouse name: Ihor Gully   Number of children: 3   Years of education: Not on file   Highest education level: 10th grade  Occupational History   Occupation: Retired  Tobacco Use   Smoking status: Former    Packs/day: 1.00    Years: 50.00    Pack years: 50.00    Types: Cigarettes    Quit date: 2017    Years since quitting: 5.8    Passive exposure: Never   Smokeless tobacco: Never   Tobacco comments:    smoking cesssation materials not required  Vaping Use   Vaping Use: Never used  Substance and Sexual Activity   Alcohol use: No    Alcohol/week: 0.0 standard drinks   Drug use: No   Sexual activity: Not Currently    Birth control/protection: None  Other Topics Concern   Not on file  Social History Narrative   Pt lives alone   Social Determinants of Health   Financial Resource Strain: Low Risk    Difficulty of Paying Living Expenses: Not hard at all  Food Insecurity: No Food Insecurity   Worried About Charity fundraiser in the Last Year: Never true   [place] in the Last Year: Never true  Transportation Needs: No Transportation Needs   Lack of Transportation (Medical): No   Lack  of Transportation (Non-Medical): No  Physical Activity: Inactive   Days of Exercise per Week: 0 days   Minutes of Exercise per Session: 0 min  Stress: No Stress Concern Present   Feeling of Stress : Only a little  Social Connections: Moderately Isolated   Frequency of Communication with Friends and Family: More than three times a week   Frequency of Social Gatherings with Friends and Family: More than three times a week   Attends Religious Services: More than 4 times per year   Active Member of [employer] or Organizations: No   Attends Archivist Meetings: Never   Marital Status: Widowed  Human resources officer Violence: Not At Risk   Fear of Current or Ex-Partner: No   Emotionally Abused: No   Physically Abused: No   Sexually Abused: No    Family History  Problem Relation Age of Onset   Cancer Mother    Coronary artery disease Father    Cerebrovascular Accident Father    Cancer Sister        breast cancer   Cancer Brother        stomach   Heart disease Daughter  heart vavle bypass?   Stroke Sister    Lung disease Brother    Cancer Brother        unknown   Arthritis Daughter      Current Facility-Administered Medications:    0.9 %  sodium chloride infusion, 250 mL, Intravenous, PRN, Neoma Laming A, MD   0.9 %  sodium chloride infusion, 250 mL, Intravenous, PRN, Wellington Hampshire, MD   acetaminophen (TYLENOL) tablet 650 mg, 650 mg, Oral, Q4H PRN, Neoma Laming A, MD, 650 mg at 09/12/21 1133   albuterol (PROVENTIL) (2.5 MG/3ML) 0.083% nebulizer solution 3 mL, 3 mL, Inhalation, Q6H PRN, Wellington Hampshire, MD, 3 mL at 09/13/21 0516   aspirin chewable tablet 81 mg, 81 mg, Oral, Daily, Wellington Hampshire, MD, 81 mg at 09/15/21 2703   heparin injection 5,000 Units, 5,000 Units, Subcutaneous, Q8H, Elodia Florence., MD, 5,000 Units at 09/15/21 5009   HYDROcodone-acetaminophen (NORCO/VICODIN) 5-325 MG per tablet 1 tablet, 1 tablet, Oral, TID PRN, Wellington Hampshire, MD,  1 tablet at 09/14/21 2116   metoprolol tartrate (LOPRESSOR) tablet 12.5 mg, 12.5 mg, Oral, BID, Elodia Florence., MD, 12.5 mg at 09/15/21 3818   nitroGLYCERIN (NITROSTAT) SL tablet 0.4 mg, 0.4 mg, Sublingual, Q5 Min x 3 PRN, Fletcher Anon, Muhammad A, MD   ondansetron (ZOFRAN) tablet 4 mg, 4 mg, Oral, Q6H PRN **OR** ondansetron (ZOFRAN) injection 4 mg, 4 mg, Intravenous, Q6H PRN, Wellington Hampshire, MD   ondansetron (ZOFRAN) injection 4 mg, 4 mg, Intravenous, Q6H PRN, Neoma Laming A, MD   pantoprazole (PROTONIX) EC tablet 40 mg, 40 mg, Oral, Daily, Fletcher Anon, Muhammad A, MD, 40 mg at 09/15/21 0824   rosuvastatin (CRESTOR) tablet 10 mg, 10 mg, Oral, Daily, Fletcher Anon, Muhammad A, MD, 10 mg at 09/15/21 0824   sodium chloride flush (NS) 0.9 % injection 3 mL, 3 mL, Intravenous, Q12H, Arida, Muhammad A, MD, 3 mL at 09/13/21 2137   sodium chloride flush (NS) 0.9 % injection 3 mL, 3 mL, Intravenous, Q12H, Neoma Laming A, MD, 3 mL at 09/14/21 2127   sodium chloride flush (NS) 0.9 % injection 3 mL, 3 mL, Intravenous, PRN, Neoma Laming A, MD   sodium chloride flush (NS) 0.9 % injection 3 mL, 3 mL, Intravenous, Q12H, Arida, Muhammad A, MD, 3 mL at 09/15/21 0825   sodium chloride flush (NS) 0.9 % injection 3 mL, 3 mL, Intravenous, PRN, Wellington Hampshire, MD   ticagrelor (BRILINTA) tablet 90 mg, 90 mg, Oral, BID, Fletcher Anon, Muhammad A, MD, 90 mg at 09/15/21 0824   umeclidinium-vilanterol (ANORO ELLIPTA) 62.5-25 MCG/ACT 1 puff, 1 puff, Inhalation, Q0600, Wellington Hampshire, MD, 1 puff at 09/15/21 0647  BP (!) 114/50 (BP Location: Right Arm)   Pulse 79   Temp 98.3 F (36.8 C)   Resp 16   Ht 5' (1.524 m)   Wt 92 lb 13 oz (42.1 kg)   SpO2 100%   BMI 18.13 kg/m    Physical Exam Vitals reviewed.  Constitutional:      Appearance: She is not ill-appearing.  HENT:     Head: Normocephalic.  Eyes:     General: No scleral icterus.    Conjunctiva/sclera: Conjunctivae normal.  Cardiovascular:     Rate and Rhythm: Normal  rate and regular rhythm.  Pulmonary:     Effort: Pulmonary effort is normal. No respiratory distress.     Breath sounds: Decreased breath sounds present. No wheezing, rhonchi or rales.  Chest:  Chest wall: No mass or tenderness.  Abdominal:     General: There is no distension.     Tenderness: There is no abdominal tenderness. There is no guarding.  Musculoskeletal:        General: No swelling or deformity.     Cervical back: Neck supple.  Lymphadenopathy:     Cervical: No cervical adenopathy.  Skin:    General: Skin is warm and dry.     Coloration: Skin is not pale.     Findings: No rash.  Neurological:     Mental Status: She is alert and oriented to person, place, and time.  Psychiatric:        Mood and Affect: Mood normal.        Behavior: Behavior normal.        Thought Content: Thought content normal.        Judgment: Judgment normal.    CMP Latest Ref Rng & Units 09/15/2021  Glucose 70 - 99 mg/dL 109(H)  BUN 8 - 23 mg/dL 25(H)  Creatinine 0.44 - 1.00 mg/dL 1.33(H)  Sodium 135 - 145 mmol/L 138  Potassium 3.5 - 5.1 mmol/L 3.4(L)  Chloride 98 - 111 mmol/L 103  CO2 22 - 32 mmol/L 28  Calcium 8.9 - 10.3 mg/dL 8.5(L)  Total Protein 6.5 - 8.1 g/dL -  Total Bilirubin 0.3 - 1.2 mg/dL -  Alkaline Phos 38 - 126 U/L -  AST 15 - 41 U/L -  ALT 0 - 44 U/L -   CBC Latest Ref Rng & Units 09/14/2021  WBC 4.0 - 10.5 K/uL 11.8(H)  Hemoglobin 12.0 - 15.0 g/dL 11.2(L)  Hematocrit 36.0 - 46.0 % 34.2(L)  Platelets 150 - 400 K/uL 192    DG Chest 1 View  Result Date: 09/09/2021 CLINICAL DATA:  Increasing shortness of breath, coronary artery disease, recent MI 3 weeks ago, pedal edema, COPD, hypertension EXAM: CHEST  1 VIEW COMPARISON:  Portable exam 1429 hours compared 08/22/2021 FINDINGS: Enlargement of cardiac silhouette with pulmonary vascular congestion. Atherosclerotic calcification aorta. Interstitial infiltrates in both lungs greatest at bases with coexistence bibasilar  effusions and atelectasis. Findings favor CHF. Suspect underlying emphysematous changes. No pneumothorax. Bones demineralized. IMPRESSION: Enlargement of cardiac silhouette with pulmonary vascular congestion and probable pulmonary edema/CHF. Small bibasilar pleural effusions and atelectasis. Underlying emphysematous changes. Aortic Atherosclerosis (ICD10-I70.0) and Emphysema (ICD10-J43.9). Electronically Signed   By: Lavonia Dana M.D.   On: 09/09/2021 14:44   DG Chest 2 View  Result Date: 08/18/2021 CLINICAL DATA:  Chest pain and shortness of breath. EXAM: CHEST - 2 VIEW COMPARISON:  October 13, 2016 FINDINGS: Diffuse, chronic appearing increased interstitial lung markings are noted. Mild to moderate severity areas of atelectasis are seen within the bilateral lung bases. There is no evidence of a pleural effusion or pneumothorax. The heart size and mediastinal contours are within normal limits. Multilevel degenerative changes seen throughout the thoracic spine. IMPRESSION: COPD with mild to moderate severity bibasilar atelectasis. Electronically Signed   By: Virgina Norfolk M.D.   On: 08/18/2021 17:32   CT CHEST WO CONTRAST  Result Date: 09/13/2021 CLINICAL DATA:  Chest pain and shortness of breath. EXAM: CT CHEST WITHOUT CONTRAST TECHNIQUE: Multidetector CT imaging of the chest was performed following the standard protocol without IV contrast. COMPARISON:  Chest radiograph dated 09/13/2021. FINDINGS: Evaluation of this exam is limited in the absence of intravenous contrast. Cardiovascular: Were top-normal cardiac size. No pericardial effusion. Advanced 3 vessel coronary vascular calcification and calcification of the mitral  annulus. Four there is advanced atherosclerotic calcification of the thoracic aorta. The central pulmonary arteries are grossly unremarkable. Mediastinum/Nodes: No hilar or mediastinal adenopathy. The esophagus is grossly unremarkable. A 1.3 cm left thyroid hypodense lesion. In the  setting of significant comorbidities or limited life expectancy, no follow-up recommended (ref: J Am Coll Radiol. 2015 Feb;12(2): 143-50).No mediastinal fluid collection. No focal or Lungs/Pleura: Small bilateral pleural effusions, left greater right. There is partial compressive atelectasis of the lower lobes versus pneumonia. Background of severe emphysema. Forty pole pole there is no pneumothorax. The central airways are patent. There is a 1.5 cm nodule in the right upper lobe with irregular or spiculated margin. Multidisciplinary consult and further evaluation with PET-CT if clinically indicated, recommended. Upper Abdomen: Partially visualized 2.5 cm left renal cyst. Musculoskeletal: Osteopenia with degenerative changes of the spine. No acute osseous pathology. IMPRESSION: 1. Small bilateral pleural effusions, left greater right with partial compressive atelectasis of the lower lobes versus pneumonia. 2. A 1.5 cm right upper lobe nodule with irregular or spiculated margin. This is concerning for malignancy. Multidisciplinary consult and further evaluation with PET-CT, if clinically indicated, recommended. 3. Aortic Atherosclerosis (ICD10-I70.0) and Emphysema (ICD10-J43.9). Electronically Signed   By: Anner Crete M.D.   On: 09/13/2021 22:58   CARDIAC CATHETERIZATION  Result Date: 09/13/2021   Ost LM lesion is 30% stenosed.   Prox Cx to Mid Cx lesion is 100% stenosed.   Prox LAD to Mid LAD lesion is 99% stenosed.   Mid LAD to Dist LAD lesion is 30% stenosed.   A drug-eluting stent was successfully placed using a STENT ONYX FRONTIER 2.5X15.   Post intervention, there is a 25% residual stenosis. 1.  Severe two-vessel coronary artery disease with chronic occlusion of proximal/mid left circumflex with left to left collaterals and severe 99% stenosis in the proximal LAD which is the likely culprit for non-ST elevation myocardial infarction. 2.  Left ventricular angiography was not performed due to underlying  chronic kidney disease. 3.  Severely elevated left ventricular end-diastolic pressure at 31 mmHg. 4.  Successful angioplasty and drug-eluting stent placement to the proximal LAD.  Suboptimal results due to inability to fully expand the stent proximally due to heavy calcifications.  The procedure was limited by a 5 [place] guide system given lack of alternative vascular access.  A small first diagonal was jailed by the stent with TIMI I flow but the vessel was too small to rescue. Recommendations: Dual antiplatelet therapy for at least 12 months. Will give 1 dose of IV furosemide given shortness of breath and significantly elevated left ventricular end-diastolic pressure. Aggressive treatment of risk factors. Please note that the case was attempted initially from the right femoral access by Dr. Humphrey Rolls but was not able to advance the wire more than few centimeters and thus femoral access was aborted and I elected for right radial artery access in spite of small right radial artery size by ultrasound.   US RENAL  Result Date: 09/11/2021 CLINICAL DATA:  Acute renal insufficiency. EXAM: RENAL / URINARY TRACT ULTRASOUND COMPLETE COMPARISON:  None. FINDINGS: Right Kidney: Renal measurements: 9.3 x 4.2 x 4.6 cm = volume: 95 mL. Echogenicity within normal limits. No hydronephrosis visualized. There is a simple cyst exophytic off of the lower right renal cortex measuring 1.8 cm in greatest dimension. Left Kidney: Renal measurements: 7.9 x 3.3 x 3.6 = volume: 48 mL. Echogenicity within normal limits. No hydronephrosis visualized. Two benign cysts seen. There is a 2.7 cm cyst in the  midpole region of the left kidney and a 1.7 cm cyst, exophytic off of the lower pole of the left kidney. Bladder: Distended with slightly lobulated contour. Other: Bilateral pleural effusions. Heterogeneous echogenicity of the liver, partially visualized. IMPRESSION: Increased renal echogenicity, consistent with medical renal disease. Bilateral  renal cysts. Heterogeneous echogenicity of the liver, partially visualized. Evaluation with right upper quadrant ultrasound or cross-sectional imaging may be considered. Bilateral pleural effusions. Electronically Signed   By: Fidela Salisbury M.D.   On: 09/11/2021 10:29   DG Chest Port 1 View  Result Date: 09/13/2021 CLINICAL DATA:  87 year old female with history of shortness of breath. EXAM: PORTABLE CHEST 1 VIEW COMPARISON:  Chest x-ray 09/11/2021. FINDINGS: Diffuse peribronchial cuffing, widespread interstitial prominence and patchy multifocal ill-defined airspace disease scattered throughout the lungs bilaterally, similar to the recent prior examination. Small bilateral pleural effusions. No pneumothorax. Heart size is normal. Upper mediastinal contours are within normal limits. Atherosclerotic calcifications in the thoracic aorta. IMPRESSION: 1. The appearance the chest is most compatible with multilobar bilateral bronchopneumonia. Aeration appears slightly worsened compared to the recent prior study. 2. Small bilateral pleural effusions. 3. Aortic atherosclerosis. Electronically Signed   By: Vinnie Langton M.D.   On: 09/13/2021 09:01   DG Chest Port 1 View  Result Date: 09/11/2021 CLINICAL DATA:  Wheezing and dyspnea, COPD EXAM: PORTABLE CHEST 1 VIEW COMPARISON:  09/09/2021 chest radiograph. FINDINGS: Stable cardiomediastinal silhouette with normal heart size. No pneumothorax. Small bilateral pleural effusions, stable. New faint hazy opacities in the upper lungs bilaterally, right greater than left. Streaky and reticular bibasilar lung opacities, stable. IMPRESSION: 1. New faint hazy opacities in the upper lungs bilaterally, right greater than left, which could represent pneumonia or mild pulmonary edema. 2. Stable small bilateral pleural effusions. 3. Stable streaky and reticular bibasilar lung opacities, favor nonspecific scarring and/or atelectasis. Electronically Signed   By: Ilona Sorrel  M.D.   On: 09/11/2021 08:01   DG Chest Port 1 View  Result Date: 08/22/2021 CLINICAL DATA:  COPD. EXAM: PORTABLE CHEST 1 VIEW COMPARISON:  Chest radiograph, 08/18/2021 and 10/13/2016. FINDINGS: Cardiac silhouette is within normal limits. Aortic vascular calcifications. Pulmonary hyperinflation with coarse bibasilar opacities no discrete consolidation. No large pleural effusion or pneumothorax. No acute osseous abnormality. IMPRESSION: 1. Obstructive pulmonary disease with increased prominence of coarse bibasilar opacities. Findings may reflect atelectasis though early superimposed pneumonia can appear similar. 2.  Aortic Atherosclerosis (ICD10-I70.0). Electronically Signed   By: Michaelle Birks M.D.   On: 08/22/2021 07:52   ECHOCARDIOGRAM COMPLETE  Result Date: 08/21/2021    ECHOCARDIOGRAM REPORT   Patient Name:   QUIANA COBAUGH [facility] Date of Exam: 08/19/2021 Medical Rec #:  [id number]       Height:       60.0 in Accession #:    [id number]      Weight:       107.6 lb Date of Birth:  05-14-1929       BSA:          1.434 m Patient Age:    63 years        BP:           107/47 mmHg Patient Gender: F               HR:           81 bpm. Exam Location:  [facility] Procedure: 2D Echo, Color Doppler and Cardiac Doppler Indications:     I21.4 NSTEMI  History:  Patient has prior history of Echocardiogram examinations. Risk                  Factors:Former Smoker, Hypertension and Dyslipidemia.  Sonographer:     Charmayne Sheer Referring Phys:  [id number] Lequita Halt Diagnosing Phys: Neoma Laming  Sonographer Comments: Image acquisition challenging due to uncooperative patient. IMPRESSIONS  1. Left ventricular ejection fraction, by estimation, is 35 to 40%. The left ventricle has moderately decreased function. The left ventricle demonstrates global hypokinesis. There is severe asymmetric left ventricular hypertrophy of the basal-septal segment. Left ventricular diastolic parameters are consistent with Grade III diastolic dysfunction  (restrictive).  2. Right ventricular systolic function is normal. The right ventricular size is normal.  3. Left atrial size was moderately dilated.  4. Right atrial size was moderately dilated.  5. The mitral valve is degenerative. Trivial mitral valve regurgitation. Mild mitral stenosis. Severe mitral annular calcification.  6. The aortic valve is calcified. Aortic valve regurgitation is trivial. Mild to moderate aortic valve sclerosis/calcification is present, without any evidence of aortic stenosis.  7. The inferior vena cava is normal in size with greater than 50% respiratory variability, suggesting right atrial pressure of 3 mmHg. FINDINGS  Left Ventricle: Left ventricular ejection fraction, by estimation, is 35 to 40%. The left ventricle has moderately decreased function. The left ventricle demonstrates global hypokinesis. The left ventricular internal cavity size was normal in size. There is severe asymmetric left ventricular hypertrophy of the basal-septal segment. Left ventricular diastolic parameters are consistent with Grade III diastolic dysfunction (restrictive). Right Ventricle: The right ventricular size is normal. No increase in right ventricular wall thickness. Right ventricular systolic function is normal. Left Atrium: Left atrial size was moderately dilated. Right Atrium: Right atrial size was moderately dilated. Pericardium: There is no evidence of pericardial effusion. Mitral Valve: The mitral valve is degenerative in appearance. There is severe calcification of the mitral valve leaflet(s). Mildly decreased mobility of the mitral valve leaflets. Severe mitral annular calcification. Trivial mitral valve regurgitation. Mild mitral valve stenosis. MV peak gradient, 5.6 mmHg. The mean mitral valve gradient is 3.0 mmHg. Tricuspid Valve: The tricuspid valve is normal in structure. Tricuspid valve regurgitation is mild . No evidence of tricuspid stenosis. Aortic Valve: The aortic valve is calcified.  Aortic valve regurgitation is trivial. Mild to moderate aortic valve sclerosis/calcification is present, without any evidence of aortic stenosis. Aortic valve mean gradient measures 12.0 mmHg. Aortic valve peak gradient measures 21.2 mmHg. Aortic valve area, by VTI measures 1.65 cm. Pulmonic Valve: The pulmonic valve was normal in structure. Pulmonic valve regurgitation is not visualized. No evidence of pulmonic stenosis. Aorta: The aortic root is normal in size and structure. Venous: The inferior vena cava is normal in size with greater than 50% respiratory variability, suggesting right atrial pressure of 3 mmHg. IAS/Shunts: No atrial level shunt detected by color flow Doppler.  LEFT VENTRICLE PLAX 2D LVIDd:         2.70 cm     Diastology LVIDs:         2.50 cm     LV e' medial:    3.37 cm/s LV PW:         1.30 cm     LV E/e' medial:  30.3 LV IVS:        1.30 cm     LV e' lateral:   4.46 cm/s LVOT diam:     2.20 cm     LV E/e' lateral: 22.9 LV SV:  60 LV SV Index:   42 LVOT Area:     3.80 cm  LV Volumes (MOD) LV vol d, MOD A2C: 43.5 ml LV vol d, MOD A4C: 21.8 ml LV vol s, MOD A2C: 23.6 ml LV vol s, MOD A4C: 17.5 ml LV SV MOD A2C:     19.9 ml LV SV MOD A4C:     21.8 ml LV SV MOD BP:      12.2 ml RIGHT VENTRICLE RV Basal diam:  2.80 cm LEFT ATRIUM             Index        RIGHT ATRIUM          Index LA diam:        3.50 cm 2.44 cm/m   RA Area:     6.86 cm LA Vol (A2C):   30.8 ml 21.48 ml/m  RA Volume:   11.90 ml 8.30 ml/m LA Vol (A4C):   39.6 ml 27.61 ml/m LA Biplane Vol: 35.8 ml 24.96 ml/m  AORTIC VALVE                     PULMONIC VALVE AV Area (Vmax):    1.37 cm      PV Vmax:       0.89 m/s AV Area (Vmean):   1.38 cm      PV Vmean:      67.700 cm/s AV Area (VTI):     1.65 cm      PV VTI:        0.169 m AV Vmax:           230.00 cm/s   PV Peak grad:  3.2 mmHg AV Vmean:          165.000 cm/s  PV Mean grad:  2.0 mmHg AV VTI:            0.364 m AV Peak Grad:      21.2 mmHg AV Mean Grad:      12.0  mmHg LVOT Vmax:         82.90 cm/s LVOT Vmean:        59.800 cm/s LVOT VTI:          0.158 m LVOT/AV VTI ratio: 0.43  AORTA Ao Root diam: 2.90 cm MITRAL VALVE MV Area (PHT): 3.77 cm     SHUNTS MV Area VTI:   1.73 cm     Systemic VTI:  0.16 m MV Peak grad:  5.6 mmHg     Systemic Diam: 2.20 cm MV Mean grad:  3.0 mmHg MV Vmax:       1.18 m/s MV Vmean:      81.4 cm/s MV Decel Time: 201 msec MV E velocity: 102.00 cm/s MV A velocity: 116.00 cm/s MV E/A ratio:  0.88 Shaukat Khan Electronically signed by Neoma Laming Signature Date/Time: 08/21/2021/3:12:23 PM    Final      Assessment and plan- Patient is a 87 y.o. female currently admitted for NSTEMI s/p stent placement found to have spiculated 1.5 cm right upper lobe lung nodule concerning for primary lung carcinoma.  Findings and concerns discussed with patient and her 2 daughters today.  Spoke to daughters by phone.  Given her recent STEMI and stent placement, I spoke to cardiology who would not recommend holding her anticoagulation at this time.  Therefore, risk of bleeding for biopsy may outweigh benefit.  We discussed having patient evaluated in the outpatient setting by medical oncology.  At  that time, would consider PET scan and possible referral to radiation oncology for SBRT for presumptive primary lung malignancy.  Patient and her daughters are in agreement.  We will coordinate outpatient appointments upon discharge.  Delaney Meigs, DNP [place] [facility] 09/15/2021

## 2021-09-15 NOTE — Progress Notes (Signed)
SUBJECTIVE:  Status post PCI with drug-eluting stent of the mid LAD and non-STEMI. Patient denies chest pain this morning. Continues to have shortness of breath, worse with exertion   Vitals:   09/14/21 2116 09/15/21 0040 09/15/21 0400 09/15/21 0821  BP: 106/78 (!) 95/52 (!) 94/50 (!) 114/50  Pulse: 86 73 70 79  Resp:  16 17 16   Temp:  98.5 F (36.9 C) 98.3 F (36.8 C) 98.3 F (36.8 C)  TempSrc:   Oral   SpO2:  99% 99% 100%  Weight:   42.1 kg   Height:        Intake/Output Summary (Last 24 hours) at 09/15/2021 0839 Last data filed at 09/15/2021 0450 Gross per 24 hour  Intake 360 ml  Output 550 ml  Net -190 ml    LABS: Basic Metabolic Panel: Recent Labs    09/13/21 0555 09/14/21 0510 09/15/21 0526  NA 141 141 138  K 4.5 3.8 3.4*  CL 106 105 103  CO2 24 26 28   GLUCOSE 121* 112* 109*  BUN 24* 25* 25*  CREATININE 1.32* 1.28* 1.33*  CALCIUM 8.9 8.9 8.5*  MG 2.3 2.5* 2.2  PHOS 2.6 2.9  --    Liver Function Tests: Recent Labs    09/13/21 0555 09/14/21 0510  AST 66* 44*  ALT 21 19  ALKPHOS 123 119  BILITOT 1.9* 1.6*  PROT 7.0 7.0  ALBUMIN 3.3* 3.3*   No results for input(s): LIPASE, AMYLASE in the last 72 hours. CBC: Recent Labs    09/13/21 0555 09/14/21 0510  WBC 9.0 11.8*  NEUTROABS 6.4 8.9*  HGB 12.0 11.2*  HCT 36.6 34.2*  MCV 99.5 100.0  PLT 205 192   Cardiac Enzymes: No results for input(s): CKTOTAL, CKMB, CKMBINDEX, TROPONINI in the last 72 hours. BNP: Invalid input(s): POCBNP D-Dimer: No results for input(s): DDIMER in the last 72 hours. Hemoglobin A1C: No results for input(s): HGBA1C in the last 72 hours. Fasting Lipid Panel: No results for input(s): CHOL, HDL, LDLCALC, TRIG, CHOLHDL, LDLDIRECT in the last 72 hours. Thyroid Function Tests: No results for input(s): TSH, T4TOTAL, T3FREE, THYROIDAB in the last 72 hours.  Invalid input(s): FREET3 Anemia Panel: No results for input(s): VITAMINB12, FOLATE, FERRITIN, TIBC, IRON, RETICCTPCT  in the last 72 hours.   PHYSICAL EXAM General: Well developed, well nourished, in no acute distress HEENT:  Normocephalic and atramatic Neck:  No JVD.  Lungs: Clear bilaterally to auscultation and percussion. Heart: HRRR . Normal S1 and S2 without gallops or murmurs.  Abdomen: Bowel sounds are positive, abdomen soft and non-tender  Msk:  Back normal, normal gait. Normal strength and tone for age. Extremities: No clubbing, cyanosis or edema.   Neuro: Alert and oriented X 3. Psych:  Good affect, responds appropriately  TELEMETRY: sinus rhythm, HR 72 bpm  ASSESSMENT AND PLAN: Patient stable cardiac wise. Continued shortness of breath. CT chest 09/13/21 revealed small bilateral pleural effusions, left greater than right, partial compressive atelectasis of lower lobes vs. Pneumonia, right upper lobe lung nodule. Oncology consult placed. Patient cleared to go home from a cardiac standpoint with follow up in office on 09/22/21 at 1:15.   Principal Problem:   NSTEMI (non-ST elevated myocardial infarction) ([place]) Active Problems:   Actinic keratosis   Hyperlipidemia   Essential hypertension   Panlobular emphysema (HCC)   Hx of smoking   Primary osteoarthritis of both hands   Acid reflux   Senile purpura (HCC)   Nocturnal hypoxemia due to emphysema ([place])  Acute systolic CHF (congestive heart failure) (HCC)   Elevated troponin    Arvo Ealy, FNP-C 09/15/2021 8:39 AM

## 2021-09-16 DIAGNOSIS — I214 Non-ST elevation (NSTEMI) myocardial infarction: Secondary | ICD-10-CM | POA: Diagnosis not present

## 2021-09-16 LAB — BASIC METABOLIC PANEL
Anion gap: 8 (ref 5–15)
BUN: 24 mg/dL — ABNORMAL HIGH (ref 8–23)
CO2: 27 mmol/L (ref 22–32)
Calcium: 8.9 mg/dL (ref 8.9–10.3)
Chloride: 103 mmol/L (ref 98–111)
Creatinine, Ser: 1.19 mg/dL — ABNORMAL HIGH (ref 0.44–1.00)
GFR, Estimated: 44 mL/min — ABNORMAL LOW (ref >60)
Glucose, Bld: 111 mg/dL — ABNORMAL HIGH (ref 70–99)
Potassium: 3.9 mmol/L (ref 3.5–5.1)
Sodium: 138 mmol/L (ref 135–145)

## 2021-09-16 LAB — MAGNESIUM: Magnesium: 2.3 mg/dL (ref 1.7–2.4)

## 2021-09-16 LAB — RESP PANEL BY RT-PCR (FLU A&B, COVID) ARPGX2
Influenza A by PCR: NEGATIVE
Influenza B by PCR: NEGATIVE
SARS Coronavirus 2 by RT PCR: NEGATIVE

## 2021-09-16 MED ORDER — FUROSEMIDE 10 MG/ML IJ SOLN: 40.0000 mg | Freq: Once | INTRAMUSCULAR | Status: DC

## 2021-09-16 NOTE — TOC Progression Note (Signed)
Transition of Care [facility]) - Progression Note    Patient Details  Name: Samantha Dawson MRN: [id number] Date of Birth: April 27, 1929  Transition of [facility]) CM/SW Contact  Eileen Stanford, LCSW Phone Number: 09/16/2021, 3:24 PM  Clinical Narrative:   Josem Kaufmann pending, awaiting response from [place] at [facility] about bed this weekend.    Expected Discharge Plan: [place] Barriers to Discharge: Continued Medical Work up  Expected Discharge Plan and Services Expected Discharge Plan: [place] Choice: [place] arrangements for the past 2 months: Single Family Home                                       Social Determinants of Health (SDOH) Interventions    Readmission Risk Interventions No flowsheet data found.

## 2021-09-16 NOTE — Progress Notes (Signed)
PROGRESS NOTE    Samantha Dawson  WRU:[id number] DOB: Mar 31, 1929 DOA: 09/09/2021 PCP: Steele Sizer, MD  [id number]   Assessment & Plan:   Principal Problem:   NSTEMI (non-ST elevated myocardial infarction) [facility]) Active Problems:   Actinic keratosis   Hyperlipidemia   Essential hypertension   Panlobular emphysema (HCC)   Hx of smoking   Primary osteoarthritis of both hands   Acid reflux   Senile purpura (HCC)   Nocturnal hypoxemia due to emphysema (HCC)   Acute systolic CHF (congestive heart failure) (HCC)   Elevated troponin   Lung mass    Samantha Dawson is an 87 year old female with hx of CAD, combined heart failure, hypertension, prior tobacco use, COPD on 2L at baseline, who was admitted for presumed combined heart failure exacerbation, complicated by NSTEMI.  Now s/p catheterization.   # Acute on chronic Hypoxic Respiratory Failure 2/2  Acute on chronic combined CHF exacerbation --88% on 2L, needed up to 5L O2 --received intermittent IV lasix when BP allowed -- CXR concerning for multilobar bilateral bronchopneumonia, however, pt doesn't have any fever or infectious symptoms, procal remained neg, suspect this represents HF. --sating well on baseline 2L now after diuresis Plan: --Continue supplemental O2 to keep sats between 88-92% --Hold abx   # NSTEMI s/p sent CAD -- troponin peaked at 44  -- LHC with severe 2 vessel CAD with chronic occlusion of proxima/mid L circumflex with left to left collaterals and severe 99% stenosis in proximal LAD which is likely culprit for NSTEMI -> s/p angioplastic and DES to proximal LAD (25% residual stenosis)  Plan: --cont ASA and Brilinta, need at least 12 months --cont ASA, statin   # Acute kidney injury --baseline ~0.8.  presented Cr 1.14, peaked at 1.42,  in setting of HF, diuresis, and contrast --renal US - negative for hydro   # Hyperlipidemia --cont statin   # COPD on chronic 2L at baseline --cont home  bronchodilator   # Hypokalemia --monitor and replete PRN  # 1.5 cm right upper lobe nodule with irregular or spiculated margin --incidental finding on CT chest --oncology consulted --can't hold DAPT now for biopsy given recent stent Plan: --follow up with oncology outpatient --plan for outpatient PET scan   DVT prophylaxis: Heparin SQ Code Status: DNR  Family Communication:   Level of care: Progressive Cardiac Dispo:   The patient is from: home Anticipated d/c is to: SNF Anticipated d/c date is: whenever bed available  Patient currently is medically ready to d/c.   Subjective and Interval History:  Pt reported some minor nose bleed.  Cr and O2 requirement improved with diuresis.   Objective: Vitals:   09/16/21 0755 09/16/21 0951 09/16/21 1214 09/16/21 1552  BP: (!) 90/50 (!) 106/45 103/63 (!) 109/53  Pulse: 67 78 77 75  Resp: 18 20 20 18   Temp: 98.1 F (36.7 C)  98 F (36.7 C) 98 F (36.7 C)  TempSrc:      SpO2: 100% 100% 99% 99%  Weight:      Height:        Intake/Output Summary (Last 24 hours) at 09/16/2021 1755 Last data filed at 09/16/2021 0940 Gross per 24 hour  Intake 720 ml  Output 1000 ml  Net -280 ml   Filed Weights   09/09/21 1400 09/11/21 1342 09/15/21 0400  Weight: 46.3 kg 45.6 kg 42.1 kg    Examination:   Constitutional: NAD, AAOx3, frail  HEENT: conjunctivae and lids normal, EOMI CV: No cyanosis.  RESP: normal respiratory effort, on 2L Extremities: No effusions, edema in BLE SKIN: warm, dry Neuro: II - XII grossly intact.   Psych: Normal mood and affect.  Appropriate judgement and reason   Data Reviewed: I have personally reviewed following labs and imaging studies  CBC: Recent Labs  Lab 09/10/21 0647 09/11/21 0532 09/12/21 0457 09/13/21 0555 09/14/21 0510  WBC 7.7 9.4 8.4 9.0 11.8*  NEUTROABS  --  6.1 5.3 6.4 8.9*  HGB 12.1 13.3 12.0 12.0 11.2*  HCT 35.8* 38.7 35.9* 36.6 34.2*  MCV 98.1 97.0 97.8 99.5 100.0  PLT 234  272 243 205 283   Basic Metabolic Panel: Recent Labs  Lab 09/09/21 1956 09/10/21 0647 09/11/21 0532 09/12/21 0457 09/13/21 0555 09/14/21 0510 09/15/21 0526 09/16/21 0623  NA  --    < > 140 140 141 141 138 138  K  --    < > 3.5 3.0* 4.5 3.8 3.4* 3.9  CL  --    < > 102 99 106 105 103 103  CO2  --    < > 27 32 24 26 28 27   GLUCOSE  --    < > 152* 117* 121* 112* 109* 111*  BUN  --    < > 25* 27* 24* 25* 25* 24*  CREATININE  --    < > 1.33* 1.42* 1.32* 1.28* 1.33* 1.19*  CALCIUM  --    < > 9.2 9.1 8.9 8.9 8.5* 8.9  MG 2.2  --  2.2 2.2 2.3 2.5* 2.2 2.3  PHOS 2.0*  --  2.3* 3.4 2.6 2.9  --   --    < > = values in this interval not displayed.   GFR: Estimated Creatinine Clearance: 21.3 mL/min (A) (by C-G formula based on SCr of 1.19 mg/dL (H)). Liver Function Tests: Recent Labs  Lab 09/11/21 0532 09/12/21 0457 09/13/21 0555 09/14/21 0510  AST 36 67* 66* 44*  ALT 22 22 21 19   ALKPHOS 143* 129* 123 119  BILITOT 1.9* 1.7* 1.9* 1.6*  PROT 7.7 7.1 7.0 7.0  ALBUMIN 3.6 3.4* 3.3* 3.3*   No results for input(s): LIPASE, AMYLASE in the last 168 hours. No results for input(s): AMMONIA in the last 168 hours. Coagulation Profile: Recent Labs  Lab 09/09/21 1956  INR 1.1   Cardiac Enzymes: No results for input(s): CKTOTAL, CKMB, CKMBINDEX, TROPONINI in the last 168 hours. BNP (last 3 results) No results for input(s): PROBNP in the last 8760 hours. HbA1C: No results for input(s): HGBA1C in the last 72 hours. CBG: Recent Labs  Lab 09/12/21 2059  GLUCAP 117*   Lipid Profile: No results for input(s): CHOL, HDL, LDLCALC, TRIG, CHOLHDL, LDLDIRECT in the last 72 hours. Thyroid Function Tests: No results for input(s): TSH, T4TOTAL, FREET4, T3FREE, THYROIDAB in the last 72 hours. Anemia Panel: No results for input(s): VITAMINB12, FOLATE, FERRITIN, TIBC, IRON, RETICCTPCT in the last 72 hours. Sepsis Labs: Recent Labs  Lab 09/09/21 1956 09/14/21 0510  PROCALCITON <0.10 0.11     Recent Results (from the past 240 hour(s))  Resp Panel by RT-PCR (Flu A&B, Covid) Nasopharyngeal Swab     Status: None   Collection Time: 09/09/21  6:51 PM   Specimen: Nasopharyngeal Swab; Nasopharyngeal(NP) swabs in vial transport medium  Result Value Ref Range Status   SARS Coronavirus 2 by RT PCR NEGATIVE NEGATIVE Final    Comment: (NOTE) SARS-CoV-2 target nucleic acids are NOT DETECTED.  The SARS-CoV-2 RNA is generally detectable in upper respiratory specimens  during the acute phase of infection. The lowest concentration of SARS-CoV-2 viral copies this assay can detect is 138 copies/mL. A negative result does not preclude SARS-Cov-2 infection and should not be used as the sole basis for treatment or other patient management decisions. A negative result may occur with  improper specimen collection/handling, submission of specimen other than nasopharyngeal swab, presence of viral mutation(s) within the areas targeted by this assay, and inadequate number of viral copies(<138 copies/mL). A negative result must be combined with clinical observations, patient history, and epidemiological information. The expected result is Negative.  Fact Sheet for Patients:  EntrepreneurPulse.com.au  Fact Sheet for Healthcare Providers:  IncredibleEmployment.be  This test is no t yet approved or cleared by the [place] FDA and  has been authorized for detection and/or diagnosis of SARS-CoV-2 by FDA under an Emergency Use Authorization (EUA). This EUA will remain  in effect (meaning this test can be used) for the duration of the COVID-19 declaration under Section 564(b)(1) of the Act, 21 U.S.C.section 360bbb-3(b)(1), unless the authorization is terminated  or revoked sooner.       Influenza A by PCR NEGATIVE NEGATIVE Final   Influenza B by PCR NEGATIVE NEGATIVE Final    Comment: (NOTE) The Xpert Xpress SARS-CoV-2/FLU/RSV plus assay is intended as an  aid in the diagnosis of influenza from Nasopharyngeal swab specimens and should not be used as a sole basis for treatment. Nasal washings and aspirates are unacceptable for Xpert Xpress SARS-CoV-2/FLU/RSV testing.  Fact Sheet for Patients: EntrepreneurPulse.com.au  Fact Sheet for Healthcare Providers: IncredibleEmployment.be  This test is not yet approved or cleared by the [place] FDA and has been authorized for detection and/or diagnosis of SARS-CoV-2 by FDA under an Emergency Use Authorization (EUA). This EUA will remain in effect (meaning this test can be used) for the duration of the COVID-19 declaration under Section 564(b)(1) of the Act, 21 U.S.C. section 360bbb-3(b)(1), unless the authorization is terminated or revoked.  Performed at [facility], [address] [place], [place] [address]   Resp Panel by RT-PCR (Flu A&B, Covid) Nasopharyngeal Swab     Status: None   Collection Time: 09/16/21  1:52 PM   Specimen: Nasopharyngeal Swab; Nasopharyngeal(NP) swabs in vial transport medium  Result Value Ref Range Status   SARS Coronavirus 2 by RT PCR NEGATIVE NEGATIVE Final    Comment: (NOTE) SARS-CoV-2 target nucleic acids are NOT DETECTED.  The SARS-CoV-2 RNA is generally detectable in upper respiratory specimens during the acute phase of infection. The lowest concentration of SARS-CoV-2 viral copies this assay can detect is 138 copies/mL. A negative result does not preclude SARS-Cov-2 infection and should not be used as the sole basis for treatment or other patient management decisions. A negative result may occur with  improper specimen collection/handling, submission of specimen other than nasopharyngeal swab, presence of viral mutation(s) within the areas targeted by this assay, and inadequate number of viral copies(<138 copies/mL). A negative result must be combined with clinical observations, patient history, and  epidemiological information. The expected result is Negative.  Fact Sheet for Patients:  EntrepreneurPulse.com.au  Fact Sheet for Healthcare Providers:  IncredibleEmployment.be  This test is no t yet approved or cleared by the [place] FDA and  has been authorized for detection and/or diagnosis of SARS-CoV-2 by FDA under an Emergency Use Authorization (EUA). This EUA will remain  in effect (meaning this test can be used) for the duration of the COVID-19 declaration under Section 564(b)(1) of the Act, 21  U.S.C.section 360bbb-3(b)(1), unless the authorization is terminated  or revoked sooner.       Influenza A by PCR NEGATIVE NEGATIVE Final   Influenza B by PCR NEGATIVE NEGATIVE Final    Comment: (NOTE) The Xpert Xpress SARS-CoV-2/FLU/RSV plus assay is intended as an aid in the diagnosis of influenza from Nasopharyngeal swab specimens and should not be used as a sole basis for treatment. Nasal washings and aspirates are unacceptable for Xpert Xpress SARS-CoV-2/FLU/RSV testing.  Fact Sheet for Patients: EntrepreneurPulse.com.au  Fact Sheet for Healthcare Providers: IncredibleEmployment.be  This test is not yet approved or cleared by the [place] FDA and has been authorized for detection and/or diagnosis of SARS-CoV-2 by FDA under an Emergency Use Authorization (EUA). This EUA will remain in effect (meaning this test can be used) for the duration of the COVID-19 declaration under Section 564(b)(1) of the Act, 21 U.S.C. section 360bbb-3(b)(1), unless the authorization is terminated or revoked.  Performed at [facility], [address], [place], [place] [address]       Radiology Studies: No results found.   Scheduled Meds:  aspirin  81 mg Oral Daily   heparin injection (subcutaneous)  5,000 Units Subcutaneous Q8H   metoprolol tartrate  12.5 mg Oral BID   pantoprazole  40 mg  Oral Daily   rosuvastatin  10 mg Oral Daily   sodium chloride flush  3 mL Intravenous Q12H   sodium chloride flush  3 mL Intravenous Q12H   sodium chloride flush  3 mL Intravenous Q12H   ticagrelor  90 mg Oral BID   umeclidinium-vilanterol  1 puff Inhalation Q0600   Continuous Infusions:  sodium chloride     sodium chloride       LOS: 6 days     Enzo Bi, MD [facility] If 7PM-7AM, please contact night-coverage 09/16/2021, 5:55 PM

## 2021-09-16 NOTE — Progress Notes (Signed)
Samantha Dawson   DOB:28-Aug-1929   XN#:[id number]   WHQ#:[id number]  Subjective: Remains hospitalized post NSTEMI and stent placement. Energy improving. Eager to go home or to rehab. Daughter at bedside.    Objective:  Vitals:   09/16/21 0951 09/16/21 1214  BP: (!) 106/45 103/63  Pulse: 78 77  Resp: 20 20  Temp:  98 F (36.7 C)  SpO2: 100% 99%    Body mass index is 18.13 kg/m.  Intake/Output Summary (Last 24 hours) at 09/16/2021 1244 Last data filed at 09/16/2021 0940 Gross per 24 hour  Intake 720 ml  Output 1000 ml  Net -280 ml    General: appears stated age Eyes: Pink conjunctiva, anicteric sclera. Lungs: Clear to auscultation bilaterally.  No audible wheezing or coughing Heart: Regular rate and rhythm.  Abdomen: Soft, nontender, nondistended.  Neuro: Alert, answering all questions appropriately. Hard of hearing Skin: No rashes or petechiae noted. Psych: Normal affect.   Labs:  Lab Results  Component Value Date   WBC 11.8 (H) 09/14/2021   HGB 11.2 (L) 09/14/2021   HCT 34.2 (L) 09/14/2021   MCV 100.0 09/14/2021   PLT 192 09/14/2021   NEUTROABS 8.9 (H) 65/99/3570    Basic Metabolic Panel: Recent Labs  Lab 09/09/21 1956 09/10/21 0647 09/11/21 0532 09/12/21 0457 09/13/21 0555 09/14/21 0510 09/15/21 0526 09/16/21 0623  NA  --    < > 140 140 141 141 138 138  K  --    < > 3.5 3.0* 4.5 3.8 3.4* 3.9  CL  --    < > 102 99 106 105 103 103  CO2  --    < > 27 32 24 26 28 27   GLUCOSE  --    < > 152* 117* 121* 112* 109* 111*  BUN  --    < > 25* 27* 24* 25* 25* 24*  CREATININE  --    < > 1.33* 1.42* 1.32* 1.28* 1.33* 1.19*  CALCIUM  --    < > 9.2 9.1 8.9 8.9 8.5* 8.9  MG 2.2  --  2.2 2.2 2.3 2.5* 2.2 2.3  PHOS 2.0*  --  2.3* 3.4 2.6 2.9  --   --    < > = values in this interval not displayed.   GFR Estimated Creatinine Clearance: 21.3 mL/min (A) (by C-G formula based on SCr of 1.19 mg/dL (H)). Liver Function Tests: Recent Labs  Lab 09/09/21 1402 09/11/21 0532  09/12/21 0457 09/13/21 0555 09/14/21 0510  AST 34 36 67* 66* 44*  ALT 22 22 22 21 19   ALKPHOS 145* 143* 129* 123 119  BILITOT 1.5* 1.9* 1.7* 1.9* 1.6*  PROT 7.2 7.7 7.1 7.0 7.0  ALBUMIN 3.5 3.6 3.4* 3.3* 3.3*   No results for input(s): LIPASE, AMYLASE in the last 168 hours. No results for input(s): AMMONIA in the last 168 hours. Coagulation profile Recent Labs  Lab 09/09/21 1956  INR 1.1    CBC: Recent Labs  Lab 09/09/21 1402 09/10/21 0647 09/11/21 0532 09/12/21 0457 09/13/21 0555 09/14/21 0510  WBC 7.9 7.7 9.4 8.4 9.0 11.8*  NEUTROABS 5.9  --  6.1 5.3 6.4 8.9*  HGB 12.1 12.1 13.3 12.0 12.0 11.2*  HCT 36.7 35.8* 38.7 35.9* 36.6 34.2*  MCV 98.7 98.1 97.0 97.8 99.5 100.0  PLT 246 234 272 243 205 192    CBG: Recent Labs  Lab 09/12/21 2059  GLUCAP 117*    Anemia work up No results for input(s): VITAMINB12, FOLATE, FERRITIN, TIBC,  IRON, RETICCTPCT in the last 72 hours. Microbiology Recent Results (from the past 240 hour(s))  Resp Panel by RT-PCR (Flu A&B, Covid) Nasopharyngeal Swab     Status: None   Collection Time: 09/09/21  6:51 PM   Specimen: Nasopharyngeal Swab; Nasopharyngeal(NP) swabs in vial transport medium  Result Value Ref Range Status   SARS Coronavirus 2 by RT PCR NEGATIVE NEGATIVE Final    Comment: (NOTE) SARS-CoV-2 target nucleic acids are NOT DETECTED.  The SARS-CoV-2 RNA is generally detectable in upper respiratory specimens during the acute phase of infection. The lowest concentration of SARS-CoV-2 viral copies this assay can detect is 138 copies/mL. A negative result does not preclude SARS-Cov-2 infection and should not be used as the sole basis for treatment or other patient management decisions. A negative result may occur with  improper specimen collection/handling, submission of specimen other than nasopharyngeal swab, presence of viral mutation(s) within the areas targeted by this assay, and inadequate number of viral copies(<138  copies/mL). A negative result must be combined with clinical observations, patient history, and epidemiological information. The expected result is Negative.  Fact Sheet for Patients:  EntrepreneurPulse.com.au  Fact Sheet for Healthcare Providers:  IncredibleEmployment.be  This test is no t yet approved or cleared by the [place] FDA and  has been authorized for detection and/or diagnosis of SARS-CoV-2 by FDA under an Emergency Use Authorization (EUA). This EUA will remain  in effect (meaning this test can be used) for the duration of the COVID-19 declaration under Section 564(b)(1) of the Act, 21 U.S.C.section 360bbb-3(b)(1), unless the authorization is terminated  or revoked sooner.       Influenza A by PCR NEGATIVE NEGATIVE Final   Influenza B by PCR NEGATIVE NEGATIVE Final    Comment: (NOTE) The Xpert Xpress SARS-CoV-2/FLU/RSV plus assay is intended as an aid in the diagnosis of influenza from Nasopharyngeal swab specimens and should not be used as a sole basis for treatment. Nasal washings and aspirates are unacceptable for Xpert Xpress SARS-CoV-2/FLU/RSV testing.  Fact Sheet for Patients: EntrepreneurPulse.com.au  Fact Sheet for Healthcare Providers: IncredibleEmployment.be  This test is not yet approved or cleared by the [place] FDA and has been authorized for detection and/or diagnosis of SARS-CoV-2 by FDA under an Emergency Use Authorization (EUA). This EUA will remain in effect (meaning this test can be used) for the duration of the COVID-19 declaration under Section 564(b)(1) of the Act, 21 U.S.C. section 360bbb-3(b)(1), unless the authorization is terminated or revoked.  Performed at [facility], [address], [place], [place] [address]       Studies:  No results found.  Assessment: 87 y.o. 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 Not a good candidate for biopsy based on recent NSTEMI and current blood thinner use in addition to general frailty.    Plan: will plan for patient to be seen in medical oncology clinic after she has been discharged from hospital. I will reach out to [facility], lung navigator, to help coordinate outpatient appointments and ordering of outpatient PET. After PET would like for her to see Dr Tasia Catchings (medical oncology) and Dr Baruch Gouty (radiation oncology) for results and consideration of SBRT.     Verlon Au, NP 09/16/2021   Medical On[facility]

## 2021-09-16 NOTE — TOC Progression Note (Signed)
Transition of Care [facility]) - Progression Note    Patient Details  Name: Samantha Dawson MRN: [id number] Date of Birth: 1929-08-03  Transition of Care [name]) CM/SW Contact  Eileen Stanford, LCSW Phone Number: 09/16/2021, 1:22 PM  Clinical Narrative:   CSW provided bed offers to daughter [place]. They choose [name] at this time. Josem Kaufmann has been started.    Expected Discharge Plan: [place] Barriers to Discharge: Continued Medical Work up  Expected Discharge Plan and Services Expected Discharge Plan: [place] Choice: [place] arrangements for the past 2 months: Single Family Home                                       Social Determinants of Health (SDOH) Interventions    Readmission Risk Interventions No flowsheet data found.

## 2021-09-17 LAB — BASIC METABOLIC PANEL
Anion gap: 8 (ref 5–15)
BUN: 25 mg/dL — ABNORMAL HIGH (ref 8–23)
CO2: 30 mmol/L (ref 22–32)
Calcium: 9.1 mg/dL (ref 8.9–10.3)
Chloride: 103 mmol/L (ref 98–111)
Creatinine, Ser: 1.16 mg/dL — ABNORMAL HIGH (ref 0.44–1.00)
GFR, Estimated: 45 mL/min — ABNORMAL LOW (ref >60)
Glucose, Bld: 103 mg/dL — ABNORMAL HIGH (ref 70–99)
Potassium: 3.8 mmol/L (ref 3.5–5.1)
Sodium: 141 mmol/L (ref 135–145)

## 2021-09-17 LAB — MAGNESIUM: Magnesium: 2.4 mg/dL (ref 1.7–2.4)

## 2021-09-17 MED ORDER — TICAGRELOR 90 MG PO TABS: 90.0000 mg | ORAL_TABLET | Freq: Two times a day (BID) | ORAL | 3 refills | Status: DC

## 2021-09-17 NOTE — TOC Transition Note (Signed)
Transition of Care [facility]) - CM/SW Discharge Note   Patient Details  Name: AIREANNA LUELLEN MRN: [id number] Date of Birth: Dec 11, 1928  Transition of Care [facility]) CM/SW Contact:  Shelbie Hutching, RN Phone Number: 09/17/2021, 3:29 PM   Clinical Narrative:    Patient wants to go home, she reports that she has a rolling walker at home.  Daughter [name] will provide her transport home today.  Home Health services for PT and OT have been arranged with [facility] accepted referral.  [facility] was unable to accept. No other needs identified.     Final next level of care: [place] Barriers to Discharge: Barriers Resolved   Patient Goals and CMS Choice Patient states their goals for this hospitalization and ongoing recovery are:: Patient wants to go home with home health services CMS Medicare.gov Compare Post Acute Care list provided to:: Patient Represenative (must comment) Choice offered to / list presented to : Patient, Adult Children  Discharge Placement                       Discharge Plan and Services     Post Acute Care Choice: [place]          DME Arranged: N/A DME Agency: NA       HH Arranged: PT, OT [place] Agency: [place] Date [facility] Agency Contacted: 09/17/21 Time [place]: 1528 Representative spoke with at [place]: [place] ([place]) Interventions     Readmission Risk Interventions No flowsheet data found.

## 2021-09-17 NOTE — Discharge Summary (Addendum)
Physician Discharge Summary   Samantha Dawson  female DOB: May 17, 1929  MEB:[id number]  PCP: Steele Sizer, MD  Admit date: 09/09/2021 Discharge date: 09/17/2021  Admitted From: home Disposition:  home.  On the day of discharge, pt declined SNF.  Daughter updated at the bedside prior to discharge.  Home Health: Yes CODE STATUS: DNR  Discharge Instructions     AMB Referral to Cardiac Rehabilitation - Phase II   Complete by: As directed    Diagnosis:  NSTEMI Coronary Stents     After initial evaluation and assessments completed: Virtual Based Care may be provided alone or in conjunction with Phase 2 Cardiac Rehab based on patient barriers.: Yes   Discharge instructions   Complete by: As directed    You had a small heart attack and received a stent.  Please be sure to take aspirin 81 mg with Brilinta together for 1 year, end date 09/17/2022.  You have congestive heart failure and received IV diuresis and have improved.  Currently your blood pressure is low, so you are not discharged on any fluid pills or blood pressure meds.  Please follow up with outpatient cardiology for further management.  Please follow up with outpatient oncology for the nodule in your lung.   Dr. Enzo Bi [place] Hospital Course:  For full details, please see H&P, progress notes, consult notes and ancillary notes.  Briefly,  Samantha Dawson is an 87 year old female with hx of CAD, combined heart failure, hypertension, prior tobacco use, COPD on 2L at baseline, who was admitted for presumed combined heart failure exacerbation, complicated by NSTEMI.  s/p catheterization.   # Acute on chronic Hypoxic Respiratory Failure 2/2  Acute on chronic combined CHF exacerbation --88% on 2L, needed up to 5L O2 --received intermittent IV lasix when BP allowed -- CXR concerning for multilobar bilateral bronchopneumonia, however, pt doesn't have any fever or infectious symptoms, procal remained neg, suspect  this represents HF.  Abx was not started. --sating well on baseline 2L after diuresis.  # NSTEMI s/p sent CAD -- troponin peaked at 7376  -- LHC with severe 2 vessel CAD with chronic occlusion of proxima/mid L circumflex with left to left collaterals and severe 99% stenosis in proximal LAD which is likely culprit for NSTEMI -> s/p angioplastic and DES to proximal LAD (25% residual stenosis)  --cont ASA and Brilinta, need at least 12 months --cont ASA, statin   # Acute kidney injury --baseline ~0.8.  presented Cr 1.14, peaked at 1.42,  in setting of HF, diuresis, and contrast --renal US negative for hydro.   # Hyperlipidemia --cont statin   # COPD on chronic 2L at baseline --cont home bronchodilator   # Hypokalemia --monitored and repleted PRN   # 1.5 cm right upper lobe nodule with irregular or spiculated margin --incidental finding on CT chest --oncology consulted --can't hold DAPT now for biopsy given recent stent --follow up with oncology outpatient --plan for outpatient PET scan   Discharge Diagnoses:  Principal Problem:   NSTEMI (non-ST elevated myocardial infarction) ([place]) Active Problems:   Actinic keratosis   Hyperlipidemia   Essential hypertension   Panlobular emphysema (HCC)   Hx of smoking   Primary osteoarthritis of both hands   Acid reflux   Senile purpura (HCC)   Nocturnal hypoxemia due to emphysema (HCC)   Acute systolic CHF (congestive heart failure) (HCC)   Elevated troponin   Lung mass   30 Day Unplanned  Readmission Risk Score    Flowsheet Row ED to Hosp-Admission (Current) from 09/09/2021 in [facility]  30 Day Unplanned Readmission Risk Score (%) 25.89 Filed at 09/17/2021 1200       This score is the patient's risk of an unplanned readmission within 30 days of being discharged (0 -100%). The score is based on dignosis, age, lab data, medications, orders, and past utilization.   Low:  0-14.9   Medium: 15-21.9   High:  22-29.9   Extreme: 30 and above         Discharge Instructions:  Allergies as of 09/17/2021       Reactions   Alendronate Nausea Only        Medication List     STOP taking these medications    isosorbide mononitrate 30 MG 24 hr tablet Commonly known as: IMDUR   metoprolol tartrate 25 MG tablet Commonly known as: LOPRESSOR   naloxone 4 MG/0.1ML Liqd nasal spray kit Commonly known as: NARCAN   nitroGLYCERIN 0.4 MG SL tablet Commonly known as: NITROSTAT   ranolazine 500 MG 12 hr tablet Commonly known as: RANEXA       TAKE these medications    albuterol 108 (90 Base) MCG/ACT inhaler Commonly known as: ProAir HFA INHALE 2 PUFFS INTO THE LUNGS EVERY 4 HOURS AS NEEDED FOR WHEEZING OR SHORTNESS OF BREATH; FUTURE REFILLS FROM LUNG DOCTOR   aspirin 81 MG tablet Take 81 mg by mouth daily.   HYDROcodone-acetaminophen 5-325 MG tablet Commonly known as: NORCO/VICODIN Take 1 tablet by mouth 3 (three) times daily as needed for moderate pain. To last one month   HYDROcodone-acetaminophen 5-325 MG tablet Commonly known as: NORCO/VICODIN Take 1 tablet by mouth 3 (three) times daily as needed for moderate pain. To last one month Start taking on: October 07, 2021   HYDROcodone-acetaminophen 5-325 MG tablet Commonly known as: NORCO/VICODIN Take 1 tablet by mouth 3 (three) times daily as needed for moderate pain. Fill September 07/31/2021 Start taking on: November 06, 2021   pantoprazole 20 MG tablet Commonly known as: Protonix Take 1 tablet (20 mg total) by mouth 2 (two) times daily. Caution:prolonged use may increase risk of pneumonia, colitis, osteoporosis, anemia.  Skip doses if you can   rosuvastatin 20 MG tablet Commonly known as: CRESTOR Take 20 mg by mouth daily.   ticagrelor 90 MG Tabs tablet Commonly known as: BRILINTA Take 1 tablet (90 mg total) by mouth 2 (two) times daily.   umeclidinium-vilanterol 62.5-25 MCG/ACT Aepb Commonly known as: ANORO  ELLIPTA Inhale 1 puff into the lungs daily at 6 (six) AM.         Contact information for follow-up providers     Steele Sizer, MD Follow up in 1 week(s).   Specialty: Family Medicine Contact information: [address] [place] [address] [phone]         Dionisio David, MD Follow up on 09/22/2021.   Specialty: Cardiology Why: at 1:15 Contact information: [place] [address] [phone]         Earlie Server, MD Follow up.   Specialty: Oncology Contact information: [place] [address] [phone]              Contact information for after-discharge care     Destination     [facility] .   Service: Skilled Chiropodist information: [address] [place] [phone]  Allergies  Allergen Reactions   Alendronate Nausea Only     The results of significant diagnostics from this hospitalization (including imaging, microbiology, ancillary and laboratory) are listed below for reference.   Consultations:   Procedures/Studies: DG Chest 1 View  Result Date: 09/09/2021 CLINICAL DATA:  Increasing shortness of breath, coronary artery disease, recent MI 3 weeks ago, pedal edema, COPD, hypertension EXAM: CHEST  1 VIEW COMPARISON:  Portable exam 1429 hours compared 08/22/2021 FINDINGS: Enlargement of cardiac silhouette with pulmonary vascular congestion. Atherosclerotic calcification aorta. Interstitial infiltrates in both lungs greatest at bases with coexistence bibasilar effusions and atelectasis. Findings favor CHF. Suspect underlying emphysematous changes. No pneumothorax. Bones demineralized. IMPRESSION: Enlargement of cardiac silhouette with pulmonary vascular congestion and probable pulmonary edema/CHF. Small bibasilar pleural effusions and atelectasis. Underlying emphysematous changes. Aortic  Atherosclerosis (ICD10-I70.0) and Emphysema (ICD10-J43.9). Electronically Signed   By: Lavonia Dana M.D.   On: 09/09/2021 14:44   DG Chest 2 View  Result Date: 08/18/2021 CLINICAL DATA:  Chest pain and shortness of breath. EXAM: CHEST - 2 VIEW COMPARISON:  October 13, 2016 FINDINGS: Diffuse, chronic appearing increased interstitial lung markings are noted. Mild to moderate severity areas of atelectasis are seen within the bilateral lung bases. There is no evidence of a pleural effusion or pneumothorax. The heart size and mediastinal contours are within normal limits. Multilevel degenerative changes seen throughout the thoracic spine. IMPRESSION: COPD with mild to moderate severity bibasilar atelectasis. Electronically Signed   By: Virgina Norfolk M.D.   On: 08/18/2021 17:32   CT CHEST WO CONTRAST  Result Date: 09/13/2021 CLINICAL DATA:  Chest pain and shortness of breath. EXAM: CT CHEST WITHOUT CONTRAST TECHNIQUE: Multidetector CT imaging of the chest was performed following the standard protocol without IV contrast. COMPARISON:  Chest radiograph dated 09/13/2021. FINDINGS: Evaluation of this exam is limited in the absence of intravenous contrast. Cardiovascular: Were top-normal cardiac size. No pericardial effusion. Advanced 3 vessel coronary vascular calcification and calcification of the mitral annulus. Four there is advanced atherosclerotic calcification of the thoracic aorta. The central pulmonary arteries are grossly unremarkable. Mediastinum/Nodes: No hilar or mediastinal adenopathy. The esophagus is grossly unremarkable. A 1.3 cm left thyroid hypodense lesion. In the setting of significant comorbidities or limited life expectancy, no follow-up recommended (ref: J Am Coll Radiol. 2015 Feb;12(2): 143-50).No mediastinal fluid collection. No focal or Lungs/Pleura: Small bilateral pleural effusions, left greater right. There is partial compressive atelectasis of the lower lobes versus pneumonia.  Background of severe emphysema. Forty pole pole there is no pneumothorax. The central airways are patent. There is a 1.5 cm nodule in the right upper lobe with irregular or spiculated margin. Multidisciplinary consult and further evaluation with PET-CT if clinically indicated, recommended. Upper Abdomen: Partially visualized 2.5 cm left renal cyst. Musculoskeletal: Osteopenia with degenerative changes of the spine. No acute osseous pathology. IMPRESSION: 1. Small bilateral pleural effusions, left greater right with partial compressive atelectasis of the lower lobes versus pneumonia. 2. A 1.5 cm right upper lobe nodule with irregular or spiculated margin. This is concerning for malignancy. Multidisciplinary consult and further evaluation with PET-CT, if clinically indicated, recommended. 3. Aortic Atherosclerosis (ICD10-I70.0) and Emphysema (ICD10-J43.9). Electronically Signed   By: Anner Crete M.D.   On: 09/13/2021 22:58   CARDIAC CATHETERIZATION  Result Date: 09/13/2021   Ost LM lesion is 30% stenosed.   Prox Cx to Mid Cx lesion is 100% stenosed.   Prox LAD to Mid LAD lesion is 99% stenosed.   Mid LAD to [facility]  LAD lesion is 30% stenosed.   A drug-eluting stent was successfully placed using a STENT ONYX FRONTIER [id number].   Post intervention, there is a 25% residual stenosis. 1.  Severe two-vessel coronary artery disease with chronic occlusion of proximal/mid left circumflex with left to left collaterals and severe 99% stenosis in the proximal LAD which is the likely culprit for non-ST elevation myocardial infarction. 2.  Left ventricular angiography was not performed due to underlying chronic kidney disease. 3.  Severely elevated left ventricular end-diastolic pressure at 31 mmHg. 4.  Successful angioplasty and drug-eluting stent placement to the proximal LAD.  Suboptimal results due to inability to fully expand the stent proximally due to heavy calcifications.  The procedure was limited by a 5 [place] guide  system given lack of alternative vascular access.  A small first diagonal was jailed by the stent with TIMI I flow but the vessel was too small to rescue. Recommendations: Dual antiplatelet therapy for at least 12 months. Will give 1 dose of IV furosemide given shortness of breath and significantly elevated left ventricular end-diastolic pressure. Aggressive treatment of risk factors. Please note that the case was attempted initially from the right femoral access by Dr. Humphrey Rolls but was not able to advance the wire more than few centimeters and thus femoral access was aborted and I elected for right radial artery access in spite of small right radial artery size by ultrasound.   US RENAL  Result Date: 09/11/2021 CLINICAL DATA:  Acute renal insufficiency. EXAM: RENAL / URINARY TRACT ULTRASOUND COMPLETE COMPARISON:  None. FINDINGS: Right Kidney: Renal measurements: 9.3 x 4.2 x 4.6 cm = volume: 95 mL. Echogenicity within normal limits. No hydronephrosis visualized. There is a simple cyst exophytic off of the lower right renal cortex measuring 1.8 cm in greatest dimension. Left Kidney: Renal measurements: 7.9 x 3.3 x 3.6 = volume: 48 mL. Echogenicity within normal limits. No hydronephrosis visualized. Two benign cysts seen. There is a 2.7 cm cyst in the midpole region of the left kidney and a 1.7 cm cyst, exophytic off of the lower pole of the left kidney. Bladder: Distended with slightly lobulated contour. Other: Bilateral pleural effusions. Heterogeneous echogenicity of the liver, partially visualized. IMPRESSION: Increased renal echogenicity, consistent with medical renal disease. Bilateral renal cysts. Heterogeneous echogenicity of the liver, partially visualized. Evaluation with right upper quadrant ultrasound or cross-sectional imaging may be considered. Bilateral pleural effusions. Electronically Signed   By: Fidela Salisbury M.D.   On: 09/11/2021 10:29   DG Chest Port 1 View  Result Date:  09/13/2021 CLINICAL DATA:  87 year old female with history of shortness of breath. EXAM: PORTABLE CHEST 1 VIEW COMPARISON:  Chest x-ray 09/11/2021. FINDINGS: Diffuse peribronchial cuffing, widespread interstitial prominence and patchy multifocal ill-defined airspace disease scattered throughout the lungs bilaterally, similar to the recent prior examination. Small bilateral pleural effusions. No pneumothorax. Heart size is normal. Upper mediastinal contours are within normal limits. Atherosclerotic calcifications in the thoracic aorta. IMPRESSION: 1. The appearance the chest is most compatible with multilobar bilateral bronchopneumonia. Aeration appears slightly worsened compared to the recent prior study. 2. Small bilateral pleural effusions. 3. Aortic atherosclerosis. Electronically Signed   By: Vinnie Langton M.D.   On: 09/13/2021 09:01   DG Chest Port 1 View  Result Date: 09/11/2021 CLINICAL DATA:  Wheezing and dyspnea, COPD EXAM: PORTABLE CHEST 1 VIEW COMPARISON:  09/09/2021 chest radiograph. FINDINGS: Stable cardiomediastinal silhouette with normal heart size. No pneumothorax. Small bilateral pleural effusions, stable. New faint hazy opacities in the  upper lungs bilaterally, right greater than left. Streaky and reticular bibasilar lung opacities, stable. IMPRESSION: 1. New faint hazy opacities in the upper lungs bilaterally, right greater than left, which could represent pneumonia or mild pulmonary edema. 2. Stable small bilateral pleural effusions. 3. Stable streaky and reticular bibasilar lung opacities, favor nonspecific scarring and/or atelectasis. Electronically Signed   By: Ilona Sorrel M.D.   On: 09/11/2021 08:01   DG Chest Port 1 View  Result Date: 08/22/2021 CLINICAL DATA:  COPD. EXAM: PORTABLE CHEST 1 VIEW COMPARISON:  Chest radiograph, 08/18/2021 and 10/13/2016. FINDINGS: Cardiac silhouette is within normal limits. Aortic vascular calcifications. Pulmonary hyperinflation with coarse  bibasilar opacities no discrete consolidation. No large pleural effusion or pneumothorax. No acute osseous abnormality. IMPRESSION: 1. Obstructive pulmonary disease with increased prominence of coarse bibasilar opacities. Findings may reflect atelectasis though early superimposed pneumonia can appear similar. 2.  Aortic Atherosclerosis (ICD10-I70.0). Electronically Signed   By: Michaelle Birks M.D.   On: 08/22/2021 07:52   ECHOCARDIOGRAM COMPLETE  Result Date: 08/21/2021    ECHOCARDIOGRAM REPORT   Patient Name:   VERALYN LOPP [facility] Date of Exam: 08/19/2021 Medical Rec #:  [id number]       Height:       60.0 in Accession #:    [id number]      Weight:       107.6 lb Date of Birth:  [birth year]       BSA:          1.434 m Patient Age:    77 years        BP:           107/47 mmHg Patient Gender: F               HR:           81 bpm. Exam Location:  [facility] Procedure: 2D Echo, Color Doppler and Cardiac Doppler Indications:     I21.4 NSTEMI  History:         Patient has prior history of Echocardiogram examinations. Risk                  Factors:Former Smoker, Hypertension and Dyslipidemia.  Sonographer:     Charmayne Sheer Referring Phys:  [id number] Lequita Halt Diagnosing Phys: Neoma Laming  Sonographer Comments: Image acquisition challenging due to uncooperative patient. IMPRESSIONS  1. Left ventricular ejection fraction, by estimation, is 35 to 40%. The left ventricle has moderately decreased function. The left ventricle demonstrates global hypokinesis. There is severe asymmetric left ventricular hypertrophy of the basal-septal segment. Left ventricular diastolic parameters are consistent with Grade III diastolic dysfunction (restrictive).  2. Right ventricular systolic function is normal. The right ventricular size is normal.  3. Left atrial size was moderately dilated.  4. Right atrial size was moderately dilated.  5. The mitral valve is degenerative. Trivial mitral valve regurgitation. Mild mitral stenosis. Severe mitral annular  calcification.  6. The aortic valve is calcified. Aortic valve regurgitation is trivial. Mild to moderate aortic valve sclerosis/calcification is present, without any evidence of aortic stenosis.  7. The inferior vena cava is normal in size with greater than 50% respiratory variability, suggesting right atrial pressure of 3 mmHg. FINDINGS  Left Ventricle: Left ventricular ejection fraction, by estimation, is 35 to 40%. The left ventricle has moderately decreased function. The left ventricle demonstrates global hypokinesis. The left ventricular internal cavity size was normal in size. There is severe asymmetric left ventricular hypertrophy of the basal-septal segment. Left ventricular  diastolic parameters are consistent with Grade III diastolic dysfunction (restrictive). Right Ventricle: The right ventricular size is normal. No increase in right ventricular wall thickness. Right ventricular systolic function is normal. Left Atrium: Left atrial size was moderately dilated. Right Atrium: Right atrial size was moderately dilated. Pericardium: There is no evidence of pericardial effusion. Mitral Valve: The mitral valve is degenerative in appearance. There is severe calcification of the mitral valve leaflet(s). Mildly decreased mobility of the mitral valve leaflets. Severe mitral annular calcification. Trivial mitral valve regurgitation. Mild mitral valve stenosis. MV peak gradient, 5.6 mmHg. The mean mitral valve gradient is 3.0 mmHg. Tricuspid Valve: The tricuspid valve is normal in structure. Tricuspid valve regurgitation is mild . No evidence of tricuspid stenosis. Aortic Valve: The aortic valve is calcified. Aortic valve regurgitation is trivial. Mild to moderate aortic valve sclerosis/calcification is present, without any evidence of aortic stenosis. Aortic valve mean gradient measures 12.0 mmHg. Aortic valve peak gradient measures 21.2 mmHg. Aortic valve area, by VTI measures 1.65 cm. Pulmonic Valve: The pulmonic  valve was normal in structure. Pulmonic valve regurgitation is not visualized. No evidence of pulmonic stenosis. Aorta: The aortic root is normal in size and structure. Venous: The inferior vena cava is normal in size with greater than 50% respiratory variability, suggesting right atrial pressure of 3 mmHg. IAS/Shunts: No atrial level shunt detected by color flow Doppler.  LEFT VENTRICLE PLAX 2D LVIDd:         2.70 cm     Diastology LVIDs:         2.50 cm     LV e' medial:    3.37 cm/s LV PW:         1.30 cm     LV E/e' medial:  30.3 LV IVS:        1.30 cm     LV e' lateral:   4.46 cm/s LVOT diam:     2.20 cm     LV E/e' lateral: 22.9 LV SV:         60 LV SV Index:   42 LVOT Area:     3.80 cm  LV Volumes (MOD) LV vol d, MOD A2C: 43.5 ml LV vol d, MOD A4C: 21.8 ml LV vol s, MOD A2C: 23.6 ml LV vol s, MOD A4C: 17.5 ml LV SV MOD A2C:     19.9 ml LV SV MOD A4C:     21.8 ml LV SV MOD BP:      12.2 ml RIGHT VENTRICLE RV Basal diam:  2.80 cm LEFT ATRIUM             Index        RIGHT ATRIUM          Index LA diam:        3.50 cm 2.44 cm/m   RA Area:     6.86 cm LA Vol (A2C):   30.8 ml 21.48 ml/m  RA Volume:   11.90 ml 8.30 ml/m LA Vol (A4C):   39.6 ml 27.61 ml/m LA Biplane Vol: 35.8 ml 24.96 ml/m  AORTIC VALVE                     PULMONIC VALVE AV Area (Vmax):    1.37 cm      PV Vmax:       0.89 m/s AV Area (Vmean):   1.38 cm      PV Vmean:      67.700 cm/s AV Area (VTI):  1.65 cm      PV VTI:        0.169 m AV Vmax:           230.00 cm/s   PV Peak grad:  3.2 mmHg AV Vmean:          165.000 cm/s  PV Mean grad:  2.0 mmHg AV VTI:            0.364 m AV Peak Grad:      21.2 mmHg AV Mean Grad:      12.0 mmHg LVOT Vmax:         82.90 cm/s LVOT Vmean:        59.800 cm/s LVOT VTI:          0.158 m LVOT/AV VTI ratio: 0.43  AORTA Ao Root diam: 2.90 cm MITRAL VALVE MV Area (PHT): 3.77 cm     SHUNTS MV Area VTI:   1.73 cm     Systemic VTI:  0.16 m MV Peak grad:  5.6 mmHg     Systemic Diam: 2.20 cm MV Mean grad:  3.0  mmHg MV Vmax:       1.18 m/s MV Vmean:      81.4 cm/s MV Decel Time: 201 msec MV E velocity: 102.00 cm/s MV A velocity: 116.00 cm/s MV E/A ratio:  0.88 Shaukat Khan Electronically signed by Neoma Laming Signature Date/Time: 08/21/2021/3:12:23 PM    Final       Labs: BNP (last 3 results) Recent Labs    09/09/21 1402 09/13/21 1836 09/14/21 0510  BNP 1,905.6* 1,844.0* 1,443.1*   Basic Metabolic Panel: Recent Labs  Lab 09/11/21 0532 09/12/21 0457 09/13/21 0555 09/14/21 0510 09/15/21 0526 09/16/21 0623 09/17/21 0329  NA 140 140 141 141 138 138 141  K 3.5 3.0* 4.5 3.8 3.4* 3.9 3.8  CL 102 99 106 105 103 103 103  CO2 27 32 _0 GLUCOSE 152* 117* 121* 112* 109* 111* 103*  BUN 25* 27* 24* 25* 25* 24* 25*  CREATININE 1.33* 1.42* 1.32* 1.28* 1.33* 1.19* 1.16*  CALCIUM 9.2 9.1 8.9 8.9 8.5* 8.9 9.1  MG 2.2 2.2 2.3 2.5* 2.2 2.3 2.4  PHOS 2.3* 3.4 2.6 2.9  --   --   --    Liver Function Tests: Recent Labs  Lab 09/11/21 0532 09/12/21 0457 09/13/21 0555 09/14/21 0510  AST 36 67* 66* 44*  ALT _1 ALKPHOS 143* 129* 123 119  BILITOT 1.9* 1.7* 1.9* 1.6*  PROT 7.7 7.1 7.0 7.0  ALBUMIN 3.6 3.4* 3.3* 3.3*   No results for input(s): LIPASE, AMYLASE in the last 168 hours. No results for input(s): AMMONIA in the last 168 hours. CBC: Recent Labs  Lab 09/11/21 0532 09/12/21 0457 09/13/21 0555 09/14/21 0510  WBC 9.4 8.4 9.0 11.8*  NEUTROABS 6.1 5.3 6.4 8.9*  HGB 13.3 12.0 12.0 11.2*  HCT 38.7 35.9* 36.6 34.2*  MCV 97.0 97.8 99.5 100.0  PLT 272 243 205 192   Cardiac Enzymes: No results for input(s): CKTOTAL, CKMB, CKMBINDEX, TROPONINI in the last 168 hours. BNP: Invalid input(s): POCBNP CBG: Recent Labs  Lab 09/12/21 2059  GLUCAP 117*   D-Dimer No results for input(s): DDIMER in the last 72 hours. Hgb A1c No results for input(s): HGBA1C in the last 72 hours. Lipid Profile No results for input(s): CHOL, HDL, LDLCALC, TRIG, CHOLHDL, LDLDIRECT in the  last 72 hours. Thyroid function studies No results for input(s): TSH, T4TOTAL, T3FREE,  THYROIDAB in the last 72 hours.  Invalid input(s): FREET3 Anemia work up No results for input(s): VITAMINB12, FOLATE, FERRITIN, TIBC, IRON, RETICCTPCT in the last 72 hours. Urinalysis    Component Value Date/Time   COLORURINE YELLOW (A) 09/09/2021 1851   APPEARANCEUR CLEAR (A) 09/09/2021 1851   LABSPEC 1.013 09/09/2021 1851   PHURINE 6.0 09/09/2021 1851   GLUCOSEU NEGATIVE 09/09/2021 1851   HGBUR SMALL (A) 09/09/2021 1851   BILIRUBINUR NEGATIVE 09/09/2021 1851   KETONESUR NEGATIVE 09/09/2021 1851   PROTEINUR 100 (A) 09/09/2021 1851   NITRITE NEGATIVE 09/09/2021 1851   LEUKOCYTESUR NEGATIVE 09/09/2021 1851   Sepsis Labs Invalid input(s): PROCALCITONIN,  WBC,  LACTICIDVEN Microbiology Recent Results (from the past 240 hour(s))  Resp Panel by RT-PCR (Flu A&B, Covid) Nasopharyngeal Swab     Status: None   Collection Time: 09/09/21  6:51 PM   Specimen: Nasopharyngeal Swab; Nasopharyngeal(NP) swabs in vial transport medium  Result Value Ref Range Status   SARS Coronavirus 2 by RT PCR NEGATIVE NEGATIVE Final    Comment: (NOTE) SARS-CoV-2 target nucleic acids are NOT DETECTED.  The SARS-CoV-2 RNA is generally detectable in upper respiratory specimens during the acute phase of infection. The lowest concentration of SARS-CoV-2 viral copies this assay can detect is 138 copies/mL. A negative result does not preclude SARS-Cov-2 infection and should not be used as the sole basis for treatment or other patient management decisions. A negative result may occur with  improper specimen collection/handling, submission of specimen other than nasopharyngeal swab, presence of viral mutation(s) within the areas targeted by this assay, and inadequate number of viral copies(<138 copies/mL). A negative result must be combined with clinical observations, patient history, and epidemiological information. The  expected result is Negative.  Fact Sheet for Patients:  EntrepreneurPulse.com.au  Fact Sheet for Healthcare Providers:  IncredibleEmployment.be  This test is no t yet approved or cleared by the [place] FDA and  has been authorized for detection and/or diagnosis of SARS-CoV-2 by FDA under an Emergency Use Authorization (EUA). This EUA will remain  in effect (meaning this test can be used) for the duration of the COVID-19 declaration under Section 564(b)(1) of the Act, 21 U.S.C.section 360bbb-3(b)(1), unless the authorization is terminated  or revoked sooner.       Influenza A by PCR NEGATIVE NEGATIVE Final   Influenza B by PCR NEGATIVE NEGATIVE Final    Comment: (NOTE) The Xpert Xpress SARS-CoV-2/FLU/RSV plus assay is intended as an aid in the diagnosis of influenza from Nasopharyngeal swab specimens and should not be used as a sole basis for treatment. Nasal washings and aspirates are unacceptable for Xpert Xpress SARS-CoV-2/FLU/RSV testing.  Fact Sheet for Patients: EntrepreneurPulse.com.au  Fact Sheet for Healthcare Providers: IncredibleEmployment.be  This test is not yet approved or cleared by the [place] FDA and has been authorized for detection and/or diagnosis of SARS-CoV-2 by FDA under an Emergency Use Authorization (EUA). This EUA will remain in effect (meaning this test can be used) for the duration of the COVID-19 declaration under Section 564(b)(1) of the Act, 21 U.S.C. section 360bbb-3(b)(1), unless the authorization is terminated or revoked.  Performed at [facility], [address] [place], [place] [address]   Resp Panel by RT-PCR (Flu A&B, Covid) Nasopharyngeal Swab     Status: None   Collection Time: 09/16/21  1:52 PM   Specimen: Nasopharyngeal Swab; Nasopharyngeal(NP) swabs in vial transport medium  Result Value Ref Range Status   SARS Coronavirus 2 by RT PCR  NEGATIVE  NEGATIVE Final    Comment: (NOTE) SARS-CoV-2 target nucleic acids are NOT DETECTED.  The SARS-CoV-2 RNA is generally detectable in upper respiratory specimens during the acute phase of infection. The lowest concentration of SARS-CoV-2 viral copies this assay can detect is 138 copies/mL. A negative result does not preclude SARS-Cov-2 infection and should not be used as the sole basis for treatment or other patient management decisions. A negative result may occur with  improper specimen collection/handling, submission of specimen other than nasopharyngeal swab, presence of viral mutation(s) within the areas targeted by this assay, and inadequate number of viral copies(<138 copies/mL). A negative result must be combined with clinical observations, patient history, and epidemiological information. The expected result is Negative.  Fact Sheet for Patients:  EntrepreneurPulse.com.au  Fact Sheet for Healthcare Providers:  IncredibleEmployment.be  This test is no t yet approved or cleared by the [place] FDA and  has been authorized for detection and/or diagnosis of SARS-CoV-2 by FDA under an Emergency Use Authorization (EUA). This EUA will remain  in effect (meaning this test can be used) for the duration of the COVID-19 declaration under Section 564(b)(1) of the Act, 21 U.S.C.section 360bbb-3(b)(1), unless the authorization is terminated  or revoked sooner.       Influenza A by PCR NEGATIVE NEGATIVE Final   Influenza B by PCR NEGATIVE NEGATIVE Final    Comment: (NOTE) The Xpert Xpress SARS-CoV-2/FLU/RSV plus assay is intended as an aid in the diagnosis of influenza from Nasopharyngeal swab specimens and should not be used as a sole basis for treatment. Nasal washings and aspirates are unacceptable for Xpert Xpress SARS-CoV-2/FLU/RSV testing.  Fact Sheet for Patients: EntrepreneurPulse.com.au  Fact Sheet for  Healthcare Providers: IncredibleEmployment.be  This test is not yet approved or cleared by the [place] FDA and has been authorized for detection and/or diagnosis of SARS-CoV-2 by FDA under an Emergency Use Authorization (EUA). This EUA will remain in effect (meaning this test can be used) for the duration of the COVID-19 declaration under Section 564(b)(1) of the Act, 21 U.S.C. section 360bbb-3(b)(1), unless the authorization is terminated or revoked.  Performed at [facility], [address] [place], [place] [address]      Total time spend on discharging this patient, including the last patient exam, discussing the hospital stay, instructions for ongoing care as it relates to all pertinent caregivers, as well as preparing the medical discharge records, prescriptions, and/or referrals as applicable, is 40 minutes.    Enzo Bi, MD  [facility] 09/17/2021, 1:52 PM

## 2021-09-17 NOTE — TOC Progression Note (Addendum)
Transition of Care [facility]) - Progression Note    Patient Details  Name: Samantha Dawson MRN: [id number] Date of Birth: 10/10/1929  Transition of Care [facility]) CM/SW [place], RN Phone Number: 09/17/2021, 2:20 PM  Clinical Narrative:   Patient declined SNF at this time, would like home health.  Patient lives alone, daughter at bedside, stating they are declining SNF.  Patient ambulated in the unit, states she needs a walker and is at her baseline.  NO concerns about getting to appointments.  Jason from [employer], will call back with confirmation.    Addendum:  Patient's family state they live nearby and they can assist her with care as needed.  Expected Discharge Plan: [place] Barriers to Discharge: Continued Medical Work up  Expected Discharge Plan and Services Expected Discharge Plan: [place] Choice: [place] arrangements for the past 2 months: Single Family Home Expected Discharge Date: 09/17/21                                     Social Determinants of Health (SDOH) Interventions    Readmission Risk Interventions No flowsheet data found.

## 2021-09-17 NOTE — Progress Notes (Signed)
Patient had 9 beats of SVT earlier this shift and she was asymptomatic. Randol Kern NP notified without any new orders. Will continue to monitor.

## 2021-09-17 NOTE — Progress Notes (Signed)
Samantha Dawson to be D/C'd home with home health per MD order. Discussed with the patient and daughter and all questions fully answered.  Skin clean, dry and intact without evidence of skin break down, no evidence of skin tears noted.  IV catheter discontinued intact. Site without signs and symptoms of complications. Dressing and pressure applied.  An After Visit Summary was printed and given to the patient.  Patient escorted via [place], and D/C home via private auto.  Melonie [place]  09/17/2021 4:14 PM

## 2021-09-19 ENCOUNTER — Encounter: Payer: Self-pay | Admitting: *Deleted

## 2021-09-19 ENCOUNTER — Telehealth: Payer: Self-pay

## 2021-09-19 DIAGNOSIS — R911 Solitary pulmonary nodule: Secondary | ICD-10-CM

## 2021-09-19 DIAGNOSIS — I272 Pulmonary hypertension, unspecified: Secondary | ICD-10-CM | POA: Diagnosis not present

## 2021-09-19 NOTE — Telephone Encounter (Signed)
Transition Care Management Follow-up Telephone Call Date of discharge and from where: 09/17/21 [facility] How have you been since you were released from the hospital? Pt states she is doing okay Any questions or concerns? No  Items Reviewed: Did the pt receive and understand the discharge instructions provided? Yes  Medications obtained and verified? Yes  Other? No  Any new allergies since your discharge? No  Dietary orders reviewed? Yes Do you have support at home? Yes   Home Care and Equipment/Supplies: Were home health services ordered? yes If so, what is the name of the agency? [facility]   Has the agency set up a time to come to the patient's home? no Were any new equipment or medical supplies ordered?  No  Functional Questionnaire: (I = Independent and D = Dependent) ADLs: I with assistance  Bathing/Dressing- I with assistance  Meal Prep- D  Eating- I  Maintaining continence- I  Transferring/Ambulation- I with walker  Managing Meds- I  Follow up appointments reviewed:  PCP Hospital f/u appt confirmed? No  Pt needs to check with daughter for schedule availability. [facility] f/u appt confirmed? Yes  Scheduled to see Dr. Chancy Milroy on 09/20/21 & oncology on 10/05/21. Are transportation arrangements needed? No  If their condition worsens, is the pt aware to call PCP or go to the Emergency Dept.? Yes Was the patient provided with contact information for the PCP's office or ED? Yes Was to pt encouraged to call back with questions or concerns? Yes

## 2021-09-20 DIAGNOSIS — R0602 Shortness of breath: Secondary | ICD-10-CM | POA: Diagnosis not present

## 2021-09-20 DIAGNOSIS — I739 Peripheral vascular disease, unspecified: Secondary | ICD-10-CM | POA: Diagnosis not present

## 2021-09-20 DIAGNOSIS — I10 Essential (primary) hypertension: Secondary | ICD-10-CM | POA: Diagnosis not present

## 2021-09-20 DIAGNOSIS — I2510 Atherosclerotic heart disease of native coronary artery without angina pectoris: Secondary | ICD-10-CM | POA: Diagnosis not present

## 2021-09-20 DIAGNOSIS — E782 Mixed hyperlipidemia: Secondary | ICD-10-CM | POA: Diagnosis not present

## 2021-09-20 DIAGNOSIS — Z9861 Coronary angioplasty status: Secondary | ICD-10-CM | POA: Diagnosis not present

## 2021-09-20 NOTE — Telephone Encounter (Signed)
Lvm for pt to call and schedule an appt for HFU.

## 2021-09-20 NOTE — Progress Notes (Signed)
Spoke with pt to review upcoming appts. Pt stated that she would like for me to call her daughter, [name]. Phone call made to [facility] to review upcoming appts and introduce to navigator services. All questions answered during call. Contact info given and instructed to call with any further questions or needs. Holly verbalized understanding.

## 2021-09-23 ENCOUNTER — Telehealth: Payer: Self-pay

## 2021-09-23 ENCOUNTER — Telehealth: Payer: Self-pay | Admitting: Family Medicine

## 2021-09-23 DIAGNOSIS — H9190 Unspecified hearing loss, unspecified ear: Secondary | ICD-10-CM | POA: Diagnosis not present

## 2021-09-23 DIAGNOSIS — Z7902 Long term (current) use of antithrombotics/antiplatelets: Secondary | ICD-10-CM | POA: Diagnosis not present

## 2021-09-23 DIAGNOSIS — R918 Other nonspecific abnormal finding of lung field: Secondary | ICD-10-CM | POA: Diagnosis not present

## 2021-09-23 DIAGNOSIS — I110 Hypertensive heart disease with heart failure: Secondary | ICD-10-CM | POA: Diagnosis not present

## 2021-09-23 DIAGNOSIS — Z87891 Personal history of nicotine dependence: Secondary | ICD-10-CM | POA: Diagnosis not present

## 2021-09-23 DIAGNOSIS — I700 Atherosclerosis of aorta: Secondary | ICD-10-CM | POA: Diagnosis not present

## 2021-09-23 DIAGNOSIS — I2510 Atherosclerotic heart disease of native coronary artery without angina pectoris: Secondary | ICD-10-CM | POA: Diagnosis not present

## 2021-09-23 DIAGNOSIS — E785 Hyperlipidemia, unspecified: Secondary | ICD-10-CM | POA: Diagnosis not present

## 2021-09-23 DIAGNOSIS — M810 Age-related osteoporosis without current pathological fracture: Secondary | ICD-10-CM | POA: Diagnosis not present

## 2021-09-23 DIAGNOSIS — I5023 Acute on chronic systolic (congestive) heart failure: Secondary | ICD-10-CM | POA: Diagnosis not present

## 2021-09-23 DIAGNOSIS — D692 Other nonthrombocytopenic purpura: Secondary | ICD-10-CM | POA: Diagnosis not present

## 2021-09-23 DIAGNOSIS — Z7982 Long term (current) use of aspirin: Secondary | ICD-10-CM | POA: Diagnosis not present

## 2021-09-23 DIAGNOSIS — G8929 Other chronic pain: Secondary | ICD-10-CM | POA: Diagnosis not present

## 2021-09-23 DIAGNOSIS — J9811 Atelectasis: Secondary | ICD-10-CM | POA: Diagnosis not present

## 2021-09-23 DIAGNOSIS — H3530 Unspecified macular degeneration: Secondary | ICD-10-CM | POA: Diagnosis not present

## 2021-09-23 DIAGNOSIS — M5134 Other intervertebral disc degeneration, thoracic region: Secondary | ICD-10-CM | POA: Diagnosis not present

## 2021-09-23 DIAGNOSIS — M19041 Primary osteoarthritis, right hand: Secondary | ICD-10-CM | POA: Diagnosis not present

## 2021-09-23 DIAGNOSIS — I214 Non-ST elevation (NSTEMI) myocardial infarction: Secondary | ICD-10-CM | POA: Diagnosis not present

## 2021-09-23 DIAGNOSIS — J431 Panlobular emphysema: Secondary | ICD-10-CM | POA: Diagnosis not present

## 2021-09-23 DIAGNOSIS — K219 Gastro-esophageal reflux disease without esophagitis: Secondary | ICD-10-CM | POA: Diagnosis not present

## 2021-09-23 DIAGNOSIS — Z9981 Dependence on supplemental oxygen: Secondary | ICD-10-CM | POA: Diagnosis not present

## 2021-09-23 DIAGNOSIS — M19042 Primary osteoarthritis, left hand: Secondary | ICD-10-CM | POA: Diagnosis not present

## 2021-09-23 DIAGNOSIS — I5032 Chronic diastolic (congestive) heart failure: Secondary | ICD-10-CM | POA: Diagnosis not present

## 2021-09-23 DIAGNOSIS — Z9181 History of falling: Secondary | ICD-10-CM | POA: Diagnosis not present

## 2021-09-23 NOTE — Telephone Encounter (Unsigned)
Copied from [place] [phone]. Topic: Quick Communication - Home Health Verbal Orders >> Sep 23, 2021  1:52 PM Yvette Rack wrote: Caller/Agency: Amy with [facility] Number: [phone] Requesting OT/PT/Skilled Nursing/Social Work/Speech Therapy: PT  Frequency: 1 x 2 weeks 2 x 1 week, and 1 x 3 weeks

## 2021-09-23 NOTE — Telephone Encounter (Signed)
Copied from [place] [id number]. Topic: Quick Communication - Home Health Verbal Orders >> Sep 23, 2021  1:52 PM Yvette Rack wrote: Caller/Agency: Amy with [facility] Number: [phone] Requesting OT/PT/Skilled Nursing/Social Work/Speech Therapy: PT  Frequency: 1 x 2 weeks, 2 x 1 week, and 1 x 3 weeks

## 2021-09-23 NOTE — Telephone Encounter (Signed)
Verbal orders given  

## 2021-09-26 ENCOUNTER — Other Ambulatory Visit: Payer: Self-pay

## 2021-09-26 ENCOUNTER — Ambulatory Visit
Admission: RE | Admit: 2021-09-26 | Discharge: 2021-09-26 | Disposition: A | Discharge status: Home / Self Care | Payer: Medicare Other | Source: Ambulatory Visit | Attending: Nurse Practitioner | Admitting: Nurse Practitioner

## 2021-09-26 DIAGNOSIS — J9811 Atelectasis: Secondary | ICD-10-CM | POA: Diagnosis not present

## 2021-09-26 DIAGNOSIS — M5134 Other intervertebral disc degeneration, thoracic region: Secondary | ICD-10-CM | POA: Diagnosis not present

## 2021-09-26 DIAGNOSIS — K219 Gastro-esophageal reflux disease without esophagitis: Secondary | ICD-10-CM | POA: Diagnosis not present

## 2021-09-26 DIAGNOSIS — J439 Emphysema, unspecified: Secondary | ICD-10-CM | POA: Diagnosis not present

## 2021-09-26 DIAGNOSIS — J432 Centrilobular emphysema: Secondary | ICD-10-CM | POA: Diagnosis not present

## 2021-09-26 DIAGNOSIS — Z7982 Long term (current) use of aspirin: Secondary | ICD-10-CM | POA: Diagnosis not present

## 2021-09-26 DIAGNOSIS — H3530 Unspecified macular degeneration: Secondary | ICD-10-CM | POA: Diagnosis not present

## 2021-09-26 DIAGNOSIS — Z9181 History of falling: Secondary | ICD-10-CM | POA: Diagnosis not present

## 2021-09-26 DIAGNOSIS — I5023 Acute on chronic systolic (congestive) heart failure: Secondary | ICD-10-CM | POA: Diagnosis not present

## 2021-09-26 DIAGNOSIS — R911 Solitary pulmonary nodule: Secondary | ICD-10-CM | POA: Insufficient documentation

## 2021-09-26 DIAGNOSIS — M19041 Primary osteoarthritis, right hand: Secondary | ICD-10-CM | POA: Diagnosis not present

## 2021-09-26 DIAGNOSIS — E785 Hyperlipidemia, unspecified: Secondary | ICD-10-CM | POA: Diagnosis not present

## 2021-09-26 DIAGNOSIS — J90 Pleural effusion, not elsewhere classified: Secondary | ICD-10-CM | POA: Diagnosis not present

## 2021-09-26 DIAGNOSIS — G8929 Other chronic pain: Secondary | ICD-10-CM | POA: Diagnosis not present

## 2021-09-26 DIAGNOSIS — I214 Non-ST elevation (NSTEMI) myocardial infarction: Secondary | ICD-10-CM | POA: Diagnosis not present

## 2021-09-26 DIAGNOSIS — M19042 Primary osteoarthritis, left hand: Secondary | ICD-10-CM | POA: Diagnosis not present

## 2021-09-26 DIAGNOSIS — D692 Other nonthrombocytopenic purpura: Secondary | ICD-10-CM | POA: Diagnosis not present

## 2021-09-26 DIAGNOSIS — I110 Hypertensive heart disease with heart failure: Secondary | ICD-10-CM | POA: Diagnosis not present

## 2021-09-26 DIAGNOSIS — Z7902 Long term (current) use of antithrombotics/antiplatelets: Secondary | ICD-10-CM | POA: Diagnosis not present

## 2021-09-26 DIAGNOSIS — Z9981 Dependence on supplemental oxygen: Secondary | ICD-10-CM | POA: Diagnosis not present

## 2021-09-26 DIAGNOSIS — I700 Atherosclerosis of aorta: Secondary | ICD-10-CM | POA: Insufficient documentation

## 2021-09-26 DIAGNOSIS — J431 Panlobular emphysema: Secondary | ICD-10-CM | POA: Diagnosis not present

## 2021-09-26 DIAGNOSIS — R918 Other nonspecific abnormal finding of lung field: Secondary | ICD-10-CM | POA: Diagnosis not present

## 2021-09-26 DIAGNOSIS — I5032 Chronic diastolic (congestive) heart failure: Secondary | ICD-10-CM | POA: Diagnosis not present

## 2021-09-26 DIAGNOSIS — M810 Age-related osteoporosis without current pathological fracture: Secondary | ICD-10-CM | POA: Diagnosis not present

## 2021-09-26 DIAGNOSIS — H9190 Unspecified hearing loss, unspecified ear: Secondary | ICD-10-CM | POA: Diagnosis not present

## 2021-09-26 DIAGNOSIS — I2510 Atherosclerotic heart disease of native coronary artery without angina pectoris: Secondary | ICD-10-CM | POA: Diagnosis not present

## 2021-09-26 DIAGNOSIS — K449 Diaphragmatic hernia without obstruction or gangrene: Secondary | ICD-10-CM | POA: Diagnosis not present

## 2021-09-26 DIAGNOSIS — Z87891 Personal history of nicotine dependence: Secondary | ICD-10-CM | POA: Diagnosis not present

## 2021-09-26 LAB — GLUCOSE, CAPILLARY: Glucose-Capillary: 91 mg/dL (ref 70–99)

## 2021-09-26 MED ORDER — FLUDEOXYGLUCOSE F - 18 (FDG) INJECTION
5.2000 | Freq: Once | INTRAVENOUS | Status: AC | PRN
  Administered 2021-09-26: 5.96 via INTRAVENOUS

## 2021-09-27 ENCOUNTER — Ambulatory Visit: Payer: Medicare Other

## 2021-09-27 DIAGNOSIS — Z7982 Long term (current) use of aspirin: Secondary | ICD-10-CM | POA: Diagnosis not present

## 2021-09-27 DIAGNOSIS — H9190 Unspecified hearing loss, unspecified ear: Secondary | ICD-10-CM | POA: Diagnosis not present

## 2021-09-27 DIAGNOSIS — Z7902 Long term (current) use of antithrombotics/antiplatelets: Secondary | ICD-10-CM | POA: Diagnosis not present

## 2021-09-27 DIAGNOSIS — G8929 Other chronic pain: Secondary | ICD-10-CM | POA: Diagnosis not present

## 2021-09-27 DIAGNOSIS — Z9181 History of falling: Secondary | ICD-10-CM | POA: Diagnosis not present

## 2021-09-27 DIAGNOSIS — J431 Panlobular emphysema: Secondary | ICD-10-CM | POA: Diagnosis not present

## 2021-09-27 DIAGNOSIS — I214 Non-ST elevation (NSTEMI) myocardial infarction: Secondary | ICD-10-CM | POA: Diagnosis not present

## 2021-09-27 DIAGNOSIS — K219 Gastro-esophageal reflux disease without esophagitis: Secondary | ICD-10-CM | POA: Diagnosis not present

## 2021-09-27 DIAGNOSIS — J9811 Atelectasis: Secondary | ICD-10-CM | POA: Diagnosis not present

## 2021-09-27 DIAGNOSIS — I700 Atherosclerosis of aorta: Secondary | ICD-10-CM | POA: Diagnosis not present

## 2021-09-27 DIAGNOSIS — I5023 Acute on chronic systolic (congestive) heart failure: Secondary | ICD-10-CM | POA: Diagnosis not present

## 2021-09-27 DIAGNOSIS — M19042 Primary osteoarthritis, left hand: Secondary | ICD-10-CM | POA: Diagnosis not present

## 2021-09-27 DIAGNOSIS — E785 Hyperlipidemia, unspecified: Secondary | ICD-10-CM | POA: Diagnosis not present

## 2021-09-27 DIAGNOSIS — M5134 Other intervertebral disc degeneration, thoracic region: Secondary | ICD-10-CM | POA: Diagnosis not present

## 2021-09-27 DIAGNOSIS — R918 Other nonspecific abnormal finding of lung field: Secondary | ICD-10-CM | POA: Diagnosis not present

## 2021-09-27 DIAGNOSIS — Z87891 Personal history of nicotine dependence: Secondary | ICD-10-CM | POA: Diagnosis not present

## 2021-09-27 DIAGNOSIS — I5032 Chronic diastolic (congestive) heart failure: Secondary | ICD-10-CM | POA: Diagnosis not present

## 2021-09-27 DIAGNOSIS — H3530 Unspecified macular degeneration: Secondary | ICD-10-CM | POA: Diagnosis not present

## 2021-09-27 DIAGNOSIS — D692 Other nonthrombocytopenic purpura: Secondary | ICD-10-CM | POA: Diagnosis not present

## 2021-09-27 DIAGNOSIS — Z9981 Dependence on supplemental oxygen: Secondary | ICD-10-CM | POA: Diagnosis not present

## 2021-09-27 DIAGNOSIS — M19041 Primary osteoarthritis, right hand: Secondary | ICD-10-CM | POA: Diagnosis not present

## 2021-09-27 DIAGNOSIS — I110 Hypertensive heart disease with heart failure: Secondary | ICD-10-CM | POA: Diagnosis not present

## 2021-09-27 DIAGNOSIS — M810 Age-related osteoporosis without current pathological fracture: Secondary | ICD-10-CM | POA: Diagnosis not present

## 2021-09-27 DIAGNOSIS — I2510 Atherosclerotic heart disease of native coronary artery without angina pectoris: Secondary | ICD-10-CM | POA: Diagnosis not present

## 2021-10-03 ENCOUNTER — Ambulatory Visit: Payer: Medicare Other | Admitting: Oncology

## 2021-10-03 ENCOUNTER — Other Ambulatory Visit: Payer: Medicare Other

## 2021-10-03 ENCOUNTER — Institutional Professional Consult (permissible substitution): Payer: Medicare Other | Admitting: Radiation Oncology

## 2021-10-04 DIAGNOSIS — H3530 Unspecified macular degeneration: Secondary | ICD-10-CM | POA: Diagnosis not present

## 2021-10-04 DIAGNOSIS — Z9981 Dependence on supplemental oxygen: Secondary | ICD-10-CM | POA: Diagnosis not present

## 2021-10-04 DIAGNOSIS — Z87891 Personal history of nicotine dependence: Secondary | ICD-10-CM | POA: Diagnosis not present

## 2021-10-04 DIAGNOSIS — I110 Hypertensive heart disease with heart failure: Secondary | ICD-10-CM | POA: Diagnosis not present

## 2021-10-04 DIAGNOSIS — G8929 Other chronic pain: Secondary | ICD-10-CM | POA: Diagnosis not present

## 2021-10-04 DIAGNOSIS — E785 Hyperlipidemia, unspecified: Secondary | ICD-10-CM | POA: Diagnosis not present

## 2021-10-04 DIAGNOSIS — Z7902 Long term (current) use of antithrombotics/antiplatelets: Secondary | ICD-10-CM | POA: Diagnosis not present

## 2021-10-04 DIAGNOSIS — M5134 Other intervertebral disc degeneration, thoracic region: Secondary | ICD-10-CM | POA: Diagnosis not present

## 2021-10-04 DIAGNOSIS — M19041 Primary osteoarthritis, right hand: Secondary | ICD-10-CM | POA: Diagnosis not present

## 2021-10-04 DIAGNOSIS — J431 Panlobular emphysema: Secondary | ICD-10-CM | POA: Diagnosis not present

## 2021-10-04 DIAGNOSIS — I700 Atherosclerosis of aorta: Secondary | ICD-10-CM | POA: Diagnosis not present

## 2021-10-04 DIAGNOSIS — D692 Other nonthrombocytopenic purpura: Secondary | ICD-10-CM | POA: Diagnosis not present

## 2021-10-04 DIAGNOSIS — H9190 Unspecified hearing loss, unspecified ear: Secondary | ICD-10-CM | POA: Diagnosis not present

## 2021-10-04 DIAGNOSIS — R918 Other nonspecific abnormal finding of lung field: Secondary | ICD-10-CM | POA: Diagnosis not present

## 2021-10-04 DIAGNOSIS — J9811 Atelectasis: Secondary | ICD-10-CM | POA: Diagnosis not present

## 2021-10-04 DIAGNOSIS — Z9181 History of falling: Secondary | ICD-10-CM | POA: Diagnosis not present

## 2021-10-04 DIAGNOSIS — K219 Gastro-esophageal reflux disease without esophagitis: Secondary | ICD-10-CM | POA: Diagnosis not present

## 2021-10-04 DIAGNOSIS — M810 Age-related osteoporosis without current pathological fracture: Secondary | ICD-10-CM | POA: Diagnosis not present

## 2021-10-04 DIAGNOSIS — I2510 Atherosclerotic heart disease of native coronary artery without angina pectoris: Secondary | ICD-10-CM | POA: Diagnosis not present

## 2021-10-04 DIAGNOSIS — Z7982 Long term (current) use of aspirin: Secondary | ICD-10-CM | POA: Diagnosis not present

## 2021-10-04 DIAGNOSIS — I214 Non-ST elevation (NSTEMI) myocardial infarction: Secondary | ICD-10-CM | POA: Diagnosis not present

## 2021-10-04 DIAGNOSIS — I5032 Chronic diastolic (congestive) heart failure: Secondary | ICD-10-CM | POA: Diagnosis not present

## 2021-10-04 DIAGNOSIS — M19042 Primary osteoarthritis, left hand: Secondary | ICD-10-CM | POA: Diagnosis not present

## 2021-10-04 DIAGNOSIS — I5023 Acute on chronic systolic (congestive) heart failure: Secondary | ICD-10-CM | POA: Diagnosis not present

## 2021-10-05 ENCOUNTER — Ambulatory Visit
Admission: RE | Admit: 2021-10-05 | Discharge: 2021-10-05 | Disposition: A | Discharge status: Home / Self Care | Payer: Medicare Other | Source: Ambulatory Visit | Attending: Radiation Oncology | Admitting: Radiation Oncology

## 2021-10-05 ENCOUNTER — Encounter: Payer: Self-pay | Admitting: Oncology

## 2021-10-05 ENCOUNTER — Other Ambulatory Visit: Payer: Self-pay

## 2021-10-05 ENCOUNTER — Encounter: Payer: Self-pay | Admitting: Radiation Oncology

## 2021-10-05 ENCOUNTER — Inpatient Hospital Stay: Payer: Medicare Other | Attending: Oncology | Admitting: Oncology

## 2021-10-05 ENCOUNTER — Inpatient Hospital Stay: Payer: Medicare Other

## 2021-10-05 ENCOUNTER — Encounter: Payer: Self-pay | Admitting: *Deleted

## 2021-10-05 VITALS — BP 123/66 | HR 82 | Temp 97.7°F | Wt 102.0 lb

## 2021-10-05 DIAGNOSIS — J9611 Chronic respiratory failure with hypoxia: Secondary | ICD-10-CM | POA: Insufficient documentation

## 2021-10-05 DIAGNOSIS — K219 Gastro-esophageal reflux disease without esophagitis: Secondary | ICD-10-CM | POA: Insufficient documentation

## 2021-10-05 DIAGNOSIS — Z7982 Long term (current) use of aspirin: Secondary | ICD-10-CM | POA: Insufficient documentation

## 2021-10-05 DIAGNOSIS — I110 Hypertensive heart disease with heart failure: Secondary | ICD-10-CM | POA: Insufficient documentation

## 2021-10-05 DIAGNOSIS — J439 Emphysema, unspecified: Secondary | ICD-10-CM | POA: Insufficient documentation

## 2021-10-05 DIAGNOSIS — I10 Essential (primary) hypertension: Secondary | ICD-10-CM | POA: Diagnosis not present

## 2021-10-05 DIAGNOSIS — R059 Cough, unspecified: Secondary | ICD-10-CM | POA: Insufficient documentation

## 2021-10-05 DIAGNOSIS — Z87891 Personal history of nicotine dependence: Secondary | ICD-10-CM | POA: Diagnosis not present

## 2021-10-05 DIAGNOSIS — I252 Old myocardial infarction: Secondary | ICD-10-CM | POA: Insufficient documentation

## 2021-10-05 DIAGNOSIS — R918 Other nonspecific abnormal finding of lung field: Secondary | ICD-10-CM | POA: Diagnosis not present

## 2021-10-05 DIAGNOSIS — Z9981 Dependence on supplemental oxygen: Secondary | ICD-10-CM | POA: Diagnosis not present

## 2021-10-05 DIAGNOSIS — Z79899 Other long term (current) drug therapy: Secondary | ICD-10-CM | POA: Diagnosis not present

## 2021-10-05 DIAGNOSIS — I509 Heart failure, unspecified: Secondary | ICD-10-CM | POA: Insufficient documentation

## 2021-10-05 DIAGNOSIS — M5134 Other intervertebral disc degeneration, thoracic region: Secondary | ICD-10-CM | POA: Insufficient documentation

## 2021-10-05 DIAGNOSIS — M810 Age-related osteoporosis without current pathological fracture: Secondary | ICD-10-CM | POA: Diagnosis not present

## 2021-10-05 DIAGNOSIS — R911 Solitary pulmonary nodule: Secondary | ICD-10-CM | POA: Diagnosis not present

## 2021-10-05 DIAGNOSIS — E785 Hyperlipidemia, unspecified: Secondary | ICD-10-CM | POA: Insufficient documentation

## 2021-10-05 NOTE — Consult Note (Signed)
NEW PATIENT EVALUATION  Name: Samantha Dawson  MRN: [id number]  Date:   10/05/2021     DOB: [birth year]   This 87 y.o. female patient presents to the clinic for initial evaluation of probable stage I non-small cell lung cancer of the right upper lobe.  REFERRING PHYSICIAN: Steele Sizer, MD  CHIEF COMPLAINT:  Chief Complaint  Patient presents with   Lung Cancer    DIAGNOSIS: The encounter diagnosis was Lung mass.   PREVIOUS INVESTIGATIONS:  CT scans PET CT scans reviewed Labs reviewed Clinical notes reviewed         HPI: Patient is an 87 year old female recently admitted to [facility] with a small MI receiving stent placement.  She also congestive heart failure and received IV diuresis.  She had work-up incidental finding on her chest CT showing right upper lobe spiculated nodule concerning for malignancy.  She had a PET CT scan as an outpatient did not show hypermetabolic activity although for a lipidic slow-growing adenocarcinoma this would be typical.  She is on nasal oxygen she is asymptomatic specifically [place] cough hemoptysis or chest tightness she has been seen by medical oncology is now referred referred to radiation oncology for opinion.  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    PAST SURGICAL HISTORY:  Past Surgical History:  Procedure Laterality Date   ABDOMINAL HYSTERECTOMY     CATARACT EXTRACTION     CORONARY  STENT INTERVENTION N/A 09/12/2021   Procedure: CORONARY STENT INTERVENTION;  Surgeon: Wellington Hampshire, MD;  Location: [place];  Service: Cardiovascular;  Laterality: N/A;   endaryerectomy     LEFT HEART CATH AND CORONARY ANGIOGRAPHY N/A 09/12/2021   Procedure: LEFT HEART CATH AND CORONARY ANGIOGRAPHY;  Surgeon: Wellington Hampshire, MD;  Location: [place] [facility];  Service: Cardiovascular;  Laterality: N/A;   renal stenting      FAMILY HISTORY: family history includes Arthritis in her daughter; Cancer in her brother, brother, mother, and sister; Cerebrovascular Accident in her father; Coronary artery disease in her father; Heart disease in her daughter; Lung disease in her brother; Stroke in her sister.  SOCIAL HISTORY:  reports that she quit smoking about 5 years ago. Her smoking use included cigarettes. She has a 50.00 pack-year smoking history. She has never been exposed to tobacco smoke. She has never used smokeless tobacco. She reports that she does not drink alcohol and does not use drugs.  ALLERGIES: Alendronate  MEDICATIONS:  Current Outpatient Medications  Medication Sig Dispense Refill   albuterol (PROAIR HFA) 108 (90 Base) MCG/ACT inhaler INHALE 2 PUFFS INTO THE LUNGS EVERY 4 HOURS AS NEEDED FOR WHEEZING OR SHORTNESS OF BREATH; FUTURE REFILLS FROM LUNG DOCTOR 8.5 g 3   aspirin 81 MG tablet Take 81 mg by mouth daily.     [START ON 11/06/2021] HYDROcodone-acetaminophen (NORCO/VICODIN) 5-325 MG tablet Take 1 tablet by mouth 3 (three) times daily as needed for moderate pain. Fill September 07/31/2021 90 tablet 0   [START ON 10/07/2021] HYDROcodone-acetaminophen (NORCO/VICODIN) 5-325 MG tablet Take 1 tablet by mouth 3 (three) times daily as needed for  moderate pain. To last one month 90 tablet 0   HYDROcodone-acetaminophen (NORCO/VICODIN) 5-325 MG tablet Take 1 tablet by mouth 3 (three) times daily as needed for moderate pain. To last one month 90 tablet 0   pantoprazole  (PROTONIX) 20 MG tablet Take 1 tablet (20 mg total) by mouth 2 (two) times daily. Caution:prolonged use may increase risk of pneumonia, colitis, osteoporosis, anemia.  Skip doses if you can 180 tablet 1   rosuvastatin (CRESTOR) 20 MG tablet Take 20 mg by mouth daily.     ticagrelor (BRILINTA) 90 MG TABS tablet Take 1 tablet (90 mg total) by mouth 2 (two) times daily. 180 tablet 3   umeclidinium-vilanterol (ANORO ELLIPTA) 62.5-25 MCG/ACT AEPB Inhale 1 puff into the lungs daily at 6 (six) AM. 1 each 2   No current facility-administered medications for this encounter.    ECOG PERFORMANCE STATUS:  0 - Asymptomatic  REVIEW OF SYSTEMS: Patient recent MI and congestive heart failure Patient denies any weight loss, fatigue, weakness, fever, chills or night sweats. Patient denies any loss of vision, blurred vision. Patient denies any ringing  of the ears or hearing loss. No irregular heartbeat. Patient denies heart murmur or history of fainting. Patient denies any chest pain or pain radiating to her upper extremities. Patient denies any shortness of breath, difficulty breathing at night, cough or hemoptysis. Patient denies any swelling in the lower legs. Patient denies any nausea vomiting, vomiting of blood, or coffee ground material in the vomitus. Patient denies any stomach pain. Patient states has had normal bowel movements no significant constipation or diarrhea. Patient denies any dysuria, hematuria or significant nocturia. Patient denies any problems walking, swelling in the joints or loss of balance. Patient denies any skin changes, loss of hair or loss of weight. Patient denies any excessive worrying or anxiety or significant depression. Patient denies any problems with insomnia. Patient denies excessive thirst, polyuria, polydipsia. Patient denies any swollen glands, patient denies easy bruising or easy bleeding. Patient denies any recent infections, allergies or URI. Patient "s visual fields have not  changed significantly in recent time.   PHYSICAL EXAM: BP 123/66   Pulse 82   Temp 97.7 F (36.5 C) (Tympanic)   Wt 102 lb (46.3 kg)   BMI 19.92 kg/m  Frail-appearing elderly female in NAD wheelchair-bound and on nasal oxygen.  Well-developed well-nourished patient in NAD. HEENT reveals PERLA, EOMI, discs not visualized.  Oral cavity is clear. No oral mucosal lesions are identified. Neck is clear without evidence of cervical or supraclavicular adenopathy. Lungs are clear to A&P. Cardiac examination is essentially unremarkable with regular rate and rhythm without murmur rub or thrill. Abdomen is benign with no organomegaly or masses noted. Motor sensory and DTR levels are equal and symmetric in the upper and lower extremities. Cranial nerves II through XII are grossly intact. Proprioception is intact. No peripheral adenopathy or edema is identified. No motor or sensory levels are noted. Crude visual fields are within normal range.  LABORATORY DATA: Labs reviewed    RADIOLOGY RESULTS: CT scan and PET CT scan reviewed compatible with above-stated findings   IMPRESSION: Probable low-grade adenocarcinoma the right upper lobe in 87 year old female  PLAN: At this time I plan to observe the lesion.  I have asked for repeat CT scan of the chest in 3 to 4 months.  Should be progression at that time would offer SBRT without biopsy.  Risks and benefits of SBRT including low side effect profile were all reviewed with the  patient and her daughter.  They are both in agreement with this treatment plan.  Follow-up appointment as well CT scans were scheduled.  I would like to take this opportunity to thank you for allowing me to participate in the care of your patient.Noreene Filbert, MD

## 2021-10-05 NOTE — Progress Notes (Signed)
Hematology/Oncology Progress Note  [phone] [phone]  Patient Care Team: Steele Sizer, MD as PCP - General (Family Medicine) Dionisio David, MD as Consulting Physician (Cardiology) Telford Nab, RN as Oncology Nurse Navigator   Name of the patient: Samantha Dawson  [id number]  Nov 19, 1928  Date of visit: 10/05/21   INTERVAL HISTORY-  87 y.o.Female present for follow-up after her recent hospitalization. Patient was accompanied by her daughter. She is chronically on oxygen supplementation.  She feels well since the discharge. Recent NSTEMI status post stent placement, patient is currently on dual antiplatelet therapy. 09/13/2021, CT chest showed small bilateral pleural effusion.  1.5 cm right upper lobe nodule with irregular/spiculated margin.  Concerning for malignancy. 09/26/2021, PET scan showed no hypermetabolic activity of the irregular solid nodule to right upper lung.  Could be focal scarring versus indolent primary lung neoplasm.  No evidence of metastatic disease in the chest abdomen pelvis.  Bibasilar opacities with mild FDG uptake likely due to infection or aspiration.  Aortic atherosclerosis, emphysema.    Review of systems- Review of Systems  Constitutional:  Positive for fatigue. Negative for chills and fever.  HENT:   Negative for hearing loss and voice change.   Eyes:  Negative for eye problems.  Respiratory:  Positive for shortness of breath. Negative for chest tightness and cough.   Cardiovascular:  Negative for chest pain.  Gastrointestinal:  Negative for abdominal distention, abdominal pain and blood in stool.  Endocrine: Negative for hot flashes.  Genitourinary:  Negative for difficulty urinating and frequency.   Musculoskeletal:  Negative for arthralgias.  Skin:  Negative for itching and rash.  Neurological:  Negative for extremity weakness.  Hematological:  Negative for adenopathy.  Psychiatric/Behavioral:  Negative for  confusion.    Allergies  Allergen Reactions   Alendronate Nausea Only    Patient Active Problem List   Diagnosis Date Noted   Lung mass    Elevated troponin 09/10/2021   CHF (congestive heart failure), NYHA class IV, acute, diastolic ([place]) 78/24/2353   Thrombocytopenia ([place]) 61/44/3154   Acute systolic CHF (congestive heart failure) ([place]) 08/23/2021   Hypertensive emergency 08/23/2021   NSTEMI (non-ST elevated myocardial infarction) ([place]) 08/18/2021   History of nonmelanoma skin cancer 06/06/2020   Senile purpura ([place]) 05/04/2020   Nocturnal hypoxemia due to emphysema ([place]) 05/04/2020   Carotid atherosclerosis, bilateral 08/27/2018   Bilateral carotid bruits 08/27/2018   CKD (chronic kidney disease) stage 3, GFR 30-59 ml/min (HCC) 08/27/2018   Age-related osteoporosis without current pathological fracture 08/11/2018   Shoulder pain, left 08/29/2017   Acid reflux 00/86/7619   Uncomplicated opioid use 50/93/2671   Hyperglycemia 09/02/2016   Left thyroid nodule 07/13/2016   Abnormal bone scan of cervical spine 06/18/2016   Primary osteoarthritis of both hands 05/03/2016   Elevated serum alkaline phosphatase level 05/03/2016   Hyperkalemia 05/03/2016   Degenerative disc disease, thoracic 05/03/2016   Hx of smoking 04/17/2016   Controlled substance agreement signed 03/22/2016   Back pain 03/22/2016   Hyperlipidemia 10/25/2015   Essential hypertension 10/25/2015   Panlobular emphysema ([place]) 10/25/2015   Chronic pain 05/25/2015   Actinic keratosis 05/25/2015     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   Encounter for chronic pain management  Hx of smoking 04/17/2016   Hyperlipidemia    Hypertension    Macular degeneration     Osteoarthritis    Osteoporosis 03/22/2016   Primary osteoarthritis of both hands 05/03/2016     Past Surgical History:  Procedure Laterality Date   ABDOMINAL HYSTERECTOMY     CATARACT EXTRACTION     CORONARY STENT INTERVENTION N/A 09/12/2021   Procedure: CORONARY STENT INTERVENTION;  Surgeon: Wellington Hampshire, MD;  Location: [facility];  Service: Cardiovascular;  Laterality: N/A;   endaryerectomy     LEFT HEART CATH AND CORONARY ANGIOGRAPHY N/A 09/12/2021   Procedure: LEFT HEART CATH AND CORONARY ANGIOGRAPHY;  Surgeon: Wellington Hampshire, MD;  Location: [facility];  Service: Cardiovascular;  Laterality: N/A;   renal stenting      Social History   Socioeconomic History   Marital status: Widowed    Spouse name: Ihor Gully   Number of children: 3   Years of education: Not on file   Highest education level: 10th grade  Occupational History   Occupation: Retired  Tobacco Use   Smoking status: Former    Packs/day: 1.00    Years: 50.00    Pack years: 50.00    Types: Cigarettes    Quit date: 2017    Years since quitting: 5.9    Passive exposure: Never   Smokeless tobacco: Never   Tobacco comments:    smoking cesssation materials not required  Vaping Use   Vaping Use: Never used  Substance and Sexual Activity   Alcohol use: No    Alcohol/week: 0.0 standard drinks   Drug use: No   Sexual activity: Not Currently    Birth control/protection: None  Other Topics Concern   Not on file  Social History Narrative   Pt lives alone   Social Determinants of Health   Financial Resource Strain: Low Risk    Difficulty of Paying Living Expenses: Not hard at all  Food Insecurity: No Food Insecurity   Worried About Charity fundraiser in the Last Year: Never true   [place] in the Last Year: Never true  Transportation Needs: No Transportation Needs   Lack of Transportation (Medical): No   Lack of Transportation (Non-Medical): No  Physical Activity: Inactive    Days of Exercise per Week: 0 days   Minutes of Exercise per Session: 0 min  Stress: No Stress Concern Present   Feeling of Stress : Only a little  Social Connections: Moderately Isolated   Frequency of Communication with Friends and Family: More than three times a week   Frequency of Social Gatherings with Friends and Family: More than three times a week   Attends Religious Services: More than 4 times per year   Active Member of [employer] or Organizations: No   Attends Archivist Meetings: Never   Marital Status: Widowed  Human resources officer Violence: Not At Risk   Fear of Current or Ex-Partner: No   Emotionally Abused: No   Physically Abused: No   Sexually Abused: No     Family History  Problem Relation Age of Onset   Cancer Mother    Coronary artery disease Father    Cerebrovascular Accident Father    Cancer Sister        breast cancer   Cancer Brother        stomach   Heart disease Daughter        heart vavle bypass?   Stroke Sister  Lung disease Brother    Cancer Brother        unknown   Arthritis Daughter      Current Outpatient Medications:    albuterol (PROAIR HFA) 108 (90 Base) MCG/ACT inhaler, INHALE 2 PUFFS INTO THE LUNGS EVERY 4 HOURS AS NEEDED FOR WHEEZING OR SHORTNESS OF BREATH; FUTURE REFILLS FROM LUNG DOCTOR, Disp: 8.5 g, Rfl: 3   aspirin 81 MG tablet, Take 81 mg by mouth daily., Disp: , Rfl:    [START ON 11/06/2021] HYDROcodone-acetaminophen (NORCO/VICODIN) 5-325 MG tablet, Take 1 tablet by mouth 3 (three) times daily as needed for moderate pain. Fill September 07/31/2021, Disp: 90 tablet, Rfl: 0   [START ON 10/07/2021] HYDROcodone-acetaminophen (NORCO/VICODIN) 5-325 MG tablet, Take 1 tablet by mouth 3 (three) times daily as needed for moderate pain. To last one month, Disp: 90 tablet, Rfl: 0   HYDROcodone-acetaminophen (NORCO/VICODIN) 5-325 MG tablet, Take 1 tablet by mouth 3 (three) times daily as needed for moderate pain. To last one month, Disp: 90  tablet, Rfl: 0   pantoprazole (PROTONIX) 20 MG tablet, Take 1 tablet (20 mg total) by mouth 2 (two) times daily. Caution:prolonged use may increase risk of pneumonia, colitis, osteoporosis, anemia.  Skip doses if you can, Disp: 180 tablet, Rfl: 1   ticagrelor (BRILINTA) 90 MG TABS tablet, Take 1 tablet (90 mg total) by mouth 2 (two) times daily., Disp: 180 tablet, Rfl: 3   umeclidinium-vilanterol (ANORO ELLIPTA) 62.5-25 MCG/ACT AEPB, Inhale 1 puff into the lungs daily at 6 (six) AM., Disp: 1 each, Rfl: 2   rosuvastatin (CRESTOR) 20 MG tablet, Take 20 mg by mouth daily., Disp: , Rfl:    Physical exam:  Vitals:   10/05/21 0958  BP: 123/66  Pulse: 82  Temp: 97.7 F (36.5 C)  TempSrc: Tympanic  SpO2: 96%  Weight: 102 lb (46.3 kg)   Physical Exam Constitutional:      General: She is not in acute distress.    Appearance: She is not diaphoretic.  HENT:     Head: Normocephalic and atraumatic.     Nose: Nose normal.     Mouth/Throat:     Pharynx: No oropharyngeal exudate.  Eyes:     General: No scleral icterus.    Pupils: Pupils are equal, round, and reactive to light.  Cardiovascular:     Rate and Rhythm: Normal rate and regular rhythm.     Heart sounds: No murmur heard. Pulmonary:     Effort: Pulmonary effort is normal. No respiratory distress.     Breath sounds: No rales.     Comments: Decreased breath sound bilaterally Chest:     Chest wall: No tenderness.  Abdominal:     General: There is no distension.     Palpations: Abdomen is soft.     Tenderness: There is no abdominal tenderness.  Musculoskeletal:        General: Normal range of motion.     Cervical back: Normal range of motion and neck supple.  Skin:    General: Skin is warm and dry.     Findings: No erythema.  Neurological:     Mental Status: She is alert and oriented to person, place, and time.     Cranial Nerves: No cranial nerve deficit.     Motor: No abnormal muscle tone.     Coordination: Coordination  normal.  Psychiatric:        Mood and Affect: Affect normal.       CMP Latest Ref Rng &  Units 09/17/2021  Glucose 70 - 99 mg/dL 103(H)  BUN 8 - 23 mg/dL 25(H)  Creatinine 0.44 - 1.00 mg/dL 1.16(H)  Sodium 135 - 145 mmol/L 141  Potassium 3.5 - 5.1 mmol/L 3.8  Chloride 98 - 111 mmol/L 103  CO2 22 - 32 mmol/L 30  Calcium 8.9 - 10.3 mg/dL 9.1  Total Protein 6.5 - 8.1 g/dL -  Total Bilirubin 0.3 - 1.2 mg/dL -  Alkaline Phos 38 - 126 U/L -  AST 15 - 41 U/L -  ALT 0 - 44 U/L -   CBC Latest Ref Rng & Units 09/14/2021  WBC 4.0 - 10.5 K/uL 11.8(H)  Hemoglobin 12.0 - 15.0 g/dL 11.2(L)  Hematocrit 36.0 - 46.0 % 34.2(L)  Platelets 150 - 400 K/uL 192    RADIOGRAPHIC STUDIES: I have personally reviewed the radiological images as listed and agreed with the findings in the report. DG Chest 1 View  Result Date: 09/09/2021 CLINICAL DATA:  Increasing shortness of breath, coronary artery disease, recent MI 3 weeks ago, pedal edema, COPD, hypertension EXAM: CHEST  1 VIEW COMPARISON:  Portable exam 1429 hours compared 08/22/2021 FINDINGS: Enlargement of cardiac silhouette with pulmonary vascular congestion. Atherosclerotic calcification aorta. Interstitial infiltrates in both lungs greatest at bases with coexistence bibasilar effusions and atelectasis. Findings favor CHF. Suspect underlying emphysematous changes. No pneumothorax. Bones demineralized. IMPRESSION: Enlargement of cardiac silhouette with pulmonary vascular congestion and probable pulmonary edema/CHF. Small bibasilar pleural effusions and atelectasis. Underlying emphysematous changes. Aortic Atherosclerosis (ICD10-I70.0) and Emphysema (ICD10-J43.9). Electronically Signed   By: Lavonia [facility] M.D.   On: 09/09/2021 14:44   CT CHEST WO CONTRAST  Result Date: 09/13/2021 CLINICAL DATA:  Chest pain and shortness of breath. EXAM: CT CHEST WITHOUT CONTRAST TECHNIQUE: Multidetector CT imaging of the chest was performed following the standard protocol  without IV contrast. COMPARISON:  Chest radiograph dated 09/13/2021. FINDINGS: Evaluation of this exam is limited in the absence of intravenous contrast. Cardiovascular: Were top-normal cardiac size. No pericardial effusion. Advanced 3 vessel coronary vascular calcification and calcification of the mitral annulus. Four there is advanced atherosclerotic calcification of the thoracic aorta. The central pulmonary arteries are grossly unremarkable. Mediastinum/Nodes: No hilar or mediastinal adenopathy. The esophagus is grossly unremarkable. A 1.3 cm left thyroid hypodense lesion. In the setting of significant comorbidities or limited life expectancy, no follow-up recommended (ref: J Am Coll Radiol. 2015 Feb;12(2): 143-50).No mediastinal fluid collection. No focal or Lungs/Pleura: Small bilateral pleural effusions, left greater right. There is partial compressive atelectasis of the lower lobes versus pneumonia. Background of severe emphysema. Forty pole pole there is no pneumothorax. The central airways are patent. There is a 1.5 cm nodule in the right upper lobe with irregular or spiculated margin. Multidisciplinary consult and further evaluation with PET-CT if clinically indicated, recommended. Upper Abdomen: Partially visualized 2.5 cm left renal cyst. Musculoskeletal: Osteopenia with degenerative changes of the spine. No acute osseous pathology. IMPRESSION: 1. Small bilateral pleural effusions, left greater right with partial compressive atelectasis of the lower lobes versus pneumonia. 2. A 1.5 cm right upper lobe nodule with irregular or spiculated margin. This is concerning for malignancy. Multidisciplinary consult and further evaluation with PET-CT, if clinically indicated, recommended. 3. Aortic Atherosclerosis (ICD10-I70.0) and Emphysema (ICD10-J43.9). Electronically Signed   By: Anner Crete M.D.   On: 09/13/2021 22:58   CARDIAC CATHETERIZATION  Result Date: 09/13/2021   Ost LM lesion is 30% stenosed.    Prox Cx to Mid Cx lesion is 100% stenosed.   Prox LAD  to Mid LAD lesion is 99% stenosed.   Mid LAD to Dist LAD lesion is 30% stenosed.   A drug-eluting stent was successfully placed using a STENT ONYX FRONTIER [id number].   Post intervention, there is a 25% residual stenosis. 1.  Severe two-vessel coronary artery disease with chronic occlusion of proximal/mid left circumflex with left to left collaterals and severe 99% stenosis in the proximal LAD which is the likely culprit for non-ST elevation myocardial infarction. 2.  Left ventricular angiography was not performed due to underlying chronic kidney disease. 3.  Severely elevated left ventricular end-diastolic pressure at 31 mmHg. 4.  Successful angioplasty and drug-eluting stent placement to the proximal LAD.  Suboptimal results due to inability to fully expand the stent proximally due to heavy calcifications.  The procedure was limited by a 5 [place] guide system given lack of alternative vascular access.  A small first diagonal was jailed by the stent with TIMI I flow but the vessel was too small to rescue. Recommendations: Dual antiplatelet therapy for at least 12 months. Will give 1 dose of IV furosemide given shortness of breath and significantly elevated left ventricular end-diastolic pressure. Aggressive treatment of risk factors. Please note that the case was attempted initially from the right femoral access by Dr. Humphrey Rolls but was not able to advance the wire more than few centimeters and thus femoral access was aborted and I elected for right radial artery access in spite of small right radial artery size by ultrasound.   US RENAL  Result Date: 09/11/2021 CLINICAL DATA:  Acute renal insufficiency. EXAM: RENAL / URINARY TRACT ULTRASOUND COMPLETE COMPARISON:  None. FINDINGS: Right Kidney: Renal measurements: 9.3 x 4.2 x 4.6 cm = volume: 95 mL. Echogenicity within normal limits. No hydronephrosis visualized. There is a simple cyst exophytic off of the lower  right renal cortex measuring 1.8 cm in greatest dimension. Left Kidney: Renal measurements: 7.9 x 3.3 x 3.6 = volume: 48 mL. Echogenicity within normal limits. No hydronephrosis visualized. Two benign cysts seen. There is a 2.7 cm cyst in the midpole region of the left kidney and a 1.7 cm cyst, exophytic off of the lower pole of the left kidney. Bladder: Distended with slightly lobulated contour. Other: Bilateral pleural effusions. Heterogeneous echogenicity of the liver, partially visualized. IMPRESSION: Increased renal echogenicity, consistent with medical renal disease. Bilateral renal cysts. Heterogeneous echogenicity of the liver, partially visualized. Evaluation with right upper quadrant ultrasound or cross-sectional imaging may be considered. Bilateral pleural effusions. Electronically Signed   By: Fidela Salisbury M.D.   On: 09/11/2021 10:29   NM PET Image Initial (PI) Skull Base To Thigh  Result Date: 09/26/2021 CLINICAL DATA:  Initial treatment strategy for pulmonary nodule. EXAM: NUCLEAR MEDICINE PET SKULL BASE TO THIGH TECHNIQUE: 5.96 mCi F-18 FDG was injected intravenously. Full-ring PET imaging was performed from the skull base to thigh after the radiotracer. CT data was obtained and used for attenuation correction and anatomic localization. Fasting blood glucose: 91 mg/dl COMPARISON:  None. FINDINGS: Mediastinal blood pool activity: SUV max 2.1 Liver activity: SUV max 2.3 NECK: No hypermetabolic lymph nodes in the neck. Incidental CT findings: none CHEST: Irregular solid nodule of the right lung apex measuring up to 9 mm is unchanged in size compared to prior CT and demonstrates FDG uptake similar to background activity, SUV max of 0.8. Bibasilar opacities with mild FDG uptake. No hypermetabolic lymph nodes seen in the chest. Incidental CT findings: Mild cardiomegaly. Three-vessel coronary artery calcifications and mitral annular calcifications. Severe  atherosclerotic disease of the thoracic  aorta. Small hiatal hernia. Trace bilateral pleural effusions. Moderate centrilobular emphysema. ABDOMEN/PELVIS: No abnormal hypermetabolic activity within the liver, pancreas, adrenal glands, or spleen. No hypermetabolic lymph nodes in the abdomen or pelvis. Incidental CT findings: Bilateral low attenuating renal lesions which are likely simple cysts. Nonobstructing stones of the bilateral kidneys. SKELETON: No focal hypermetabolic activity to suggest skeletal metastasis. Incidental CT findings: none IMPRESSION: 1. Irregular solid nodule of the right lung apex is unchanged in size compared to prior CT and demonstrates no significant FDG uptake, likely an area of focal scarring, although indolent primary lung neoplasm cannot be excluded. Recommend follow-up chest CT in 6 months to ensure stability. 2. No evidence of metastatic disease in the chest, abdomen or pelvis. 3. Bibasilar opacities with mild FDG uptake, likely due to infection or aspiration. Recommend attention on follow-up. 4. Aortic Atherosclerosis (ICD10-I70.0) and Emphysema (ICD10-J43.9). Electronically Signed   By: Yetta Glassman M.D.   On: 09/26/2021 16:27   DG Chest Port 1 View  Result Date: 09/13/2021 CLINICAL DATA:  87 year old female with history of shortness of breath. EXAM: PORTABLE CHEST 1 VIEW COMPARISON:  Chest x-ray 09/11/2021. FINDINGS: Diffuse peribronchial cuffing, widespread interstitial prominence and patchy multifocal ill-defined airspace disease scattered throughout the lungs bilaterally, similar to the recent prior examination. Small bilateral pleural effusions. No pneumothorax. Heart size is normal. Upper mediastinal contours are within normal limits. Atherosclerotic calcifications in the thoracic aorta. IMPRESSION: 1. The appearance the chest is most compatible with multilobar bilateral bronchopneumonia. Aeration appears slightly worsened compared to the recent prior study. 2. Small bilateral pleural effusions. 3. Aortic  atherosclerosis. Electronically Signed   By: Vinnie Langton M.D.   On: 09/13/2021 09:01   DG Chest Port 1 View  Result Date: 09/11/2021 CLINICAL DATA:  Wheezing and dyspnea, COPD EXAM: PORTABLE CHEST 1 VIEW COMPARISON:  09/09/2021 chest radiograph. FINDINGS: Stable cardiomediastinal silhouette with normal heart size. No pneumothorax. Small bilateral pleural effusions, stable. New faint hazy opacities in the upper lungs bilaterally, right greater than left. Streaky and reticular bibasilar lung opacities, stable. IMPRESSION: 1. New faint hazy opacities in the upper lungs bilaterally, right greater than left, which could represent pneumonia or mild pulmonary edema. 2. Stable small bilateral pleural effusions. 3. Stable streaky and reticular bibasilar lung opacities, favor nonspecific scarring and/or atelectasis. Electronically Signed   By: Ilona Sorrel M.D.   On: 09/11/2021 08:01    Assessment and plan-  1. Lung nodule   2. Chronic respiratory failure with hypoxia (HCC)    #Right upper lobe irregular nodule PET scan showed no hypermetabolism. Discussed with patient and her daughter that this could be a focal scarring tissue or an indolent primary lung neoplasm.  Biopsy is not feasible due to being on antiplatelet therapy for recent stent placing, age and emphysema. Recommend observation. She has an appointment with radiation oncology today as well. I recommend follow-up with a repeat CT scan in 4 to 6 months and a follow-up afterwards.  Communicated with Dr. Baruch Gouty. Patient and her daughter agree with the recommendation.  Thank you for allowing me to participate in the care of this patient.   Earlie Server, MD, PhD 10/05/2021

## 2021-10-05 NOTE — Progress Notes (Signed)
Name: Samantha Dawson   MRN: [id number]    DOB: Jan 30, 1929   Date:10/06/2021       Progress Note  [facility] Follow Up  HPI  Admission date: 09/09/21 Discharge date: 09/17/21  She went in for progressive worsening of SOB over the previous few weeks. She was found to be hypoxic, diagnosed with acute on chronic CHF and NSTEMI. She had a stent placed and sent home with previous medications and addition of Brillinta. She has been compliant with medications since discharge. She denies chest pain and SOB is back to baseline ( emphysema/CHF), appetite is improving, denies orthopnea or PND. No lower extremity edema and only uses one pillow at night. During her stay an spiculated mass was found in her lung, follow up with Dr. Tasia Catchings and Dr. Donella Stade already done and since she cannot have a biopsy she will have repeat CT in 6 months. PET scan was done  Patient Active Problem List   Diagnosis Date Noted   Chronic diastolic congestive heart failure ([place]) 10/06/2021   Chronic respiratory failure with hypoxia ([place]) 10/05/2021   Lung mass    Elevated troponin 09/10/2021   CHF (congestive heart failure), NYHA class IV, acute, diastolic ([place]) 47/65/4650   Thrombocytopenia ([place]) 08/29/2021   Hypertensive emergency 08/23/2021   NSTEMI (non-ST elevated myocardial infarction) ([place]) 08/18/2021   History of nonmelanoma skin cancer 06/06/2020   Senile purpura ([place]) 05/04/2020   Nocturnal hypoxemia due to emphysema ([place]) 05/04/2020   Carotid atherosclerosis, bilateral 08/27/2018   Bilateral carotid bruits 08/27/2018   CKD (chronic kidney disease) stage 3, GFR 30-59 ml/min (HCC) 08/27/2018   Age-related osteoporosis without current pathological fracture 85/04/2018   Shoulder pain, left 08/29/2017   Acid reflux 85/46/5681   Uncomplicated opioid use 85/51/7001   Hyperglycemia 09/02/2016   Left thyroid nodule 07/13/2016   Abnormal bone scan of cervical spine 06/18/2016   Primary  osteoarthritis of both hands 05/03/2016   Elevated serum alkaline phosphatase level 05/03/2016   Degenerative disc disease, thoracic 05/03/2016   Hx of smoking 04/17/2016   Controlled substance agreement signed 03/22/2016   Back pain 03/22/2016   Hyperlipidemia 10/25/2015   Essential hypertension 10/25/2015   Panlobular emphysema ([place]) 10/25/2015   Chronic pain 05/25/2015   Actinic keratosis 05/25/2015    Past Surgical History:  Procedure Laterality Date   ABDOMINAL HYSTERECTOMY     CATARACT EXTRACTION     CORONARY STENT INTERVENTION N/A 09/12/2021   Procedure: CORONARY STENT INTERVENTION;  Surgeon: [place] Hampshire, MD;  Location: [place] [facility];  Service: Cardiovascular;  Laterality: N/A;   endaryerectomy     LEFT HEART CATH AND CORONARY ANGIOGRAPHY N/A 09/12/2021   Procedure: LEFT HEART CATH AND CORONARY ANGIOGRAPHY;  Surgeon: [place] Hampshire, MD;  Location: [place] [facility];  Service: Cardiovascular;  Laterality: N/A;   renal stenting      Family History  Problem Relation Age of Onset   Cancer Mother    Coronary artery disease Father    Cerebrovascular Accident Father    Cancer Sister        breast cancer   Cancer Brother        stomach   Heart disease Daughter        heart vavle bypass?   Stroke Sister    Lung disease Brother    Cancer Brother        unknown   Arthritis Daughter     Social History   Tobacco Use  Smoking status: Former    Packs/day: 1.00    Years: 50.00    Pack years: 50.00    Types: Cigarettes    Quit date: 2017    Years since quitting: 5.9    Passive exposure: Never   Smokeless tobacco: Never   Tobacco comments:    smoking cesssation materials not required  Substance Use Topics   Alcohol use: No    Alcohol/week: 0.0 standard drinks     Current Outpatient Medications:    albuterol (PROAIR HFA) 108 (90 Base) MCG/ACT inhaler, INHALE 2 PUFFS INTO THE LUNGS EVERY 4 HOURS AS NEEDED FOR WHEEZING OR SHORTNESS OF BREATH; FUTURE  REFILLS FROM LUNG DOCTOR, Disp: 8.5 g, Rfl: 3   aspirin 81 MG tablet, Take 81 mg by mouth daily., Disp: , Rfl:    [START ON 11/06/2021] HYDROcodone-acetaminophen (NORCO/VICODIN) 5-325 MG tablet, Take 1 tablet by mouth 3 (three) times daily as needed for moderate pain. Fill September 07/31/2021, Disp: 90 tablet, Rfl: 0   [START ON 10/07/2021] HYDROcodone-acetaminophen (NORCO/VICODIN) 5-325 MG tablet, Take 1 tablet by mouth 3 (three) times daily as needed for moderate pain. To last one month, Disp: 90 tablet, Rfl: 0   HYDROcodone-acetaminophen (NORCO/VICODIN) 5-325 MG tablet, Take 1 tablet by mouth 3 (three) times daily as needed for moderate pain. To last one month, Disp: 90 tablet, Rfl: 0   pantoprazole (PROTONIX) 20 MG tablet, Take 1 tablet (20 mg total) by mouth 2 (two) times daily. Caution:prolonged use may increase risk of pneumonia, colitis, osteoporosis, anemia.  Skip doses if you can, Disp: 180 tablet, Rfl: 1   rosuvastatin (CRESTOR) 20 MG tablet, Take 20 mg by mouth daily., Disp: , Rfl:    ticagrelor (BRILINTA) 90 MG TABS tablet, Take 1 tablet (90 mg total) by mouth 2 (two) times daily., Disp: 180 tablet, Rfl: 3   umeclidinium-vilanterol (ANORO ELLIPTA) 62.5-25 MCG/ACT AEPB, Inhale 1 puff into the lungs daily at 6 (six) AM., Disp: 1 each, Rfl: 2   furosemide (LASIX) 20 MG tablet, Take 1 tablet (20 mg total) by mouth daily. Take when you gain 2 lbs in 24 hours, Disp: 30 tablet, Rfl: 0   potassium chloride SA (KLOR-CON M) 20 MEQ tablet, Take 1 tablet (20 mEq total) by mouth daily. When you take furosemide, Disp: 30 tablet, Rfl: 0  Allergies  Allergen Reactions   Alendronate Nausea Only    I personally reviewed active problem list, medication list, allergies, family history, social history, health maintenance with the patient/caregiver today.   ROS  Constitutional: Negative for fever or weight change.  Respiratory: Negative for cough, positive for  shortness of breath.   Cardiovascular:  Negative for chest pain or palpitations.  Gastrointestinal: Negative for abdominal pain, no bowel changes.  Musculoskeletal: no joint swelling.  Skin: Negative for rash.  Neurological: Negative for dizziness or headache.  No other specific complaints in a complete review of systems (except as listed in HPI above).   Objective  Vitals:   10/06/21 0954  BP: 118/68  Pulse: 68  Resp: 16  Temp: 98 F (36.7 C)  SpO2: 90%  Weight: 102 lb (46.3 kg)  Height: 5' (1.524 m)    Body mass index is 19.92 kg/m.  Physical Exam  Constitutional: Patient appears frail, sitting on wheelchair  HEENT: head atraumatic, normocephalic, pupils equal and reactive to light,nasal cannula oxygen  Cardiovascular: Normal rate, regular rhythm and normal heart sounds.  No murmur heard. No BLE edema. Pulmonary/Chest: Effort normal , coarse crackles on posterior  lung  Abdominal: Soft.  There is no tenderness. Psychiatric: Patient has a normal mood and affect. behavior is normal. Judgment and thought content normal.   Recent Results (from the past 2160 hour(s))  Basic metabolic panel     Status: Abnormal   Collection Time: 08/18/21  4:27 PM  Result Value Ref Range   Sodium 140 135 - 145 mmol/L   Potassium 3.9 3.5 - 5.1 mmol/L   Chloride 105 98 - 111 mmol/L   CO2 25 22 - 32 mmol/L   Glucose, Bld 117 (H) 70 - 99 mg/dL    Comment: Glucose reference range applies only to samples taken after fasting for at least 8 hours.   BUN 27 (H) 8 - 23 mg/dL   Creatinine, Ser 0.92 0.44 - 1.00 mg/dL   Calcium 9.4 8.9 - 10.3 mg/dL   GFR, Estimated 60 (L) >60 mL/min    Comment: (NOTE) Calculated using the CKD-EPI Creatinine Equation (2021)    Anion gap 10 5 - 15    Comment: Performed at [facility], [address] [place], [place] [address]  CBC     Status: Abnormal   Collection Time: 08/18/21  4:27 PM  Result Value Ref Range   WBC 7.7 4.0 - 10.5 K/uL   RBC 4.13 3.87 - 5.11 MIL/uL   Hemoglobin 14.0 12.0 -  15.0 g/dL   HCT 41.9 36.0 - 46.0 %   MCV 101.5 (H) 80.0 - 100.0 fL   MCH 33.9 26.0 - 34.0 pg   MCHC 33.4 30.0 - 36.0 g/dL   RDW 13.5 11.5 - 15.5 %   Platelets 143 (L) 150 - 400 K/uL   nRBC 0.0 0.0 - 0.2 %    Comment: Performed at [facility], [place], [place] [address]  Troponin I (High Sensitivity)     Status: Abnormal   Collection Time: 08/18/21  4:27 PM  Result Value Ref Range   Troponin I (High Sensitivity) 144 (HH) <18 ng/L    Comment: CRITICAL RESULT CALLED TO, READ BACK BY AND VERIFIED WITH JANE RYAN @1707  08/18/21 MJU (NOTE) Elevated high sensitivity troponin I (hsTnI) values and significant  changes across serial measurements may suggest ACS but many other  chronic and acute conditions are known to elevate hsTnI results.  Refer to the "Links" section for chest pain algorithms and additional  guidance. Performed at [facility], [address] [place], [place] [address]   Resp Panel by RT-PCR (Flu A&B, Covid) Nasopharyngeal Swab     Status: None   Collection Time: 08/18/21  5:44 PM   Specimen: Nasopharyngeal Swab; Nasopharyngeal(NP) swabs in vial transport medium  Result Value Ref Range   SARS Coronavirus 2 by RT PCR NEGATIVE NEGATIVE    Comment: (NOTE) SARS-CoV-2 target nucleic acids are NOT DETECTED.  The SARS-CoV-2 RNA is generally detectable in upper respiratory specimens during the acute phase of infection. The lowest concentration of SARS-CoV-2 viral copies this assay can detect is 138 copies/mL. A negative result does not preclude SARS-Cov-2 infection and should not be used as the sole basis for treatment or other patient management decisions. A negative result may occur with  improper specimen collection/handling, submission of specimen other than nasopharyngeal swab, presence of viral mutation(s) within the areas targeted by this assay, and inadequate number of viral copies(<138 copies/mL). A negative result must be  combined with clinical observations, patient history, and epidemiological information. The expected result is Negative.  Fact Sheet for Patients:  EntrepreneurPulse.com.au  Fact Sheet for Healthcare Providers:  IncredibleEmployment.be  This test is no t yet approved or cleared by the [place] FDA and  has been authorized for detection and/or diagnosis of SARS-CoV-2 by FDA under an Emergency Use Authorization (EUA). This EUA will remain  in effect (meaning this test can be used) for the duration of the COVID-19 declaration under Section 564(b)(1) of the Act, 21 U.S.C.section 360bbb-3(b)(1), unless the authorization is terminated  or revoked sooner.       Influenza A by PCR NEGATIVE NEGATIVE   Influenza B by PCR NEGATIVE NEGATIVE    Comment: (NOTE) The Xpert Xpress SARS-CoV-2/FLU/RSV plus assay is intended as an aid in the diagnosis of influenza from Nasopharyngeal swab specimens and should not be used as a sole basis for treatment. Nasal washings and aspirates are unacceptable for Xpert Xpress SARS-CoV-2/FLU/RSV testing.  Fact Sheet for Patients: EntrepreneurPulse.com.au  Fact Sheet for Healthcare Providers: IncredibleEmployment.be  This test is not yet approved or cleared by the [place] FDA and has been authorized for detection and/or diagnosis of SARS-CoV-2 by FDA under an Emergency Use Authorization (EUA). This EUA will remain in effect (meaning this test can be used) for the duration of the COVID-19 declaration under Section 564(b)(1) of the Act, 21 U.S.C. section 360bbb-3(b)(1), unless the authorization is terminated or revoked.  Performed at [facility], [place], [place] [address]   Troponin I (High Sensitivity)     Status: Abnormal   Collection Time: 08/18/21  6:27 PM  Result Value Ref Range   Troponin I (High Sensitivity) 168 (HH) <18 ng/L    Comment:  CRITICAL VALUE NOTED. VALUE IS CONSISTENT WITH PREVIOUSLY REPORTED/CALLED VALUE SKL (NOTE) Elevated high sensitivity troponin I (hsTnI) values and significant  changes across serial measurements may suggest ACS but many other  chronic and acute conditions are known to elevate hsTnI results.  Refer to the "Links" section for chest pain algorithms and additional  guidance. Performed at [facility], [address] [place], [place] [address]   APTT     Status: None   Collection Time: 08/18/21  7:00 PM  Result Value Ref Range   aPTT 28 24 - 36 seconds    Comment: Performed at [facility], [address] [place], [place] [address]  Protime-INR     Status: None   Collection Time: 08/18/21  7:00 PM  Result Value Ref Range   Prothrombin Time 14.4 11.4 - 15.2 seconds   INR 1.1 0.8 - 1.2    Comment: (NOTE) INR goal varies based on device and disease states. Performed at [facility], [place], [place] [address]   Heparin level (unfractionated)     Status: None   Collection Time: 08/19/21  4:01 AM  Result Value Ref Range   Heparin Unfractionated 0.56 0.30 - 0.70 IU/mL    Comment: (NOTE) The clinical reportable range upper limit is being lowered to >1.10 to align with the FDA approved guidance for the current laboratory assay.  If heparin results are below expected values, and patient dosage has  been confirmed, suggest follow up testing of antithrombin III levels. Performed at [facility], [address] [place], [place] [address]   Basic metabolic panel     Status: Abnormal   Collection Time: 08/19/21  6:44 AM  Result Value Ref Range   Sodium 140 135 - 145 mmol/L   Potassium 4.1 3.5 - 5.1 mmol/L   Chloride 106 98 -  111 mmol/L   CO2 27 22 - 32 mmol/L   Glucose, Bld 117 (H) 70 - 99 mg/dL    Comment: Glucose reference range applies only to samples taken after fasting for at least 8 hours.   BUN 27 (H) 8 - 23 mg/dL    Creatinine, Ser 0.78 0.44 - 1.00 mg/dL   Calcium 8.9 8.9 - 10.3 mg/dL   GFR, Estimated >60 >60 mL/min    Comment: (NOTE) Calculated using the CKD-EPI Creatinine Equation (2021)    Anion gap 7 5 - 15    Comment: Performed at [facility], [address] [place], [place] [address]  Lipid panel     Status: None   Collection Time: 08/19/21  6:44 AM  Result Value Ref Range   Cholesterol 113 0 - 200 mg/dL   Triglycerides 36 <150 mg/dL   HDL 60 >40 mg/dL   Total CHOL/HDL Ratio 1.9 RATIO   VLDL 7 0 - 40 mg/dL   LDL Cholesterol 46 0 - 99 mg/dL    Comment:        Total Cholesterol/HDL:CHD Risk Coronary Heart Disease Risk Table                     Men   Women  1/2 Average Risk   3.4   3.3  Average Risk       5.0   4.4  2 X Average Risk   9.6   7.1  3 X Average Risk  23.4   11.0        Use the calculated Patient Ratio above and the CHD Risk Table to determine the patient's CHD Risk.        ATP III CLASSIFICATION (LDL):  <100     mg/dL   Optimal  100-129  mg/dL   Near or Above                    Optimal  130-159  mg/dL   Borderline  160-189  mg/dL   High  >190     mg/dL   Very High Performed at [facility], [address] [place], [place] [address]   CBC     Status: Abnormal   Collection Time: 08/19/21  6:44 AM  Result Value Ref Range   WBC 8.2 4.0 - 10.5 K/uL   RBC 3.69 (L) 3.87 - 5.11 MIL/uL   Hemoglobin 12.6 12.0 - 15.0 g/dL   HCT 36.8 36.0 - 46.0 %   MCV 99.7 80.0 - 100.0 fL   MCH 34.1 (H) 26.0 - 34.0 pg   MCHC 34.2 30.0 - 36.0 g/dL   RDW 13.6 11.5 - 15.5 %   Platelets 134 (L) 150 - 400 K/uL   nRBC 0.0 0.0 - 0.2 %    Comment: Performed at [facility], [address] [place], [place] [address]  Glucose, capillary     Status: Abnormal   Collection Time: 08/19/21  9:17 AM  Result Value Ref Range   Glucose-Capillary 112 (H) 70 - 99 mg/dL    Comment: Glucose reference range applies only to samples taken after fasting for at least 8 hours.   MRSA Next Gen by PCR, Nasal     Status: None   Collection Time: 08/19/21  9:25 AM   Specimen: Nasal Mucosa; Nasal Swab  Result Value Ref Range   MRSA by PCR Next Gen NOT DETECTED NOT DETECTED    Comment: (NOTE) The GeneXpert MRSA Assay (FDA  approved for NASAL specimens only), is one component of a comprehensive MRSA colonization surveillance program. It is not intended to diagnose MRSA infection nor to guide or monitor treatment for MRSA infections. Test performance is not FDA approved in patients less than 47 years old. Performed at [facility], [address] [place], [place] [address]   ECHOCARDIOGRAM COMPLETE     Status: None   Collection Time: 08/19/21 11:46 AM  Result Value Ref Range   Weight 1,767.21 oz   BP 153/110 mmHg   Ao pk vel 2.30 m/s   AV Area VTI 1.65 cm2   AR max vel 1.37 cm2   AV Mean grad 12.0 mmHg   AV Peak grad 21.2 mmHg   Single Plane A2C EF 45.7 %   Single Plane A4C EF 19.7 %   Calc EF 37.3 %   S' Lateral 2.50 cm   AV Area mean vel 1.38 cm2   Area-P 1/2 3.77 cm2   MV VTI 1.73 cm2  Heparin level (unfractionated)     Status: None   Collection Time: 08/19/21 12:27 PM  Result Value Ref Range   Heparin Unfractionated 0.37 0.30 - 0.70 IU/mL    Comment: (NOTE) The clinical reportable range upper limit is being lowered to >1.10 to align with the FDA approved guidance for the current laboratory assay.  If heparin results are below expected values, and patient dosage has  been confirmed, suggest follow up testing of antithrombin III levels. Performed at [facility], [address] [place], [place] [address]   Basic metabolic panel     Status: Abnormal   Collection Time: 08/20/21  5:35 AM  Result Value Ref Range   Sodium 140 135 - 145 mmol/L   Potassium 4.0 3.5 - 5.1 mmol/L   Chloride 106 98 - 111 mmol/L   CO2 28 22 - 32 mmol/L   Glucose, Bld 117 (H) 70 - 99 mg/dL    Comment: Glucose reference range applies only to samples  taken after fasting for at least 8 hours.   BUN 23 8 - 23 mg/dL   Creatinine, Ser 0.87 0.44 - 1.00 mg/dL   Calcium 9.0 8.9 - 10.3 mg/dL   GFR, Estimated >60 >60 mL/min    Comment: (NOTE) Calculated using the CKD-EPI Creatinine Equation (2021)    Anion gap 6 5 - 15    Comment: Performed at [facility], [address] [place], [place] [address]  Heparin level (unfractionated)     Status: None   Collection Time: 08/20/21  5:35 AM  Result Value Ref Range   Heparin Unfractionated 0.54 0.30 - 0.70 IU/mL    Comment: (NOTE) The clinical reportable range upper limit is being lowered to >1.10 to align with the FDA approved guidance for the current laboratory assay.  If heparin results are below expected values, and patient dosage has  been confirmed, suggest follow up testing of antithrombin III levels. Performed at [facility], [address] [place], [place] [address]   CBC     Status: Abnormal   Collection Time: 08/20/21  5:35 AM  Result Value Ref Range   WBC 7.3 4.0 - 10.5 K/uL   RBC 3.70 (L) 3.87 - 5.11 MIL/uL   Hemoglobin 12.0 12.0 - 15.0 g/dL   HCT 36.9 36.0 - 46.0 %   MCV 99.7 80.0 - 100.0 fL   MCH 32.4 26.0 - 34.0 pg   MCHC 32.5 30.0 - 36.0 g/dL   RDW 13.5 11.5 -  15.5 %   Platelets 143 (L) 150 - 400 K/uL   nRBC 0.0 0.0 - 0.2 %    Comment: Performed at [facility], [address] [place], [place] [address]  Basic metabolic panel     Status: Abnormal   Collection Time: 08/21/21  5:55 AM  Result Value Ref Range   Sodium 138 135 - 145 mmol/L   Potassium 3.9 3.5 - 5.1 mmol/L   Chloride 107 98 - 111 mmol/L   CO2 28 22 - 32 mmol/L   Glucose, Bld 103 (H) 70 - 99 mg/dL    Comment: Glucose reference range applies only to samples taken after fasting for at least 8 hours.   BUN 17 8 - 23 mg/dL   Creatinine, Ser 0.84 0.44 - 1.00 mg/dL   Calcium 8.7 (L) 8.9 - 10.3 mg/dL   GFR, Estimated >60 >60 mL/min    Comment: (NOTE) Calculated using the  CKD-EPI Creatinine Equation (2021)    Anion gap 3 (L) 5 - 15    Comment: Performed at [facility], [address] [place], [place] [address]  Basic metabolic panel     Status: Abnormal   Collection Time: 08/22/21  4:55 AM  Result Value Ref Range   Sodium 138 135 - 145 mmol/L   Potassium 3.9 3.5 - 5.1 mmol/L   Chloride 106 98 - 111 mmol/L   CO2 24 22 - 32 mmol/L   Glucose, Bld 128 (H) 70 - 99 mg/dL    Comment: Glucose reference range applies only to samples taken after fasting for at least 8 hours.   BUN 17 8 - 23 mg/dL   Creatinine, Ser 0.80 0.44 - 1.00 mg/dL   Calcium 9.0 8.9 - 10.3 mg/dL   GFR, Estimated >60 >60 mL/min    Comment: (NOTE) Calculated using the CKD-EPI Creatinine Equation (2021)    Anion gap 8 5 - 15    Comment: Performed at [facility], [address] [place], [place] [address]  Basic metabolic panel     Status: Abnormal   Collection Time: 08/23/21  5:00 AM  Result Value Ref Range   Sodium 137 135 - 145 mmol/L   Potassium 4.3 3.5 - 5.1 mmol/L   Chloride 105 98 - 111 mmol/L   CO2 25 22 - 32 mmol/L   Glucose, Bld 132 (H) 70 - 99 mg/dL    Comment: Glucose reference range applies only to samples taken after fasting for at least 8 hours.   BUN 16 8 - 23 mg/dL   Creatinine, Ser 0.86 0.44 - 1.00 mg/dL   Calcium 8.8 (L) 8.9 - 10.3 mg/dL   GFR, Estimated >60 >60 mL/min    Comment: (NOTE) Calculated using the CKD-EPI Creatinine Equation (2021)    Anion gap 7 5 - 15    Comment: Performed at [facility], [address] [place], [place] [address]  Comprehensive metabolic panel     Status: Abnormal   Collection Time: 09/09/21  2:02 PM  Result Value Ref Range   Sodium 142 135 - 145 mmol/L   Potassium 3.8 3.5 - 5.1 mmol/L   Chloride 105 98 - 111 mmol/L   CO2 27 22 - 32 mmol/L   Glucose, Bld 175 (H) 70 - 99 mg/dL    Comment: Glucose reference range applies only to samples taken after fasting for at least 8 hours.   BUN 27 (H) 8 - 23  mg/dL   Creatinine, Ser 1.14 (H) 0.44 -  1.00 mg/dL   Calcium 9.8 8.9 - 10.3 mg/dL   Total Protein 7.2 6.5 - 8.1 g/dL   Albumin 3.5 3.5 - 5.0 g/dL   AST 34 15 - 41 U/L   ALT 22 0 - 44 U/L   Alkaline Phosphatase 145 (H) 38 - 126 U/L   Total Bilirubin 1.5 (H) 0.3 - 1.2 mg/dL   GFR, Estimated 46 (L) >60 mL/min    Comment: (NOTE) Calculated using the CKD-EPI Creatinine Equation (2021)    Anion gap 10 5 - 15    Comment: Performed at [facility], [address] [place], [place] [address]  Troponin I (High Sensitivity)     Status: Abnormal   Collection Time: 09/09/21  2:02 PM  Result Value Ref Range   Troponin I (High Sensitivity) 273 (HH) <18 ng/L    Comment: CRITICAL VALUE NOTED. VALUE IS CONSISTENT WITH PREVIOUSLY REPORTED/CALLED VALUE  SB (NOTE) Elevated high sensitivity troponin I (hsTnI) values and significant  changes across serial measurements may suggest ACS but many other  chronic and acute conditions are known to elevate hsTnI results.  Refer to the "Links" section for chest pain algorithms and additional  guidance. Performed at [facility], [address] [place], [place] [address]   CBC with Differential     Status: Abnormal   Collection Time: 09/09/21  2:02 PM  Result Value Ref Range   WBC 7.9 4.0 - 10.5 K/uL   RBC 3.72 (L) 3.87 - 5.11 MIL/uL   Hemoglobin 12.1 12.0 - 15.0 g/dL   HCT 36.7 36.0 - 46.0 %   MCV 98.7 80.0 - 100.0 fL   MCH 32.5 26.0 - 34.0 pg   MCHC 33.0 30.0 - 36.0 g/dL   RDW 14.9 11.5 - 15.5 %   Platelets 246 150 - 400 K/uL   nRBC 0.0 0.0 - 0.2 %   Neutrophils Relative % 75 %   Neutro Abs 5.9 1.7 - 7.7 K/uL   Lymphocytes Relative 13 %   Lymphs Abs 1.1 0.7 - 4.0 K/uL   Monocytes Relative 10 %   Monocytes Absolute 0.8 0.1 - 1.0 K/uL   Eosinophils Relative 1 %   Eosinophils Absolute 0.1 0.0 - 0.5 K/uL   Basophils Relative 1 %   Basophils Absolute 0.0 0.0 - 0.1 K/uL   Immature Granulocytes 0 %   Abs Immature Granulocytes 0.03  0.00 - 0.07 K/uL    Comment: Performed at [facility], [address] [place], [place] [address]  Brain natriuretic peptide     Status: Abnormal   Collection Time: 09/09/21  2:02 PM  Result Value Ref Range   B Natriuretic Peptide 1,905.6 (H) 0.0 - 100.0 pg/mL    Comment: Performed at [facility], [address] [place], [place] [address]  Troponin I (High Sensitivity)     Status: Abnormal   Collection Time: 09/09/21  5:49 PM  Result Value Ref Range   Troponin I (High Sensitivity) 341 (HH) <18 ng/L    Comment: CRITICAL RESULT CALLED TO, READ BACK BY AND VERIFIED WITH JULIA SHEEHAN RN AT 9833 09/09/2021 GAA (NOTE) Elevated high sensitivity troponin I (hsTnI) values and significant  changes across serial measurements may suggest ACS but many other  chronic and acute conditions are known to elevate hsTnI results.  Refer to the "Links" section for chest pain algorithms and additional  guidance. Performed at [facility], [address] [place], [place] [address]   Urinalysis, Routine w reflex microscopic  Urine, Clean Catch     Status: Abnormal   Collection Time: 09/09/21  6:51 PM  Result Value Ref Range   Color, Urine YELLOW (A) YELLOW   APPearance CLEAR (A) CLEAR   Specific Gravity, Urine 1.013 1.005 - 1.030   pH 6.0 5.0 - 8.0   Glucose, UA NEGATIVE NEGATIVE mg/dL   Hgb urine dipstick SMALL (A) NEGATIVE   Bilirubin Urine NEGATIVE NEGATIVE   Ketones, ur NEGATIVE NEGATIVE mg/dL   Protein, ur 100 (A) NEGATIVE mg/dL   Nitrite NEGATIVE NEGATIVE   Leukocytes,Ua NEGATIVE NEGATIVE   RBC / HPF 0-5 0 - 5 RBC/hpf   WBC, UA 0-5 0 - 5 WBC/hpf   Bacteria, UA NONE SEEN NONE SEEN   Squamous Epithelial / LPF 0-5 0 - 5    Comment: Performed at [facility], [address], [place], [place] [address]  Resp Panel by RT-PCR (Flu A&B, Covid) Nasopharyngeal Swab     Status: None   Collection Time: 09/09/21  6:51 PM   Specimen: Nasopharyngeal Swab;  Nasopharyngeal(NP) swabs in vial transport medium  Result Value Ref Range   SARS Coronavirus 2 by RT PCR NEGATIVE NEGATIVE    Comment: (NOTE) SARS-CoV-2 target nucleic acids are NOT DETECTED.  The SARS-CoV-2 RNA is generally detectable in upper respiratory specimens during the acute phase of infection. The lowest concentration of SARS-CoV-2 viral copies this assay can detect is 138 copies/mL. A negative result does not preclude SARS-Cov-2 infection and should not be used as the sole basis for treatment or other patient management decisions. A negative result may occur with  improper specimen collection/handling, submission of specimen other than nasopharyngeal swab, presence of viral mutation(s) within the areas targeted by this assay, and inadequate number of viral copies(<138 copies/mL). A negative result must be combined with clinical observations, patient history, and epidemiological information. The expected result is Negative.  Fact Sheet for Patients:  EntrepreneurPulse.com.au  Fact Sheet for Healthcare Providers:  IncredibleEmployment.be  This test is no t yet approved or cleared by the [place] FDA and  has been authorized for detection and/or diagnosis of SARS-CoV-2 by FDA under an Emergency Use Authorization (EUA). This EUA will remain  in effect (meaning this test can be used) for the duration of the COVID-19 declaration under Section 564(b)(1) of the Act, 21 U.S.C.section 360bbb-3(b)(1), unless the authorization is terminated  or revoked sooner.       Influenza A by PCR NEGATIVE NEGATIVE   Influenza B by PCR NEGATIVE NEGATIVE    Comment: (NOTE) The Xpert Xpress SARS-CoV-2/FLU/RSV plus assay is intended as an aid in the diagnosis of influenza from Nasopharyngeal swab specimens and should not be used as a sole basis for treatment. Nasal washings and aspirates are unacceptable for Xpert Xpress  SARS-CoV-2/FLU/RSV testing.  Fact Sheet for Patients: EntrepreneurPulse.com.au  Fact Sheet for Healthcare Providers: IncredibleEmployment.be  This test is not yet approved or cleared by the [place] FDA and has been authorized for detection and/or diagnosis of SARS-CoV-2 by FDA under an Emergency Use Authorization (EUA). This EUA will remain in effect (meaning this test can be used) for the duration of the COVID-19 declaration under Section 564(b)(1) of the Act, 21 U.S.C. section 360bbb-3(b)(1), unless the authorization is terminated or revoked.  Performed at [facility], [address] [place], [place] [address]   Vitamin B12     Status: Abnormal   Collection Time: 09/09/21  7:56 PM  Result Value Ref Range   Vitamin B-12 1,977 (H)  180 - 914 pg/mL    Comment: (NOTE) This assay is not validated for testing neonatal or myeloproliferative syndrome specimens for Vitamin B12 levels. Performed at [facility], [place] [address], [place], [place] [address]   Magnesium     Status: None   Collection Time: 09/09/21  7:56 PM  Result Value Ref Range   Magnesium 2.2 1.7 - 2.4 mg/dL    Comment: Performed at [facility], [address] [place], [place] [address]  Phosphorus     Status: Abnormal   Collection Time: 09/09/21  7:56 PM  Result Value Ref Range   Phosphorus 2.0 (L) 2.5 - 4.6 mg/dL    Comment: HEMOLYSIS AT THIS LEVEL MAY AFFECT RESULT Performed at [facility], [address] [place], [place] [address]   Procalcitonin - Baseline     Status: None   Collection Time: 09/09/21  7:56 PM  Result Value Ref Range   Procalcitonin <0.10 ng/mL    Comment:        Interpretation: PCT (Procalcitonin) <= 0.5 ng/mL: Systemic infection (sepsis) is not likely. Local bacterial infection is possible. (NOTE)       Sepsis PCT Algorithm           Lower Respiratory Tract                                      Infection  PCT Algorithm    ----------------------------     ----------------------------         PCT < 0.25 ng/mL                PCT < 0.10 ng/mL          Strongly encourage             Strongly discourage   discontinuation of antibiotics    initiation of antibiotics    ----------------------------     -----------------------------       PCT 0.25 - 0.50 ng/mL            PCT 0.10 - 0.25 ng/mL               OR       >80% decrease in PCT            Discourage initiation of                                            antibiotics      Encourage discontinuation           of antibiotics    ----------------------------     -----------------------------         PCT >= 0.50 ng/mL              PCT 0.26 - 0.50 ng/mL               AND        <80% decrease in PCT             Encourage initiation of                                             antibiotics  Encourage continuation           of antibiotics    ----------------------------     -----------------------------        PCT >= 0.50 ng/mL                  PCT > 0.50 ng/mL               AND         increase in PCT                  Strongly encourage                                      initiation of antibiotics    Strongly encourage escalation           of antibiotics                                     -----------------------------                                           PCT <= 0.25 ng/mL                                                 OR                                        > 80% decrease in PCT                                      Discontinue / Do not initiate                                             antibiotics  Performed at [facility], [address] [place], [place] [address]   APTT     Status: None   Collection Time: 09/09/21  7:56 PM  Result Value Ref Range   aPTT 28 24 - 36 seconds    Comment: Performed at [facility], [address] [place], [place] [address]  Protime-INR     Status: None    Collection Time: 09/09/21  7:56 PM  Result Value Ref Range   Prothrombin Time 14.0 11.4 - 15.2 seconds   INR 1.1 0.8 - 1.2    Comment: (NOTE) INR goal varies based on device and disease states. Performed at [facility], [place], [place] [address]   Troponin I (High Sensitivity)     Status: Abnormal   Collection Time: 09/09/21  7:56 PM  Result Value Ref Range   Troponin I (High Sensitivity) 284 (HH) <18 ng/L    Comment: CRITICAL VALUE NOTED. VALUE IS CONSISTENT WITH PREVIOUSLY REPORTED/CALLED VALUE GAA (NOTE) Elevated high sensitivity troponin I (  hsTnI) values and significant  changes across serial measurements may suggest ACS but many other  chronic and acute conditions are known to elevate hsTnI results.  Refer to the "Links" section for chest pain algorithms and additional  guidance. Performed at [facility], [address] [place], [place] [address]   Basic metabolic panel     Status: Abnormal   Collection Time: 09/10/21  6:47 AM  Result Value Ref Range   Sodium 141 135 - 145 mmol/L   Potassium 2.9 (L) 3.5 - 5.1 mmol/L   Chloride 101 98 - 111 mmol/L   CO2 31 22 - 32 mmol/L   Glucose, Bld 122 (H) 70 - 99 mg/dL    Comment: Glucose reference range applies only to samples taken after fasting for at least 8 hours.   BUN 25 (H) 8 - 23 mg/dL   Creatinine, Ser 1.21 (H) 0.44 - 1.00 mg/dL   Calcium 9.1 8.9 - 10.3 mg/dL   GFR, Estimated 43 (L) >60 mL/min    Comment: (NOTE) Calculated using the CKD-EPI Creatinine Equation (2021)    Anion gap 9 5 - 15    Comment: Performed at [facility], [address] [place], [place] [address]  CBC     Status: Abnormal   Collection Time: 09/10/21  6:47 AM  Result Value Ref Range   WBC 7.7 4.0 - 10.5 K/uL   RBC 3.65 (L) 3.87 - 5.11 MIL/uL   Hemoglobin 12.1 12.0 - 15.0 g/dL   HCT 35.8 (L) 36.0 - 46.0 %   MCV 98.1 80.0 - 100.0 fL   MCH 33.2 26.0 - 34.0 pg   MCHC 33.8 30.0 - 36.0 g/dL   RDW  15.0 11.5 - 15.5 %   Platelets 234 150 - 400 K/uL   nRBC 0.0 0.0 - 0.2 %    Comment: Performed at [facility], [place], [place] [address]  Heparin level (unfractionated)     Status: None   Collection Time: 09/10/21  6:47 AM  Result Value Ref Range   Heparin Unfractionated 0.49 0.30 - 0.70 IU/mL    Comment: (NOTE) The clinical reportable range upper limit is being lowered to >1.10 to align with the FDA approved guidance for the current laboratory assay.  If heparin results are below expected values, and patient dosage has  been confirmed, suggest follow up testing of antithrombin III levels. Performed at [facility], [place], [place] [address]   Heparin level (unfractionated)     Status: None   Collection Time: 09/10/21  4:02 PM  Result Value Ref Range   Heparin Unfractionated 0.45 0.30 - 0.70 IU/mL    Comment: (NOTE) The clinical reportable range upper limit is being lowered to >1.10 to align with the FDA approved guidance for the current laboratory assay.  If heparin results are below expected values, and patient dosage has  been confirmed, suggest follow up testing of antithrombin III levels. Performed at [facility], [place], [place] [address]   Heparin level (unfractionated)     Status: None   Collection Time: 09/11/21  5:32 AM  Result Value Ref Range   Heparin Unfractionated 0.48 0.30 - 0.70 IU/mL    Comment: (NOTE) The clinical reportable range upper limit is being lowered to >1.10 to align with the FDA approved guidance for the current laboratory assay.  If heparin results are below expected values, and patient dosage has  been confirmed, suggest follow up  testing of antithrombin III levels. Performed at [facility], [address] [place], [place] [address]   CBC with Differential/Platelet     Status: None   Collection Time: 09/11/21  5:32 AM  Result Value Ref Range    WBC 9.4 4.0 - 10.5 K/uL   RBC 3.99 3.87 - 5.11 MIL/uL   Hemoglobin 13.3 12.0 - 15.0 g/dL   HCT 38.7 36.0 - 46.0 %   MCV 97.0 80.0 - 100.0 fL   MCH 33.3 26.0 - 34.0 pg   MCHC 34.4 30.0 - 36.0 g/dL   RDW 15.1 11.5 - 15.5 %   Platelets 272 150 - 400 K/uL   nRBC 0.0 0.0 - 0.2 %   Neutrophils Relative % 65 %   Neutro Abs 6.1 1.7 - 7.7 K/uL   Lymphocytes Relative 21 %   Lymphs Abs 2.0 0.7 - 4.0 K/uL   Monocytes Relative 11 %   Monocytes Absolute 1.0 0.1 - 1.0 K/uL   Eosinophils Relative 2 %   Eosinophils Absolute 0.2 0.0 - 0.5 K/uL   Basophils Relative 1 %   Basophils Absolute 0.1 0.0 - 0.1 K/uL   Immature Granulocytes 0 %   Abs Immature Granulocytes 0.04 0.00 - 0.07 K/uL    Comment: Performed at [facility], [address] [place], [place] [address]  Comprehensive metabolic panel     Status: Abnormal   Collection Time: 09/11/21  5:32 AM  Result Value Ref Range   Sodium 140 135 - 145 mmol/L   Potassium 3.5 3.5 - 5.1 mmol/L   Chloride 102 98 - 111 mmol/L   CO2 27 22 - 32 mmol/L   Glucose, Bld 152 (H) 70 - 99 mg/dL    Comment: Glucose reference range applies only to samples taken after fasting for at least 8 hours.   BUN 25 (H) 8 - 23 mg/dL   Creatinine, Ser 1.33 (H) 0.44 - 1.00 mg/dL   Calcium 9.2 8.9 - 10.3 mg/dL   Total Protein 7.7 6.5 - 8.1 g/dL   Albumin 3.6 3.5 - 5.0 g/dL   AST 36 15 - 41 U/L   ALT 22 0 - 44 U/L   Alkaline Phosphatase 143 (H) 38 - 126 U/L   Total Bilirubin 1.9 (H) 0.3 - 1.2 mg/dL   GFR, Estimated 38 (L) >60 mL/min    Comment: (NOTE) Calculated using the CKD-EPI Creatinine Equation (2021)    Anion gap 11 5 - 15    Comment: Performed at [facility], [address], [place], [place] [address]  Magnesium     Status: None   Collection Time: 09/11/21  5:32 AM  Result Value Ref Range   Magnesium 2.2 1.7 - 2.4 mg/dL    Comment: Performed at [facility], [address] [place], [place] [address]  Phosphorus     Status:  Abnormal   Collection Time: 09/11/21  5:32 AM  Result Value Ref Range   Phosphorus 2.3 (L) 2.5 - 4.6 mg/dL    Comment: Performed at [facility], [place], [place] [address]  Heparin level (unfractionated)     Status: None   Collection Time: 09/12/21  4:57 AM  Result Value Ref Range   Heparin Unfractionated 0.53 0.30 - 0.70 IU/mL    Comment: (NOTE) The clinical reportable range upper limit is being lowered to >1.10 to align with the FDA approved guidance for the current laboratory assay.  If heparin results are below expected values, and  patient dosage has  been confirmed, suggest follow up testing of antithrombin III levels. Performed at [facility], [address] [place], [place] [address]   CBC with Differential/Platelet     Status: Abnormal   Collection Time: 09/12/21  4:57 AM  Result Value Ref Range   WBC 8.4 4.0 - 10.5 K/uL   RBC 3.67 (L) 3.87 - 5.11 MIL/uL   Hemoglobin 12.0 12.0 - 15.0 g/dL   HCT 35.9 (L) 36.0 - 46.0 %   MCV 97.8 80.0 - 100.0 fL   MCH 32.7 26.0 - 34.0 pg   MCHC 33.4 30.0 - 36.0 g/dL   RDW 15.2 11.5 - 15.5 %   Platelets 243 150 - 400 K/uL   nRBC 0.0 0.0 - 0.2 %   Neutrophils Relative % 62 %   Neutro Abs 5.3 1.7 - 7.7 K/uL   Lymphocytes Relative 23 %   Lymphs Abs 1.9 0.7 - 4.0 K/uL   Monocytes Relative 12 %   Monocytes Absolute 1.0 0.1 - 1.0 K/uL   Eosinophils Relative 2 %   Eosinophils Absolute 0.2 0.0 - 0.5 K/uL   Basophils Relative 1 %   Basophils Absolute 0.1 0.0 - 0.1 K/uL   Immature Granulocytes 0 %   Abs Immature Granulocytes 0.03 0.00 - 0.07 K/uL    Comment: Performed at [facility], [address] [place], [place] [address]  Comprehensive metabolic panel     Status: Abnormal   Collection Time: 09/12/21  4:57 AM  Result Value Ref Range   Sodium 140 135 - 145 mmol/L   Potassium 3.0 (L) 3.5 - 5.1 mmol/L   Chloride 99 98 - 111 mmol/L   CO2 32 22 - 32 mmol/L   Glucose, Bld 117 (H) 70 - 99  mg/dL    Comment: Glucose reference range applies only to samples taken after fasting for at least 8 hours.   BUN 27 (H) 8 - 23 mg/dL   Creatinine, Ser 1.42 (H) 0.44 - 1.00 mg/dL   Calcium 9.1 8.9 - 10.3 mg/dL   Total Protein 7.1 6.5 - 8.1 g/dL   Albumin 3.4 (L) 3.5 - 5.0 g/dL   AST 67 (H) 15 - 41 U/L   ALT 22 0 - 44 U/L   Alkaline Phosphatase 129 (H) 38 - 126 U/L   Total Bilirubin 1.7 (H) 0.3 - 1.2 mg/dL   GFR, Estimated 35 (L) >60 mL/min    Comment: (NOTE) Calculated using the CKD-EPI Creatinine Equation (2021)    Anion gap 9 5 - 15    Comment: Performed at [facility], [address] [place], [place] [address]  Magnesium     Status: None   Collection Time: 09/12/21  4:57 AM  Result Value Ref Range   Magnesium 2.2 1.7 - 2.4 mg/dL    Comment: Performed at [facility], [address], [place], [place] [address]  Phosphorus     Status: None   Collection Time: 09/12/21  4:57 AM  Result Value Ref Range   Phosphorus 3.4 2.5 - 4.6 mg/dL    Comment: Performed at [facility], [place], [place] [address]  Troponin I (High Sensitivity)     Status: Abnormal   Collection Time: 09/12/21  4:57 AM  Result Value Ref Range   Troponin I (High Sensitivity) 7,376 (HH) <18 ng/L    Comment: CRITICAL VALUE NOTED. VALUE IS CONSISTENT WITH PREVIOUSLY REPORTED/CALLED VALUE DLB (NOTE) Elevated high sensitivity troponin I (hsTnI) values  and significant  changes across serial measurements may suggest ACS but many other  chronic and acute conditions are known to elevate hsTnI results.  Refer to the "Links" section for chest pain algorithms and additional  guidance. Performed at [facility], [address] [place], [place] [address]   POCT Activated clotting time     Status: None   Collection Time: 09/12/21 10:17 AM  Result Value Ref Range   Activated Clotting Time 433 seconds    Comment: Reference range 74-137 seconds for patients not on  anticoagulant therapy.  Glucose, capillary     Status: Abnormal   Collection Time: 09/12/21  8:59 PM  Result Value Ref Range   Glucose-Capillary 117 (H) 70 - 99 mg/dL    Comment: Glucose reference range applies only to samples taken after fasting for at least 8 hours.  CBC with Differential/Platelet     Status: Abnormal   Collection Time: 09/13/21  5:55 AM  Result Value Ref Range   WBC 9.0 4.0 - 10.5 K/uL   RBC 3.68 (L) 3.87 - 5.11 MIL/uL   Hemoglobin 12.0 12.0 - 15.0 g/dL   HCT 36.6 36.0 - 46.0 %   MCV 99.5 80.0 - 100.0 fL   MCH 32.6 26.0 - 34.0 pg   MCHC 32.8 30.0 - 36.0 g/dL   RDW 15.5 11.5 - 15.5 %   Platelets 205 150 - 400 K/uL   nRBC 0.0 0.0 - 0.2 %   Neutrophils Relative % 71 %   Neutro Abs 6.4 1.7 - 7.7 K/uL   Lymphocytes Relative 15 %   Lymphs Abs 1.3 0.7 - 4.0 K/uL   Monocytes Relative 12 %   Monocytes Absolute 1.1 (H) 0.1 - 1.0 K/uL   Eosinophils Relative 2 %   Eosinophils Absolute 0.2 0.0 - 0.5 K/uL   Basophils Relative 0 %   Basophils Absolute 0.0 0.0 - 0.1 K/uL   Immature Granulocytes 0 %   Abs Immature Granulocytes 0.03 0.00 - 0.07 K/uL    Comment: Performed at [facility], [address] [place], [place] [address]  Comprehensive metabolic panel     Status: Abnormal   Collection Time: 09/13/21  5:55 AM  Result Value Ref Range   Sodium 141 135 - 145 mmol/L   Potassium 4.5 3.5 - 5.1 mmol/L   Chloride 106 98 - 111 mmol/L   CO2 24 22 - 32 mmol/L   Glucose, Bld 121 (H) 70 - 99 mg/dL    Comment: Glucose reference range applies only to samples taken after fasting for at least 8 hours.   BUN 24 (H) 8 - 23 mg/dL   Creatinine, Ser 1.32 (H) 0.44 - 1.00 mg/dL   Calcium 8.9 8.9 - 10.3 mg/dL   Total Protein 7.0 6.5 - 8.1 g/dL   Albumin 3.3 (L) 3.5 - 5.0 g/dL   AST 66 (H) 15 - 41 U/L   ALT 21 0 - 44 U/L   Alkaline Phosphatase 123 38 - 126 U/L   Total Bilirubin 1.9 (H) 0.3 - 1.2 mg/dL   GFR, Estimated 39 (L) >60 mL/min    Comment: (NOTE) Calculated using  the CKD-EPI Creatinine Equation (2021)    Anion gap 11 5 - 15    Comment: Performed at [facility], [address] [place], [place] [address]  Magnesium     Status: None   Collection Time: 09/13/21  5:55 AM  Result Value Ref Range   Magnesium 2.3 1.7 - 2.4 mg/dL  Comment: Performed at [facility], [address] [place], [place] [address]  Phosphorus     Status: None   Collection Time: 09/13/21  5:55 AM  Result Value Ref Range   Phosphorus 2.6 2.5 - 4.6 mg/dL    Comment: Performed at [facility], [address] [place], [place] [address]  Brain natriuretic peptide     Status: Abnormal   Collection Time: 09/13/21  6:36 PM  Result Value Ref Range   B Natriuretic Peptide 1,844.0 (H) 0.0 - 100.0 pg/mL    Comment: Performed at [facility], [address] [place], [place] [address]  CBC with Differential/Platelet     Status: Abnormal   Collection Time: 09/14/21  5:10 AM  Result Value Ref Range   WBC 11.8 (H) 4.0 - 10.5 K/uL   RBC 3.42 (L) 3.87 - 5.11 MIL/uL   Hemoglobin 11.2 (L) 12.0 - 15.0 g/dL   HCT 34.2 (L) 36.0 - 46.0 %   MCV 100.0 80.0 - 100.0 fL   MCH 32.7 26.0 - 34.0 pg   MCHC 32.7 30.0 - 36.0 g/dL   RDW 15.8 (H) 11.5 - 15.5 %   Platelets 192 150 - 400 K/uL   nRBC 0.0 0.0 - 0.2 %   Neutrophils Relative % 76 %   Neutro Abs 8.9 (H) 1.7 - 7.7 K/uL   Lymphocytes Relative 12 %   Lymphs Abs 1.4 0.7 - 4.0 K/uL   Monocytes Relative 11 %   Monocytes Absolute 1.3 (H) 0.1 - 1.0 K/uL   Eosinophils Relative 1 %   Eosinophils Absolute 0.2 0.0 - 0.5 K/uL   Basophils Relative 0 %   Basophils Absolute 0.0 0.0 - 0.1 K/uL   Immature Granulocytes 0 %   Abs Immature Granulocytes 0.05 0.00 - 0.07 K/uL    Comment: Performed at [facility], [address] [place], [place] [address]  Comprehensive metabolic panel     Status: Abnormal   Collection Time: 09/14/21  5:10 AM  Result Value Ref Range   Sodium 141 135 - 145 mmol/L    Potassium 3.8 3.5 - 5.1 mmol/L   Chloride 105 98 - 111 mmol/L   CO2 26 22 - 32 mmol/L   Glucose, Bld 112 (H) 70 - 99 mg/dL    Comment: Glucose reference range applies only to samples taken after fasting for at least 8 hours.   BUN 25 (H) 8 - 23 mg/dL   Creatinine, Ser 1.28 (H) 0.44 - 1.00 mg/dL   Calcium 8.9 8.9 - 10.3 mg/dL   Total Protein 7.0 6.5 - 8.1 g/dL   Albumin 3.3 (L) 3.5 - 5.0 g/dL   AST 44 (H) 15 - 41 U/L   ALT 19 0 - 44 U/L   Alkaline Phosphatase 119 38 - 126 U/L   Total Bilirubin 1.6 (H) 0.3 - 1.2 mg/dL   GFR, Estimated 40 (L) >60 mL/min    Comment: (NOTE) Calculated using the CKD-EPI Creatinine Equation (2021)    Anion gap 10 5 - 15    Comment: Performed at [facility], [address] [place], [place] [address]  Magnesium     Status: Abnormal   Collection Time: 09/14/21  5:10 AM  Result Value Ref Range   Magnesium 2.5 (H) 1.7 - 2.4 mg/dL    Comment: Performed at [facility], [address], [place], [place] [address]  Phosphorus     Status: None   Collection Time: 09/14/21  5:10 AM  Result Value Ref Range   Phosphorus 2.9 2.5 - 4.6 mg/dL    Comment: Performed at [facility], [address] [place], [place] [address]  Brain natriuretic peptide     Status: Abnormal   Collection Time: 09/14/21  5:10 AM  Result Value Ref Range   B Natriuretic Peptide 1,716.2 (H) 0.0 - 100.0 pg/mL    Comment: Performed at [facility], [address] [place], [place] [address]  Procalcitonin - Baseline     Status: None   Collection Time: 09/14/21  5:10 AM  Result Value Ref Range   Procalcitonin 0.11 ng/mL    Comment:        Interpretation: PCT (Procalcitonin) <= 0.5 ng/mL: Systemic infection (sepsis) is not likely. Local bacterial infection is possible. (NOTE)       Sepsis PCT Algorithm           Lower Respiratory Tract                                      Infection PCT Algorithm    ----------------------------      ----------------------------         PCT < 0.25 ng/mL                PCT < 0.10 ng/mL          Strongly encourage             Strongly discourage   discontinuation of antibiotics    initiation of antibiotics    ----------------------------     -----------------------------       PCT 0.25 - 0.50 ng/mL            PCT 0.10 - 0.25 ng/mL               OR       >80% decrease in PCT            Discourage initiation of                                            antibiotics      Encourage discontinuation           of antibiotics    ----------------------------     -----------------------------         PCT >= 0.50 ng/mL              PCT 0.26 - 0.50 ng/mL               AND        <80% decrease in PCT             Encourage initiation of                                             antibiotics       Encourage continuation           of antibiotics    ----------------------------     -----------------------------        PCT >= 0.50 ng/mL                  PCT > 0.50 ng/mL  AND         increase in PCT                  Strongly encourage                                      initiation of antibiotics    Strongly encourage escalation           of antibiotics                                     -----------------------------                                           PCT <= 0.25 ng/mL                                                 OR                                        > 80% decrease in PCT                                      Discontinue / Do not initiate                                             antibiotics  Performed at [facility], [address] [place], [place] [address]   Basic metabolic panel     Status: Abnormal   Collection Time: 09/15/21  5:26 AM  Result Value Ref Range   Sodium 138 135 - 145 mmol/L   Potassium 3.4 (L) 3.5 - 5.1 mmol/L   Chloride 103 98 - 111 mmol/L   CO2 28 22 - 32 mmol/L   Glucose, Bld 109 (H) 70 - 99 mg/dL    Comment: Glucose  reference range applies only to samples taken after fasting for at least 8 hours.   BUN 25 (H) 8 - 23 mg/dL   Creatinine, Ser 1.33 (H) 0.44 - 1.00 mg/dL   Calcium 8.5 (L) 8.9 - 10.3 mg/dL   GFR, Estimated 38 (L) >60 mL/min    Comment: (NOTE) Calculated using the CKD-EPI Creatinine Equation (2021)    Anion gap 7 5 - 15    Comment: Performed at [facility], [address] [place], [place] [address]  Magnesium     Status: None   Collection Time: 09/15/21  5:26 AM  Result Value Ref Range   Magnesium 2.2 1.7 - 2.4 mg/dL    Comment: Performed at [facility], [address] [place], [place] [address]  Basic metabolic panel     Status: Abnormal   Collection Time: 09/16/21  6:23 AM  Result Value Ref Range   Sodium 138 135 - 145  mmol/L   Potassium 3.9 3.5 - 5.1 mmol/L   Chloride 103 98 - 111 mmol/L   CO2 27 22 - 32 mmol/L   Glucose, Bld 111 (H) 70 - 99 mg/dL    Comment: Glucose reference range applies only to samples taken after fasting for at least 8 hours.   BUN 24 (H) 8 - 23 mg/dL   Creatinine, Ser 1.19 (H) 0.44 - 1.00 mg/dL   Calcium 8.9 8.9 - 10.3 mg/dL   GFR, Estimated 44 (L) >60 mL/min    Comment: (NOTE) Calculated using the CKD-EPI Creatinine Equation (2021)    Anion gap 8 5 - 15    Comment: Performed at [facility], [address], [place], [place] [address]  Magnesium     Status: None   Collection Time: 09/16/21  6:23 AM  Result Value Ref Range   Magnesium 2.3 1.7 - 2.4 mg/dL    Comment: Performed at [facility], [address], [place], [place] [address]  Resp Panel by RT-PCR (Flu A&B, Covid) Nasopharyngeal Swab     Status: None   Collection Time: 09/16/21  1:52 PM   Specimen: Nasopharyngeal Swab; Nasopharyngeal(NP) swabs in vial transport medium  Result Value Ref Range   SARS Coronavirus 2 by RT PCR NEGATIVE NEGATIVE    Comment: (NOTE) SARS-CoV-2 target nucleic acids are NOT DETECTED.  The SARS-CoV-2 RNA is generally  detectable in upper respiratory specimens during the acute phase of infection. The lowest concentration of SARS-CoV-2 viral copies this assay can detect is 138 copies/mL. A negative result does not preclude SARS-Cov-2 infection and should not be used as the sole basis for treatment or other patient management decisions. A negative result may occur with  improper specimen collection/handling, submission of specimen other than nasopharyngeal swab, presence of viral mutation(s) within the areas targeted by this assay, and inadequate number of viral copies(<138 copies/mL). A negative result must be combined with clinical observations, patient history, and epidemiological information. The expected result is Negative.  Fact Sheet for Patients:  EntrepreneurPulse.com.au  Fact Sheet for Healthcare Providers:  IncredibleEmployment.be  This test is no t yet approved or cleared by the [place] FDA and  has been authorized for detection and/or diagnosis of SARS-CoV-2 by FDA under an Emergency Use Authorization (EUA). This EUA will remain  in effect (meaning this test can be used) for the duration of the COVID-19 declaration under Section 564(b)(1) of the Act, 21 U.S.C.section 360bbb-3(b)(1), unless the authorization is terminated  or revoked sooner.       Influenza A by PCR NEGATIVE NEGATIVE   Influenza B by PCR NEGATIVE NEGATIVE    Comment: (NOTE) The Xpert Xpress SARS-CoV-2/FLU/RSV plus assay is intended as an aid in the diagnosis of influenza from Nasopharyngeal swab specimens and should not be used as a sole basis for treatment. Nasal washings and aspirates are unacceptable for Xpert Xpress SARS-CoV-2/FLU/RSV testing.  Fact Sheet for Patients: EntrepreneurPulse.com.au  Fact Sheet for Healthcare Providers: IncredibleEmployment.be  This test is not yet approved or cleared by the [place] FDA and has  been authorized for detection and/or diagnosis of SARS-CoV-2 by FDA under an Emergency Use Authorization (EUA). This EUA will remain in effect (meaning this test can be used) for the duration of the COVID-19 declaration under Section 564(b)(1) of the Act, 21 U.S.C. section 360bbb-3(b)(1), unless the authorization is terminated or revoked.  Performed at [facility], [address], [place], [place] [address]   Basic metabolic panel  Status: Abnormal   Collection Time: 09/17/21  3:29 AM  Result Value Ref Range   Sodium 141 135 - 145 mmol/L   Potassium 3.8 3.5 - 5.1 mmol/L   Chloride 103 98 - 111 mmol/L   CO2 30 22 - 32 mmol/L   Glucose, Bld 103 (H) 70 - 99 mg/dL    Comment: Glucose reference range applies only to samples taken after fasting for at least 8 hours.   BUN 25 (H) 8 - 23 mg/dL   Creatinine, Ser 1.16 (H) 0.44 - 1.00 mg/dL   Calcium 9.1 8.9 - 10.3 mg/dL   GFR, Estimated 45 (L) >60 mL/min    Comment: (NOTE) Calculated using the CKD-EPI Creatinine Equation (2021)    Anion gap 8 5 - 15    Comment: Performed at [facility], [address] [place], [place] [address]  Magnesium     Status: None   Collection Time: 09/17/21  3:29 AM  Result Value Ref Range   Magnesium 2.4 1.7 - 2.4 mg/dL    Comment: Performed at [facility], [address] [place], [place] [address]  Glucose, capillary     Status: None   Collection Time: 09/26/21  1:16 PM  Result Value Ref Range   Glucose-Capillary 91 70 - 99 mg/dL    Comment: Glucose reference range applies only to samples taken after fasting for at least 8 hours.    PHQ2/9: Depression screen [facility] 2/9 10/06/2021 08/31/2021 08/29/2021 06/01/2021 03/02/2021  Decreased Interest 0 0 0 0 0  Down, Depressed, Hopeless 0 0 0 0 0  PHQ - 2 Score 0 0 0 0 0  Altered sleeping 0 0 0 - -  Tired, decreased energy 0 0 0 - -  Change in appetite 0 0 0 - -  Feeling bad or failure about yourself  0 0 0 - -  Trouble  concentrating 0 0 0 - -  Moving slowly or fidgety/restless 0 0 0 - -  Suicidal thoughts 0 0 0 - -  PHQ-9 Score 0 0 0 - -  Difficult doing work/chores - - - - -  Some recent data might be hidden    phq 9 is negative   Fall Risk: Fall Risk  10/06/2021 08/31/2021 08/29/2021 06/01/2021 03/02/2021  Falls in the past year? 0 0 0 0 0  Number falls in past yr: 0 0 0 0 0  Injury with Fall? 0 0 0 0 0  Comment - - - - -  Risk Factor Category  - - - - -  Risk for fall due to : Impaired mobility;Impaired balance/gait Impaired mobility Impaired mobility - -  Risk for fall due to: Comment - - - - -  Follow up Falls prevention discussed Falls prevention discussed Falls prevention discussed Falls evaluation completed -      Functional Status Survey: Is the patient deaf or have difficulty hearing?: Yes Does the patient have difficulty seeing, even when wearing glasses/contacts?: Yes Does the patient have difficulty concentrating, remembering, or making decisions?: No Does the patient have difficulty walking or climbing stairs?: Yes Does the patient have difficulty dressing or bathing?: No Does the patient have difficulty doing errands alone such as visiting a doctor's office or shopping?: Yes    Assessment & Plan  1. Panlobular emphysema ([place])  No change in therapy   2. Nocturnal hypoxemia due to emphysema [facility])  She is still using nasal canula oxygen 2 liter per day  3. NSTEMI (non-ST elevated  myocardial infarction) ([place])  No chest pain since discharge   4. Lung mass  Reviewed studies with patient and daughter , she saw Dr. Tasia Catchings and Dr. Donella Stade yesterday   5. Chronic diastolic congestive heart failure (HCC)  - furosemide (LASIX) 20 MG tablet; Take 1 tablet (20 mg total) by mouth daily. With furosemide  Dispense: 30 tablet; Refill: 0 - potassium chloride SA (KLOR-CON M) 20 MEQ tablet; Take 1 tablet (20 mEq total) by mouth daily. Take if you gain over 2 lbs in 24 hours  Dispense: 30  tablet; Refill: 0    6. Hospital discharge follow-up

## 2021-10-06 ENCOUNTER — Other Ambulatory Visit: Payer: Self-pay | Admitting: Family Medicine

## 2021-10-06 ENCOUNTER — Telehealth: Payer: Self-pay

## 2021-10-06 ENCOUNTER — Encounter: Payer: Self-pay | Admitting: Family Medicine

## 2021-10-06 ENCOUNTER — Ambulatory Visit (INDEPENDENT_AMBULATORY_CARE_PROVIDER_SITE_OTHER): Payer: Medicare Other | Admitting: Family Medicine

## 2021-10-06 VITALS — BP 118/68 | HR 68 | Temp 98.0°F | Resp 16 | Ht 60.0 in | Wt 102.0 lb

## 2021-10-06 DIAGNOSIS — I5032 Chronic diastolic (congestive) heart failure: Secondary | ICD-10-CM

## 2021-10-06 DIAGNOSIS — J431 Panlobular emphysema: Secondary | ICD-10-CM | POA: Diagnosis not present

## 2021-10-06 DIAGNOSIS — J439 Emphysema, unspecified: Secondary | ICD-10-CM

## 2021-10-06 DIAGNOSIS — I214 Non-ST elevation (NSTEMI) myocardial infarction: Secondary | ICD-10-CM | POA: Diagnosis not present

## 2021-10-06 DIAGNOSIS — R918 Other nonspecific abnormal finding of lung field: Secondary | ICD-10-CM | POA: Diagnosis not present

## 2021-10-06 DIAGNOSIS — Z09 Encounter for follow-up examination after completed treatment for conditions other than malignant neoplasm: Secondary | ICD-10-CM | POA: Diagnosis not present

## 2021-10-06 DIAGNOSIS — G4736 Sleep related hypoventilation in conditions classified elsewhere: Secondary | ICD-10-CM

## 2021-10-06 DIAGNOSIS — I5042 Chronic combined systolic (congestive) and diastolic (congestive) heart failure: Secondary | ICD-10-CM | POA: Insufficient documentation

## 2021-10-06 MED ORDER — FUROSEMIDE 20 MG PO TABS: 20.0000 mg | ORAL_TABLET | Freq: Every day | ORAL | 0 refills | Status: DC

## 2021-10-06 MED ORDER — POTASSIUM CHLORIDE CRYS ER 20 MEQ PO TBCR: 20.0000 meq | EXTENDED_RELEASE_TABLET | Freq: Every day | ORAL | 0 refills | Status: DC

## 2021-10-06 MED ORDER — FUROSEMIDE 20 MG PO TABS: 20.0000 mg | ORAL_TABLET | Freq: Every day | ORAL | 0 refills | Status: AC

## 2021-10-06 NOTE — Progress Notes (Signed)
Met with patient and her daughter during initial consult with Dr. Tasia Catchings and Dr. Baruch Gouty to discuss recent PET scan results and next steps. All questions answered during visit. Informed that will be called with appts once scheduled for follow up CT scan in 3-4 months then see Dr. Tasia Catchings and Dr. Baruch Gouty 1-2 days after on the same day. Instructed to call with any questions or needs. Pt and her daughter verbalized understanding. Nothing further needed at this time.

## 2021-10-06 NOTE — Telephone Encounter (Signed)
Manuela Schwartz, pharmacist, calling in regard to new prescriptions for Lasix and potassium. States she believes instructions for medications have been inadvertently switched. She is deleting the prescriptions and asking for new prescriptions with instructions be sent to pharmacy. Please advise.

## 2021-10-07 DIAGNOSIS — J9811 Atelectasis: Secondary | ICD-10-CM | POA: Diagnosis not present

## 2021-10-07 DIAGNOSIS — I110 Hypertensive heart disease with heart failure: Secondary | ICD-10-CM | POA: Diagnosis not present

## 2021-10-07 DIAGNOSIS — Z7902 Long term (current) use of antithrombotics/antiplatelets: Secondary | ICD-10-CM | POA: Diagnosis not present

## 2021-10-07 DIAGNOSIS — H9190 Unspecified hearing loss, unspecified ear: Secondary | ICD-10-CM | POA: Diagnosis not present

## 2021-10-07 DIAGNOSIS — M810 Age-related osteoporosis without current pathological fracture: Secondary | ICD-10-CM | POA: Diagnosis not present

## 2021-10-07 DIAGNOSIS — J431 Panlobular emphysema: Secondary | ICD-10-CM | POA: Diagnosis not present

## 2021-10-07 DIAGNOSIS — H3530 Unspecified macular degeneration: Secondary | ICD-10-CM | POA: Diagnosis not present

## 2021-10-07 DIAGNOSIS — M5134 Other intervertebral disc degeneration, thoracic region: Secondary | ICD-10-CM | POA: Diagnosis not present

## 2021-10-07 DIAGNOSIS — I5032 Chronic diastolic (congestive) heart failure: Secondary | ICD-10-CM | POA: Diagnosis not present

## 2021-10-07 DIAGNOSIS — M19041 Primary osteoarthritis, right hand: Secondary | ICD-10-CM | POA: Diagnosis not present

## 2021-10-07 DIAGNOSIS — Z7982 Long term (current) use of aspirin: Secondary | ICD-10-CM | POA: Diagnosis not present

## 2021-10-07 DIAGNOSIS — G8929 Other chronic pain: Secondary | ICD-10-CM | POA: Diagnosis not present

## 2021-10-07 DIAGNOSIS — I700 Atherosclerosis of aorta: Secondary | ICD-10-CM | POA: Diagnosis not present

## 2021-10-07 DIAGNOSIS — M19042 Primary osteoarthritis, left hand: Secondary | ICD-10-CM | POA: Diagnosis not present

## 2021-10-07 DIAGNOSIS — Z87891 Personal history of nicotine dependence: Secondary | ICD-10-CM | POA: Diagnosis not present

## 2021-10-07 DIAGNOSIS — I2510 Atherosclerotic heart disease of native coronary artery without angina pectoris: Secondary | ICD-10-CM | POA: Diagnosis not present

## 2021-10-07 DIAGNOSIS — I5023 Acute on chronic systolic (congestive) heart failure: Secondary | ICD-10-CM | POA: Diagnosis not present

## 2021-10-07 DIAGNOSIS — K219 Gastro-esophageal reflux disease without esophagitis: Secondary | ICD-10-CM | POA: Diagnosis not present

## 2021-10-07 DIAGNOSIS — Z9181 History of falling: Secondary | ICD-10-CM | POA: Diagnosis not present

## 2021-10-07 DIAGNOSIS — R918 Other nonspecific abnormal finding of lung field: Secondary | ICD-10-CM | POA: Diagnosis not present

## 2021-10-07 DIAGNOSIS — E785 Hyperlipidemia, unspecified: Secondary | ICD-10-CM | POA: Diagnosis not present

## 2021-10-07 DIAGNOSIS — Z9981 Dependence on supplemental oxygen: Secondary | ICD-10-CM | POA: Diagnosis not present

## 2021-10-07 DIAGNOSIS — D692 Other nonthrombocytopenic purpura: Secondary | ICD-10-CM | POA: Diagnosis not present

## 2021-10-07 DIAGNOSIS — I214 Non-ST elevation (NSTEMI) myocardial infarction: Secondary | ICD-10-CM | POA: Diagnosis not present

## 2021-10-11 DIAGNOSIS — H9190 Unspecified hearing loss, unspecified ear: Secondary | ICD-10-CM | POA: Diagnosis not present

## 2021-10-11 DIAGNOSIS — D692 Other nonthrombocytopenic purpura: Secondary | ICD-10-CM | POA: Diagnosis not present

## 2021-10-11 DIAGNOSIS — I2510 Atherosclerotic heart disease of native coronary artery without angina pectoris: Secondary | ICD-10-CM | POA: Diagnosis not present

## 2021-10-11 DIAGNOSIS — I110 Hypertensive heart disease with heart failure: Secondary | ICD-10-CM | POA: Diagnosis not present

## 2021-10-11 DIAGNOSIS — I5032 Chronic diastolic (congestive) heart failure: Secondary | ICD-10-CM | POA: Diagnosis not present

## 2021-10-11 DIAGNOSIS — M19041 Primary osteoarthritis, right hand: Secondary | ICD-10-CM | POA: Diagnosis not present

## 2021-10-11 DIAGNOSIS — J9811 Atelectasis: Secondary | ICD-10-CM | POA: Diagnosis not present

## 2021-10-11 DIAGNOSIS — Z9981 Dependence on supplemental oxygen: Secondary | ICD-10-CM | POA: Diagnosis not present

## 2021-10-11 DIAGNOSIS — M19042 Primary osteoarthritis, left hand: Secondary | ICD-10-CM | POA: Diagnosis not present

## 2021-10-11 DIAGNOSIS — E785 Hyperlipidemia, unspecified: Secondary | ICD-10-CM | POA: Diagnosis not present

## 2021-10-11 DIAGNOSIS — Z87891 Personal history of nicotine dependence: Secondary | ICD-10-CM | POA: Diagnosis not present

## 2021-10-11 DIAGNOSIS — I700 Atherosclerosis of aorta: Secondary | ICD-10-CM | POA: Diagnosis not present

## 2021-10-11 DIAGNOSIS — M5134 Other intervertebral disc degeneration, thoracic region: Secondary | ICD-10-CM | POA: Diagnosis not present

## 2021-10-11 DIAGNOSIS — Z7982 Long term (current) use of aspirin: Secondary | ICD-10-CM | POA: Diagnosis not present

## 2021-10-11 DIAGNOSIS — K219 Gastro-esophageal reflux disease without esophagitis: Secondary | ICD-10-CM | POA: Diagnosis not present

## 2021-10-11 DIAGNOSIS — R918 Other nonspecific abnormal finding of lung field: Secondary | ICD-10-CM | POA: Diagnosis not present

## 2021-10-11 DIAGNOSIS — G8929 Other chronic pain: Secondary | ICD-10-CM | POA: Diagnosis not present

## 2021-10-11 DIAGNOSIS — J431 Panlobular emphysema: Secondary | ICD-10-CM | POA: Diagnosis not present

## 2021-10-11 DIAGNOSIS — H3530 Unspecified macular degeneration: Secondary | ICD-10-CM | POA: Diagnosis not present

## 2021-10-11 DIAGNOSIS — Z7902 Long term (current) use of antithrombotics/antiplatelets: Secondary | ICD-10-CM | POA: Diagnosis not present

## 2021-10-11 DIAGNOSIS — I5023 Acute on chronic systolic (congestive) heart failure: Secondary | ICD-10-CM | POA: Diagnosis not present

## 2021-10-11 DIAGNOSIS — Z9181 History of falling: Secondary | ICD-10-CM | POA: Diagnosis not present

## 2021-10-11 DIAGNOSIS — M810 Age-related osteoporosis without current pathological fracture: Secondary | ICD-10-CM | POA: Diagnosis not present

## 2021-10-11 DIAGNOSIS — I214 Non-ST elevation (NSTEMI) myocardial infarction: Secondary | ICD-10-CM | POA: Diagnosis not present

## 2021-10-12 ENCOUNTER — Ambulatory Visit: Payer: Medicare Other | Admitting: Family

## 2021-10-13 DIAGNOSIS — Z9181 History of falling: Secondary | ICD-10-CM | POA: Diagnosis not present

## 2021-10-13 DIAGNOSIS — I5023 Acute on chronic systolic (congestive) heart failure: Secondary | ICD-10-CM | POA: Diagnosis not present

## 2021-10-13 DIAGNOSIS — M19042 Primary osteoarthritis, left hand: Secondary | ICD-10-CM | POA: Diagnosis not present

## 2021-10-13 DIAGNOSIS — Z7902 Long term (current) use of antithrombotics/antiplatelets: Secondary | ICD-10-CM | POA: Diagnosis not present

## 2021-10-13 DIAGNOSIS — R918 Other nonspecific abnormal finding of lung field: Secondary | ICD-10-CM | POA: Diagnosis not present

## 2021-10-13 DIAGNOSIS — I110 Hypertensive heart disease with heart failure: Secondary | ICD-10-CM | POA: Diagnosis not present

## 2021-10-13 DIAGNOSIS — Z87891 Personal history of nicotine dependence: Secondary | ICD-10-CM | POA: Diagnosis not present

## 2021-10-13 DIAGNOSIS — I5032 Chronic diastolic (congestive) heart failure: Secondary | ICD-10-CM | POA: Diagnosis not present

## 2021-10-13 DIAGNOSIS — D692 Other nonthrombocytopenic purpura: Secondary | ICD-10-CM | POA: Diagnosis not present

## 2021-10-13 DIAGNOSIS — H9190 Unspecified hearing loss, unspecified ear: Secondary | ICD-10-CM | POA: Diagnosis not present

## 2021-10-13 DIAGNOSIS — M810 Age-related osteoporosis without current pathological fracture: Secondary | ICD-10-CM | POA: Diagnosis not present

## 2021-10-13 DIAGNOSIS — I2510 Atherosclerotic heart disease of native coronary artery without angina pectoris: Secondary | ICD-10-CM | POA: Diagnosis not present

## 2021-10-13 DIAGNOSIS — I214 Non-ST elevation (NSTEMI) myocardial infarction: Secondary | ICD-10-CM | POA: Diagnosis not present

## 2021-10-13 DIAGNOSIS — E785 Hyperlipidemia, unspecified: Secondary | ICD-10-CM | POA: Diagnosis not present

## 2021-10-13 DIAGNOSIS — Z7982 Long term (current) use of aspirin: Secondary | ICD-10-CM | POA: Diagnosis not present

## 2021-10-13 DIAGNOSIS — M5134 Other intervertebral disc degeneration, thoracic region: Secondary | ICD-10-CM | POA: Diagnosis not present

## 2021-10-13 DIAGNOSIS — J9811 Atelectasis: Secondary | ICD-10-CM | POA: Diagnosis not present

## 2021-10-13 DIAGNOSIS — J431 Panlobular emphysema: Secondary | ICD-10-CM | POA: Diagnosis not present

## 2021-10-13 DIAGNOSIS — M19041 Primary osteoarthritis, right hand: Secondary | ICD-10-CM | POA: Diagnosis not present

## 2021-10-13 DIAGNOSIS — K219 Gastro-esophageal reflux disease without esophagitis: Secondary | ICD-10-CM | POA: Diagnosis not present

## 2021-10-13 DIAGNOSIS — Z9981 Dependence on supplemental oxygen: Secondary | ICD-10-CM | POA: Diagnosis not present

## 2021-10-13 DIAGNOSIS — H3530 Unspecified macular degeneration: Secondary | ICD-10-CM | POA: Diagnosis not present

## 2021-10-13 DIAGNOSIS — G8929 Other chronic pain: Secondary | ICD-10-CM | POA: Diagnosis not present

## 2021-10-13 DIAGNOSIS — I700 Atherosclerosis of aorta: Secondary | ICD-10-CM | POA: Diagnosis not present

## 2021-10-15 ENCOUNTER — Other Ambulatory Visit: Payer: Self-pay | Admitting: Family Medicine

## 2021-10-15 DIAGNOSIS — K219 Gastro-esophageal reflux disease without esophagitis: Secondary | ICD-10-CM

## 2021-10-16 NOTE — Telephone Encounter (Signed)
Requested Prescriptions  Pending Prescriptions Disp Refills  . pantoprazole (PROTONIX) 20 MG tablet [Pharmacy Med Name: PANTOPRAZOLE 20MG  TABLETS] 180 tablet 1    Sig: TAKE 1 TABLET BY MOUTH TWICE DAILY(SKIP DOSES IF YOU CAN...RISH OF PNEUMONIA, COLITIS, OSTEOPOROSIS, ANEMIA)     Gastroenterology: Proton Pump Inhibitors Passed - 10/15/2021  7:23 PM      Passed - Valid encounter within last 12 months    Recent Outpatient Visits          1 week ago NSTEMI (non-ST elevated myocardial infarction) [facility])   [facility] Steele Sizer, MD   1 month ago Primary osteoarthritis of both hands   [facility] Steele Sizer, MD   1 month ago Hospital discharge follow-up   [facility] Steele Sizer, MD   4 months ago Chronic pain syndrome   [facility] Steele Sizer, MD   7 months ago Panlobular emphysema [facility])   [facility] Steele Sizer, MD      Future Appointments            In 1 month Ancil Boozer, Drue Stager, MD [facility], [place]   In 4 months  [facility], [facility]

## 2021-10-18 DIAGNOSIS — I739 Peripheral vascular disease, unspecified: Secondary | ICD-10-CM | POA: Diagnosis not present

## 2021-10-18 DIAGNOSIS — Z9861 Coronary angioplasty status: Secondary | ICD-10-CM | POA: Diagnosis not present

## 2021-10-18 DIAGNOSIS — E782 Mixed hyperlipidemia: Secondary | ICD-10-CM | POA: Diagnosis not present

## 2021-10-18 DIAGNOSIS — I10 Essential (primary) hypertension: Secondary | ICD-10-CM | POA: Diagnosis not present

## 2021-10-18 DIAGNOSIS — I2510 Atherosclerotic heart disease of native coronary artery without angina pectoris: Secondary | ICD-10-CM | POA: Diagnosis not present

## 2021-10-19 DIAGNOSIS — I272 Pulmonary hypertension, unspecified: Secondary | ICD-10-CM | POA: Diagnosis not present

## 2021-10-21 ENCOUNTER — Telehealth: Payer: Self-pay

## 2021-10-21 NOTE — Telephone Encounter (Signed)
Tried to call Amy about an appt, no answer and MB was full    Copied from [place] [phone]. Topic: General - Other >> Oct 21, 2021  8:46 AM Fields, Museum/gallery conservator R wrote: Amy (PT) from home health calling to see if she reschedule pt discharge appt to next week because pt states that she's not feeling well, Amy says that she need prior auth. Please advise [phone]

## 2021-10-24 ENCOUNTER — Encounter: Payer: Self-pay | Admitting: Family Medicine

## 2021-10-24 ENCOUNTER — Other Ambulatory Visit
Admission: RE | Admit: 2021-10-24 | Discharge: 2021-10-24 | Disposition: A | Discharge status: Home / Self Care | Payer: Medicare Other | Source: Home / Self Care | Attending: *Deleted | Admitting: *Deleted

## 2021-10-24 ENCOUNTER — Telehealth: Payer: Self-pay | Admitting: Family Medicine

## 2021-10-24 ENCOUNTER — Ambulatory Visit (INDEPENDENT_AMBULATORY_CARE_PROVIDER_SITE_OTHER): Payer: Medicare Other | Admitting: Family Medicine

## 2021-10-24 ENCOUNTER — Ambulatory Visit
Admission: RE | Admit: 2021-10-24 | Discharge: 2021-10-24 | Disposition: A | Discharge status: Home / Self Care | Payer: Medicare Other | Attending: Family Medicine | Admitting: Family Medicine

## 2021-10-24 ENCOUNTER — Ambulatory Visit
Admission: RE | Admit: 2021-10-24 | Discharge: 2021-10-24 | Disposition: A | Discharge status: Home / Self Care | Payer: Medicare Other | Source: Ambulatory Visit | Attending: Family Medicine | Admitting: Family Medicine

## 2021-10-24 VITALS — BP 122/68 | HR 98 | Temp 98.9°F | Resp 16 | Ht 60.0 in | Wt 102.0 lb

## 2021-10-24 DIAGNOSIS — R058 Other specified cough: Secondary | ICD-10-CM

## 2021-10-24 DIAGNOSIS — I110 Hypertensive heart disease with heart failure: Secondary | ICD-10-CM | POA: Diagnosis not present

## 2021-10-24 DIAGNOSIS — I700 Atherosclerosis of aorta: Secondary | ICD-10-CM | POA: Diagnosis not present

## 2021-10-24 DIAGNOSIS — I509 Heart failure, unspecified: Secondary | ICD-10-CM | POA: Diagnosis not present

## 2021-10-24 DIAGNOSIS — D692 Other nonthrombocytopenic purpura: Secondary | ICD-10-CM | POA: Diagnosis not present

## 2021-10-24 DIAGNOSIS — Z7982 Long term (current) use of aspirin: Secondary | ICD-10-CM | POA: Diagnosis not present

## 2021-10-24 DIAGNOSIS — I5032 Chronic diastolic (congestive) heart failure: Secondary | ICD-10-CM | POA: Diagnosis not present

## 2021-10-24 DIAGNOSIS — M19041 Primary osteoarthritis, right hand: Secondary | ICD-10-CM | POA: Diagnosis not present

## 2021-10-24 DIAGNOSIS — K219 Gastro-esophageal reflux disease without esophagitis: Secondary | ICD-10-CM | POA: Diagnosis not present

## 2021-10-24 DIAGNOSIS — J431 Panlobular emphysema: Secondary | ICD-10-CM | POA: Diagnosis not present

## 2021-10-24 DIAGNOSIS — E785 Hyperlipidemia, unspecified: Secondary | ICD-10-CM | POA: Diagnosis not present

## 2021-10-24 DIAGNOSIS — M810 Age-related osteoporosis without current pathological fracture: Secondary | ICD-10-CM | POA: Diagnosis not present

## 2021-10-24 DIAGNOSIS — H3530 Unspecified macular degeneration: Secondary | ICD-10-CM | POA: Diagnosis not present

## 2021-10-24 DIAGNOSIS — I5023 Acute on chronic systolic (congestive) heart failure: Secondary | ICD-10-CM | POA: Diagnosis not present

## 2021-10-24 DIAGNOSIS — J90 Pleural effusion, not elsewhere classified: Secondary | ICD-10-CM | POA: Diagnosis not present

## 2021-10-24 DIAGNOSIS — M5134 Other intervertebral disc degeneration, thoracic region: Secondary | ICD-10-CM | POA: Diagnosis not present

## 2021-10-24 DIAGNOSIS — J449 Chronic obstructive pulmonary disease, unspecified: Secondary | ICD-10-CM | POA: Diagnosis not present

## 2021-10-24 DIAGNOSIS — R918 Other nonspecific abnormal finding of lung field: Secondary | ICD-10-CM | POA: Diagnosis not present

## 2021-10-24 DIAGNOSIS — G4736 Sleep related hypoventilation in conditions classified elsewhere: Secondary | ICD-10-CM | POA: Diagnosis not present

## 2021-10-24 DIAGNOSIS — G8929 Other chronic pain: Secondary | ICD-10-CM | POA: Diagnosis not present

## 2021-10-24 DIAGNOSIS — Z9181 History of falling: Secondary | ICD-10-CM | POA: Diagnosis not present

## 2021-10-24 DIAGNOSIS — I214 Non-ST elevation (NSTEMI) myocardial infarction: Secondary | ICD-10-CM | POA: Diagnosis not present

## 2021-10-24 DIAGNOSIS — M19042 Primary osteoarthritis, left hand: Secondary | ICD-10-CM | POA: Diagnosis not present

## 2021-10-24 DIAGNOSIS — Z7902 Long term (current) use of antithrombotics/antiplatelets: Secondary | ICD-10-CM | POA: Diagnosis not present

## 2021-10-24 DIAGNOSIS — J439 Emphysema, unspecified: Secondary | ICD-10-CM | POA: Diagnosis not present

## 2021-10-24 DIAGNOSIS — Z9981 Dependence on supplemental oxygen: Secondary | ICD-10-CM | POA: Diagnosis not present

## 2021-10-24 DIAGNOSIS — R059 Cough, unspecified: Secondary | ICD-10-CM | POA: Diagnosis not present

## 2021-10-24 DIAGNOSIS — I2510 Atherosclerotic heart disease of native coronary artery without angina pectoris: Secondary | ICD-10-CM | POA: Diagnosis not present

## 2021-10-24 DIAGNOSIS — H9190 Unspecified hearing loss, unspecified ear: Secondary | ICD-10-CM | POA: Diagnosis not present

## 2021-10-24 DIAGNOSIS — Z87891 Personal history of nicotine dependence: Secondary | ICD-10-CM | POA: Diagnosis not present

## 2021-10-24 DIAGNOSIS — I517 Cardiomegaly: Secondary | ICD-10-CM | POA: Diagnosis not present

## 2021-10-24 DIAGNOSIS — J9811 Atelectasis: Secondary | ICD-10-CM | POA: Diagnosis not present

## 2021-10-24 LAB — CBC WITH DIFFERENTIAL/PLATELET
Abs Immature Granulocytes: 0.02 10*3/uL (ref 0.00–0.07)
Basophils Absolute: 0 10*3/uL (ref 0.0–0.1)
Basophils Relative: 0 %
Eosinophils Absolute: 0 10*3/uL (ref 0.0–0.5)
Eosinophils Relative: 0 %
HCT: 36.7 % (ref 36.0–46.0)
Hemoglobin: 12 g/dL (ref 12.0–15.0)
Immature Granulocytes: 0 %
Lymphocytes Relative: 11 %
Lymphs Abs: 0.8 10*3/uL (ref 0.7–4.0)
MCH: 33 pg (ref 26.0–34.0)
MCHC: 32.7 g/dL (ref 30.0–36.0)
MCV: 100.8 fL — ABNORMAL HIGH (ref 80.0–100.0)
Monocytes Absolute: 0.8 10*3/uL (ref 0.1–1.0)
Monocytes Relative: 11 %
Neutro Abs: 5.4 10*3/uL (ref 1.7–7.7)
Neutrophils Relative %: 78 %
Platelets: 197 10*3/uL (ref 150–400)
RBC: 3.64 MIL/uL — ABNORMAL LOW (ref 3.87–5.11)
RDW: 14 % (ref 11.5–15.5)
WBC: 6.9 10*3/uL (ref 4.0–10.5)
nRBC: 0 % (ref 0.0–0.2)

## 2021-10-24 LAB — BASIC METABOLIC PANEL
Anion gap: 9 (ref 5–15)
BUN: 10 mg/dL (ref 8–23)
CO2: 27 mmol/L (ref 22–32)
Calcium: 8.9 mg/dL (ref 8.9–10.3)
Chloride: 99 mmol/L (ref 98–111)
Creatinine, Ser: 0.98 mg/dL (ref 0.44–1.00)
GFR, Estimated: 55 mL/min — ABNORMAL LOW (ref >60)
Glucose, Bld: 155 mg/dL — ABNORMAL HIGH (ref 70–99)
Potassium: 3.3 mmol/L — ABNORMAL LOW (ref 3.5–5.1)
Sodium: 135 mmol/L (ref 135–145)

## 2021-10-24 LAB — BRAIN NATRIURETIC PEPTIDE: B Natriuretic Peptide: 1227.7 pg/mL — ABNORMAL HIGH (ref 0.0–100.0)

## 2021-10-24 MED ORDER — CEFTRIAXONE SODIUM 1 G IJ SOLR
1.0000 g | Freq: Once | INTRAMUSCULAR | Status: AC
Start: 2021-10-24 — End: 2021-10-24
  Administered 2021-10-24: 1 g via INTRAMUSCULAR

## 2021-10-24 NOTE — Telephone Encounter (Signed)
Pt has an appt today.  

## 2021-10-24 NOTE — Progress Notes (Signed)
Name: Samantha Dawson   MRN: [id number]    DOB: 08/09/1929   Date:10/24/2021       Progress Note  Subjective  Chief Complaint  Cough/ Congestion  HPI She came in today with her daughter Samantha Dawson  Cough: patient developed a cough 4 days ago, it was noticed by PT. She has been afebrile and denies any nasal congestion or rhinorrhea. Yesterday she had loose stools, appetite is getting progressively worse, today cough is wet. She is on baseline oxygen - 2 liters. She had NSTEMI last month and also found to have a spiculated mass on her lung , she also has COPD with hypoxemia. Explained this could be CHF flare versus COPD flare, versus COVID-19 versus CAP . She prefers trying therapy as outpatient. Explained we will need to check labs and CXR today and start empirical therapy for CAP but may need to switch to in patient therapy depending on her symptoms and or lab results. She had some leg swelling yesterday but improved with fluid pill    Patient Active Problem List   Diagnosis Date Noted   Chronic diastolic congestive heart failure ([place]) 10/06/2021   Chronic respiratory failure with hypoxia ([place]) 10/05/2021   Lung mass    Elevated troponin 09/10/2021   CHF (congestive heart failure), NYHA class IV, acute, diastolic ([place]) 53/29/9242   Thrombocytopenia ([place]) 08/29/2021   Hypertensive emergency 08/23/2021   NSTEMI (non-ST elevated myocardial infarction) ([place]) 08/18/2021   History of nonmelanoma skin cancer 06/06/2020   Senile purpura ([place]) 05/04/2020   Nocturnal hypoxemia due to emphysema ([place]) 05/04/2020   Carotid atherosclerosis, bilateral 08/27/2018   Bilateral carotid bruits 08/27/2018   CKD (chronic kidney disease) stage 3, GFR 30-59 ml/min (HCC) 08/27/2018   Age-related osteoporosis without current pathological fracture 08/11/2018   Shoulder pain, left 08/29/2017   Acid reflux 68/34/1962   Uncomplicated opioid use 22/97/9892   Hyperglycemia 09/02/2016   Left thyroid nodule 07/13/2016    Abnormal bone scan of cervical spine 06/18/2016   Primary osteoarthritis of both hands 05/03/2016   Elevated serum alkaline phosphatase level 05/03/2016   Degenerative disc disease, thoracic 05/03/2016   Hx of smoking 04/17/2016   Controlled substance agreement signed 03/22/2016   Back pain 03/22/2016   Hyperlipidemia 10/25/2015   Essential hypertension 10/25/2015   Panlobular emphysema ([place]) 10/25/2015   Chronic pain 05/25/2015   Actinic keratosis 05/25/2015    Past Surgical History:  Procedure Laterality Date   ABDOMINAL HYSTERECTOMY     CATARACT EXTRACTION     CORONARY STENT INTERVENTION N/A 09/12/2021   Procedure: CORONARY STENT INTERVENTION;  Surgeon: Wellington Hampshire, MD;  Location: [facility];  Service: Cardiovascular;  Laterality: N/A;   endaryerectomy     LEFT HEART CATH AND CORONARY ANGIOGRAPHY N/A 09/12/2021   Procedure: LEFT HEART CATH AND CORONARY ANGIOGRAPHY;  Surgeon: Wellington Hampshire, MD;  Location: [facility];  Service: Cardiovascular;  Laterality: N/A;   renal stenting      Family History  Problem Relation Age of Onset   Cancer Mother    Coronary artery disease Father    Cerebrovascular Accident Father    Cancer Sister        breast cancer   Cancer Brother        stomach   Heart disease Daughter        heart vavle bypass?   Stroke Sister    Lung disease Brother    Cancer Brother  unknown   Arthritis Daughter     Social History   Tobacco Use   Smoking status: Former    Packs/day: 1.00    Years: 50.00    Pack years: 50.00    Types: Cigarettes    Quit date: 2017    Years since quitting: 5.9    Passive exposure: Never   Smokeless tobacco: Never   Tobacco comments:    smoking cesssation materials not required  Substance Use Topics   Alcohol use: No    Alcohol/week: 0.0 standard drinks     Current Outpatient Medications:    albuterol (PROAIR HFA) 108 (90 Base) MCG/ACT inhaler, INHALE 2 PUFFS INTO THE LUNGS EVERY  4 HOURS AS NEEDED FOR WHEEZING OR SHORTNESS OF BREATH; FUTURE REFILLS FROM LUNG DOCTOR, Disp: 8.5 g, Rfl: 3   aspirin 81 MG tablet, Take 81 mg by mouth daily., Disp: , Rfl:    furosemide (LASIX) 20 MG tablet, Take 1 tablet (20 mg total) by mouth daily. Take when you gain 2 lbs in 24 hours, Disp: 30 tablet, Rfl: 0   [START ON 11/06/2021] HYDROcodone-acetaminophen (NORCO/VICODIN) 5-325 MG tablet, Take 1 tablet by mouth 3 (three) times daily as needed for moderate pain. Fill September 07/31/2021, Disp: 90 tablet, Rfl: 0   HYDROcodone-acetaminophen (NORCO/VICODIN) 5-325 MG tablet, Take 1 tablet by mouth 3 (three) times daily as needed for moderate pain. To last one month, Disp: 90 tablet, Rfl: 0   HYDROcodone-acetaminophen (NORCO/VICODIN) 5-325 MG tablet, Take 1 tablet by mouth 3 (three) times daily as needed for moderate pain. To last one month, Disp: 90 tablet, Rfl: 0   metoprolol tartrate (LOPRESSOR) 50 MG tablet, Take 50 mg by mouth 2 (two) times daily., Disp: , Rfl:    pantoprazole (PROTONIX) 20 MG tablet, TAKE 1 TABLET BY MOUTH TWICE DAILY(SKIP DOSES IF YOU CAN...RISH OF PNEUMONIA, COLITIS, OSTEOPOROSIS, ANEMIA), Disp: 180 tablet, Rfl: 0   potassium chloride SA (KLOR-CON M) 20 MEQ tablet, Take 1 tablet (20 mEq total) by mouth daily. When you take furosemide, Disp: 30 tablet, Rfl: 0   rosuvastatin (CRESTOR) 20 MG tablet, Take 20 mg by mouth daily., Disp: , Rfl:    ticagrelor (BRILINTA) 90 MG TABS tablet, Take 1 tablet (90 mg total) by mouth 2 (two) times daily., Disp: 180 tablet, Rfl: 3   umeclidinium-vilanterol (ANORO ELLIPTA) 62.5-25 MCG/ACT AEPB, Inhale 1 puff into the lungs daily at 6 (six) AM., Disp: 1 each, Rfl: 2  Allergies  Allergen Reactions   Alendronate Nausea Only    I personally reviewed active problem list, medication list, allergies, family history, social history, health maintenance with the patient/caregiver today.   ROS  Ten systems reviewed and is negative except as mentioned  in HPI    Objective  Vitals:   10/24/21 1422  BP: 122/68  Pulse: 98  Resp: 16  Temp: 98.9 F (37.2 C)  SpO2: 99%  Weight: 102 lb (46.3 kg)  Height: 5' (1.524 m)    Body mass index is 19.92 kg/m.  Physical Exam  Constitutional: Patient appears well-developed and  frail  . Mild respiratory distress  HEENT: head atraumatic, normocephalic, pupils equal and reactive to light,neck supple Cardiovascular: Normal rate, regular rhythm and normal heart sounds.  No murmur heard. No BLE edema. Pulmonary/Chest: tachypnea noticed, rhonchi worse on right lung field  Abdominal: Soft.  There is no tenderness. Psychiatric: Patient has a normal mood and affect. behavior is normal. Judgment and thought content normal.    PHQ2/9: Depression screen PHQ  2/9 10/24/2021 10/06/2021 08/31/2021 08/29/2021 06/01/2021  Decreased Interest 0 0 0 0 0  Down, Depressed, Hopeless 0 0 0 0 0  PHQ - 2 Score 0 0 0 0 0  Altered sleeping 0 0 0 0 -  Tired, decreased energy 0 0 0 0 -  Change in appetite 0 0 0 0 -  Feeling bad or failure about yourself  0 0 0 0 -  Trouble concentrating 0 0 0 0 -  Moving slowly or fidgety/restless 0 0 0 0 -  Suicidal thoughts 0 0 0 0 -  PHQ-9 Score 0 0 0 0 -  Difficult doing work/chores - - - - -  Some recent data might be hidden    phq 9 is negative   Fall Risk: Fall Risk  10/24/2021 10/06/2021 08/31/2021 08/29/2021 06/01/2021  Falls in the past year? 0 0 0 0 0  Number falls in past yr: 0 0 0 0 0  Injury with Fall? 0 0 0 0 0  Comment - - - - -  Risk Factor Category  - - - - -  Risk for fall due to : Impaired mobility;Impaired balance/gait Impaired mobility;Impaired balance/gait Impaired mobility Impaired mobility -  Risk for fall due to: Comment - - - - -  Follow up Falls prevention discussed Falls prevention discussed Falls prevention discussed Falls prevention discussed Falls evaluation completed      Functional Status Survey: Is the patient deaf or have difficulty  hearing?: Yes Does the patient have difficulty seeing, even when wearing glasses/contacts?: Yes Does the patient have difficulty concentrating, remembering, or making decisions?: No Does the patient have difficulty walking or climbing stairs?: Yes Does the patient have difficulty dressing or bathing?: No Does the patient have difficulty doing errands alone such as visiting a doctor's office or shopping?: Yes    Assessment & Plan  1. Productive cough  May try mucinex otc for secretion  - CBC with Differential/Platelet; Future - Brain natriuretic peptide; Future - Basic metabolic panel; Future - DG Chest 2 View; Future - cefTRIAXone (ROCEPHIN) injection 1 g  2. Nocturnal hypoxemia due to emphysema ([place])   3. Chronic diastolic congestive heart failure ([place])

## 2021-10-24 NOTE — Telephone Encounter (Signed)
Amy with [facility] called saying since last Thursday pt has had cold like symptoms and patient is running a fever and cough.  Amy is her PT and is just wanting to let someone know what is going on.   Checked for an appt and there is nothing until the 21st with any provider.  CB# [phone] for AMY

## 2021-10-25 DIAGNOSIS — R058 Other specified cough: Secondary | ICD-10-CM | POA: Diagnosis not present

## 2021-10-26 LAB — SARS-COV-2, NAA 2 DAY TAT

## 2021-10-26 LAB — NOVEL CORONAVIRUS, NAA: SARS-CoV-2, NAA: DETECTED — AB

## 2021-10-26 NOTE — Progress Notes (Signed)
Name: Samantha Dawson   MRN: [id number]    DOB: June 09, 1929   Date:10/27/2021       Progress Note  Subjective  Chief Complaint  Follow Up  HPI  COVID-19: patient came in on 12/19 with complains of cough and fatigue, COVID-19 test turned positive but it was out of the window of therapy. She states she is still coughing, no chest pain, she is on oxygen for baseline CHF and chronic respiratory failure with hypoxia. Pulse ox has been within normal limits. Appetite is improving. We advised her to go up on lasix once BNP was found to be elevated, but may resume baseline dose. CXR reviewed and also labs done on 12/19. Discussed importance of caregivers to monitor for symptoms of COVID,may be out if asymptomatic, may also do home COVID-19 test. Isolate if symptoms starts . Discussed taking her to [facility] if worsening of symptoms and should take mucinex dm for cough and take deep breaths to prevent development of pneumonia    Patient Active Problem List   Diagnosis Date Noted   Chronic diastolic congestive heart failure ([place]) 10/06/2021   Chronic respiratory failure with hypoxia ([place]) 10/05/2021   Lung mass    Elevated troponin 09/10/2021   CHF (congestive heart failure), NYHA class IV, acute, diastolic ([place]) 66/44/0347   Thrombocytopenia ([place]) 08/29/2021   Hypertensive emergency 08/23/2021   NSTEMI (non-ST elevated myocardial infarction) ([place]) 08/18/2021   History of nonmelanoma skin cancer 06/06/2020   Senile purpura ([place]) 05/04/2020   Nocturnal hypoxemia due to emphysema ([place]) 05/04/2020   Carotid atherosclerosis, bilateral 08/27/2018   Bilateral carotid bruits 08/27/2018   CKD (chronic kidney disease) stage 3, GFR 30-59 ml/min (HCC) 08/27/2018   Age-related osteoporosis without current pathological fracture 08/11/2018   Shoulder pain, left 08/29/2017   Acid reflux 42/59/5638   Uncomplicated opioid use 75/64/3329   Hyperglycemia 09/02/2016   Left thyroid nodule 07/13/2016   Abnormal bone scan  of cervical spine 06/18/2016   Primary osteoarthritis of both hands 05/03/2016   Elevated serum alkaline phosphatase level 05/03/2016   Degenerative disc disease, thoracic 05/03/2016   Hx of smoking 04/17/2016   Controlled substance agreement signed 03/22/2016   Back pain 03/22/2016   Hyperlipidemia 10/25/2015   Essential hypertension 10/25/2015   Panlobular emphysema ([place]) 10/25/2015   Chronic pain 05/25/2015   Actinic keratosis 05/25/2015    Past Surgical History:  Procedure Laterality Date   ABDOMINAL HYSTERECTOMY     CATARACT EXTRACTION     CORONARY STENT INTERVENTION N/A 09/12/2021   Procedure: CORONARY STENT INTERVENTION;  Surgeon: Wellington Hampshire, MD;  Location: [place] [facility];  Service: Cardiovascular;  Laterality: N/A;   endaryerectomy     LEFT HEART CATH AND CORONARY ANGIOGRAPHY N/A 09/12/2021   Procedure: LEFT HEART CATH AND CORONARY ANGIOGRAPHY;  Surgeon: Wellington Hampshire, MD;  Location: [place] [facility];  Service: Cardiovascular;  Laterality: N/A;   renal stenting      Family History  Problem Relation Age of Onset   Cancer Mother    Coronary artery disease Father    Cerebrovascular Accident Father    Cancer Sister        breast cancer   Cancer Brother        stomach   Heart disease Daughter        heart vavle bypass?   Stroke Sister    Lung disease Brother    Cancer Brother        unknown   Arthritis Daughter  Social History   Tobacco Use   Smoking status: Former    Packs/day: 1.00    Years: 50.00    Pack years: 50.00    Types: Cigarettes    Quit date: 2017    Years since quitting: 5.9    Passive exposure: Never   Smokeless tobacco: Never   Tobacco comments:    smoking cesssation materials not required  Substance Use Topics   Alcohol use: No    Alcohol/week: 0.0 standard drinks     Current Outpatient Medications:    albuterol (PROAIR HFA) 108 (90 Base) MCG/ACT inhaler, INHALE 2 PUFFS INTO THE LUNGS EVERY 4 HOURS AS NEEDED FOR  WHEEZING OR SHORTNESS OF BREATH; FUTURE REFILLS FROM LUNG DOCTOR, Disp: 8.5 g, Rfl: 3   aspirin 81 MG tablet, Take 81 mg by mouth daily., Disp: , Rfl:    furosemide (LASIX) 20 MG tablet, Take 1 tablet (20 mg total) by mouth daily. Take when you gain 2 lbs in 24 hours, Disp: 30 tablet, Rfl: 0   [START ON 11/06/2021] HYDROcodone-acetaminophen (NORCO/VICODIN) 5-325 MG tablet, Take 1 tablet by mouth 3 (three) times daily as needed for moderate pain. Fill September 07/31/2021, Disp: 90 tablet, Rfl: 0   HYDROcodone-acetaminophen (NORCO/VICODIN) 5-325 MG tablet, Take 1 tablet by mouth 3 (three) times daily as needed for moderate pain. To last one month, Disp: 90 tablet, Rfl: 0   HYDROcodone-acetaminophen (NORCO/VICODIN) 5-325 MG tablet, Take 1 tablet by mouth 3 (three) times daily as needed for moderate pain. To last one month, Disp: 90 tablet, Rfl: 0   metoprolol succinate (TOPROL-XL) 50 MG 24 hr tablet, Take 50 mg by mouth daily., Disp: , Rfl:    metoprolol tartrate (LOPRESSOR) 50 MG tablet, Take 50 mg by mouth 2 (two) times daily., Disp: , Rfl:    pantoprazole (PROTONIX) 20 MG tablet, TAKE 1 TABLET BY MOUTH TWICE DAILY(SKIP DOSES IF YOU CAN...RISH OF PNEUMONIA, COLITIS, OSTEOPOROSIS, ANEMIA), Disp: 180 tablet, Rfl: 0   potassium chloride SA (KLOR-CON M) 20 MEQ tablet, Take 1 tablet (20 mEq total) by mouth daily. When you take furosemide, Disp: 30 tablet, Rfl: 0   rosuvastatin (CRESTOR) 20 MG tablet, Take 20 mg by mouth daily., Disp: , Rfl:    ticagrelor (BRILINTA) 90 MG TABS tablet, Take 1 tablet (90 mg total) by mouth 2 (two) times daily., Disp: 180 tablet, Rfl: 3   umeclidinium-vilanterol (ANORO ELLIPTA) 62.5-25 MCG/ACT AEPB, Inhale 1 puff into the lungs daily at 6 (six) AM., Disp: 1 each, Rfl: 2  Allergies  Allergen Reactions   Alendronate Nausea Only    I personally reviewed active problem list, medication list, allergies, family history, social history, health maintenance with the patient/caregiver  today.   ROS  Ten systems reviewed and is negative except as mentioned in HPI   Objective  Vitals:   10/27/21 1127  BP: 110/60  Pulse: 80  Resp: 16  Temp: 98 F (36.7 C)  TempSrc: Oral  SpO2: 97%  Weight: 102 lb (46.3 kg)  Height: 5' (1.524 m)    Body mass index is 19.92 kg/m.  Physical Exam  Constitutional: Patient appears frail, sitting on wheelchair, on nasal cannula oxygen. No distress.  HEENT: head atraumatic, normocephalic,  neck supple, Cardiovascular: Normal rate, regular rhythm and normal heart sounds.  No murmur heard. No BLE edema. Pulmonary/Chest: Effort normal , rhonchi present and worse on left lung field . Abdominal: Soft.  There is no tenderness. Psychiatric: Patient has a normal mood and affect. behavior is  normal. Judgment and thought content normal.    PHQ2/9: Depression screen [facility] 2/9 10/27/2021 10/24/2021 10/06/2021 08/31/2021 08/29/2021  Decreased Interest 0 0 0 0 0  Down, Depressed, Hopeless 0 0 0 0 0  PHQ - 2 Score 0 0 0 0 0  Altered sleeping 0 0 0 0 0  Tired, decreased energy 0 0 0 0 0  Change in appetite 0 0 0 0 0  Feeling bad or failure about yourself  0 0 0 0 0  Trouble concentrating 0 0 0 0 0  Moving slowly or fidgety/restless 0 0 0 0 0  Suicidal thoughts 0 0 0 0 0  PHQ-9 Score 0 0 0 0 0  Difficult doing work/chores Not difficult at all - - - -  Some recent data might be hidden    phq 9 is negative   Fall Risk: Fall Risk  10/27/2021 10/24/2021 10/06/2021 08/31/2021 08/29/2021  Falls in the past year? 0 0 0 0 0  Number falls in past yr: 0 0 0 0 0  Injury with Fall? 0 0 0 0 0  Comment - - - - -  Risk Factor Category  - - - - -  Risk for fall due to : Impaired balance/gait Impaired mobility;Impaired balance/gait Impaired mobility;Impaired balance/gait Impaired mobility Impaired mobility  Risk for fall due to: Comment - - - - -  Follow up Falls prevention discussed Falls prevention discussed Falls prevention discussed Falls  prevention discussed Falls prevention discussed      Functional Status Survey: Is the patient deaf or have difficulty hearing?: No Does the patient have difficulty seeing, even when wearing glasses/contacts?: No Does the patient have difficulty concentrating, remembering, or making decisions?: No Does the patient have difficulty walking or climbing stairs?: No Does the patient have difficulty dressing or bathing?: No Does the patient have difficulty doing errands alone such as visiting a doctor's office or shopping?: No    Assessment & Plan  1. COVID-19   Advised fluids, deep inspiration at least hourly and when to go to [facility]

## 2021-10-27 ENCOUNTER — Encounter: Payer: Self-pay | Admitting: Family Medicine

## 2021-10-27 ENCOUNTER — Ambulatory Visit (INDEPENDENT_AMBULATORY_CARE_PROVIDER_SITE_OTHER): Payer: Medicare Other | Admitting: Family Medicine

## 2021-10-27 VITALS — BP 110/60 | HR 80 | Temp 98.0°F | Resp 16 | Ht 60.0 in | Wt 102.0 lb

## 2021-10-27 DIAGNOSIS — U071 COVID-19: Secondary | ICD-10-CM

## 2021-11-04 DIAGNOSIS — I110 Hypertensive heart disease with heart failure: Secondary | ICD-10-CM | POA: Diagnosis not present

## 2021-11-04 DIAGNOSIS — J431 Panlobular emphysema: Secondary | ICD-10-CM | POA: Diagnosis not present

## 2021-11-04 DIAGNOSIS — Z7982 Long term (current) use of aspirin: Secondary | ICD-10-CM | POA: Diagnosis not present

## 2021-11-04 DIAGNOSIS — I2510 Atherosclerotic heart disease of native coronary artery without angina pectoris: Secondary | ICD-10-CM | POA: Diagnosis not present

## 2021-11-04 DIAGNOSIS — H9190 Unspecified hearing loss, unspecified ear: Secondary | ICD-10-CM | POA: Diagnosis not present

## 2021-11-04 DIAGNOSIS — K219 Gastro-esophageal reflux disease without esophagitis: Secondary | ICD-10-CM | POA: Diagnosis not present

## 2021-11-04 DIAGNOSIS — I5032 Chronic diastolic (congestive) heart failure: Secondary | ICD-10-CM | POA: Diagnosis not present

## 2021-11-04 DIAGNOSIS — M19041 Primary osteoarthritis, right hand: Secondary | ICD-10-CM | POA: Diagnosis not present

## 2021-11-04 DIAGNOSIS — E785 Hyperlipidemia, unspecified: Secondary | ICD-10-CM | POA: Diagnosis not present

## 2021-11-04 DIAGNOSIS — I5023 Acute on chronic systolic (congestive) heart failure: Secondary | ICD-10-CM | POA: Diagnosis not present

## 2021-11-04 DIAGNOSIS — Z9181 History of falling: Secondary | ICD-10-CM | POA: Diagnosis not present

## 2021-11-04 DIAGNOSIS — Z7902 Long term (current) use of antithrombotics/antiplatelets: Secondary | ICD-10-CM | POA: Diagnosis not present

## 2021-11-04 DIAGNOSIS — M5134 Other intervertebral disc degeneration, thoracic region: Secondary | ICD-10-CM | POA: Diagnosis not present

## 2021-11-04 DIAGNOSIS — M19042 Primary osteoarthritis, left hand: Secondary | ICD-10-CM | POA: Diagnosis not present

## 2021-11-04 DIAGNOSIS — D692 Other nonthrombocytopenic purpura: Secondary | ICD-10-CM | POA: Diagnosis not present

## 2021-11-04 DIAGNOSIS — I214 Non-ST elevation (NSTEMI) myocardial infarction: Secondary | ICD-10-CM | POA: Diagnosis not present

## 2021-11-04 DIAGNOSIS — H3530 Unspecified macular degeneration: Secondary | ICD-10-CM | POA: Diagnosis not present

## 2021-11-04 DIAGNOSIS — G8929 Other chronic pain: Secondary | ICD-10-CM | POA: Diagnosis not present

## 2021-11-04 DIAGNOSIS — Z9981 Dependence on supplemental oxygen: Secondary | ICD-10-CM | POA: Diagnosis not present

## 2021-11-04 DIAGNOSIS — M810 Age-related osteoporosis without current pathological fracture: Secondary | ICD-10-CM | POA: Diagnosis not present

## 2021-11-04 DIAGNOSIS — I700 Atherosclerosis of aorta: Secondary | ICD-10-CM | POA: Diagnosis not present

## 2021-11-04 DIAGNOSIS — Z87891 Personal history of nicotine dependence: Secondary | ICD-10-CM | POA: Diagnosis not present

## 2021-11-04 DIAGNOSIS — J9811 Atelectasis: Secondary | ICD-10-CM | POA: Diagnosis not present

## 2021-11-04 DIAGNOSIS — R918 Other nonspecific abnormal finding of lung field: Secondary | ICD-10-CM | POA: Diagnosis not present

## 2021-11-28 NOTE — Progress Notes (Signed)
Name: Samantha Dawson   MRN: [id number]    DOB: February 02, 1929   Date:11/29/2021       Progress Note  Subjective  Chief Complaint  Follow Up  HPI  Chronic pain from arthritis also DDD thoracic spine:  Long history of pain  previously prescribed by Dr. Sanda Klein at 10-325 - she was taking 105 tablets per month and we gradually went down to 60 pills for month but the pain intensified and she was hurting all over. She is now taking 90 per month and pain is stable. . Last pain contract agreement was signed  11/2020 , drug screen reviewed done 2022 and was positive for opioids and negative for other drugs. Reviewed drug data base today. She was given Gabapentin to take at night but she never titrated dose up and stopped taking because it did not help much and caused sedation during the day.  Explained that since she had a recent hospitalization we will keep her at 90 pills per month. Her current pain level is 4-5/10. She denies constipation or side effects of medication  History of NSTEMI: she developed chest pain and SOB 5 days prior to going to [facility] on 08/18/21. She was found to have very high troponin and diagnosied with NSTEMI and acute CHF. She had an echo that showed diastolic dyfunction with EF 35-40 %, she was sent home with medication management, unable to take ACE or ARB due to hypotension.She no longer has chest pain but still feels tired , not back to baseline but feeling better than she was after hospital discharge She has chronic congestive heart failure - she has orthopnea cannot lay flat at night.   Emphysema with nocturnal hypoxia: currently using oxygen 2 liters continuously since  MI, she is using Anoro daily and Albuterol prn She states no cough, she has SOB but controlled with oxygen . She was recently seen by Dr. Humphrey Rolls and per patient she is now taking Furosemide half pill daily to help with fluids in her lungs   Lung nodule: going to oncologist . CT 11/08 showed irregular nodule possible  malignancy, she is going back for repeat CT to re-evaluated   Senile purpura: both arms, on anti-platelets medication and reassurance given  CKI stage III: she is currently taking furosemide given by cardiologist, half pill daily, advised to try taking it every other day instead and monitor for symptoms  Malnutrition: weight prior to hospital stay ( 10/22) was 102 lbs today with all her clothes and coat one is down to 92 lbs . Discussed importance of increasing calorie intake.   Patient Active Problem List   Diagnosis Date Noted   Chronic diastolic congestive heart failure ([place]) 10/06/2021   Chronic respiratory failure with hypoxia ([place]) 10/05/2021   Lung mass    Elevated troponin 09/10/2021   CHF (congestive heart failure), NYHA class IV, acute, diastolic ([place]) 81/11/7508   Thrombocytopenia ([place]) 08/29/2021   Hypertensive emergency 08/23/2021   NSTEMI (non-ST elevated myocardial infarction) ([place]) 08/18/2021   History of nonmelanoma skin cancer 06/06/2020   Senile purpura ([place]) 05/04/2020   Nocturnal hypoxemia due to emphysema ([place]) 05/04/2020   Carotid atherosclerosis, bilateral 08/27/2018   Bilateral carotid bruits 08/27/2018   CKD (chronic kidney disease) stage 3, GFR 30-59 ml/min (HCC) 08/27/2018   Age-related osteoporosis without current pathological fracture 08/11/2018   Shoulder pain, left 08/29/2017   Acid reflux 25/85/2778   Uncomplicated opioid use 24/23/5361   Hyperglycemia 09/02/2016   Left thyroid nodule 07/13/2016  Abnormal bone scan of cervical spine 06/18/2016   Primary osteoarthritis of both hands 05/03/2016   Elevated serum alkaline phosphatase level 05/03/2016   Degenerative disc disease, thoracic 05/03/2016   Hx of smoking 04/17/2016   Controlled substance agreement signed 03/22/2016   Back pain 03/22/2016   Hyperlipidemia 10/25/2015   Essential hypertension 10/25/2015   Panlobular emphysema ([place]) 10/25/2015   Chronic pain 05/25/2015   Actinic keratosis  05/25/2015    Past Surgical History:  Procedure Laterality Date   ABDOMINAL HYSTERECTOMY     CATARACT EXTRACTION     CORONARY STENT INTERVENTION N/A 09/12/2021   Procedure: CORONARY STENT INTERVENTION;  Surgeon: Wellington Hampshire, MD;  Location: [place] [facility];  Service: Cardiovascular;  Laterality: N/A;   endaryerectomy     LEFT HEART CATH AND CORONARY ANGIOGRAPHY N/A 09/12/2021   Procedure: LEFT HEART CATH AND CORONARY ANGIOGRAPHY;  Surgeon: Wellington Hampshire, MD;  Location: [place] [facility];  Service: Cardiovascular;  Laterality: N/A;   renal stenting      Family History  Problem Relation Age of Onset   Cancer Mother    Coronary artery disease Father    Cerebrovascular Accident Father    Cancer Sister        breast cancer   Cancer Brother        stomach   Heart disease Daughter        heart vavle bypass?   Stroke Sister    Lung disease Brother    Cancer Brother        unknown   Arthritis Daughter     Social History   Tobacco Use   Smoking status: Former    Packs/day: 1.00    Years: 50.00    Pack years: 50.00    Types: Cigarettes    Quit date: 2017    Years since quitting: 6.0    Passive exposure: Never   Smokeless tobacco: Never   Tobacco comments:    smoking cesssation materials not required  Substance Use Topics   Alcohol use: No    Alcohol/week: 0.0 standard drinks     Current Outpatient Medications:    albuterol (PROAIR HFA) 108 (90 Base) MCG/ACT inhaler, INHALE 2 PUFFS INTO THE LUNGS EVERY 4 HOURS AS NEEDED FOR WHEEZING OR SHORTNESS OF BREATH; FUTURE REFILLS FROM LUNG DOCTOR, Disp: 8.5 g, Rfl: 3   aspirin 81 MG tablet, Take 81 mg by mouth daily., Disp: , Rfl:    furosemide (LASIX) 20 MG tablet, Take 1 tablet (20 mg total) by mouth daily. Take when you gain 2 lbs in 24 hours, Disp: 30 tablet, Rfl: 0   HYDROcodone-acetaminophen (NORCO/VICODIN) 5-325 MG tablet, Take 1 tablet by mouth 3 (three) times daily as needed for moderate pain. Fill  September 07/31/2021, Disp: 90 tablet, Rfl: 0   HYDROcodone-acetaminophen (NORCO/VICODIN) 5-325 MG tablet, Take 1 tablet by mouth 3 (three) times daily as needed for moderate pain. To last one month, Disp: 90 tablet, Rfl: 0   HYDROcodone-acetaminophen (NORCO/VICODIN) 5-325 MG tablet, Take 1 tablet by mouth 3 (three) times daily as needed for moderate pain. To last one month, Disp: 90 tablet, Rfl: 0   metoprolol succinate (TOPROL-XL) 50 MG 24 hr tablet, Take 50 mg by mouth daily., Disp: , Rfl:    metoprolol tartrate (LOPRESSOR) 50 MG tablet, Take 50 mg by mouth 2 (two) times daily., Disp: , Rfl:    pantoprazole (PROTONIX) 20 MG tablet, TAKE 1 TABLET BY MOUTH TWICE DAILY(SKIP DOSES IF YOU CAN...RISH OF  PNEUMONIA, COLITIS, OSTEOPOROSIS, ANEMIA), Disp: 180 tablet, Rfl: 0   potassium chloride SA (KLOR-CON M) 20 MEQ tablet, Take 1 tablet (20 mEq total) by mouth daily. When you take furosemide, Disp: 30 tablet, Rfl: 0   rosuvastatin (CRESTOR) 20 MG tablet, Take 20 mg by mouth daily., Disp: , Rfl:    ticagrelor (BRILINTA) 90 MG TABS tablet, Take 1 tablet (90 mg total) by mouth 2 (two) times daily., Disp: 180 tablet, Rfl: 3   umeclidinium-vilanterol (ANORO ELLIPTA) 62.5-25 MCG/ACT AEPB, Inhale 1 puff into the lungs daily at 6 (six) AM., Disp: 1 each, Rfl: 2  Allergies  Allergen Reactions   Alendronate Nausea Only    I personally reviewed active problem list, medication list, allergies, family history, social history, health maintenance with the patient/caregiver today.   ROS  Constitutional: Negative for fever or weight change.  Respiratory: Negative for cough and shortness of breath.   Cardiovascular: Negative for chest pain or palpitations.  Gastrointestinal: Negative for abdominal pain, no bowel changes.  Musculoskeletal: Negative for gait problem or joint swelling.  Skin: Negative for rash.  Neurological: Negative for dizziness or headache.  No other specific complaints in a complete review of  systems (except as listed in HPI above).   Objective  Vitals:   11/29/21 1058  BP: 102/64  Pulse: 71  Resp: 16  SpO2: 99%  Weight: 92 lb (41.7 kg)  Height: 5' (1.524 m)    Body mass index is 17.97 kg/m.  Physical Exam  Constitutional: Patient appears well-developed and underweight/malnourished.No distress.  HEENT: head atraumatic, normocephalic, pupils equal and reactive to light, neck supple. Hearing loss Cardiovascular: Normal rate, regular rhythm and normal heart sounds.  No murmur heard. No BLE edema. Pulmonary/Chest: Effort normal and breath sounds normal. No respiratory distress. Abdominal: Soft.  There is no tenderness. Psychiatric: Patient has a normal mood and affect. behavior is normal. Judgment and thought content normal.  Muscular Skeletal: sitting on wheelchair, using nasal cannula oxygen     PHQ2/9: Depression screen [facility] 2/9 11/29/2021 10/27/2021 10/24/2021 10/06/2021 08/31/2021  Decreased Interest 0 0 0 0 0  Down, Depressed, Hopeless 0 0 0 0 0  PHQ - 2 Score 0 0 0 0 0  Altered sleeping 0 0 0 0 0  Tired, decreased energy 0 0 0 0 0  Change in appetite 0 0 0 0 0  Feeling bad or failure about yourself  0 0 0 0 0  Trouble concentrating 0 0 0 0 0  Moving slowly or fidgety/restless 0 0 0 0 0  Suicidal thoughts 0 0 0 0 0  PHQ-9 Score 0 0 0 0 0  Difficult doing work/chores - Not difficult at all - - -  Some recent data might be hidden    phq 9 is negative   Fall Risk: Fall Risk  11/29/2021 10/27/2021 10/24/2021 10/06/2021 08/31/2021  Falls in the past year? 0 0 0 0 0  Number falls in past yr: 0 0 0 0 0  Injury with Fall? 0 0 0 0 0  Comment - - - - -  Risk Factor Category  - - - - -  Risk for fall due to : Impaired balance/gait;Impaired mobility Impaired balance/gait Impaired mobility;Impaired balance/gait Impaired mobility;Impaired balance/gait Impaired mobility  Risk for fall due to: Comment - - - - -  Follow up Falls prevention discussed Falls prevention  discussed Falls prevention discussed Falls prevention discussed Falls prevention discussed      Functional Status Survey: Is the patient  deaf or have difficulty hearing?: Yes Does the patient have difficulty seeing, even when wearing glasses/contacts?: Yes Does the patient have difficulty concentrating, remembering, or making decisions?: No Does the patient have difficulty walking or climbing stairs?: Yes Does the patient have difficulty dressing or bathing?: No Does the patient have difficulty doing errands alone such as visiting a doctor's office or shopping?: Yes    Assessment & Plan  1. Nocturnal hypoxemia due to emphysema (HCC)  - umeclidinium-vilanterol (ANORO ELLIPTA) 62.5-25 MCG/ACT AEPB; Inhale 1 puff into the lungs daily at 6 (six) AM.  Dispense: 1 each; Refill: 2  2. Chronic diastolic congestive heart failure (HCC)  Taking lasix now  3. Panlobular emphysema ([place])   4. Senile purpura ([place])  Reassurance given  5. Gastroesophageal reflux disease without esophagitis  - pantoprazole (PROTONIX) 20 MG tablet; Take 1 tablet (20 mg total) by mouth daily.  Dispense: 180 tablet; Refill: 0  6. Stage 3a chronic kidney disease ([place])   7. Primary osteoarthritis of both hands  - HYDROcodone-acetaminophen (NORCO/VICODIN) 5-325 MG tablet; Take 1 tablet by mouth 3 (three) times daily as needed for moderate pain. Fill 02/04/2022  Dispense: 90 tablet; Refill: 0 - HYDROcodone-acetaminophen (NORCO/VICODIN) 5-325 MG tablet; Take 1 tablet by mouth 3 (three) times daily as needed for moderate pain. To last one month  Dispense: 90 tablet; Refill: 0 - HYDROcodone-acetaminophen (NORCO/VICODIN) 5-325 MG tablet; Take 1 tablet by mouth 3 (three) times daily as needed for moderate pain. To last one month  Dispense: 90 tablet; Refill: 0  8. Mild protein-calorie malnutrition ([place])  Discussed high calorie diet   9. Chronic pain syndrome  - HYDROcodone-acetaminophen (NORCO/VICODIN) 5-325 MG  tablet; Take 1 tablet by mouth 3 (three) times daily as needed for moderate pain. Fill 02/04/2022  Dispense: 90 tablet; Refill: 0 - HYDROcodone-acetaminophen (NORCO/VICODIN) 5-325 MG tablet; Take 1 tablet by mouth 3 (three) times daily as needed for moderate pain. To last one month  Dispense: 90 tablet; Refill: 0 - HYDROcodone-acetaminophen (NORCO/VICODIN) 5-325 MG tablet; Take 1 tablet by mouth 3 (three) times daily as needed for moderate pain. To last one month  Dispense: 90 tablet; Refill: 0 - Drug Monitor,Opiates,Screen,Urine  10. Degenerative disc disease, thoracic  - HYDROcodone-acetaminophen (NORCO/VICODIN) 5-325 MG tablet; Take 1 tablet by mouth 3 (three) times daily as needed for moderate pain. Fill 02/04/2022  Dispense: 90 tablet; Refill: 0 - HYDROcodone-acetaminophen (NORCO/VICODIN) 5-325 MG tablet; Take 1 tablet by mouth 3 (three) times daily as needed for moderate pain. To last one month  Dispense: 90 tablet; Refill: 0 - HYDROcodone-acetaminophen (NORCO/VICODIN) 5-325 MG tablet; Take 1 tablet by mouth 3 (three) times daily as needed for moderate pain. To last one month  Dispense: 90 tablet; Refill: 0  11. Chronic, continuous use of opioids  - HYDROcodone-acetaminophen (NORCO/VICODIN) 5-325 MG tablet; Take 1 tablet by mouth 3 (three) times daily as needed for moderate pain. Fill 02/04/2022  Dispense: 90 tablet; Refill: 0 - HYDROcodone-acetaminophen (NORCO/VICODIN) 5-325 MG tablet; Take 1 tablet by mouth 3 (three) times daily as needed for moderate pain. To last one month  Dispense: 90 tablet; Refill: 0 - HYDROcodone-acetaminophen (NORCO/VICODIN) 5-325 MG tablet; Take 1 tablet by mouth 3 (three) times daily as needed for moderate pain. To last one month  Dispense: 90 tablet; Refill: 0 - Drug Monitor,Opiates,Screen,Urine  12. Age-related osteoporosis without current pathological fracture   13. Lung mass

## 2021-11-29 ENCOUNTER — Ambulatory Visit (INDEPENDENT_AMBULATORY_CARE_PROVIDER_SITE_OTHER): Payer: Medicare Other | Admitting: Family Medicine

## 2021-11-29 ENCOUNTER — Encounter: Payer: Self-pay | Admitting: Family Medicine

## 2021-11-29 VITALS — BP 102/64 | HR 71 | Resp 16 | Ht 60.0 in | Wt 92.0 lb

## 2021-11-29 DIAGNOSIS — M19041 Primary osteoarthritis, right hand: Secondary | ICD-10-CM

## 2021-11-29 DIAGNOSIS — M810 Age-related osteoporosis without current pathological fracture: Secondary | ICD-10-CM

## 2021-11-29 DIAGNOSIS — J431 Panlobular emphysema: Secondary | ICD-10-CM | POA: Diagnosis not present

## 2021-11-29 DIAGNOSIS — R918 Other nonspecific abnormal finding of lung field: Secondary | ICD-10-CM

## 2021-11-29 DIAGNOSIS — K219 Gastro-esophageal reflux disease without esophagitis: Secondary | ICD-10-CM

## 2021-11-29 DIAGNOSIS — J439 Emphysema, unspecified: Secondary | ICD-10-CM

## 2021-11-29 DIAGNOSIS — I5032 Chronic diastolic (congestive) heart failure: Secondary | ICD-10-CM | POA: Diagnosis not present

## 2021-11-29 DIAGNOSIS — D692 Other nonthrombocytopenic purpura: Secondary | ICD-10-CM | POA: Diagnosis not present

## 2021-11-29 DIAGNOSIS — F1190 Opioid use, unspecified, uncomplicated: Secondary | ICD-10-CM

## 2021-11-29 DIAGNOSIS — M5134 Other intervertebral disc degeneration, thoracic region: Secondary | ICD-10-CM

## 2021-11-29 DIAGNOSIS — N1831 Chronic kidney disease, stage 3a: Secondary | ICD-10-CM

## 2021-11-29 DIAGNOSIS — M19042 Primary osteoarthritis, left hand: Secondary | ICD-10-CM

## 2021-11-29 DIAGNOSIS — G894 Chronic pain syndrome: Secondary | ICD-10-CM

## 2021-11-29 DIAGNOSIS — G4736 Sleep related hypoventilation in conditions classified elsewhere: Secondary | ICD-10-CM

## 2021-11-29 DIAGNOSIS — E441 Mild protein-calorie malnutrition: Secondary | ICD-10-CM

## 2021-11-29 MED ORDER — UMECLIDINIUM-VILANTEROL 62.5-25 MCG/ACT IN AEPB: 1.0000 | INHALATION_SPRAY | Freq: Every day | RESPIRATORY_TRACT | 2 refills | Status: AC

## 2021-11-29 MED ORDER — HYDROCODONE-ACETAMINOPHEN 5-325 MG PO TABS: 1.0000 | ORAL_TABLET | Freq: Three times a day (TID) | ORAL | 0 refills | Status: DC | PRN

## 2021-11-29 MED ORDER — PANTOPRAZOLE SODIUM 20 MG PO TBEC: 20.0000 mg | DELAYED_RELEASE_TABLET | Freq: Every day | ORAL | 0 refills | Status: AC

## 2021-11-29 NOTE — Patient Instructions (Signed)
High-Protein and High-Calorie Diet Eating high-protein and high-calorie foods can help you to gain weight, heal after an injury, and recover after an illness or surgery. The specific amount of daily protein and calories you need depends on: Your body weight. The reason this diet is recommended for you. Generally, a high-protein, high-calorie diet involves: Eating 250-500 extra calories each day. Making sure that you get enough of your daily calories from protein. Ask your health care provider how many of your calories should come from protein. Talk with a health care provider or a dietitian about how much protein and how many calories you need each day. Follow the diet as directed by your health care provider. What are tips for following this plan? Reading food labels Check the nutrition facts label for calories, grams of fat and protein. Items with more than 4 grams of protein are high-protein foods. Preparing meals Add whole milk, half-and-half, or heavy cream to cereal, pudding, soup, or hot cocoa. Add whole milk to instant breakfast drinks. Add peanut butter to oatmeal or smoothies. Add powdered milk to baked goods, smoothies, or milkshakes. Add powdered milk, cream, or butter to mashed potatoes. Add cheese to cooked vegetables. Make whole-milk yogurt parfaits. Top them with granola, fruit, or nuts. Add cottage cheese to fruit. Add avocado, cheese, or both to sandwiches or salads. Add avocado to smoothies. Add meat, poultry, or seafood to rice, pasta, casseroles, salads, and soups. Use mayonnaise when making egg salad, chicken salad, or tuna salad. Use peanut butter as a dip for fruits and vegetables or as a topping for pretzels, celery, or crackers. Add beans to casseroles, dips, and spreads. Add pureed beans to sauces and soups. Replace calorie-free drinks with calorie-containing drinks, such as milk and fruit juice. Replace water with milk or heavy cream when making foods such as  oatmeal, pudding, or cocoa. Add oil or butter to cooked vegetables and grains. Add cream cheese to sandwiches or as a topping on crackers and bread. Make cream-based pastas and soups. General information Ask your health care provider if you should take a nutritional supplement. Try to eat six small meals each day instead of three large meals. A general goal is to eat every 2 to 3 hours. Eat a balanced diet. In each meal, include one food that is high in protein and one food with fat in it. Keep nutritious snacks available, such as nuts, trail mixes, dried fruit, and yogurt. If you have kidney disease or diabetes, talk with your health care provider about how much protein is safe for you. Too much protein may put extra stress on your kidneys. Drink your calories. Choose high-calorie drinks and have them after your meals. Consider setting a timer to remind you to eat. You will want to eat even if you do not feel very hungry. What high-protein foods should I eat? Vegetables Soybeans. Peas. Grains Quinoa. Bulgur wheat. Buckwheat. Meats and other proteins Beef, pork, and poultry. Fish and seafood. Eggs. Tofu. Textured vegetable protein (TVP). Peanut butter. Nuts and seeds. Dried beans. Protein powders. Hummus. Dairy Whole milk. Whole-milk yogurt. Powdered milk. Cheese. Cottage Cheese. Eggnog. Beverages High-protein supplement drinks. Soy milk. Other foods Protein bars. The items listed above may not be a complete list of foods and beverages you can eat and drink. Contact a dietitian for more information. What high-calorie foods should I eat? Fruits Dried fruit. Fruit leather. Canned fruit in syrup. Fruit juice. Avocado. Vegetables Vegetables cooked in oil or butter. Fried potatoes. Grains Pasta. Quick   breads. Muffins. Pancakes. Ready-to-eat cereal. Meats and other proteins Peanut butter. Nuts and seeds. Dairy Heavy cream. Whipped cream. Cream cheese. Sour cream. Ice cream. Custard.  Pudding. Whole milk dairy products. Beverages Meal-replacement beverages. Nutrition shakes. Fruit juice. Seasonings and condiments Salad dressing. Mayonnaise. Alfredo sauce. Fruit preserves or jelly. Honey. Syrup. Sweets and desserts Cake. Cookies. Pie. Pastries. Candy bars. Chocolate. Fats and oils Butter or margarine. Oil. Gravy. Other foods Meal-replacement bars. The items listed above may not be a complete list of foods and beverages you can eat and drink. Contact a dietitian for more information. Summary A high-protein, high-calorie diet can help you gain weight or heal faster after an injury, illness, or surgery. To increase your protein and calories, add ingredients such as whole milk, peanut butter, cheese, beans, meat, or seafood to meal items. To get enough extra calories each day, include high-calorie foods and beverages at each meal. Adding a high-calorie drink or shake can be an easy way to help you get enough calories each day. Talk with your healthcare provider or dietitian about the best options for you. This information is not intended to replace advice given to you by your health care provider. Make sure you discuss any questions you have with your health care provider. Document Revised: 09/26/2020 Document Reviewed: 09/26/2020 Elsevier Patient Education  2022 Elsevier Inc.  

## 2021-11-30 LAB — DRUG MONITOR,OPIATES,SCREEN,URINE: Opiates: NEGATIVE ng/mL (ref <100)

## 2021-11-30 LAB — DM TEMPLATE

## 2021-12-26 ENCOUNTER — Ambulatory Visit: Payer: Medicare Other | Admitting: Family Medicine

## 2021-12-26 ENCOUNTER — Encounter: Payer: Self-pay | Admitting: Family Medicine

## 2021-12-26 ENCOUNTER — Ambulatory Visit: Payer: Self-pay | Admitting: *Deleted

## 2021-12-26 VITALS — BP 118/70 | HR 80 | Resp 16 | Ht 60.0 in | Wt 72.0 lb

## 2021-12-26 DIAGNOSIS — R1319 Other dysphagia: Secondary | ICD-10-CM

## 2021-12-26 DIAGNOSIS — R064 Hyperventilation: Secondary | ICD-10-CM | POA: Diagnosis not present

## 2021-12-26 DIAGNOSIS — E43 Unspecified severe protein-calorie malnutrition: Secondary | ICD-10-CM | POA: Diagnosis not present

## 2021-12-26 DIAGNOSIS — Z9181 History of falling: Secondary | ICD-10-CM | POA: Diagnosis not present

## 2021-12-26 DIAGNOSIS — G4736 Sleep related hypoventilation in conditions classified elsewhere: Secondary | ICD-10-CM

## 2021-12-26 DIAGNOSIS — I5032 Chronic diastolic (congestive) heart failure: Secondary | ICD-10-CM

## 2021-12-26 DIAGNOSIS — J439 Emphysema, unspecified: Secondary | ICD-10-CM

## 2021-12-26 NOTE — Telephone Encounter (Signed)
Called for dysphagia.  Reason for Disposition  [1] Symptoms of pill stuck in throat or esophagus (e.g., pain in throat or chest, FB sensation) AND [2] no relief after using CARE ADVICE   Chief Complaint: dysphagia Symptoms: solids stuck in throat Frequency: everytime eats Pertinent Negatives: Patient denies having difficulty with liquids Disposition: [] ED /[] Urgent Care (no appt availability in office) / [x] Appointment(In office/virtual)/ []  [facility]/ [] Home Care/ [] Refused Recommended Disposition /[] [facility]/ []  Follow-up with PCP Additional Notes: daughter able to take her to appt this afternoon.  Answer Assessment - Initial Assessment Questions 1. SYMPTOM: "Are you having difficulty swallowing liquids, solids, or both?"     With food, not liquids 2. ONSET: "When did the swallowing problems begin?"      A week or so 3. CAUSE: "What do you think is causing the problem?"      Doesn't now 4. CHRONIC/RECURRENT: "Is this a new problem for you?"  If no, ask: "How long have you had this problem?" (e.g., days, weeks, months)      First time she has had this problem 5. OTHER SYMPTOMS: "Do you have any other symptoms?" (e.g., difficulty breathing, sore throat, swollen tongue, chest pain)     no 6. PREGNANCY: "Is there any chance you are pregnant?" "When was your last menstrual period?"     no  Protocols used: Swallowing Difficulty-A-AH

## 2021-12-26 NOTE — Progress Notes (Addendum)
Name: Samantha Dawson   MRN: [id number]    DOB: 21-Nov-1928   Date:12/26/2021       Progress Note  Subjective  Chief Complaint  Dysphagia  HPI  Dysphagia: daughter, [name] , brought her in again today. Patient has lack of appetite for months, however over the past week she has been complaining of dysphagia to solids. Able to have liquids but very low appetite, we had to weigh her on the wheelchair and we subtracted the wheelchair weight ( found on the internet) she has lost 20 more pounds in the past month. She denies pain when swallowing but it feels like the food gets stuck mid esophagus any time she eats solids.   She has lung cancer, skin cancer ( top of head) recent MI ( 2022 ) and CHF. She has chronic respiratory failure with hypoxia. She is DNR. She is not getting treated for cancer but has follow up with oncologist next month. She states she feels tired and wants something for energy. Explained she needs to increase calorie intake to compensate for labored breathing. We discussed palliative care and she agreed on referral  Recent fall: she was trying to clean her refrigerator , she forgot to lock her walker with a bench and fell down on her bottom a few days ago. She states pain is minimal now    Patient Active Problem List   Diagnosis Date Noted   Chronic diastolic congestive heart failure ([place]) 10/06/2021   Chronic respiratory failure with hypoxia ([place]) 10/05/2021   Lung mass    Elevated troponin 09/10/2021   CHF (congestive heart failure), NYHA class IV, acute, diastolic ([place]) 24/07/7352   Thrombocytopenia ([place]) 08/29/2021   Hypertensive emergency 08/23/2021   NSTEMI (non-ST elevated myocardial infarction) ([place]) 08/18/2021   History of nonmelanoma skin cancer 06/06/2020   Senile purpura ([place]) 05/04/2020   Nocturnal hypoxemia due to emphysema ([place]) 05/04/2020   Carotid atherosclerosis, bilateral 08/27/2018   Bilateral carotid bruits 08/27/2018   CKD (chronic kidney disease)  stage 3, GFR 30-59 ml/min (HCC) 08/27/2018   Age-related osteoporosis without current pathological fracture 08/11/2018   Shoulder pain, left 08/29/2017   Acid reflux 29/92/4268   Uncomplicated opioid use 34/19/6222   Hyperglycemia 09/02/2016   Left thyroid nodule 07/13/2016   Abnormal bone scan of cervical spine 06/18/2016   Primary osteoarthritis of both hands 05/03/2016   Elevated serum alkaline phosphatase level 05/03/2016   Degenerative disc disease, thoracic 05/03/2016   Hx of smoking 04/17/2016   Controlled substance agreement signed 03/22/2016   Back pain 03/22/2016   Hyperlipidemia 10/25/2015   Essential hypertension 10/25/2015   Panlobular emphysema ([place]) 10/25/2015   Chronic pain 05/25/2015   Actinic keratosis 05/25/2015    Past Surgical History:  Procedure Laterality Date   ABDOMINAL HYSTERECTOMY     CATARACT EXTRACTION     CORONARY STENT INTERVENTION N/A 09/12/2021   Procedure: CORONARY STENT INTERVENTION;  Surgeon: Wellington Hampshire, MD;  Location: [facility];  Service: Cardiovascular;  Laterality: N/A;   endaryerectomy     LEFT HEART CATH AND CORONARY ANGIOGRAPHY N/A 09/12/2021   Procedure: LEFT HEART CATH AND CORONARY ANGIOGRAPHY;  Surgeon: Wellington Hampshire, MD;  Location: [facility];  Service: Cardiovascular;  Laterality: N/A;   renal stenting      Family History  Problem Relation Age of Onset   Cancer Mother    Coronary artery disease Father    Cerebrovascular Accident Father    Cancer Sister  breast cancer   Cancer Brother        stomach   Heart disease Daughter        heart vavle bypass?   Stroke Sister    Lung disease Brother    Cancer Brother        unknown   Arthritis Daughter     Social History   Tobacco Use   Smoking status: Former    Packs/day: 1.00    Years: 50.00    Pack years: 50.00    Types: Cigarettes    Quit date: 2017    Years since quitting: 6.1    Passive exposure: Never   Smokeless tobacco: Never    Tobacco comments:    smoking cesssation materials not required  Substance Use Topics   Alcohol use: No    Alcohol/week: 0.0 standard drinks     Current Outpatient Medications:    albuterol (PROAIR HFA) 108 (90 Base) MCG/ACT inhaler, INHALE 2 PUFFS INTO THE LUNGS EVERY 4 HOURS AS NEEDED FOR WHEEZING OR SHORTNESS OF BREATH; FUTURE REFILLS FROM LUNG DOCTOR, Disp: 8.5 g, Rfl: 3   aspirin 81 MG tablet, Take 81 mg by mouth daily., Disp: , Rfl:    furosemide (LASIX) 20 MG tablet, Take 1 tablet (20 mg total) by mouth daily. Take when you gain 2 lbs in 24 hours, Disp: 30 tablet, Rfl: 0   HYDROcodone-acetaminophen (NORCO/VICODIN) 5-325 MG tablet, Take 1 tablet by mouth 3 (three) times daily as needed for moderate pain. Fill 02/04/2022, Disp: 90 tablet, Rfl: 0   HYDROcodone-acetaminophen (NORCO/VICODIN) 5-325 MG tablet, Take 1 tablet by mouth 3 (three) times daily as needed for moderate pain. To last one month, Disp: 90 tablet, Rfl: 0   HYDROcodone-acetaminophen (NORCO/VICODIN) 5-325 MG tablet, Take 1 tablet by mouth 3 (three) times daily as needed for moderate pain. To last one month, Disp: 90 tablet, Rfl: 0   metoprolol succinate (TOPROL-XL) 50 MG 24 hr tablet, Take 50 mg by mouth daily., Disp: , Rfl:    metoprolol tartrate (LOPRESSOR) 50 MG tablet, Take 50 mg by mouth 2 (two) times daily., Disp: , Rfl:    pantoprazole (PROTONIX) 20 MG tablet, Take 1 tablet (20 mg total) by mouth daily., Disp: 180 tablet, Rfl: 0   potassium chloride SA (KLOR-CON M) 20 MEQ tablet, Take 1 tablet (20 mEq total) by mouth daily. When you take furosemide, Disp: 30 tablet, Rfl: 0   rosuvastatin (CRESTOR) 20 MG tablet, Take 20 mg by mouth daily., Disp: , Rfl:    ticagrelor (BRILINTA) 90 MG TABS tablet, Take 1 tablet (90 mg total) by mouth 2 (two) times daily., Disp: 180 tablet, Rfl: 3   umeclidinium-vilanterol (ANORO ELLIPTA) 62.5-25 MCG/ACT AEPB, Inhale 1 puff into the lungs daily at 6 (six) AM., Disp: 1 each, Rfl:  2  Allergies  Allergen Reactions   Alendronate Nausea Only    I personally reviewed active problem list, medication list, allergies, family history, social history, health maintenance with the patient/caregiver today.   ROS  Ten systems reviewed and is negative except as mentioned in HPI   Objective  Vitals:   12/26/21 1453  BP: 118/70  Pulse: 80  Resp: 16  SpO2: 97%  Weight: 72 lb (32.7 kg)  Height: 5' (1.524 m)    Body mass index is 14.06 kg/m.  Physical Exam  Constitutional: Patient appears very frail, labored breathing on oxygen  HEENT: head atraumatic, normocephalic, pupils equal and reactive to light, neck supple Skin: hyperpigmented lesion on top  of her scalp Cardiovascular: Normal rate, regular rhythm and normal heart sounds.  No murmur heard. Trace  BLE edema. Pulmonary/Chest: Effort normal and breath sounds normal. No respiratory distress. Abdominal: Soft.  There is no tenderness. Psychiatric: Patient has a normal mood and affect. behavior is normal. Judgment and thought content normal.   Recent Results (from the past 2160 hour(s))  CBC with Differential/Platelet     Status: Abnormal   Collection Time: 10/24/21  4:08 PM  Result Value Ref Range   WBC 6.9 4.0 - 10.5 K/uL   RBC 3.64 (L) 3.87 - 5.11 MIL/uL   Hemoglobin 12.0 12.0 - 15.0 g/dL   HCT 36.7 36.0 - 46.0 %   MCV 100.8 (H) 80.0 - 100.0 fL   MCH 33.0 26.0 - 34.0 pg   MCHC 32.7 30.0 - 36.0 g/dL   RDW 14.0 11.5 - 15.5 %   Platelets 197 150 - 400 K/uL   nRBC 0.0 0.0 - 0.2 %   Neutrophils Relative % 78 %   Neutro Abs 5.4 1.7 - 7.7 K/uL   Lymphocytes Relative 11 %   Lymphs Abs 0.8 0.7 - 4.0 K/uL   Monocytes Relative 11 %   Monocytes Absolute 0.8 0.1 - 1.0 K/uL   Eosinophils Relative 0 %   Eosinophils Absolute 0.0 0.0 - 0.5 K/uL   Basophils Relative 0 %   Basophils Absolute 0.0 0.0 - 0.1 K/uL   Immature Granulocytes 0 %   Abs Immature Granulocytes 0.02 0.00 - 0.07 K/uL    Comment: Performed at  [facility], [address] [place], [place] [address]  Brain natriuretic peptide     Status: Abnormal   Collection Time: 10/24/21  4:08 PM  Result Value Ref Range   B Natriuretic Peptide 1,227.7 (H) 0.0 - 100.0 pg/mL    Comment: Performed at [facility], [address] [place], [place] [address]  Basic metabolic panel     Status: Abnormal   Collection Time: 10/24/21  4:08 PM  Result Value Ref Range   Sodium 135 135 - 145 mmol/L   Potassium 3.3 (L) 3.5 - 5.1 mmol/L   Chloride 99 98 - 111 mmol/L   CO2 27 22 - 32 mmol/L   Glucose, Bld 155 (H) 70 - 99 mg/dL    Comment: Glucose reference range applies only to samples taken after fasting for at least 8 hours.   BUN 10 8 - 23 mg/dL   Creatinine, Ser 0.98 0.44 - 1.00 mg/dL   Calcium 8.9 8.9 - 10.3 mg/dL   GFR, Estimated 55 (L) >60 mL/min    Comment: (NOTE) Calculated using the CKD-EPI Creatinine Equation (2021)    Anion gap 9 5 - 15    Comment: Performed at [facility], [address] [place], [place] [address]  Novel Coronavirus, NAA ([facility])     Status: Abnormal   Collection Time: 10/25/21  3:21 AM   Specimen: Nasopharyngeal(NP) swabs in vial transport medium   Nasopharynge  Previous  Result Value Ref Range   SARS-CoV-2, NAA Detected (A) Not Detected    Comment: Patients who have a positive COVID-19 test result may now have treatment options. Treatment options are available for patients with mild to moderate symptoms and for hospitalized patients. Visit our website at [email or web address] for resources and information. This nucleic acid amplification test was developed and its performance characteristics determined by [employer]. Nucleic acid amplification tests include RT-PCR and TMA. This test has not been FDA cleared or approved.  This test has been authorized by FDA under an Emergency Use Authorization (EUA). This test is only authorized for the duration of time the  declaration that circumstances exist justifying the authorization of the emergency use of in vitro diagnostic tests for detection of SARS-CoV-2 virus and/or diagnosis of COVID-19 infection under section 564(b)(1) of the Act, 21 U.S.C. 295MWU-1(L) (1), unless the authorization is terminated or revoked sooner. When diagnostic testing is negativ e, the possibility of a false negative result should be considered in the context of a patient's recent exposures and the presence of clinical signs and symptoms consistent with COVID-19. An individual without symptoms of COVID-19 and who is not shedding SARS-CoV-2 virus would expect to have a negative (not detected) result in this assay.   SARS-COV-2, NAA 2 DAY TAT     Status: None   Collection Time: 10/25/21  3:21 AM   Nasopharynge  Previous  Result Value Ref Range   SARS-CoV-2, NAA 2 DAY TAT Performed   Drug Monitor,Opiates,Screen,Urine     Status: None   Collection Time: 11/29/21  3:34 PM  Result Value Ref Range   Opiates NEGATIVE <100 ng/mL    Comment: See Note A  DM TEMPLATE     Status: None   Collection Time: 11/29/21  3:34 PM  Result Value Ref Range   Notes and Comments      Comment: This drug testing is for medical treatment only. Analysis was performed as non-forensic testing and these results should be used only by healthcare providers to render diagnosis or treatment, or to monitor progress of medical conditions. . Note A: The results are presumptive; based only on screening methods, and they have not been confirmed by a definitive method. . . Healthcare Providers needing Interpretation assistance,  please contact us at [phone] ([phone])  M-F, 8am to 10pm EST      PHQ2/9: Depression screen [facility] 2/9 12/26/2021 11/29/2021 10/27/2021 10/24/2021 10/06/2021  Decreased Interest 0 0 0 0 0  Down, Depressed, Hopeless 0 0 0 0 0  PHQ - 2 Score 0 0 0 0 0  Altered sleeping 0 0 0 0 0  Tired, decreased energy 0 0 0 0 0   Change in appetite 0 0 0 0 0  Feeling bad or failure about yourself  0 0 0 0 0  Trouble concentrating 0 0 0 0 0  Moving slowly or fidgety/restless 0 0 0 0 0  Suicidal thoughts 0 0 0 0 0  PHQ-9 Score 0 0 0 0 0  Difficult doing work/chores - - Not difficult at all - -  Some recent data might be hidden    phq 9 is negative   Fall Risk: Fall Risk  12/26/2021 11/29/2021 10/27/2021 10/24/2021 10/06/2021  Falls in the past year? 0 0 0 0 0  Number falls in past yr: 0 0 0 0 0  Injury with Fall? 0 0 0 0 0  Comment - - - - -  Risk Factor Category  - - - - -  Risk for fall due to : Impaired balance/gait;Impaired mobility Impaired balance/gait;Impaired mobility Impaired balance/gait Impaired mobility;Impaired balance/gait Impaired mobility;Impaired balance/gait  Risk for fall due to: Comment - - - - -  Follow up Falls prevention discussed Falls prevention discussed Falls prevention discussed Falls prevention discussed Falls prevention discussed      Functional Status Survey: Is the patient deaf or have difficulty hearing?: Yes Does the patient have difficulty seeing, even when wearing glasses/contacts?: Yes Does the patient have  difficulty concentrating, remembering, or making decisions?: No Does the patient have difficulty walking or climbing stairs?: Yes Does the patient have difficulty dressing or bathing?: No Does the patient have difficulty doing errands alone such as visiting a doctor's office or shopping?: Yes    Assessment & Plan  1. Protein-calorie malnutrition, severe (HCC)  - Amb Referral to Palliative Care Discussed high calorie diet, increase booster shakes, try jello, ice cream, milk shake, smaller portions but multiple times a day   2. Labored breathing  - Amb Referral to Palliative Care  3. Esophageal dysphagia  - Amb Referral to Palliative Care Discussed barium swallow but patient prefers not doing any tests at this time   4. History of recent fall  - Amb  Referral to Palliative Care  5. Chronic diastolic congestive heart failure (HCC)  - Amb Referral to Palliative Care  Lungs are clear today, very mild ankle edema  6. Nocturnal hypoxemia due to emphysema (HCC)  - Amb Referral to Palliative Care  Continue oxygen, increase calorie intake

## 2021-12-26 NOTE — Telephone Encounter (Signed)
Reason for Disposition  [1] Symptoms of pill stuck in throat or esophagus (e.g., pain in throat or chest, FB sensation) AND [2] no relief after using CARE ADVICE  Answer Assessment - Initial Assessment Questions 1. SYMPTOM: "Are you having difficulty swallowing liquids, solids, or both?"     With food, not liquids 2. ONSET: "When did the swallowing problems begin?"      A week or so 3. CAUSE: "What do you think is causing the problem?"      Doesn't now 4. CHRONIC/RECURRENT: "Is this a new problem for you?"  If no, ask: "How long have you had this problem?" (e.g., days, weeks, months)      First time she has had this problem 5. OTHER SYMPTOMS: "Do you have any other symptoms?" (e.g., difficulty breathing, sore throat, swollen tongue, chest pain)     no 6. PREGNANCY: "Is there any chance you are pregnant?" "When was your last menstrual period?"     no  Protocols used: Swallowing Difficulty-A-AH

## 2021-12-26 NOTE — Patient Instructions (Signed)
High-Protein and High-Calorie Diet Eating high-protein and high-calorie foods can help you to gain weight, heal after an injury, and recover after an illness or surgery. The specific amount of daily protein and calories you need depends on: Your body weight. The reason this diet is recommended for you. Generally, a high-protein, high-calorie diet involves: Eating 250-500 extra calories each day. Making sure that you get enough of your daily calories from protein. Ask your health care provider how many of your calories should come from protein. Talk with a health care provider or a dietitian about how much protein and how many calories you need each day. Follow the diet as directed by your health care provider. What are tips for following this plan? Reading food labels Check the nutrition facts label for calories, grams of fat and protein. Items with more than 4 grams of protein are high-protein foods. Preparing meals Add whole milk, half-and-half, or heavy cream to cereal, pudding, soup, or hot cocoa. Add whole milk to instant breakfast drinks. Add peanut butter to oatmeal or smoothies. Add powdered milk to baked goods, smoothies, or milkshakes. Add powdered milk, cream, or butter to mashed potatoes. Add cheese to cooked vegetables. Make whole-milk yogurt parfaits. Top them with granola, fruit, or nuts. Add cottage cheese to fruit. Add avocado, cheese, or both to sandwiches or salads. Add avocado to smoothies. Add meat, poultry, or seafood to rice, pasta, casseroles, salads, and soups. Use mayonnaise when making egg salad, chicken salad, or tuna salad. Use peanut butter as a dip for fruits and vegetables or as a topping for pretzels, celery, or crackers. Add beans to casseroles, dips, and spreads. Add pureed beans to sauces and soups. Replace calorie-free drinks with calorie-containing drinks, such as milk and fruit juice. Replace water with milk or heavy cream when making foods such as  oatmeal, pudding, or cocoa. Add oil or butter to cooked vegetables and grains. Add cream cheese to sandwiches or as a topping on crackers and bread. Make cream-based pastas and soups. General information Ask your health care provider if you should take a nutritional supplement. Try to eat six small meals each day instead of three large meals. A general goal is to eat every 2 to 3 hours. Eat a balanced diet. In each meal, include one food that is high in protein and one food with fat in it. Keep nutritious snacks available, such as nuts, trail mixes, dried fruit, and yogurt. If you have kidney disease or diabetes, talk with your health care provider about how much protein is safe for you. Too much protein may put extra stress on your kidneys. Drink your calories. Choose high-calorie drinks and have them after your meals. Consider setting a timer to remind you to eat. You will want to eat even if you do not feel very hungry. What high-protein foods should I eat? Vegetables Soybeans. Peas. Grains Quinoa. Bulgur wheat. Buckwheat. Meats and other proteins Beef, pork, and poultry. Fish and seafood. Eggs. Tofu. Textured vegetable protein (TVP). Peanut butter. Nuts and seeds. Dried beans. Protein powders. Hummus. Dairy Whole milk. Whole-milk yogurt. Powdered milk. Cheese. Cottage Cheese. Eggnog. Beverages High-protein supplement drinks. Soy milk. Other foods Protein bars. The items listed above may not be a complete list of foods and beverages you can eat and drink. Contact a dietitian for more information. What high-calorie foods should I eat? Fruits Dried fruit. Fruit leather. Canned fruit in syrup. Fruit juice. Avocado. Vegetables Vegetables cooked in oil or butter. Fried potatoes. Grains Pasta. Quick   breads. Muffins. Pancakes. Ready-to-eat cereal. Meats and other proteins Peanut butter. Nuts and seeds. Dairy Heavy cream. Whipped cream. Cream cheese. Sour cream. Ice cream. Custard.  Pudding. Whole milk dairy products. Beverages Meal-replacement beverages. Nutrition shakes. Fruit juice. Seasonings and condiments Salad dressing. Mayonnaise. Alfredo sauce. Fruit preserves or jelly. Honey. Syrup. Sweets and desserts Cake. Cookies. Pie. Pastries. Candy bars. Chocolate. Fats and oils Butter or margarine. Oil. Gravy. Other foods Meal-replacement bars. The items listed above may not be a complete list of foods and beverages you can eat and drink. Contact a dietitian for more information. Summary A high-protein, high-calorie diet can help you gain weight or heal faster after an injury, illness, or surgery. To increase your protein and calories, add ingredients such as whole milk, peanut butter, cheese, beans, meat, or seafood to meal items. To get enough extra calories each day, include high-calorie foods and beverages at each meal. Adding a high-calorie drink or shake can be an easy way to help you get enough calories each day. Talk with your healthcare provider or dietitian about the best options for you. This information is not intended to replace advice given to you by your health care provider. Make sure you discuss any questions you have with your health care provider. Document Revised: 09/26/2020 Document Reviewed: 09/26/2020 Elsevier Patient Education  2022 Elsevier Inc.  

## 2021-12-28 ENCOUNTER — Inpatient Hospital Stay
Admission: EM | Admit: 2021-12-28 | Discharge: 2022-01-03 | DRG: 281 | Disposition: A | Discharge status: Hospice | Payer: Medicare Other | Attending: Internal Medicine | Admitting: Internal Medicine

## 2021-12-28 ENCOUNTER — Telehealth: Payer: Self-pay

## 2021-12-28 ENCOUNTER — Emergency Department: Payer: Medicare Other

## 2021-12-28 ENCOUNTER — Telehealth: Payer: Self-pay | Admitting: Nurse Practitioner

## 2021-12-28 ENCOUNTER — Other Ambulatory Visit: Payer: Self-pay

## 2021-12-28 DIAGNOSIS — Z66 Do not resuscitate: Secondary | ICD-10-CM | POA: Diagnosis present

## 2021-12-28 DIAGNOSIS — R54 Age-related physical debility: Secondary | ICD-10-CM | POA: Diagnosis present

## 2021-12-28 DIAGNOSIS — Z8616 Personal history of COVID-19: Secondary | ICD-10-CM | POA: Diagnosis not present

## 2021-12-28 DIAGNOSIS — K219 Gastro-esophageal reflux disease without esophagitis: Secondary | ICD-10-CM

## 2021-12-28 DIAGNOSIS — N1832 Chronic kidney disease, stage 3b: Secondary | ICD-10-CM | POA: Diagnosis present

## 2021-12-28 DIAGNOSIS — I700 Atherosclerosis of aorta: Secondary | ICD-10-CM | POA: Diagnosis present

## 2021-12-28 DIAGNOSIS — D649 Anemia, unspecified: Secondary | ICD-10-CM | POA: Diagnosis not present

## 2021-12-28 DIAGNOSIS — M19042 Primary osteoarthritis, left hand: Secondary | ICD-10-CM | POA: Diagnosis present

## 2021-12-28 DIAGNOSIS — E782 Mixed hyperlipidemia: Secondary | ICD-10-CM

## 2021-12-28 DIAGNOSIS — Z823 Family history of stroke: Secondary | ICD-10-CM

## 2021-12-28 DIAGNOSIS — Z7982 Long term (current) use of aspirin: Secondary | ICD-10-CM

## 2021-12-28 DIAGNOSIS — I130 Hypertensive heart and chronic kidney disease with heart failure and stage 1 through stage 4 chronic kidney disease, or unspecified chronic kidney disease: Secondary | ICD-10-CM | POA: Diagnosis present

## 2021-12-28 DIAGNOSIS — I5042 Chronic combined systolic (congestive) and diastolic (congestive) heart failure: Secondary | ICD-10-CM | POA: Diagnosis present

## 2021-12-28 DIAGNOSIS — Z515 Encounter for palliative care: Secondary | ICD-10-CM

## 2021-12-28 DIAGNOSIS — E860 Dehydration: Secondary | ICD-10-CM | POA: Diagnosis present

## 2021-12-28 DIAGNOSIS — I252 Old myocardial infarction: Secondary | ICD-10-CM | POA: Diagnosis not present

## 2021-12-28 DIAGNOSIS — I214 Non-ST elevation (NSTEMI) myocardial infarction: Principal | ICD-10-CM

## 2021-12-28 DIAGNOSIS — R7402 Elevation of levels of lactic acid dehydrogenase (LDH): Secondary | ICD-10-CM | POA: Diagnosis present

## 2021-12-28 DIAGNOSIS — Z8249 Family history of ischemic heart disease and other diseases of the circulatory system: Secondary | ICD-10-CM

## 2021-12-28 DIAGNOSIS — R0902 Hypoxemia: Secondary | ICD-10-CM | POA: Diagnosis not present

## 2021-12-28 DIAGNOSIS — M19041 Primary osteoarthritis, right hand: Secondary | ICD-10-CM | POA: Diagnosis present

## 2021-12-28 DIAGNOSIS — N170 Acute kidney failure with tubular necrosis: Secondary | ICD-10-CM

## 2021-12-28 DIAGNOSIS — N179 Acute kidney failure, unspecified: Secondary | ICD-10-CM

## 2021-12-28 DIAGNOSIS — J449 Chronic obstructive pulmonary disease, unspecified: Secondary | ICD-10-CM | POA: Diagnosis present

## 2021-12-28 DIAGNOSIS — I4891 Unspecified atrial fibrillation: Principal | ICD-10-CM

## 2021-12-28 DIAGNOSIS — E46 Unspecified protein-calorie malnutrition: Secondary | ICD-10-CM | POA: Diagnosis present

## 2021-12-28 DIAGNOSIS — F419 Anxiety disorder, unspecified: Secondary | ICD-10-CM | POA: Diagnosis present

## 2021-12-28 DIAGNOSIS — E785 Hyperlipidemia, unspecified: Secondary | ICD-10-CM | POA: Diagnosis present

## 2021-12-28 DIAGNOSIS — E872 Acidosis, unspecified: Secondary | ICD-10-CM | POA: Diagnosis present

## 2021-12-28 DIAGNOSIS — R079 Chest pain, unspecified: Secondary | ICD-10-CM

## 2021-12-28 DIAGNOSIS — R319 Hematuria, unspecified: Secondary | ICD-10-CM | POA: Diagnosis present

## 2021-12-28 DIAGNOSIS — I480 Paroxysmal atrial fibrillation: Secondary | ICD-10-CM | POA: Diagnosis present

## 2021-12-28 DIAGNOSIS — R1310 Dysphagia, unspecified: Secondary | ICD-10-CM | POA: Diagnosis present

## 2021-12-28 DIAGNOSIS — D638 Anemia in other chronic diseases classified elsewhere: Secondary | ICD-10-CM | POA: Diagnosis present

## 2021-12-28 DIAGNOSIS — Z803 Family history of malignant neoplasm of breast: Secondary | ICD-10-CM

## 2021-12-28 DIAGNOSIS — R7401 Elevation of levels of liver transaminase levels: Secondary | ICD-10-CM | POA: Diagnosis present

## 2021-12-28 DIAGNOSIS — Z90710 Acquired absence of both cervix and uterus: Secondary | ICD-10-CM

## 2021-12-28 DIAGNOSIS — Z681 Body mass index (BMI) 19 or less, adult: Secondary | ICD-10-CM

## 2021-12-28 DIAGNOSIS — Z7901 Long term (current) use of anticoagulants: Secondary | ICD-10-CM

## 2021-12-28 DIAGNOSIS — M810 Age-related osteoporosis without current pathological fracture: Secondary | ICD-10-CM | POA: Diagnosis present

## 2021-12-28 DIAGNOSIS — Z79899 Other long term (current) drug therapy: Secondary | ICD-10-CM

## 2021-12-28 DIAGNOSIS — Z888 Allergy status to other drugs, medicaments and biological substances status: Secondary | ICD-10-CM

## 2021-12-28 DIAGNOSIS — Z9981 Dependence on supplemental oxygen: Secondary | ICD-10-CM

## 2021-12-28 DIAGNOSIS — E876 Hypokalemia: Secondary | ICD-10-CM | POA: Diagnosis present

## 2021-12-28 DIAGNOSIS — I2510 Atherosclerotic heart disease of native coronary artery without angina pectoris: Secondary | ICD-10-CM | POA: Diagnosis present

## 2021-12-28 DIAGNOSIS — I10 Essential (primary) hypertension: Secondary | ICD-10-CM | POA: Diagnosis not present

## 2021-12-28 DIAGNOSIS — N2581 Secondary hyperparathyroidism of renal origin: Secondary | ICD-10-CM | POA: Diagnosis present

## 2021-12-28 DIAGNOSIS — H9190 Unspecified hearing loss, unspecified ear: Secondary | ICD-10-CM | POA: Diagnosis present

## 2021-12-28 DIAGNOSIS — Z87891 Personal history of nicotine dependence: Secondary | ICD-10-CM

## 2021-12-28 DIAGNOSIS — Z7189 Other specified counseling: Secondary | ICD-10-CM | POA: Diagnosis not present

## 2021-12-28 DIAGNOSIS — E8729 Other acidosis: Secondary | ICD-10-CM | POA: Diagnosis not present

## 2021-12-28 DIAGNOSIS — R809 Proteinuria, unspecified: Secondary | ICD-10-CM | POA: Diagnosis present

## 2021-12-28 DIAGNOSIS — Z955 Presence of coronary angioplasty implant and graft: Secondary | ICD-10-CM

## 2021-12-28 LAB — CBC
HCT: 31.3 % — ABNORMAL LOW (ref 36.0–46.0)
Hemoglobin: 9.9 g/dL — ABNORMAL LOW (ref 12.0–15.0)
MCH: 31.3 pg (ref 26.0–34.0)
MCHC: 31.6 g/dL (ref 30.0–36.0)
MCV: 99.1 fL (ref 80.0–100.0)
Platelets: 204 10*3/uL (ref 150–400)
RBC: 3.16 MIL/uL — ABNORMAL LOW (ref 3.87–5.11)
RDW: 14.7 % (ref 11.5–15.5)
WBC: 10.5 10*3/uL (ref 4.0–10.5)
nRBC: 0 % (ref 0.0–0.2)

## 2021-12-28 LAB — TROPONIN I (HIGH SENSITIVITY)
Troponin I (High Sensitivity): 665 ng/L (ref <18)
Troponin I (High Sensitivity): 1756 ng/L (ref <18)

## 2021-12-28 LAB — COMPREHENSIVE METABOLIC PANEL
ALT: 130 U/L — ABNORMAL HIGH (ref 0–44)
AST: 187 U/L — ABNORMAL HIGH (ref 15–41)
Albumin: 2.7 g/dL — ABNORMAL LOW (ref 3.5–5.0)
Alkaline Phosphatase: 57 U/L (ref 38–126)
Anion gap: 20 — ABNORMAL HIGH (ref 5–15)
BUN: 134 mg/dL — ABNORMAL HIGH (ref 8–23)
CO2: 17 mmol/L — ABNORMAL LOW (ref 22–32)
Calcium: 7.6 mg/dL — ABNORMAL LOW (ref 8.9–10.3)
Chloride: 101 mmol/L (ref 98–111)
Creatinine, Ser: 5.63 mg/dL — ABNORMAL HIGH (ref 0.44–1.00)
GFR, Estimated: 7 mL/min — ABNORMAL LOW (ref >60)
Glucose, Bld: 130 mg/dL — ABNORMAL HIGH (ref 70–99)
Potassium: 3.3 mmol/L — ABNORMAL LOW (ref 3.5–5.1)
Sodium: 138 mmol/L (ref 135–145)
Total Bilirubin: 1.2 mg/dL (ref 0.3–1.2)
Total Protein: 7.3 g/dL (ref 6.5–8.1)

## 2021-12-28 LAB — MAGNESIUM: Magnesium: 1.4 mg/dL — ABNORMAL LOW (ref 1.7–2.4)

## 2021-12-28 LAB — BRAIN NATRIURETIC PEPTIDE: B Natriuretic Peptide: 675.3 pg/mL — ABNORMAL HIGH (ref 0.0–100.0)

## 2021-12-28 LAB — RESP PANEL BY RT-PCR (FLU A&B, COVID) ARPGX2
Influenza A by PCR: NEGATIVE
Influenza B by PCR: NEGATIVE
SARS Coronavirus 2 by RT PCR: POSITIVE — AB

## 2021-12-28 MED ORDER — ROSUVASTATIN CALCIUM 10 MG PO TABS
20.0000 mg | ORAL_TABLET | Freq: Every day | ORAL | Status: DC
  Administered 2021-12-29 – 2022-01-02 (×5): 20 mg via ORAL
  Filled 2021-12-28 (×3): qty 2
  Filled 2021-12-28: qty 1
  Filled 2021-12-28: qty 2

## 2021-12-28 MED ORDER — ACETAMINOPHEN 325 MG PO TABS
650.0000 mg | ORAL_TABLET | Freq: Four times a day (QID) | ORAL | Status: DC | PRN
  Administered 2022-01-02: 650 mg via ORAL
  Filled 2021-12-28: qty 2

## 2021-12-28 MED ORDER — HYDROCODONE-ACETAMINOPHEN 5-325 MG PO TABS
1.0000 | ORAL_TABLET | Freq: Three times a day (TID) | ORAL | Status: DC | PRN
  Administered 2021-12-30 – 2022-01-02 (×9): 1 via ORAL
  Filled 2021-12-28 (×9): qty 1

## 2021-12-28 MED ORDER — DILTIAZEM HCL-DEXTROSE 125-5 MG/125ML-% IV SOLN (PREMIX)
5.0000 mg/h | INTRAVENOUS | Status: DC
  Filled 2021-12-28: qty 125

## 2021-12-28 MED ORDER — PANTOPRAZOLE SODIUM 20 MG PO TBEC
20.0000 mg | DELAYED_RELEASE_TABLET | Freq: Every day | ORAL | Status: DC
  Administered 2021-12-29 – 2022-01-02 (×5): 20 mg via ORAL
  Filled 2021-12-28 (×5): qty 1

## 2021-12-28 MED ORDER — METOPROLOL SUCCINATE ER 50 MG PO TB24
50.0000 mg | ORAL_TABLET | Freq: Every day | ORAL | Status: DC
  Administered 2021-12-29 – 2022-01-02 (×4): 50 mg via ORAL
  Filled 2021-12-28 (×4): qty 1

## 2021-12-28 MED ORDER — TICAGRELOR 90 MG PO TABS
90.0000 mg | ORAL_TABLET | Freq: Two times a day (BID) | ORAL | Status: DC
  Administered 2021-12-29 (×2): 90 mg via ORAL
  Filled 2021-12-28 (×2): qty 1

## 2021-12-28 MED ORDER — LACTATED RINGERS IV SOLN: INTRAVENOUS | Status: AC

## 2021-12-28 MED ORDER — UMECLIDINIUM-VILANTEROL 62.5-25 MCG/ACT IN AEPB
1.0000 | INHALATION_SPRAY | Freq: Every day | RESPIRATORY_TRACT | Status: DC
  Administered 2021-12-30 – 2022-01-02 (×4): 1 via RESPIRATORY_TRACT
  Filled 2021-12-28 (×2): qty 14

## 2021-12-28 MED ORDER — SODIUM CHLORIDE 0.9 % IV BOLUS
500.0000 mL | Freq: Once | INTRAVENOUS | Status: AC
  Administered 2021-12-28: 500 mL via INTRAVENOUS

## 2021-12-28 MED ORDER — ACETAMINOPHEN 650 MG RE SUPP: 650.0000 mg | Freq: Four times a day (QID) | RECTAL | Status: DC | PRN

## 2021-12-28 MED ORDER — ASPIRIN EC 81 MG PO TBEC
81.0000 mg | DELAYED_RELEASE_TABLET | Freq: Every day | ORAL | Status: DC
  Administered 2021-12-29: 81 mg via ORAL
  Filled 2021-12-28: qty 1

## 2021-12-28 NOTE — ED Notes (Signed)
EDP at [facility] 

## 2021-12-28 NOTE — Telephone Encounter (Signed)
Calls to patient home and daughter to schedule palliative visit. No Answer, left message on daughter's cell

## 2021-12-28 NOTE — H&P (Signed)
History and Physical    PLEASE NOTE THAT DRAGON DICTATION SOFTWARE WAS USED IN THE CONSTRUCTION OF THIS NOTE.   Samantha Dawson DBZ:[id number] DOB: 13-Jun-1929 DOA: 12/28/2021  PCP: Steele Sizer, MD  Patient coming from: home   I have personally briefly reviewed patient's old medical records in [facility]  Chief Complaint: Chest pain  HPI: Samantha Dawson is a 88 y.o. female with medical history significant for coronary artery disease status post stent placement in November 2022, hyperlipidemia, GERD, chronic systolic/diastolic heart failure, COPD, who is admitted to [facility] on 12/28/2021 with acute kidney injury after presenting from home to [facility] ED complaining of chest pain.   The patient reports sudden onset of sharp, substernal chest pain without radiation, starting on the afternoon of 12/28/2021 while at rest.  Chest pain was nonpositional, nonpleuritic, nonexertional, not reproducible with direct palpation of the anterior chest wall.  Did not take any nitroglycerin while experiencing this chest discomfort.  Chest pain was associated with palpitations, in the absence of any associated shortness of breath, nausea, vomiting, diaphoresis.  She received a full dose aspirin at home earlier today.  Chest pain remained constant for approximately an hour prior to spontaneous resolution coinciding with spontaneous conversion from atrial fibrillation with RVR to sinus rhythm while in [facility] ED.  Denies any residual or recurrent chest discomfort.    Denies any recent subjective fever, chills, rigors, or generalized myalgias.  She also denies any recent cough, abdominal pain, dysuria, gross hematuria, melena, hematochezia.  Has a known history of coronary artery disease status post NSTEMI in November 2022, with high-sensitivity troponin I noted to be elevated to 7376 on 09/12/2021, representing her most recent prior high-sensitivity troponin I data point.  On 09/12/2021, she underwent left-sided heart  cath, with stent placement and has subsequently been on dual antiplatelet therapy with ticagrelor and daily baby aspirin, with reported good associated compliance.  Cardiac history notable also for chronic systolic/diastolic heart failure, with most recent echocardiogram on 08/19/2021 notable for LVEF 35 to 40% with global left ventricular hypokinesis, grade 3 diastolic dysfunction, moderately dilated bilateral atria, trivial mitral regurgitation, trivial aortic regurgitation.  Denies any known history of paroxysmal atrial fibrillation.  Outpatient cardiac medications also include metoprolol succinate and rosuvastatin 20 mg p.o. daily.  Otherwise, she denies any recent surgical procedures.  No recent trauma, travel, or personal history of malignancy.  Denies any new peripheral swelling, erythema, calf tenderness, or hemoptysis.   Per chart review, baseline serum creatinine is around 1.0, most recent prior serum creatinine data point noted to be 0.98 on 10/24/2021.  Outpatient cardiologist is Dr. Humphrey Rolls.   The patient reportedly has been experiencing a decline in her oral intake of both food and water over the last several months.  She reiterates no recent nausea, vomiting, abdominal pain.  Of note, had recent COVID-19 positive finding per test performed on 10/25/2021.     ED Course:  Vital signs in the ED were notable for the following: Afebrile; initially noted to be in atrial fibrillation with RVR, associated heart rates in the 120s to 130s, before spontaneously converting to sinus rhythm with ensuing heart rate of 77-89; blood pressure 114/73- 130/93; respiratory rate 16-23, oxygen saturation 98 to 100% on room air.  Labs were notable for the following: CMP was notable for the following: Potassium 3.3, BUN 134, creatinine 5.63, BUN/creatinine ratio 23.8, alkaline phosphatase 57, AST 187 compared to 44 on 09/14/2021, ALT 130 compared to 19 on 09/14/2021, total bilirubin 1.2.  CBC notable for white  blood cell count 10,500, hemoglobin 9.9 compared to most recent prior value of 12 11/17/1920, with presenting hemoglobin associated normocytic/normochromic findings as well as nonelevated RDW, platelet count 204.  BNP 675 compared to most recent prior value of 1200 on 10/24/2021, high-sensitivity troponin I initially 665, with repeat value trending up to 1756, compared to 7376 on 09/12/2021.  Imaging and additional notable ED work-up: Initial EKG showed atrial fibrillation with RVR with ventricular rate 127, ST depression in leads II, 3, aVF, V4 through V6, and no evidence of ST elevation.  Repeat EKG following spontaneous conversion to sinus rhythm showed sinus rhythm with heart rate 89, no evidence of T wave changes, interval improvement in previously noted ST depression in leads III and V4, while showing ST depression in leads II and aVF which appear similar to that noted in most recent prior EKG from 09/19/2021, will demonstrating nonspecific ST depression in V5, V6.   Chest x-ray showed no evidence of cardiopulmonary process.  Patient's outpatient cardiologist, Dr. Humphrey Rolls, Is [place] out of the [place] at this time.  EDP discussed the patient's case with the on-call cardiologist from [facility] cardiology, Dr. Clayborn Bigness, Will consult, without interval recommendation for heparin drip at this time.   While in the ED, the following were administered: Normal saline x500 cc bolus.  Subsequently, the patient was admitted for further evaluation management presenting acute kidney injury as well as further evaluation and management of NSTEMI, suspected to be consistent with a type II process, as well as new diagnosis of atrial fibrillation, all in the context of initial presenting chest discomfort.    Review of Systems: As per HPI otherwise 10 point review of systems negative.   Past Medical History:  Diagnosis Date   Acid reflux 10/15/2016   Anxiety    Closed fracture of left tibial plateau 09/13/2017    Closed nondisplaced fracture of neck of right radius 09/13/2017   Controlled substance agreement signed 03/22/2016   Degenerative disc disease, thoracic 05/03/2016   Encounter for chronic pain management    Hx of smoking 04/17/2016   Hyperlipidemia    Hypertension    Macular degeneration    Osteoarthritis    Osteoporosis 03/22/2016   Primary osteoarthritis of both hands 05/03/2016    Past Surgical History:  Procedure Laterality Date   ABDOMINAL HYSTERECTOMY     CATARACT EXTRACTION     CORONARY STENT INTERVENTION N/A 09/12/2021   Procedure: CORONARY STENT INTERVENTION;  Surgeon: Wellington Hampshire, MD;  Location: [place] [facility];  Service: Cardiovascular;  Laterality: N/A;   endaryerectomy     LEFT HEART CATH AND CORONARY ANGIOGRAPHY N/A 09/12/2021   Procedure: LEFT HEART CATH AND CORONARY ANGIOGRAPHY;  Surgeon: Wellington Hampshire, MD;  Location: [place] [facility];  Service: Cardiovascular;  Laterality: N/A;   renal stenting      Social History:  reports that she quit smoking about 6 years ago. Her smoking use included cigarettes. She has a 50.00 pack-year smoking history. She has never been exposed to tobacco smoke. She has never used smokeless tobacco. She reports that she does not drink alcohol and does not use drugs.   Allergies  Allergen Reactions   Alendronate Nausea Only    Family History  Problem Relation Age of Onset   Cancer Mother    Coronary artery disease Father    Cerebrovascular Accident Father    Cancer Sister        breast cancer   Cancer Brother  stomach   Heart disease Daughter        heart vavle bypass?   Stroke Sister    Lung disease Brother    Cancer Brother        unknown   Arthritis Daughter     Family history reviewed and not pertinent    Prior to Admission medications   Medication Sig Start Date End Date Taking? Authorizing Provider  albuterol (PROAIR HFA) 108 (90 Base) MCG/ACT inhaler INHALE 2 PUFFS INTO THE LUNGS EVERY 4 HOURS AS  NEEDED FOR WHEEZING OR SHORTNESS OF BREATH; FUTURE REFILLS FROM LUNG DOCTOR 08/31/21   Steele Sizer, MD  aspirin 81 MG tablet Take 81 mg by mouth daily.    [provider]  furosemide (LASIX) 20 MG tablet Take 1 tablet (20 mg total) by mouth daily. Take when you gain 2 lbs in 24 hours 10/06/21   Steele Sizer, MD  HYDROcodone-acetaminophen (NORCO/VICODIN) 5-325 MG tablet Take 1 tablet by mouth 3 (three) times daily as needed for moderate pain. Fill 02/04/2022 11/29/21   Steele Sizer, MD  HYDROcodone-acetaminophen (NORCO/VICODIN) 5-325 MG tablet Take 1 tablet by mouth 3 (three) times daily as needed for moderate pain. To last one month 11/29/21   Steele Sizer, MD  HYDROcodone-acetaminophen (NORCO/VICODIN) 5-325 MG tablet Take 1 tablet by mouth 3 (three) times daily as needed for moderate pain. To last one month 11/29/21   Steele Sizer, MD  metoprolol succinate (TOPROL-XL) 50 MG 24 hr tablet Take 50 mg by mouth daily. 10/21/21   [provider]  metoprolol tartrate (LOPRESSOR) 50 MG tablet Take 50 mg by mouth 2 (two) times daily. 10/18/21   [provider]  pantoprazole (PROTONIX) 20 MG tablet Take 1 tablet (20 mg total) by mouth daily. 11/29/21   Steele Sizer, MD  potassium chloride SA (KLOR-CON M) 20 MEQ tablet Take 1 tablet (20 mEq total) by mouth daily. When you take furosemide 10/06/21   Steele Sizer, MD  rosuvastatin (CRESTOR) 20 MG tablet Take 20 mg by mouth daily. 08/11/21   [provider]  ticagrelor (BRILINTA) 90 MG TABS tablet Take 1 tablet (90 mg total) by mouth 2 (two) times daily. 09/17/21 09/17/22  Enzo Bi, MD  umeclidinium-vilanterol [facility] ELLIPTA) 62.5-25 MCG/ACT AEPB Inhale 1 puff into the lungs daily at 6 (six) AM. 11/29/21   Steele Sizer, MD     Objective    Physical Exam: Vitals:   12/28/21 2115 12/28/21 2130 12/28/21 2145 12/28/21 2200  BP: (!) 130/58 132/63 (!) 122/57 135/62  Pulse: 81 82 79 84  Resp: 14 18 (!) 23 19   Temp:      SpO2: 100% 100% 100% 100%    General: appears to be stated age; alert, oriented Skin: warm, dry, no rash Head:  AT/[place] Mouth:  Oral mucosa membranes appear dry, normal dentition Neck: supple; trachea midline Heart:  RRR; did not appreciate any M/R/G Lungs: CTAB, did not appreciate any wheezes, rales, or rhonchi Abdomen: + BS; soft, ND, NT Vascular: 2+ pedal pulses b/l; 2+ radial pulses b/l Extremities: no peripheral edema, no muscle wasting Neuro: strength and sensation intact in upper and lower extremities b/l    Labs on Admission: I have personally reviewed following labs and imaging studies  CBC: Recent Labs  Lab 12/28/21 1840  WBC 10.5  HGB 9.9*  HCT 31.3*  MCV 99.1  PLT 280   Basic Metabolic Panel: Recent Labs  Lab 12/28/21 1840 12/28/21 2025  NA 138  --   K  3.3*  --   CL 101  --   CO2 17*  --   GLUCOSE 130*  --   BUN 134*  --   CREATININE 5.63*  --   CALCIUM 7.6*  --   MG  --  1.4*   GFR: Estimated Creatinine Clearance: 3.4 mL/min (A) (by C-G formula based on SCr of 5.63 mg/dL (H)). Liver Function Tests: Recent Labs  Lab 12/28/21 1840  AST 187*  ALT 130*  ALKPHOS 57  BILITOT 1.2  PROT 7.3  ALBUMIN 2.7*   No results for input(s): LIPASE, AMYLASE in the last 168 hours. No results for input(s): AMMONIA in the last 168 hours. Coagulation Profile: No results for input(s): INR, PROTIME in the last 168 hours. Cardiac Enzymes: No results for input(s): CKTOTAL, CKMB, CKMBINDEX, TROPONINI in the last 168 hours. BNP (last 3 results) No results for input(s): PROBNP in the last 8760 hours. HbA1C: No results for input(s): HGBA1C in the last 72 hours. CBG: No results for input(s): GLUCAP in the last 168 hours. Lipid Profile: No results for input(s): CHOL, HDL, LDLCALC, TRIG, CHOLHDL, LDLDIRECT in the last 72 hours. Thyroid Function Tests: No results for input(s): TSH, T4TOTAL, FREET4, T3FREE, THYROIDAB in the last 72 hours. Anemia  Panel: No results for input(s): VITAMINB12, FOLATE, FERRITIN, TIBC, IRON, RETICCTPCT in the last 72 hours. Urine analysis:    Component Value Date/Time   COLORURINE YELLOW (A) 09/09/2021 1851   APPEARANCEUR CLEAR (A) 09/09/2021 1851   LABSPEC 1.013 09/09/2021 1851   PHURINE 6.0 09/09/2021 1851   GLUCOSEU NEGATIVE 09/09/2021 1851   HGBUR SMALL (A) 09/09/2021 1851   BILIRUBINUR NEGATIVE 09/09/2021 1851   [place] 09/09/2021 1851   PROTEINUR 100 (A) 09/09/2021 1851   NITRITE NEGATIVE 09/09/2021 1851   LEUKOCYTESUR NEGATIVE 09/09/2021 1851    Radiological Exams on Admission: DG Chest Port 1 View  Result Date: 12/28/2021 CLINICAL DATA:  Chest pain. EXAM: PORTABLE CHEST 1 VIEW COMPARISON:  Chest x-ray 10/24/2021 FINDINGS: The cardiac silhouette, mediastinal and hilar contours are within normal limits and stable. Chronic emphysematous changes and pulmonary scarring but no definite acute overlying pulmonary process. The bony thorax is intact. IMPRESSION: Chronic lung changes but no acute pulmonary findings. Electronically Signed   By: Marijo Sanes M.D.   On: 12/28/2021 18:53     EKG: Independently reviewed, with result as described above.    Assessment/Plan    Principal Problem:   AKI (acute kidney injury) ([place]) Active Problems:   Hyperlipidemia   Acid reflux   NSTEMI (non-ST elevated myocardial infarction) (HCC)   Chronic combined systolic and diastolic heart failure (HCC)   Chest pain   Transaminitis   Hypokalemia   Acute anemia   Paroxysmal atrial fibrillation with RVR ([place])     #) Acute kidney injury: Presenting creatinine 5.63 relative to baseline creatinine of approximately 1.0, with most recent prior serum creatinine data point noted to be 0.98 in December 2022, as further detailed above.  Suspect that this is prerenal in nature, noting concomitant presence of acute prerenal azotemia, with suspected contribution from dehydration given clinical evidence of such  in the context of report of progressive decline in oral intake of both food and water over the last few months, as further detailed above.  We will check urinalysis with distention for the presence of urinary casts, and provide gentle IV rehydration overnight, with consideration to pursue renal ultrasound versus CT abdomen if no significant interval improvement in renal function with this measure.  Plan: Monitor strict I's and O's and daily weights.  Tempt avoid nephrotoxic agents.  Check urinalysis with microscopy.  Add on random urine sodium as well as random urine creatinine.  Lactated Ringer's at 50 cc/h x 10 hours.  Repeat BMP in the morning.       #) Atypical Chest Pain: 1 episode of nonexertional chest pain that resolved spontaneously coinciding with spontaneous conversion of atrial fibrillation with RVR to sinus rhythm, in the absence of any interval nitroglycerin.  The above description appears to be atypical for ACS, with consideration for non-ACS associated rate related discomfort, potentially via a type II process in the setting of presenting atrial fibrillation with RVR, given complete resolution of patient's chest discomfort with conversion to sinus rhythm.  She has subsequently remained completely chest pain-free.  Initial EKG while in atrial fibrillation with RVR, in comparison to most recent prior EKG from 09/19/2021, shows interval resolution of ST depression as noted above, with residual nonspecific ST depression in V5/V6, without any evidence of ST elevation.  Chest x-ray shows no evidence of acute cardiopulmonary process.  This is in the context of patient with known history of CAD, status post NSTEMI in November 2022, at which time she underwent left-sided heart cath with stent placement, with good ensuing compliance via dual antiplatelet therapy with ticagrelor and daily baby aspirin, thereby reducing risk for in-stent stenosis, which likely would not of been associated with  resolution of chest pain.   However, we will continue to trend troponin, provide further evaluation management of suspected type II NSTEMI, and pursue updated echocardiogram in the morning.  Of note, Dr. Clayborn Bigness of [facility] Cardiology has been consulted, and does not recommend initiation of heparin drip at this time.  Of note, she has received a full dose aspirin earlier today, as noted above.    Plan: trend serial troponin. Monitor on telemetry. PRN sublingual nitroglycerin.  PRN EKG for subsequent episodes of chest pain. Check serum Mg level and repeat CMP in the morning, including for monitoring of mildly elevated initial transaminases. repeat CBC in the AM.  Check lipase.  Continue outpatient dual antiplatelet therapy with daily baby aspirin as well as ticagrelor.  Continue outpatient high intensity rosuvastatin.  Continue home metoprolol succinate.  Repeat echocardiogram is been ordered for the morning.  Cardiology consulted, as above.      #) NSTEMI: Mildly elevated initial troponin of 665, with repeat value trending up to 1756, with EKG showing nonspecific ST depression limited to V5/V6 following conversion of atrial fibrillation with RVR to sinus rhythm, and relative to most recent prior EKG from 09/19/2021, as further detailed above, without any evidence of ST elevation.  Suspect that this is most consistent with a type II process as a consequence of rate related physiology in the setting of presenting atrial fibrillation with RVR, along with an element of diminished creatinine clearance in the setting of presenting acute kidney injury, as further detailed below.  As described above, Dr. Clayborn Bigness of  Cardiology has been consulted, without recommendation for initiation of heparin drip at this time given suspected type II nature of patient's NSTEMI.  Plan: Cardiology consult, as above.  Echocardiogram in the morning.  Continue dual antiplatelet therapy, as above.  Continue outpatient high  intensity rosuvastatin as well as beta-blocker.  Trend troponin.  Monitor on telemetry.  Check serum magnesium level.  Further evaluation management of AKI, as further detailed below.        #) Atrial fibrillation with RVR: New diagnosis;  in the absence of any known history of paroxysmal atrial fibrillation, the patient presents with atrial fibrillation and RVR, with initial ventricular rates in the 120s to 130s, before spontaneous conversion to sinus rhythm.  Anatomically, the patient is predisposed to development of atrial fibrillation given findings from most recent echocardiogram in October 2022 which included moderately dilated bilateral atria.  We will also further evaluate for any ischemic contributions in the setting of presenting NSTEMI, as above.  No overt evidence of underlying infection, and no evidence of acutely decompensated heart failure. Of note , in setting of CHA2DS2-VASc score of 6 , criteria met for chronic anticoagulation.  Cardiology consulted, with additional recommendations pending at this time.   Plan: Monitor on telemetry.  Add on serum magnesium level.  Continue home beta-blocker for now.  Repeat echocardiogram has been ordered for the morning.  Cardiology consulted, as above.  Continue to trend troponin.  Check urinalysis.       #) Hypokalemia: Presenting serum potassium mildly low at 3.3.  We will provide gentle potassium supplementation via aforementioned lactated Ringer's, while initially refraining from aggressive potassium supplementation in the setting of presenting acute renal failure, given the associated increased risk for ensuing development of hyperkalemia.  Plan: Lactated Ringer's, as above.  Add on serum magnesium level.  Repeat BMP in the morning.  Monitor on symmetry.       #) Acute transaminitis: Mildly elevated transaminases relative to corresponding values in November 2022, in the absence of any elevation in alk phos or T. bili to suggest a  cholestatic process.  Unclear source at this time.  Not associate any abdominal discomfort.  Clinically, presentation appears less suggestive of acute cholecystitis, but will consider pursuing abdominal ultrasound versus CT abdomen pelvis depending upon interval trend in transaminases as well as renal function, as above.  As noted above, criteria for sepsis not currently met.  Plan: Repeat CMP in the morning.  Check acute viral hepatitis panel.  Check serum acetaminophen level.  Repeat CBC in the morning.  Add on lipase.       #) Recent COVID-19 infection: Patient was diagnosed with COVID-19 per positive test result on 10/25/2021.  As this positive test occurred greater than 21 days ago, but within a 74-month timeframe, there is no indication for airborne/contact precautions, nor is there an indication for repeat COVID-19 testing at this time given potential for persistent positive finding in a false positive sense.   Plan: As described above, will refrain from airborne/contact precautions, and also refrain from repeating COVID-19 testing at this time.        #) Acute anemia: Presenting hemoglobin 9.9 relative to baseline range of 11-13, with most recent prior value noted to be 12 on 10/24/2021.  No evidence of acute bleed.  Suspect that this is a consequence of presenting acute kidney injury. Will pursue additional laboratory evaluation, as further detailed below.  Plan: Add on the following to presenting labs: Iron studies, MMA, folic acid level, reticulocyte count.  Check INR.  Repeat CBC in the morning.  Further evaluation and management of presenting AKI, as further detailed above.        #) Hyperlipidemia: On high intensity rosuvastatin as an outpatient.  Plan: Continue home statin.       #) GERD: Documented history of such, on Protonix as an outpatient.   Plan: Continue Protonix.        #) COPD: Documented history in the setting of being a former smoker.   Outpatient respiratory  regimen includes an oral Ellipta as well as as needed albuterol.  No clinical evidence to suggest acute COPD exacerbation at this time.  Plan: Continue home and oral Ellipta as well as as needed albuterol.      #) Chronic systolic/diastolic heart failure: With most recent echocardiogram in October 2020, notable for LVEF 35 to 40% as well as grade 3 diastolic dysfunction, with additional details as noted above.  Patient diuretic regimen consists of Lasix 20 mg p.o. daily.  No clinical or radiographic evidence to suggest acute decompensated heart failure at this time, including noting that BNP is approximately half of the most recent prior value at the time of this evening's presentation.  Overall, patient appears clinically dehydrated, with dry oral mucous membranes, particular in setting of report of significant decline in oral intake over the last few months.  Plan: Monitor strict I's and O's and daily weights.  Hold home Lasix for now.  Add on serum magnesium level.  Repeat CMP in the morning.      DVT prophylaxis: SCD's   Code Status: DNR Disposition Plan: Per Rounding Team Consults called: Dr. Clayborn Bigness of [facility] cardiology consulted, as further detailed above;  Admission status: Inpatient; PCU    PLEASE NOTE THAT DRAGON DICTATION SOFTWARE WAS USED IN THE CONSTRUCTION OF THIS NOTE.   [place] DO [facility]   12/28/2021, 10:15 PM

## 2021-12-28 NOTE — Telephone Encounter (Signed)
Scheduled [facility] Palliative Visit for 01-10-22 at 2:30.

## 2021-12-28 NOTE — ED Triage Notes (Signed)
Pt comes with c/o CP. EMS reports CP that started and pt took 324 aspirin.  Pt was hypotensive at 86/62, Afib RVR at 160-170.  Pt states increased weakness. Pt was given 2.5 metoprolol by EMS via IV  500 fluids and 20 g in right arm, pt wears 2L chronically.

## 2021-12-28 NOTE — ED Provider Notes (Signed)
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  Patient states she started having chest pain and felt like her heart was fluttering.  Took 324 mg of aspirin.  When EMS arrived she was hypotensive at 86/62 with A-fib and RVR at about 160 to 170 bpm.  Patient states she has had increased weakness.  She was given metoprolol 2.5 mg IV.  500 mL of normal saline.  Patient states that she still has some chest pain.  She has had no fever or chills.  No cough or congestion.  No abdominal pain.  No back pain.      Physical Exam   Triage Vital Signs: ED Triage Vitals  Enc Vitals Group     BP 12/28/21 1833 (!) 130/93     Pulse Rate 12/28/21 1833 (!) 123     Resp 12/28/21 1833 (!) 22     Temp 12/28/21 1833 98 F (36.7 C)     Temp src --      SpO2 12/28/21 1833 99 %     Weight --      Height --      Head Circumference --      Peak Flow --      Pain Score 12/28/21 1832 0     Pain Loc --      Pain Edu? --      Excl. in [place]? --     Most recent vital signs: Vitals:   12/28/21 1915 12/28/21 1920  BP: 122/66   Pulse: (!) 131 86  Resp: 17 20  Temp:    SpO2: 100% 100%     General: Awake, no distress.   CV:  Good peripheral perfusion.  Tachycardic with irregular rhythm Resp:  Normal effort. Lungs CTA Abd:  No distention.  Abdomen is nontender Other:  Patient is alert and awake,   ED Results / Procedures / Treatments   Labs (all labs ordered are listed, but only abnormal results are displayed) Labs Reviewed  CBC - Abnormal; Notable for the following components:      Result Value   RBC 3.16 (*)    Hemoglobin 9.9 (*)    HCT 31.3 (*)    All other components within normal limits  COMPREHENSIVE METABOLIC PANEL  TROPONIN I (HIGH SENSITIVITY)     EKG  EKG shows  A-fib RVR   RADIOLOGY Chest x-ray    PROCEDURES:   Procedures   MEDICATIONS ORDERED IN ED: Medications  diltiazem (CARDIZEM) 125 mg in dextrose 5% 125 mL (1 mg/mL) infusion (has no administration in time range)     IMPRESSION / MDM / ASSESSMENT AND PLAN / ED COURSE  I reviewed the triage vital signs and the nursing notes.                              Differential diagnosis includes, but is not limited to, A-fib, MI, NSTEMI, aortic dissection  Labs and imaging ordered.  Will await to see if the metoprolol 2.5 improves her heart rate.  Discussed with Dr. Mliss Sax.  We will wait a few minutes and then give her additional metoprolol 2.5 if her blood pressure remains at a normal level.  EKG  shows continued A-fib RVR, see physician right  Consult cardiology, Dr. Humphrey Rolls is out of [place] per his NP Amber, states [facility]  is covering for his patients.   Consult to cardiology [facility], Dr. Clayborn Bigness states to give Cardizem, have hospitalist consult him.  Nursing staff notified me that the heart rate has dropped to 89.  We will not start Cardizem.  Care transferred to Dr. Jacqualine Code, of note patient's troponin was greatly elevated and was called by the lab to be 664.  Dr. Jacqualine Code is aware  FINAL CLINICAL IMPRESSION(S) / ED DIAGNOSES   Final diagnoses:  Rapid atrial fibrillation ([place])     Rx / DC Orders   ED Discharge Orders     None        Note:  This document was prepared using Dragon voice recognition software and may include unintentional dictation errors.    Versie Starks, PA-C 12/28/21 Radene Ou, MD 12/28/21 2032

## 2021-12-29 ENCOUNTER — Inpatient Hospital Stay
Admit: 2021-12-29 | Discharge: 2021-12-29 | Disposition: A | Discharge status: Home / Self Care | Payer: Medicare Other | Attending: Internal Medicine | Admitting: Internal Medicine

## 2021-12-29 ENCOUNTER — Inpatient Hospital Stay: Payer: Medicare Other

## 2021-12-29 DIAGNOSIS — I214 Non-ST elevation (NSTEMI) myocardial infarction: Secondary | ICD-10-CM | POA: Diagnosis not present

## 2021-12-29 LAB — COMPREHENSIVE METABOLIC PANEL
ALT: 126 U/L — ABNORMAL HIGH (ref 0–44)
AST: 198 U/L — ABNORMAL HIGH (ref 15–41)
Albumin: 2.6 g/dL — ABNORMAL LOW (ref 3.5–5.0)
Alkaline Phosphatase: 53 U/L (ref 38–126)
Anion gap: 20 — ABNORMAL HIGH (ref 5–15)
BUN: 151 mg/dL — ABNORMAL HIGH (ref 8–23)
CO2: 19 mmol/L — ABNORMAL LOW (ref 22–32)
Calcium: 8.1 mg/dL — ABNORMAL LOW (ref 8.9–10.3)
Chloride: 103 mmol/L (ref 98–111)
Creatinine, Ser: 5.55 mg/dL — ABNORMAL HIGH (ref 0.44–1.00)
GFR, Estimated: 7 mL/min — ABNORMAL LOW (ref >60)
Glucose, Bld: 95 mg/dL (ref 70–99)
Potassium: 2.6 mmol/L — CL (ref 3.5–5.1)
Sodium: 142 mmol/L (ref 135–145)
Total Bilirubin: 0.6 mg/dL (ref 0.3–1.2)
Total Protein: 6.7 g/dL (ref 6.5–8.1)

## 2021-12-29 LAB — ECHOCARDIOGRAM COMPLETE
AR max vel: 0.91 cm2
AV Area VTI: 0.91 cm2
AV Area mean vel: 0.82 cm2
AV Mean grad: 8.7 mmHg
AV Peak grad: 12.9 mmHg
Ao pk vel: 1.8 m/s
Area-P 1/2: 2.5 cm2
S' Lateral: 2.7 cm

## 2021-12-29 LAB — TROPONIN I (HIGH SENSITIVITY)
Troponin I (High Sensitivity): 16606 ng/L (ref <18)
Troponin I (High Sensitivity): 11360 ng/L (ref <18)
Troponin I (High Sensitivity): 14772 ng/L (ref <18)

## 2021-12-29 LAB — BASIC METABOLIC PANEL
Anion gap: 18 — ABNORMAL HIGH (ref 5–15)
BUN: 97 mg/dL — ABNORMAL HIGH (ref 8–23)
CO2: 18 mmol/L — ABNORMAL LOW (ref 22–32)
Calcium: 8 mg/dL — ABNORMAL LOW (ref 8.9–10.3)
Chloride: 103 mmol/L (ref 98–111)
Creatinine, Ser: 5.14 mg/dL — ABNORMAL HIGH (ref 0.44–1.00)
GFR, Estimated: 8 mL/min — ABNORMAL LOW (ref >60)
Glucose, Bld: 124 mg/dL — ABNORMAL HIGH (ref 70–99)
Potassium: 3.7 mmol/L (ref 3.5–5.1)
Sodium: 139 mmol/L (ref 135–145)

## 2021-12-29 LAB — LIPASE, BLOOD: Lipase: 374 U/L — ABNORMAL HIGH (ref 11–51)

## 2021-12-29 LAB — URINALYSIS, COMPLETE (UACMP) WITH MICROSCOPIC
Bilirubin Urine: NEGATIVE
Glucose, UA: NEGATIVE mg/dL
Ketones, ur: NEGATIVE mg/dL
Leukocytes,Ua: NEGATIVE
Nitrite: NEGATIVE
Protein, ur: 100 mg/dL — AB
Specific Gravity, Urine: 1.013 (ref 1.005–1.030)
Squamous Epithelial / HPF: NONE SEEN (ref 0–5)
pH: 5 (ref 5.0–8.0)

## 2021-12-29 LAB — CBC WITH DIFFERENTIAL/PLATELET
Abs Immature Granulocytes: 0.06 10*3/uL (ref 0.00–0.07)
Basophils Absolute: 0 10*3/uL (ref 0.0–0.1)
Basophils Relative: 0 %
Eosinophils Absolute: 0.2 10*3/uL (ref 0.0–0.5)
Eosinophils Relative: 2 %
HCT: 27.6 % — ABNORMAL LOW (ref 36.0–46.0)
Hemoglobin: 8.8 g/dL — ABNORMAL LOW (ref 12.0–15.0)
Immature Granulocytes: 1 %
Lymphocytes Relative: 10 %
Lymphs Abs: 1.1 10*3/uL (ref 0.7–4.0)
MCH: 31.4 pg (ref 26.0–34.0)
MCHC: 31.9 g/dL (ref 30.0–36.0)
MCV: 98.6 fL (ref 80.0–100.0)
Monocytes Absolute: 0.9 10*3/uL (ref 0.1–1.0)
Monocytes Relative: 9 %
Neutro Abs: 8 10*3/uL — ABNORMAL HIGH (ref 1.7–7.7)
Neutrophils Relative %: 78 %
Platelets: 180 10*3/uL (ref 150–400)
RBC: 2.8 MIL/uL — ABNORMAL LOW (ref 3.87–5.11)
RDW: 15.1 % (ref 11.5–15.5)
WBC: 10.2 10*3/uL (ref 4.0–10.5)
nRBC: 0 % (ref 0.0–0.2)

## 2021-12-29 LAB — FERRITIN: Ferritin: 327 ng/mL — ABNORMAL HIGH (ref 11–307)

## 2021-12-29 LAB — IRON AND TIBC
Iron: 61 ug/dL (ref 28–170)
Saturation Ratios: 24 % (ref 10.4–31.8)
TIBC: 258 ug/dL (ref 250–450)
UIBC: 197 ug/dL

## 2021-12-29 LAB — PROTIME-INR
INR: 1.2 (ref 0.8–1.2)
Prothrombin Time: 15 seconds (ref 11.4–15.2)

## 2021-12-29 LAB — ACETAMINOPHEN LEVEL: Acetaminophen (Tylenol), Serum: <10 ug/mL — ABNORMAL LOW (ref 10–30)

## 2021-12-29 LAB — APTT: aPTT: 62 seconds — ABNORMAL HIGH (ref 24–36)

## 2021-12-29 LAB — FOLATE: Folate: 10.3 ng/mL (ref >5.9)

## 2021-12-29 LAB — HEPATITIS PANEL, ACUTE
HCV Ab: NONREACTIVE
Hep A IgM: NONREACTIVE
Hep B C IgM: NONREACTIVE
Hepatitis B Surface Ag: NONREACTIVE

## 2021-12-29 LAB — SODIUM, URINE, RANDOM: Sodium, Ur: 75 mmol/L

## 2021-12-29 LAB — RETICULOCYTES
Immature Retic Fract: 15.4 % (ref 2.3–15.9)
RBC.: 2.77 MIL/uL — ABNORMAL LOW (ref 3.87–5.11)
Retic Count, Absolute: 41.6 10*3/uL (ref 19.0–186.0)
Retic Ct Pct: 1.5 % (ref 0.4–3.1)

## 2021-12-29 LAB — CREATININE, URINE, RANDOM: Creatinine, Urine: 48 mg/dL

## 2021-12-29 LAB — MAGNESIUM: Magnesium: 1.6 mg/dL — ABNORMAL LOW (ref 1.7–2.4)

## 2021-12-29 LAB — HEPARIN LEVEL (UNFRACTIONATED): Heparin Unfractionated: 0.26 IU/mL — ABNORMAL LOW (ref 0.30–0.70)

## 2021-12-29 MED ORDER — POTASSIUM CHLORIDE CRYS ER 20 MEQ PO TBCR
40.0000 meq | EXTENDED_RELEASE_TABLET | ORAL | Status: AC
  Administered 2021-12-29 (×2): 40 meq via ORAL
  Filled 2021-12-29 (×2): qty 2

## 2021-12-29 MED ORDER — SODIUM CHLORIDE 0.9 % IV SOLN: INTRAVENOUS | Status: DC

## 2021-12-29 MED ORDER — CLOPIDOGREL BISULFATE 75 MG PO TABS
300.0000 mg | ORAL_TABLET | Freq: Once | ORAL | Status: AC
  Administered 2021-12-29: 300 mg via ORAL
  Filled 2021-12-29: qty 4

## 2021-12-29 MED ORDER — POTASSIUM CHLORIDE 10 MEQ/100ML IV SOLN
10.0000 meq | INTRAVENOUS | Status: DC
  Filled 2021-12-29: qty 100

## 2021-12-29 MED ORDER — HEPARIN BOLUS VIA INFUSION
2000.0000 [IU] | Freq: Once | INTRAVENOUS | Status: AC
  Administered 2021-12-29: 2000 [IU] via INTRAVENOUS
  Filled 2021-12-29: qty 2000

## 2021-12-29 MED ORDER — MELATONIN 5 MG PO TABS: 5.0000 mg | ORAL_TABLET | Freq: Every day | ORAL | Status: DC

## 2021-12-29 MED ORDER — AMIODARONE HCL 200 MG PO TABS
200.0000 mg | ORAL_TABLET | Freq: Two times a day (BID) | ORAL | Status: DC
  Administered 2021-12-29 – 2022-01-02 (×9): 200 mg via ORAL
  Filled 2021-12-29 (×9): qty 1

## 2021-12-29 MED ORDER — MELATONIN 5 MG PO TABS
5.0000 mg | ORAL_TABLET | Freq: Every evening | ORAL | Status: DC | PRN
  Administered 2021-12-29 – 2022-01-01 (×4): 5 mg via ORAL
  Filled 2021-12-29 (×4): qty 1

## 2021-12-29 MED ORDER — MAGNESIUM SULFATE 2 GM/50ML IV SOLN
2.0000 g | Freq: Once | INTRAVENOUS | Status: AC
  Administered 2021-12-29: 2 g via INTRAVENOUS
  Filled 2021-12-29: qty 50

## 2021-12-29 MED ORDER — HEPARIN (PORCINE) 25000 UT/250ML-% IV SOLN
650.0000 [IU]/h | INTRAVENOUS | Status: DC
  Administered 2021-12-29: 400 [IU]/h via INTRAVENOUS
  Administered 2021-12-31: 650 [IU]/h via INTRAVENOUS
  Filled 2021-12-29 (×2): qty 250

## 2021-12-29 MED ORDER — CLOPIDOGREL BISULFATE 75 MG PO TABS
75.0000 mg | ORAL_TABLET | Freq: Every day | ORAL | Status: DC
  Administered 2021-12-30 – 2022-01-02 (×4): 75 mg via ORAL
  Filled 2021-12-29 (×4): qty 1

## 2021-12-29 MED ORDER — ONDANSETRON HCL 4 MG/2ML IJ SOLN
4.0000 mg | Freq: Once | INTRAMUSCULAR | Status: AC
  Administered 2021-12-29: 4 mg via INTRAVENOUS
  Filled 2021-12-29: qty 2

## 2021-12-29 NOTE — Assessment & Plan Note (Signed)
Blood pressure within goal.  On Lasix and metoprolol at home. -Holding Lasix for AKI -Continue with metoprolol

## 2021-12-29 NOTE — ED Notes (Signed)
Hospital provider updated on repeat low magnesium. No new orders.

## 2021-12-29 NOTE — Assessment & Plan Note (Addendum)
Patient admitted with chest pain, upward trending troponin which peaked at 16, 606 and now started trending down.  Chest pain has been resolved.  Numbers consistent with NSTEMI.  Recent history of NSTEMI s/p PCI in November 2022, compliant with aspirin and Brilinta.   Cardiology has been consulted. Her cardiologist Dr. Manuella Ghazi is out of country-Dr. Clayborn Bigness will be covering. -Heparin infusion -Brilinta was switched with Plavix by cardiology to decrease the risk of bleeding -Continue to trend troponin -Will not be a candidate for cardiac catheter at this time due to AKI -Continue with metoprolol -Continue with Crestor -Palliative care consult

## 2021-12-29 NOTE — Hospital Course (Addendum)
Taken from H&P.  Samantha Dawson is a 88 y.o. female with medical history significant for coronary artery disease status post stent placement in November 2022, hyperlipidemia, GERD, chronic systolic/diastolic heart failure, COPD, who is admitted to [facility] on 12/28/2021 with acute kidney injury after presenting from home to [facility] ED complaining of chest pain.  It was sudden onset sharp, substernal chest pain without any radiation, no exertional component, nonpleuritic.  Pain was associated with some palpitations, no shortness of breath, nausea or vomiting.  She received a full dose of aspirin at home earlier and pain improved in about an hour.  On arrival to ED she was also found to have new onset A-fib with RVR, spontaneous conversion to sinus rhythm while she was in ED.  Labs pertinent for rising troponin, which peaked at 16, 606, AKI with creatinine of 5.5, baseline around 1 in December 2022, lipase elevated at 374, potassium of 2.6, magnesium 1.6 and mild transaminitis.  COVID-19 was positive with CT value of 43.0 which consistent with prior infection.  Apparently she was positive at the end of December 2022.  No need for any treatment or isolation.  Prior echocardiogram with EF of 35 to 40% and global hypokinesis, grade 3 diastolic dysfunction,moderately dilated bilateral atria, trivial mitral regurgitation, trivial aortic regurgitation.  Cardiology was consulted and she was started on heparin infusion by cardiology.  Per daughter patient is experiencing declining in her health since her COVID-19 infection, tested positive on 10/25/2021.  Recently PCP advised to follow-up with palliative care.  Nephrology was consulted due to persistent AKI with GFR of 7, normal in December.  Renal ultrasound without any obstruction, did show renal cyst.  No bladder outlet obstruction, remained oliguric.  Palliative care was also consulted, patient is not ready for hospice discussion.  They might consider short-term  dialysis.  No final decision yet. PT is recommending SNF.  2/25: Patient with worsening BUN, slight improvement in creatinine.  Worsening acidosis.  Had another long discussion between nephrology and patient, patient decided to consider hospice and does not want dialysis.  Cardiology discontinued heparin.  IV fluid switched with bicarb and she also received one-time dose of Lasix.  Repeat chest x-ray with worsening pulmonary vascular congestion.  2/26: Renal function with some improvement today.  Having some cough.  2/27: Patient with worsening back pain and hypoxia.  She was placed on heated high flow at 45 L of oxygen, 60% FiO2 overnight.  She seems very dusky, pale and in distress when seen this morning.  Concern of an another heart attack, family will avoid more lab work and decided to proceed with comfort measures only.  We will avoid more sticks and comfort measures started. She is being evaluated for home hospice-awaiting final recommendations.  Patient with multiorgan dysfunction.  Very high risk for deterioration and death.

## 2021-12-29 NOTE — Progress Notes (Signed)
Progress Note   Patient: Samantha Dawson ZDG:[id number] DOB: Feb 21, 1929 DOA: 12/28/2021     1 DOS: the patient was seen and examined on 12/29/2021   Brief hospital course: Taken from H&P.  KAIRIE VANGIESON is a 88 y.o. female with medical history significant for coronary artery disease status post stent placement in November 2022, hyperlipidemia, GERD, chronic systolic/diastolic heart failure, COPD, who is admitted to [facility] on 12/28/2021 with acute kidney injury after presenting from home to [facility] ED complaining of chest pain.  It was sudden onset sharp, substernal chest pain without any radiation, no exertional component, nonpleuritic.  Pain was associated with some palpitations, no shortness of breath, nausea or vomiting.  She received a full dose of aspirin at home earlier and pain improved in about an hour.  On arrival to ED she was also found to have new onset A-fib with RVR, spontaneous conversion to sinus rhythm while she was in ED.  Labs pertinent for rising troponin, which peaked at 16, 606, AKI with creatinine of 5.5, baseline around 1 in December 2022, lipase elevated at 374, potassium of 2.6, magnesium 1.6 and mild transaminitis.  COVID-19 was positive with CT value of 43.0 which consistent with prior infection.  Apparently she was positive at the end of December 2022.  No need for any treatment or isolation.  Prior echocardiogram with EF of 35 to 40% and global hypokinesis, grade 3 diastolic dysfunction,moderately dilated bilateral atria, trivial mitral regurgitation, trivial aortic regurgitation.  Cardiology was consulted and she was started on heparin infusion by cardiology.  Per daughter patient is experiencing declining in her health since her COVID-19 infection, tested positive on 10/25/2021.  Recently PCP advised to follow-up with palliative care.  Palliative care was also consulted. Patient is very high risk for deterioration and death based on age and underlying comorbidities  along with presenting NSTEMI and AKI.  Patient has multiorgan failure at this time.  Assessment and Plan: * NSTEMI (non-ST elevated myocardial infarction) ([place])- (present on admission) Patient admitted with chest pain, upward trending troponin which peaked at 16, 606 and now started trending down.  Chest pain has been resolved.  Numbers consistent with NSTEMI.  Recent history of NSTEMI s/p PCI in November 2022, compliant with aspirin and Brilinta.   Cardiology has been consulted. Her cardiologist Dr. Manuella Ghazi is out of country-Dr. Clayborn Bigness will be covering. -Heparin infusion -Brilinta was switched with Plavix by cardiology to decrease the risk of bleeding -Continue to trend troponin -Will not be a candidate for cardiac catheter at this time due to AKI -Continue with metoprolol -Continue with Crestor -Palliative care consult  AKI (acute kidney injury) ([place])- (present on admission) Creatinine at 5.55 this morning, baseline around 1 in December 2022.  No contrast exposure. Renal ultrasound with no hydronephrosis or nephrolithiasis.  Bilateral stable cyst. -Concern of cardiorenal due to NSTEMI. -Poor p.o. intake can be contributory -Give her some gentle fluid -Monitor renal function  Chronic combined systolic and diastolic heart failure ([place])- (present on admission) EF of 35 to 40% with global hypokinesis according to prior echo.  Repeat echo ordered and pending.  Clinically appears euvolemic. -Holding diuretic due to AKI -Giving some gentle IV fluid -Monitor volume status closely -Daily BMP and weight -Strict intake and output  Paroxysmal atrial fibrillation with RVR ([place])- (present on admission) New onset,CHA2DS2-VASc score of 6. Spontaneous conversion to sinus rhythm. Cardiology started her on amiodarone. Currently on heparin infusion. -Continue to monitor -Continue with metoprolol  Hypokalemia- (present on admission) Potassium  of 2.6 with magnesium of 1.6. -Replete potassium and  magnesium and monitor  Transaminitis- (present on admission) Can be due to cardiohepatic reason. -Hepatitis viral panel-pending -Acetaminophen level-pending  Essential hypertension- (present on admission) Blood pressure within goal.  On Lasix and metoprolol at home. -Holding Lasix for AKI -Continue with metoprolol  Acute anemia- (present on admission) Hemoglobin at 8.8 this morning without any obvious bleeding.  Baseline around 12 in December.  Patient was on aspirin and Brilinta at home.  Poor p.o. intake. Anemia panel with abnormally normal reticulocyte, more consistent with anemia of chronic disease. -Monitor hemoglobin -Transfuse if below 8  Acid reflux- (present on admission) - Continue with Protonix  Hyperlipidemia- (present on admission) - Continue with Crestor   Subjective: Patient was seen and examined today.  Denies any more chest pain or shortness of breath.  Very hard of hearing.  Daughter at bedside.  Physical Exam: Vitals:   12/29/21 0900 12/29/21 1000 12/29/21 1100 12/29/21 1400  BP: (!) 143/101 137/63 (!) 135/106 (!) 130/51  Pulse: 77 72 71 66  Resp: 16 15 20 20   Temp:      SpO2: 100% 100% 99% 100%   General.  Frail elderly lady in no acute distress. Pulmonary.  Lungs clear bilaterally, normal respiratory effort. CV.  Regular rate and rhythm, no JVD, rub or murmur. Abdomen.  Soft, nontender, nondistended, BS positive. CNS.  Alert and oriented .  No focal neurologic deficit. Extremities.  No edema, no cyanosis, pulses intact and symmetrical. Psychiatry.  Judgment and insight appears normal.  Data Reviewed: I reviewed prior notes, labs and images  Family Communication: Discussed with daughter at bedside , also talked with other daughter on phone while in the room.  Disposition: Status is: Inpatient Remains inpatient appropriate because: Severity of illness   Planned Discharge Destination: To be determined  DVT prophylaxis.  Heparin infusion  Time  spent: 52 minutes  This record has been created using Systems analyst. Errors have been sought and corrected,but may not always be located. Such creation errors do not reflect on the standard of care.  Author: Lorella Nimrod, MD 12/29/2021 3:17 PM  For on call review [email or web address].

## 2021-12-29 NOTE — Progress Notes (Signed)
Patient reports weakness and difficulty doing ADLs at home. Patient also reports 10-15 lb weight loss with difficulty swallowing pills and some food with poor appetite. Lives at home independently with family support.  Dr. Reesa Chew paged and consult for PT/OT/Speech/Dietician placed by this RN with readback.

## 2021-12-29 NOTE — Assessment & Plan Note (Signed)
-   Continue with Protonix 

## 2021-12-29 NOTE — Assessment & Plan Note (Signed)
Creatinine at 5.55 this morning, baseline around 1 in December 2022.  No contrast exposure. Renal ultrasound with no hydronephrosis or nephrolithiasis.  Bilateral stable cyst. -Concern of cardiorenal due to NSTEMI. -Poor p.o. intake can be contributory -Give her some gentle fluid -Monitor renal function

## 2021-12-29 NOTE — Consult Note (Signed)
ANTICOAGULATION CONSULT NOTE - Initial Consult  Pharmacy Consult for Heparin drip Indication:  NSTEMI  Allergies  Allergen Reactions   Alendronate Nausea Only    Patient Measurements:  Wt 32.7kg  Ht 60 inch Heparin Dosing Weight: 32.7kg  Vital Signs: BP: 135/106 (02/23 1100) Pulse Rate: 71 (02/23 1100)  Labs: Recent Labs    12/28/21 1840 12/28/21 2025 12/29/21 0555 12/29/21 1050  HGB 9.9*  --  8.8*  --   HCT 31.3*  --  27.6*  --   PLT 204  --  180  --   LABPROT  --   --  15.0  --   INR  --   --  1.2  --   CREATININE 5.63*  --  5.55*  --   TROPONINIHS 665* 1,756* 16,606* 11,360*    Estimated Creatinine Clearance: 3.5 mL/min (A) (by C-G formula based on SCr of 5.55 mg/dL (H)).   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   Encounter for chronic pain management    Hx of smoking 04/17/2016   Hyperlipidemia    Hypertension    Macular degeneration    Osteoarthritis    Osteoporosis 03/22/2016   Primary osteoarthritis of both hands 05/03/2016    Medications:  Scheduled:   amiodarone  200 mg Oral BID   clopidogrel  300 mg Oral Once   [START ON 12/30/2021] clopidogrel  75 mg Oral Daily   metoprolol succinate  50 mg Oral Q breakfast   pantoprazole  20 mg Oral Q breakfast   potassium chloride  40 mEq Oral Q4H   rosuvastatin  20 mg Oral Daily   [START ON 12/30/2021] umeclidinium-vilanterol  1 puff Inhalation Daily    Assessment: Patient admitted with chest pain. PMH includes NSTEMI 09/2021 s/p PCI with DES to proximal LAD and residual chronic occlusion of proximal/mid LCx, HFrEF (LVEF 35-40% w/ global hypo, G3 DD, mild MS, mild to mod aortic sclerosis/calc 08/2021), hyperlipidemia, COPD on chronic  oxygen.  No anticoagulant PTA. Troponin peak at 16,606  Baseline labs: Hgb 8.8 Plt 180 INR 1.2   Goal of Therapy:  Heparin level 0.3-0.7 units/ml Monitor platelets by anticoagulation protocol: Yes   Plan:  Give 2000 units bolus x 1 Start heparin infusion at 400 units/hr Check anti-Xa level in 8 hours and daily while on heparin Continue to monitor H&H and platelets  Ashly Goethe Rodriguez-Guzman PharmD, BCPS 12/29/2021 1:12 PM

## 2021-12-29 NOTE — Progress Notes (Signed)
Dr. Reesa Chew paged and notified that Troponin trending back up. Troponin result now 14,772.  No new orders given to RN at this time.

## 2021-12-29 NOTE — Assessment & Plan Note (Signed)
Can be due to cardiohepatic reason. -Hepatitis viral panel-pending -Acetaminophen level-pending

## 2021-12-29 NOTE — Assessment & Plan Note (Signed)
Potassium of 2.6 with magnesium of 1.6. -Replete potassium and magnesium and monitor

## 2021-12-29 NOTE — Assessment & Plan Note (Signed)
New onset,CHA2DS2-VASc score of 6. Spontaneous conversion to sinus rhythm. Cardiology started her on amiodarone. Currently on heparin infusion. -Continue to monitor -Continue with metoprolol

## 2021-12-29 NOTE — Assessment & Plan Note (Signed)
Hemoglobin at 8.8 this morning without any obvious bleeding.  Baseline around 12 in December.  Patient was on aspirin and Brilinta at home.  Poor p.o. intake. Anemia panel with abnormally normal reticulocyte, more consistent with anemia of chronic disease. -Monitor hemoglobin -Transfuse if below 8

## 2021-12-29 NOTE — Assessment & Plan Note (Signed)
EF of 35 to 40% with global hypokinesis according to prior echo.  Repeat echo ordered and pending.  Clinically appears euvolemic. -Holding diuretic due to AKI -Giving some gentle IV fluid -Monitor volume status closely -Daily BMP and weight -Strict intake and output

## 2021-12-29 NOTE — Consult Note (Signed)
ANTICOAGULATION CONSULT NOTE  Pharmacy Consult for Heparin drip Indication:  NSTEMI  Allergies  Allergen Reactions   Alendronate Nausea Only    Patient Measurements:  Wt 32.7kg  Ht 60 inch Heparin Dosing Weight: 32.7kg  Vital Signs: BP: 130/51 (02/23 1400) Pulse Rate: 66 (02/23 1400)  Labs: Recent Labs    12/28/21 1840 12/28/21 2025 12/29/21 0555 12/29/21 1050  HGB 9.9*  --  8.8*  --   HCT 31.3*  --  27.6*  --   PLT 204  --  180  --   LABPROT  --   --  15.0  --   INR  --   --  1.2  --   CREATININE 5.63*  --  5.55*  --   TROPONINIHS 665* 1,756* 16,606* 11,360*     Estimated Creatinine Clearance: 3.5 mL/min (A) (by C-G formula based on SCr of 5.55 mg/dL (H)).   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   Encounter for chronic pain management    Hx of smoking 04/17/2016   Hyperlipidemia    Hypertension    Macular degeneration    Osteoarthritis    Osteoporosis 03/22/2016   Primary osteoarthritis of both hands 05/03/2016    Medications:  Scheduled:   amiodarone  200 mg Oral BID   clopidogrel  300 mg Oral Once   [START ON 12/30/2021] clopidogrel  75 mg Oral Daily   metoprolol succinate  50 mg Oral Q breakfast   pantoprazole  20 mg Oral Q breakfast   rosuvastatin  20 mg Oral Daily   [START ON 12/30/2021] umeclidinium-vilanterol  1 puff Inhalation Daily    Assessment: Patient admitted with chest pain. PMH includes NSTEMI 09/2021 s/p PCI with DES to proximal LAD and residual chronic occlusion of proximal/mid LCx, HFrEF (LVEF 35-40% w/ global hypo, G3 DD, mild MS, mild to mod aortic sclerosis/calc 08/2021), hyperlipidemia, COPD on chronic oxygen.  No anticoagulant PTA. Troponin peak at  16,606  Baseline labs: Hgb 8.8 Plt 180 INR 1.2   Goal of Therapy:  Heparin level 0.3-0.7 units/ml Monitor platelets by anticoagulation protocol: Yes   Plan:  Heparin level slightly subtherapeutic:  increase heparin infusion rate to 450 units/hr Check anti-Xa level in 8 hours after rate change Continue to monitor H&H and platelets  Vallery Sa PharmD, BCPS 12/29/2021 2:33 PM

## 2021-12-29 NOTE — Progress Notes (Signed)
[place] Started on melatonin as needed for sleep per patient request for sleep aid

## 2021-12-29 NOTE — Assessment & Plan Note (Signed)
Continue with Crestor 

## 2021-12-29 NOTE — ED Notes (Signed)
Called to lab to ensure magnesium in process. Provider updated.

## 2021-12-29 NOTE — Consult Note (Cosign Needed)
[place] NOTE       Patient ID: MIRIAN CASCO MRN: [id number] DOB/AGE: Aug 17, 1929 88 y.o.  Admit date: 12/28/2021 Referring Physician Dr. Lorella Nimrod Primary Physician Dr. Steele Sizer Primary Cardiologist Dr. Humphrey Rolls Reason for Consultation chest pain, new onset AF  HPI: The patient is a 75yoF with PMH notable for CAD - NSTEMI 09/2021 s/p PCI with DES to proximal LAD and residual chronic occlusion of proximal/mid LCx, HFrEF (LVEF 35-40% w/ global hypo, G3 DD, mild MS, mild to mod aortic sclerosis/calc 08/2021), hyperlipidemia, COPD on chronic oxygen who presented to [facility] ED 2/22 with chest pain and was found to be in A-fib with RVR, has an uptrending troponin to current peak of 16,000 this morning and an acute kidney injury with current GFR of 7.  Cardiology is consulted for further assistance.  The patient presents with her daughter and her niece who contribute to the history.  Her daughter states that the patient has been sick for about a week, having difficulty walking with increased weakness and poor oral intake and having trouble swallowing solid food.  She presented to her primary care provider on 2/20 and recommendations for palliative care consult were made and in addition to increasing high calorie liquid foods.  She presented to [facility] ED after complaints of sudden onset of chest pain and worsening shortness of breath from her baseline.  Chest pain was sharp and substernal and associated with palpitations.  It did not radiate and occurred while she was at rest.  She was found to be in atrial fibrillation with rapid rate of 160-170 by EMS and hypotensive at 86/62.  She was given 2.5 of metoprolol by IV and a 500 mL bolus of normal saline prior to arrival.  She spontaneously converted to normal rhythm while she was in the ED and has had no further chest discomfort since then.  The patient states this morning she has had no further chest pain, shortness of breath,  just feels "weak.  She is incidentally COVID-positive.  Remains in normal sinus rhythm with a rate of 89.  Her labs are notable for uptrending troponin initially 665, 1756, and now 16,606 with repeats pending.  Additionally, she has multiple electrolyte disturbances of a potassium of 2.6, magnesium 1.6 but are being replenished.  Creatinine is 5.55 and GFR 7, baseline from December was 0.98 and 55.  She had recent PCI to her proximal LAD and November 2022 and reports compliance with her ticagrelor and aspirin therapy.  Review of systems complete and found to be negative unless listed above     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   Encounter for chronic pain management    Hx of smoking 04/17/2016   Hyperlipidemia    Hypertension    Macular degeneration    Osteoarthritis    Osteoporosis 03/22/2016   Primary osteoarthritis of both hands 05/03/2016    Past Surgical History:  Procedure Laterality Date   ABDOMINAL HYSTERECTOMY     CATARACT EXTRACTION     CORONARY STENT INTERVENTION N/A 09/12/2021   Procedure: CORONARY STENT INTERVENTION;  Surgeon: Wellington Hampshire, MD;  Location: [place] [facility];  Service: Cardiovascular;  Laterality: N/A;   endaryerectomy     LEFT HEART  CATH AND CORONARY ANGIOGRAPHY N/A 09/12/2021   Procedure: LEFT HEART CATH AND CORONARY ANGIOGRAPHY;  Surgeon: Wellington Hampshire, MD;  Location: [place] [facility];  Service: Cardiovascular;  Laterality: N/A;   renal stenting      (Not in a hospital admission)  Social History   Socioeconomic History   Marital status: Widowed    Spouse name: Ihor Gully   Number of children: 3   Years of education: Not on file   Highest education level: 10th grade  Occupational History    Occupation: Retired  Tobacco Use   Smoking status: Former    Packs/day: 1.00    Years: 50.00    Pack years: 50.00    Types: Cigarettes    Quit date: 2017    Years since quitting: 6.1    Passive exposure: Never   Smokeless tobacco: Never   Tobacco comments:    smoking cesssation materials not required  Vaping Use   Vaping Use: Never used  Substance and Sexual Activity   Alcohol use: No    Alcohol/week: 0.0 standard drinks   Drug use: No   Sexual activity: Not Currently    Birth control/protection: None  Other Topics Concern   Not on file  Social History Narrative   Pt lives alone   Social Determinants of Health   Financial Resource Strain: Low Risk    Difficulty of Paying Living Expenses: Not hard at all  Food Insecurity: No Food Insecurity   Worried About Charity fundraiser in the Last Year: Never true   [place] in the Last Year: Never true  Transportation Needs: No Transportation Needs   Lack of Transportation (Medical): No   Lack of Transportation (Non-Medical): No  Physical Activity: Inactive   Days of Exercise per Week: 0 days   Minutes of Exercise per Session: 0 min  Stress: No Stress Concern Present   Feeling of Stress : Only a little  Social Connections: Moderately Isolated   Frequency of Communication with Friends and Family: More than three times a week   Frequency of Social Gatherings with Friends and Family: More than three times a week   Attends Religious Services: More than 4 times per year   Active Member of [employer] or Organizations: No   Attends Archivist Meetings: Never   Marital Status: Widowed  Human resources officer Violence: Not At Risk   Fear of Current or Ex-Partner: No   Emotionally Abused: No   Physically Abused: No   Sexually Abused: No    Family History  Problem Relation Age of Onset   Cancer Mother    Coronary artery disease Father    Cerebrovascular Accident Father    Cancer Sister        breast cancer   Cancer  Brother        stomach   Heart disease Daughter        heart vavle bypass?   Stroke Sister    Lung disease Brother    Cancer Brother        unknown   Arthritis Daughter       Review of systems complete and found to be negative unless listed above    PHYSICAL EXAM General: Elderly and frail-appearing Caucasian female, in no acute distress.  Sitting upright in hospital bed with daughter and niece at bedside. HEENT:  Normocephalic and atraumatic.  Hard of hearing Neck:  No JVD.  Lungs: Normal respiratory effort on oxygen by nasal cannula. Clear bilaterally  to auscultation. No wheezes, crackles, rhonchi.  Heart: HRRR . Normal S1 and S2 without gallops.  3/6 systolic murmur best heard at the left upper sternal border. Radial & DP pulses 2+ bilaterally. Abdomen: Non-distended appearing.  Msk: Normal strength and tone for age. Extremities: Warm and well perfused. No clubbing, cyanosis.  No peripheral edema.  Neuro: Alert and oriented X 3. Psych:  Answers questions appropriately.   Labs:   Lab Results  Component Value Date   WBC 10.2 12/29/2021   HGB 8.8 (L) 12/29/2021   HCT 27.6 (L) 12/29/2021   MCV 98.6 12/29/2021   PLT 180 12/29/2021    Recent Labs  Lab 12/29/21 0555  NA 142  K 2.6*  CL 103  CO2 19*  BUN 151*  CREATININE 5.55*  CALCIUM 8.1*  PROT 6.7  BILITOT 0.6  ALKPHOS 53  ALT 126*  AST 198*  GLUCOSE 95   Lab Results  Component Value Date   TROPONINI 0.34 (HH) 09/03/2016    Lab Results  Component Value Date   CHOL 113 08/19/2021   CHOL 140 11/30/2020   CHOL 143 11/25/2019   Lab Results  Component Value Date   HDL 60 08/19/2021   HDL 73 11/30/2020   HDL 69 11/25/2019   Lab Results  Component Value Date   LDLCALC 46 08/19/2021   LDLCALC 48 11/30/2020   LDLCALC CANCELED 11/30/2020   Lab Results  Component Value Date   TRIG 36 08/19/2021   TRIG 103 11/30/2020   TRIG 81 11/25/2019   Lab Results  Component Value Date   CHOLHDL 1.9 08/19/2021    CHOLHDL 1.9 11/30/2020   CHOLHDL 2.1 11/25/2019   No results found for: LDLDIRECT    Radiology: US RENAL  Result Date: 12/29/2021 CLINICAL DATA:  Acute kidney injury EXAM: RENAL / URINARY TRACT ULTRASOUND COMPLETE COMPARISON:  Sonogram dated September 11, 2021 FINDINGS: Right Kidney: Renal measurements: 9.8 x 5.4 x 4.9 = volume: 135 mL. Echogenicity within normal limits. No mass or hydronephrosis visualized. Simple cyst on the lateral aspect of the kidney measuring 1.4 x 1.3 x 1.5 cm and another upper pole cyst measuring 1.1 x 0.8 x 1.0 cm. Left Kidney: Renal measurements: 8.8 x 4.1 x 4.7 = volume: 89.8 mL. Echogenicity within normal limits. No mass or hydronephrosis visualized. Anechoic avascular cyst in the lower pole measuring 1.1 x 1.4 x 1.4 cm. Another anechoic avascular cyst measuring 2.3 x 1.9 x 2.3 cm in the interpolar region. Bladder: Appears normal for degree of bladder distention. Other: None. IMPRESSION: 1.  No evidence of nephrolithiasis or hydronephrosis. 2.  Bilateral simple renal cysts, unchanged. Electronically Signed   By: Keane Police D.O.   On: 12/29/2021 10:08   DG Chest Port 1 View  Result Date: 12/28/2021 CLINICAL DATA:  Chest pain. EXAM: PORTABLE CHEST 1 VIEW COMPARISON:  Chest x-ray 10/24/2021 FINDINGS: The cardiac silhouette, mediastinal and hilar contours are within normal limits and stable. Chronic emphysematous changes and pulmonary scarring but no definite acute overlying pulmonary process. The bony thorax is intact. IMPRESSION: Chronic lung changes but no acute pulmonary findings. Electronically Signed   By: Marijo Sanes M.D.   On: 12/28/2021 18:53    ECHO LVEF 35-40% w/ global hypo, G3 DD, mild MS, mild to mod aortic sclerosis/calc 08/2021  TELEMETRY reviewed by me: Sinus rhythm rate 89  EKG reviewed by me: Initially A-fib with rapid rate of 127, spontaneously converted to NSR with rate of 89 with persistent ST depressions in V5  V6  ASSESSMENT AND PLAN:  The  patient is a 38yoF with PMH notable for CAD - NSTEMI 09/2021 s/p PCI with DES to proximal LAD and residual chronic occlusion of proximal/mid LCx, HFrEF (LVEF 35-40% w/ global hypo, G3 DD, mild MS, mild to mod aortic sclerosis/calc 08/2021), hyperlipidemia, COPD on chronic oxygen who presented to [facility] ED 2/22 with chest pain and was found to be in A-fib with RVR, has an uptrending troponin to current peak of 16,000 this morning and an acute kidney injury with current GFR of 7.  Cardiology is consulted for further assistance.  #New onset AF with RVR, spontaneously converted to NSR #atypical chest pain #Elevated troponin #Acute kidney injury #COVID-19 The patient presented with sudden onset of 1 episode of nonexertional chest pain and worsening shortness of breath in the setting of feeling "weak" with poor oral intake x1 week.  She was found to be in atrial fibrillation by EMS and spontaneously converted to normal rhythm after receiving 1 dose of 2.5 mg metoprolol tartrate.  She is also incidentally COVID-positive on admission.  She is chest pain-free since her conversion to normal rhythm however, her troponin peaked at 16606 this morning.  She has a significant kidney injury with current GFR of 7 and creatinine 5.55 (baseline 0.98 and GFR 55). Very reluctant to attempt any invasive procedures d/t her very poor renal function and overall debility.  -Agree with supportive care for COVID-19 and gentle fluids for AKI -Remains in normal sinus rhythm during interview.  -Ordering amiodarone 200mg  BID x 7 days and transition to 200mg  once daily therafter for maintenance of NSR -S/p 324 mg aspirin. Ordered plavix 300mg  x1 today and 75mg  plavix therafter. D/c brillinta to decrease bleeding risk  -Heparin gtt per pharmacy protocol with goals to transition to low dose eliquis for anticoagulation in the short term for new onset afib as her renal function hopefully improves.  -Continue metoprolol succinate 50  mg -CHADSVASC 6 -echocardiogram complete -Repeat EKG with recurrent chest pain  #electrolyte disturbances Agree with replacement of Mg and K+ Initially Mg 1.4-1.6 and K+ 2.6  #CAD - NSTEMI 09/2021 s/p PCI with DES to proximal LAD residual disease  plan as above  #HFrEF (LVEF 35-40%) Currently appears euvolemic if not on the dry side Intolerant of GDMT other than metoprolol d/t hypotention. Holding lasix 20mg . Agree with gentle fluid hydration  This patient's plan of care was discussed and created with Dr. Lujean Amel and he is in agreement.  Signed: Tristan Schroeder , PA-C 12/29/2021, 10:26 AM [facility] Cardiology

## 2021-12-30 DIAGNOSIS — Z66 Do not resuscitate: Secondary | ICD-10-CM

## 2021-12-30 DIAGNOSIS — Z7189 Other specified counseling: Secondary | ICD-10-CM

## 2021-12-30 DIAGNOSIS — I10 Essential (primary) hypertension: Secondary | ICD-10-CM | POA: Diagnosis not present

## 2021-12-30 DIAGNOSIS — I5042 Chronic combined systolic (congestive) and diastolic (congestive) heart failure: Secondary | ICD-10-CM | POA: Diagnosis not present

## 2021-12-30 DIAGNOSIS — N179 Acute kidney failure, unspecified: Secondary | ICD-10-CM | POA: Diagnosis not present

## 2021-12-30 DIAGNOSIS — R079 Chest pain, unspecified: Secondary | ICD-10-CM | POA: Diagnosis not present

## 2021-12-30 DIAGNOSIS — I214 Non-ST elevation (NSTEMI) myocardial infarction: Secondary | ICD-10-CM | POA: Diagnosis not present

## 2021-12-30 DIAGNOSIS — Z515 Encounter for palliative care: Secondary | ICD-10-CM

## 2021-12-30 LAB — COMPREHENSIVE METABOLIC PANEL
ALT: 127 U/L — ABNORMAL HIGH (ref 0–44)
AST: 161 U/L — ABNORMAL HIGH (ref 15–41)
Albumin: 2.5 g/dL — ABNORMAL LOW (ref 3.5–5.0)
Alkaline Phosphatase: 53 U/L (ref 38–126)
Anion gap: 17 — ABNORMAL HIGH (ref 5–15)
BUN: 148 mg/dL — ABNORMAL HIGH (ref 8–23)
CO2: 17 mmol/L — ABNORMAL LOW (ref 22–32)
Calcium: 8.3 mg/dL — ABNORMAL LOW (ref 8.9–10.3)
Chloride: 108 mmol/L (ref 98–111)
Creatinine, Ser: 5.14 mg/dL — ABNORMAL HIGH (ref 0.44–1.00)
GFR, Estimated: 8 mL/min — ABNORMAL LOW (ref >60)
Glucose, Bld: 107 mg/dL — ABNORMAL HIGH (ref 70–99)
Potassium: 4.3 mmol/L (ref 3.5–5.1)
Sodium: 142 mmol/L (ref 135–145)
Total Bilirubin: 0.5 mg/dL (ref 0.3–1.2)
Total Protein: 6.7 g/dL (ref 6.5–8.1)

## 2021-12-30 LAB — MAGNESIUM: Magnesium: 2.1 mg/dL (ref 1.7–2.4)

## 2021-12-30 LAB — CBC
HCT: 26.2 % — ABNORMAL LOW (ref 36.0–46.0)
Hemoglobin: 8.4 g/dL — ABNORMAL LOW (ref 12.0–15.0)
MCH: 31 pg (ref 26.0–34.0)
MCHC: 32.1 g/dL (ref 30.0–36.0)
MCV: 96.7 fL (ref 80.0–100.0)
Platelets: 207 10*3/uL (ref 150–400)
RBC: 2.71 MIL/uL — ABNORMAL LOW (ref 3.87–5.11)
RDW: 15.4 % (ref 11.5–15.5)
WBC: 9.7 10*3/uL (ref 4.0–10.5)
nRBC: 0 % (ref 0.0–0.2)

## 2021-12-30 LAB — HEPARIN LEVEL (UNFRACTIONATED)
Heparin Unfractionated: 0.15 IU/mL — ABNORMAL LOW (ref 0.30–0.70)
Heparin Unfractionated: 0.24 IU/mL — ABNORMAL LOW (ref 0.30–0.70)

## 2021-12-30 LAB — TROPONIN I (HIGH SENSITIVITY): Troponin I (High Sensitivity): 8917 ng/L (ref <18)

## 2021-12-30 LAB — PHOSPHORUS: Phosphorus: 8.7 mg/dL — ABNORMAL HIGH (ref 2.5–4.6)

## 2021-12-30 MED ORDER — IPRATROPIUM-ALBUTEROL 0.5-2.5 (3) MG/3ML IN SOLN
3.0000 mL | RESPIRATORY_TRACT | Status: DC | PRN
Start: 2021-12-30 — End: 2021-12-31
  Administered 2021-12-30 (×3): 3 mL via RESPIRATORY_TRACT
  Filled 2021-12-30 (×3): qty 3

## 2021-12-30 MED ORDER — HEPARIN BOLUS VIA INFUSION
1000.0000 [IU] | Freq: Once | INTRAVENOUS | Status: AC
  Administered 2021-12-30: 1000 [IU] via INTRAVENOUS
  Filled 2021-12-30: qty 1000

## 2021-12-30 MED ORDER — ENSURE ENLIVE PO LIQD
237.0000 mL | Freq: Three times a day (TID) | ORAL | Status: DC
  Administered 2021-12-31 – 2022-01-01 (×4): 237 mL via ORAL

## 2021-12-30 MED ORDER — ADULT MULTIVITAMIN W/MINERALS CH
1.0000 | ORAL_TABLET | Freq: Every day | ORAL | Status: DC
  Administered 2021-12-30 – 2022-01-02 (×4): 1 via ORAL
  Filled 2021-12-30 (×4): qty 1

## 2021-12-30 NOTE — Evaluation (Addendum)
Clinical/Bedside Swallow Evaluation Patient Details  Name: Samantha Dawson MRN: [id number] Date of Birth: 31-Dec-1928  Today's Date: 12/30/2021 Time: SLP Start Time (ACUTE ONLY): 1010 SLP Stop Time (ACUTE ONLY): 1110 SLP Time Calculation (min) (ACUTE ONLY): 60 min  Past Medical History:  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   Encounter for chronic pain management    Hx of smoking 04/17/2016   Hyperlipidemia    Hypertension    Macular degeneration    Osteoarthritis    Osteoporosis 03/22/2016   Primary osteoarthritis of both hands 05/03/2016   Past Surgical History:  Past Surgical History:  Procedure Laterality Date   ABDOMINAL HYSTERECTOMY     CATARACT EXTRACTION     CORONARY STENT INTERVENTION N/A 09/12/2021   Procedure: CORONARY STENT INTERVENTION;  Surgeon: Wellington Hampshire, MD;  Location: [place] [facility];  Service: Cardiovascular;  Laterality: N/A;   endaryerectomy     LEFT HEART CATH AND CORONARY ANGIOGRAPHY N/A 09/12/2021   Procedure: LEFT HEART CATH AND CORONARY ANGIOGRAPHY;  Surgeon: Wellington Hampshire, MD;  Location: [place] [facility];  Service: Cardiovascular;  Laterality: N/A;   renal stenting     HPI:  Pt  is a 88 y.o. female with medical history significant for coronary artery disease status post stent placement in November 2022, hyperlipidemia, GERD, chronic systolic/diastolic heart failure, COPD, who is admitted to [facility] on 12/28/2021 with acute kidney injury after presenting from home to [facility] ED complaining of chest pain.  The patient reports sudden onset of sharp, substernal chest pain without radiation, starting on the afternoon of 12/28/2021 while at rest.  Per Cardiology, she was found to be in atrial fibrillation  by EMS and spontaneously converted to normal rhythm after receiving 1 dose of 2.5 mg metoprolol tartrate.  She is also incidentally COVID-positive on admission but has been positive ongoing since testing in December - ID has ruled out need for isolation per NSG.  She is chest pain-free since her conversion to normal rhythm however, her troponin peaked at 16,606 this morning.  She has a significant kidney injury with current GFR of 7 and creatinine 5.55 (baseline 0.98 and GFR 55). Very reluctant to attempt any invasive procedures d/t her very poor renal function and overall debility.  Pt's appetite has been poor in past ~1 week; reduced oral intake in general for a little while per family report.  Pt endorses the Globus feeling pointing to her mid-sternum area.   CXR at admit: Chronic lung changes but no acute pulmonary findings.    Assessment / Plan / Recommendation  Clinical Impression  Pt seen for BSE this AM; education re: swallowing/dysphagia, diet consistency and preparation, general aspiration precautions, and REFLUX/GERD precautions. Pt has a Baseline of Esophageal dysmotility per pt and family report. Discussed w/ Daughters present, and pt, a diet consistency of Mech Soft w/ recommendation for Chopped meats/foods for ease of oral intake, mastication, as well as ease of Esophageal clearing. Discussed Esophageal dysmotility and precautions w/ pt and Daughters present.  Recommend f/u w/ GI if need for further assessment/tx for Esophageal/GI Dysmotility.  She consumed trials of thin liquids via Cup, puree, and mech soft trials w/ No overt s/s of aspiration noted; no decline in vocal quality or respiratory  status, no coughing, no wet vocal quality. Discussed strategies and precautions for reducing her risk for aspiration including Small sips Slowly but also strategies to reduce GERD/REFLUX issues including LESS straw use(less air swallowed), less carbonated drinks and bulky foods such as meats/breads but  if eating then to moisten well. Also discussed food consistencies and options, preparation, and use of condiments and soups to soften and moisten foods. Encouraged breakdown of all foods and Pills CRUSHED in puree to ease Esophageal phase clearing.     The above was discussed w/ Daughter/pt; Handouts on diet consistency recommended as well as aspiration precautions and Pill swallowing options discussed. Discussed the benefit of the Chopped meats consistency diet for easier intake overall and ease of Esophageal clearing. Recommended f/u w/ Dietician for support.   Recommend a Dysphagia level 3 (w/ chopped meats) diet w/ Thin liquids via Cup. Aspiration precautions; Reflux precautions; tray setup and feeding support d/t Vision deficits. Pill Crushed in Puree for safer swallowing. Education completed w/ pt and Daughters. Pt is being consulted by Palliative Care today. Recommend continue f/u for [place] support moving forward. NSG to reconsult if new needs arise while admitted.   Dietician f/u for support. SLP Visit Diagnosis: Dysphagia, unspecified (R13.10)    Aspiration Risk  Mild aspiration risk;Risk for inadequate nutrition/hydration    Diet Recommendation   Dysphagia level 3 (w/ chopped meats) diet w/ Thin liquids via Cup. Aspiration precautions; Reflux precautions; tray setup and feeding support d/t Vision deficits.   Medication Administration: Crushed with puree    Other  Recommendations Recommended Consults: Consider GI evaluation;Consider esophageal assessment (Dietician f/u; Palliative Care f/u) Oral Care Recommendations: Oral care BID;Oral care before and after PO;Staff/trained caregiver to provide oral care (denture care) Other Recommendations:  (n/a)    Recommendations for follow up therapy are one component of a multi-disciplinary discharge planning process, led by the attending physician.  Recommendations may be updated based on patient status, additional functional criteria and insurance  authorization.  Follow up Recommendations No SLP follow up      Assistance Recommended at Discharge Frequent or constant Supervision/Assistance (vision deficits)  Functional Status Assessment Patient has had a recent decline in their functional status and/or demonstrates limited ability to make significant improvements in function in a reasonable and predictable amount of time  Frequency and Duration  (n/a)   (n/a)       Prognosis Prognosis for Safe Diet Advancement: Fair Barriers to Reach Goals: Time post onset;Severity of deficits Barriers/Prognosis Comment: Esophageal phase dysmotility      Swallow Study   General Date of Onset: 12/28/21 HPI: Pt  is a 88 y.o. female with medical history significant for coronary artery disease status post stent placement in November 2022, hyperlipidemia, GERD, chronic systolic/diastolic heart failure, COPD, who is admitted to [facility] on 12/28/2021 with acute kidney injury after presenting from home to [facility] ED complaining of chest pain.  The patient reports sudden onset of sharp, substernal chest pain without radiation, starting on the afternoon of 12/28/2021 while at rest.  Per Cardiology, she was found to be in atrial fibrillation by EMS and spontaneously converted to normal rhythm after receiving 1 dose of 2.5 mg metoprolol tartrate.  She is also incidentally COVID-positive on admission but has been positive ongoing since testing in December - ID has ruled out need for isolation per NSG.  She is chest pain-free since her conversion to normal rhythm however, her troponin peaked at 16,606 this morning.  She has a significant kidney injury with  current GFR of 7 and creatinine 5.55 (baseline 0.98 and GFR 55). Very reluctant to attempt any invasive procedures d/t her very poor renal function and overall debility.  Pt's appetite has been poor in past ~1 week; reduced oral intake in general for a little while per family report.  Pt endorses the Globus feeling pointing  to her mid-sternum area.   CXR at admit: Chronic lung changes but no acute pulmonary findings. Type of Study: Bedside Swallow Evaluation Previous Swallow Assessment: none Diet Prior to this Study: Regular;Thin liquids Temperature Spikes Noted: No (wbc 9.7) Respiratory Status: Nasal cannula (2L) History of Recent Intubation: No Behavior/Cognition: Alert;Cooperative;Pleasant mood;Distractible;Requires cueing Oral Cavity Assessment: Within Functional Limits Oral Care Completed by SLP: Recent completion by staff Oral Cavity - Dentition: Dentures, top;Dentures, bottom (needs denture care) Vision: Impaired for self-feeding (baseline -- gets support from family) Self-Feeding Abilities: Able to feed self;Needs assist;Needs set up;Total assist Patient Positioning: Upright in bed (needed some positioning support) Baseline Vocal Quality: Low vocal intensity (adequate) Volitional Cough: Strong Volitional Swallow: Able to elicit    Oral/Motor/Sensory Function Overall Oral Motor/Sensory Function: Within functional limits   Ice Chips Ice chips: Not tested   Thin Liquid Presentation: Self Fed;Cup (10+ trials) Other Comments: water, coffee    Nectar Thick Nectar Thick Liquid: Not tested   Honey Thick Honey Thick Liquid: Not tested   Puree Puree: Within functional limits Presentation: Self Fed;Spoon (supported; 10 trials)   Solid     Solid: Within functional limits (grossly; supported -- 8 trials of fruit cocktail) Presentation: Self Fed;Spoon        Orinda Kenner, MS, CCC-SLP Speech Language Pathologist Rehab Services; [place] [phone] (ascom) Skylur Fuston 12/30/2021,11:42 AM

## 2021-12-30 NOTE — Consult Note (Signed)
[facility]  CONSULT NOTE    Date: 12/30/2021                  Patient Name:  Samantha Dawson  MRN: [id number]  DOB: [birth year]  Age / Sex: 88 y.o., female         PCP: Steele Sizer, MD                 Service Requesting Consult: [place]                 Reason for Consult: Acute kidney injury            History of Present Illness: Samantha Dawson is a 88 y.o.  female with previous medical conditions including CAD, COPD, GERD, hyperlipidemia, and chronic systolic and diastolic heart failure, who was admitted to [facility] on 12/28/2021 for Rapid atrial fibrillation ([place]) [I48.91] AKI (acute kidney injury) [facility]) [N17.9]  Patient presents to the emergency department with complaints of chest pain.  Patient reports weakness, nausea, vomiting a couple days prior to this event.  According to family, a daughter and granddaughter at bedside, patient has had poor appetite for at least 2 weeks.  States patient complained of throat pain with swallowing however patient states she was able to continue to consume fluids.  She denies pain or discomfort from any other area.  States she had gotten so weak she was unable to get out of recliner.  States her baseline oxygen requirement is 2 L but denies any shortness of breath beyond that.  Currently lives alone in a single-family home.  2 daughters who live in the area check on her frequently.  Patient and daughter states she has recently been diagnosed with a lung mass, further work-up scheduled in the coming weeks.  Labs on arrival show elevated troponin at 665, potassium 3.3, CO2 17, BUN 134, calcium 7.6, and creatinine 5.63 with GFR 7.  Respiratory panel negative for influenza but positive for COVID-19.  UA with some hematuria and proteinuria.  Renal ultrasound negative for obstruction but shows bilateral simple cyst.  Echo shows EF 55 to 60% with a grade 2 diastolic dysfunction.   Medications: Outpatient medications: Medications  Prior to Admission  Medication Sig Dispense Refill Last Dose   aspirin 81 MG tablet Take 81 mg by mouth daily.   12/28/2021   HYDROcodone-acetaminophen (NORCO/VICODIN) 5-325 MG tablet Take 1 tablet by mouth 3 (three) times daily as needed for moderate pain. Fill 02/04/2022 90 tablet 0 Past Week   isosorbide mononitrate (IMDUR) 30 MG 24 hr tablet Take 30 mg by mouth daily.   12/28/2021   metoprolol succinate (TOPROL-XL) 50 MG 24 hr tablet Take 50 mg by mouth daily.   12/28/2021   pantoprazole (PROTONIX) 20 MG tablet Take 1 tablet (20 mg total) by mouth daily. 180 tablet 0 12/28/2021   ticagrelor (BRILINTA) 90 MG TABS tablet Take 1 tablet (90 mg total) by mouth 2 (two) times daily. 180 tablet 3 12/28/2021   albuterol (PROAIR HFA) 108 (90 Base) MCG/ACT inhaler INHALE 2 PUFFS INTO THE LUNGS EVERY 4 HOURS AS NEEDED FOR WHEEZING OR SHORTNESS OF BREATH; FUTURE REFILLS FROM LUNG DOCTOR (Patient not taking: Reported on 12/29/2021) 8.5 g 3 Not Taking   furosemide (LASIX) 20 MG tablet Take 1 tablet (20 mg total) by mouth daily. Take when you gain 2 lbs in 24 hours 30 tablet 0 prn at unknown   HYDROcodone-acetaminophen (NORCO/VICODIN) 5-325 MG tablet Take 1 tablet  by mouth 3 (three) times daily as needed for moderate pain. To last one month 90 tablet 0    HYDROcodone-acetaminophen (NORCO/VICODIN) 5-325 MG tablet Take 1 tablet by mouth 3 (three) times daily as needed for moderate pain. To last one month 90 tablet 0    metoprolol tartrate (LOPRESSOR) 50 MG tablet Take 50 mg by mouth 2 (two) times daily. (Patient not taking: Reported on 12/28/2021)   Not Taking   nitroGLYCERIN (NITROSTAT) 0.4 MG SL tablet Place 0.4 mg under the tongue every 5 (five) minutes as needed for chest pain.   prn at prn   potassium chloride SA (KLOR-CON M) 20 MEQ tablet Take 1 tablet (20 mEq total) by mouth daily. When you take furosemide 30 tablet 0 prn at unknown   rosuvastatin (CRESTOR) 20 MG tablet Take 20 mg by mouth daily.   12/27/2021    umeclidinium-vilanterol (ANORO ELLIPTA) 62.5-25 MCG/ACT AEPB Inhale 1 puff into the lungs daily at 6 (six) AM. (Patient not taking: Reported on 12/29/2021) 1 each 2 Not Taking    Current medications: Current Facility-Administered Medications  Medication Dose Route Frequency Provider Last Rate Last Admin   0.9 %  sodium chloride infusion   Intravenous Continuous Lorella Nimrod, MD 50 mL/hr at 12/30/21 0756 Infusion Verify at 12/30/21 0756   acetaminophen (TYLENOL) tablet 650 mg  650 mg Oral Q6H PRN Howerter, Justin B, DO       Or   acetaminophen (TYLENOL) suppository 650 mg  650 mg Rectal Q6H PRN Howerter, Justin B, DO       amiodarone (PACERONE) tablet 200 mg  200 mg Oral BID Tristan Schroeder, PA-C   200 mg at 12/30/21 0920   clopidogrel (PLAVIX) tablet 75 mg  75 mg Oral Daily Tristan Schroeder, PA-C   75 mg at 12/30/21 0920   heparin ADULT infusion 100 units/mL (25000 units/293mL)  550 Units/hr Intravenous Continuous Wynelle Cleveland, RPH 5.5 mL/hr at 12/30/21 0925 550 Units/hr at 12/30/21 0925   HYDROcodone-acetaminophen (NORCO/VICODIN) 5-325 MG per tablet 1 tablet  1 tablet Oral TID PRN Howerter, Justin B, DO       ipratropium-albuterol (DUONEB) 0.5-2.5 (3) MG/3ML nebulizer solution 3 mL  3 mL Nebulization Q4H PRN Sharion Settler, NP   3 mL at 12/30/21 1004   melatonin tablet 5 mg  5 mg Oral QHS PRN Sharion Settler, NP   5 mg at 12/29/21 2248   metoprolol succinate (TOPROL-XL) 24 hr tablet 50 mg  50 mg Oral Q breakfast Howerter, Justin B, DO   50 mg at 12/30/21 0920   pantoprazole (PROTONIX) EC tablet 20 mg  20 mg Oral Q breakfast Howerter, Justin B, DO   20 mg at 12/30/21 0920   rosuvastatin (CRESTOR) tablet 20 mg  20 mg Oral Daily Howerter, Justin B, DO   20 mg at 12/30/21 0919   umeclidinium-vilanterol (ANORO ELLIPTA) 62.5-25 MCG/ACT 1 puff  1 puff Inhalation Daily Howerter, Justin B, DO          Allergies: Allergies  Allergen Reactions   Alendronate Nausea Only      Past  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   Encounter for chronic pain management    Hx of smoking 04/17/2016  Hyperlipidemia    Hypertension    Macular degeneration    Osteoarthritis    Osteoporosis 03/22/2016   Primary osteoarthritis of both hands 05/03/2016     Past Surgical History: Past Surgical History:  Procedure Laterality Date   ABDOMINAL HYSTERECTOMY     CATARACT EXTRACTION     CORONARY STENT INTERVENTION N/A 09/12/2021   Procedure: CORONARY STENT INTERVENTION;  Surgeon: Wellington Hampshire, MD;  Location: [place] [facility];  Service: Cardiovascular;  Laterality: N/A;   endaryerectomy     LEFT HEART CATH AND CORONARY ANGIOGRAPHY N/A 09/12/2021   Procedure: LEFT HEART CATH AND CORONARY ANGIOGRAPHY;  Surgeon: Wellington Hampshire, MD;  Location: [place] [facility];  Service: Cardiovascular;  Laterality: N/A;   renal stenting       Family History: Family History  Problem Relation Age of Onset   Cancer Mother    Coronary artery disease Father    Cerebrovascular Accident Father    Cancer Sister        breast cancer   Cancer Brother        stomach   Heart disease Daughter        heart vavle bypass?   Stroke Sister    Lung disease Brother    Cancer Brother        unknown   Arthritis Daughter      Social History: Social History   Socioeconomic History   Marital status: Widowed    Spouse name: Ihor Gully   Number of children: 3   Years of education: Not on file   Highest education level: 10th grade  Occupational History   Occupation: Retired  Tobacco Use   Smoking status: Former    Packs/day: 1.00    Years: 50.00    Pack years: 50.00    Types: Cigarettes    Quit date: 2017    Years since  quitting: 6.1    Passive exposure: Never   Smokeless tobacco: Never   Tobacco comments:    smoking cesssation materials not required  Vaping Use   Vaping Use: Never used  Substance and Sexual Activity   Alcohol use: No    Alcohol/week: 0.0 standard drinks   Drug use: No   Sexual activity: Not Currently    Birth control/protection: None  Other Topics Concern   Not on file  Social History Narrative   Pt lives alone   Social Determinants of Health   Financial Resource Strain: Low Risk    Difficulty of Paying Living Expenses: Not hard at all  Food Insecurity: No Food Insecurity   Worried About Charity fundraiser in the Last Year: Never true   [place] in the Last Year: Never true  Transportation Needs: No Transportation Needs   Lack of Transportation (Medical): No   Lack of Transportation (Non-Medical): No  Physical Activity: Inactive   Days of Exercise per Week: 0 days   Minutes of Exercise per Session: 0 min  Stress: No Stress Concern Present   Feeling of Stress : Only a little  Social Connections: Moderately Isolated   Frequency of Communication with Friends and Family: More than three times a week   Frequency of Social Gatherings with Friends and Family: More than three times a week   Attends Religious Services: More than 4 times per year   Active Member of [employer] or Organizations: No   Attends Archivist Meetings: Never   Marital Status: Widowed  Intimate Partner Violence: Not At Risk  Fear of Current or Ex-Partner: No   Emotionally Abused: No   Physically Abused: No   Sexually Abused: No     Review of Systems: Review of Systems  Constitutional:  Negative for chills, fever and malaise/fatigue.  HENT:  Negative for congestion, sore throat and tinnitus.   Eyes:  Negative for blurred vision and redness.  Respiratory:  Negative for cough, shortness of breath and wheezing.   Cardiovascular:  Negative for chest pain, palpitations, claudication and  leg swelling.  Gastrointestinal:  Positive for nausea and vomiting. Negative for abdominal pain, blood in stool and diarrhea.  Genitourinary:  Negative for flank pain, frequency and hematuria.  Musculoskeletal:  Negative for back pain, falls and myalgias.  Skin:  Negative for rash.  Neurological:  Positive for weakness. Negative for dizziness and headaches.  Endo/Heme/Allergies:  Does not bruise/bleed easily.  Psychiatric/Behavioral:  Negative for depression. The patient is not nervous/anxious and does not have insomnia.    Vital Signs: Blood pressure 94/62, pulse 87, temperature 97.9 F (36.6 C), resp. rate 18, height 5' (1.524 m), weight 34.5 kg, SpO2 96 %.  Weight trends: Filed Weights   12/29/21 1712  Weight: 34.5 kg    Physical Exam: General: NAD, frail, resting comfortably  Head: Posterior scalp bruise, dry oral mucosal membranes  Eyes: Anicteric  Lungs:  Clear to auscultation, normal effort, [place] O2  Heart: Regular rate and rhythm  Abdomen:  Soft, nontender, nondistended  Extremities: No peripheral edema.  Neurologic: Nonfocal, moving all four extremities  Skin: No lesions  Access: None     Lab results: Basic Metabolic Panel: Recent Labs  Lab 12/28/21 2025 12/29/21 0555 12/29/21 1706 12/30/21 0638  NA  --  142 139 142  K  --  2.6* 3.7 4.3  CL  --  103 103 108  CO2  --  19* 18* 17*  GLUCOSE  --  95 124* 107*  BUN  --  151* 97* 148*  CREATININE  --  5.55* 5.14* 5.14*  CALCIUM  --  8.1* 8.0* 8.3*  MG 1.4* 1.6*  --  2.1  PHOS  --   --   --  8.7*    Liver Function Tests: Recent Labs  Lab 12/28/21 1840 12/29/21 0555 12/30/21 0638  AST 187* 198* 161*  ALT 130* 126* 127*  ALKPHOS 57 53 53  BILITOT 1.2 0.6 0.5  PROT 7.3 6.7 6.7  ALBUMIN 2.7* 2.6* 2.5*   Recent Labs  Lab 12/29/21 0555  LIPASE 374*   No results for input(s): AMMONIA in the last 168 hours.  CBC: Recent Labs  Lab 12/28/21 1840 12/29/21 0555 12/30/21 0638  WBC 10.5 10.2 9.7   NEUTROABS  --  8.0*  --   HGB 9.9* 8.8* 8.4*  HCT 31.3* 27.6* 26.2*  MCV 99.1 98.6 96.7  PLT 204 180 207    Cardiac Enzymes: No results for input(s): CKTOTAL, CKMB, CKMBINDEX, TROPONINI in the last 168 hours.  BNP: Invalid input(s): POCBNP  CBG: No results for input(s): GLUCAP in the last 168 hours.  Microbiology: Results for orders placed or performed during the hospital encounter of 12/28/21  Resp Panel by RT-PCR (Flu A&B, Covid) Nasopharyngeal Swab     Status: Abnormal   Collection Time: 12/28/21  8:25 PM   Specimen: Nasopharyngeal Swab; Nasopharyngeal(NP) swabs in vial transport medium  Result Value Ref Range Status   SARS Coronavirus 2 by RT PCR POSITIVE (A) NEGATIVE Final    Comment: (NOTE) SARS-CoV-2 target nucleic acids are DETECTED.  The SARS-CoV-2 RNA is generally detectable in upper respiratory specimens during the acute phase of infection. Positive results are indicative of the presence of the identified virus, but do not rule out bacterial infection or co-infection with other pathogens not detected by the test. Clinical correlation with patient history and other diagnostic information is necessary to determine patient infection status. The expected result is Negative.  Fact Sheet for Patients: EntrepreneurPulse.com.au  Fact Sheet for Healthcare Providers: IncredibleEmployment.be  This test is not yet approved or cleared by the [place] FDA and  has been authorized for detection and/or diagnosis of SARS-CoV-2 by FDA under an Emergency Use Authorization (EUA).  This EUA will remain in effect (meaning this test can be used) for the duration of  the COVID-19 declaration under Section 564(b)(1) of the A ct, 21 U.S.C. section 360bbb-3(b)(1), unless the authorization is terminated or revoked sooner.     Influenza A by PCR NEGATIVE NEGATIVE Final   Influenza B by PCR NEGATIVE NEGATIVE Final    Comment: (NOTE) The Xpert  Xpress SARS-CoV-2/FLU/RSV plus assay is intended as an aid in the diagnosis of influenza from Nasopharyngeal swab specimens and should not be used as a sole basis for treatment. Nasal washings and aspirates are unacceptable for Xpert Xpress SARS-CoV-2/FLU/RSV testing.  Fact Sheet for Patients: EntrepreneurPulse.com.au  Fact Sheet for Healthcare Providers: IncredibleEmployment.be  This test is not yet approved or cleared by the [place] FDA and has been authorized for detection and/or diagnosis of SARS-CoV-2 by FDA under an Emergency Use Authorization (EUA). This EUA will remain in effect (meaning this test can be used) for the duration of the COVID-19 declaration under Section 564(b)(1) of the Act, 21 U.S.C. section 360bbb-3(b)(1), unless the authorization is terminated or revoked.  Performed at [facility], [address] [place], [place] [address]     Coagulation Studies: Recent Labs    12/29/21 0555  LABPROT 15.0  INR 1.2    Urinalysis: Recent Labs    12/29/21 1134  COLORURINE YELLOW*  LABSPEC 1.013  PHURINE 5.0  GLUCOSEU NEGATIVE  HGBUR LARGE*  BILIRUBINUR NEGATIVE  KETONESUR NEGATIVE  PROTEINUR 100*  NITRITE NEGATIVE  LEUKOCYTESUR NEGATIVE      Imaging: US RENAL  Result Date: 12/29/2021 CLINICAL DATA:  Acute kidney injury EXAM: RENAL / URINARY TRACT ULTRASOUND COMPLETE COMPARISON:  Sonogram dated September 11, 2021 FINDINGS: Right Kidney: Renal measurements: 9.8 x 5.4 x 4.9 = volume: 135 mL. Echogenicity within normal limits. No mass or hydronephrosis visualized. Simple cyst on the lateral aspect of the kidney measuring 1.4 x 1.3 x 1.5 cm and another upper pole cyst measuring 1.1 x 0.8 x 1.0 cm. Left Kidney: Renal measurements: 8.8 x 4.1 x 4.7 = volume: 89.8 mL. Echogenicity within normal limits. No mass or hydronephrosis visualized. Anechoic avascular cyst in the lower pole measuring 1.1 x 1.4 x 1.4 cm.  Another anechoic avascular cyst measuring 2.3 x 1.9 x 2.3 cm in the interpolar region. Bladder: Appears normal for degree of bladder distention. Other: None. IMPRESSION: 1.  No evidence of nephrolithiasis or hydronephrosis. 2.  Bilateral simple renal cysts, unchanged. Electronically Signed   By: Keane Police D.O.   On: 12/29/2021 10:08   DG Chest Port 1 View  Result Date: 12/28/2021 CLINICAL DATA:  Chest pain. EXAM: PORTABLE CHEST 1 VIEW COMPARISON:  Chest x-ray 10/24/2021 FINDINGS: The cardiac silhouette, mediastinal and hilar contours are within normal limits and stable. Chronic emphysematous changes and pulmonary scarring but no definite acute overlying pulmonary  process. The bony thorax is intact. IMPRESSION: Chronic lung changes but no acute pulmonary findings. Electronically Signed   By: Marijo Sanes M.D.   On: 12/28/2021 18:53   ECHOCARDIOGRAM COMPLETE  Result Date: 12/29/2021    ECHOCARDIOGRAM REPORT   Patient Name:   BRYNDA HEICK [facility] Date of Exam: 12/29/2021 Medical Rec #:  [id number]       Height:       60.0 in Accession #:    [id number]      Weight:       72.0 lb Date of Birth:  1928-12-02       BSA:          1.209 m Patient Age:    76 years        BP:           137/63 mmHg Patient Gender: F               HR:           71 bpm. Exam Location:  [facility] Procedure: 2D Echo, Cardiac Doppler and Color Doppler Indications:     I48.91 Atrial Fibrillation  History:         Patient has prior history of Echocardiogram examinations, most                  recent 08/19/2021. Risk Factors:Hypertension and Dyslipidemia.  Sonographer:     Cresenciano Lick RDCS Referring Phys:  [id number] Rhetta Mura Diagnosing Phys: Yolonda Kida MD IMPRESSIONS  1. Left ventricular ejection fraction, by estimation, is 55 to 60%. The left ventricle has normal function. The left ventricle has no regional wall motion abnormalities. Left ventricular diastolic parameters are consistent with Grade II diastolic dysfunction  (pseudonormalization).  2. Right ventricular systolic function is normal. The right ventricular size is normal.  3. Left atrial size was mildly dilated.  4. The mitral valve is abnormal. Mild mitral valve regurgitation. No evidence of mitral stenosis. Moderate mitral annular calcification.  5. The aortic valve is calcified. There is moderate calcification of the aortic valve. There is moderate thickening of the aortic valve. Aortic valve regurgitation is trivial. Aortic valve sclerosis/calcification is present, without any evidence of aortic stenosis. FINDINGS  Left Ventricle: Left ventricular ejection fraction, by estimation, is 55 to 60%. The left ventricle has normal function. The left ventricle has no regional wall motion abnormalities. The left ventricular internal cavity size was normal in size. There is  no left ventricular hypertrophy. Left ventricular diastolic parameters are consistent with Grade II diastolic dysfunction (pseudonormalization). Right Ventricle: The right ventricular size is normal. No increase in right ventricular wall thickness. Right ventricular systolic function is normal. Left Atrium: Left atrial size was mildly dilated. Right Atrium: Right atrial size was normal in size. Pericardium: There is no evidence of pericardial effusion. Mitral Valve: The mitral valve is abnormal. There is moderate thickening of the mitral valve leaflet(s). There is moderate calcification of the mitral valve leaflet(s). Normal mobility of the mitral valve leaflets. Moderate mitral annular calcification. Mild mitral valve regurgitation. No evidence of mitral valve stenosis. Tricuspid Valve: The tricuspid valve is normal in structure. Tricuspid valve regurgitation is mild. Aortic Valve: The aortic valve is calcified. There is moderate calcification of the aortic valve. There is moderate thickening of the aortic valve. There is moderate aortic valve annular calcification. Aortic valve regurgitation is trivial.  Aortic valve sclerosis/calcification is present, without any evidence of aortic stenosis. Aortic valve mean gradient measures 8.7 mmHg. Aortic valve  peak gradient measures 12.9 mmHg. Aortic valve area, by VTI measures 0.91 cm. Pulmonic Valve: The pulmonic valve was normal in structure. Pulmonic valve regurgitation is not visualized. Aorta: The ascending aorta was not well visualized. IAS/Shunts: No atrial level shunt detected by color flow Doppler.  LEFT VENTRICLE PLAX 2D LVIDd:         3.80 cm   Diastology LVIDs:         2.70 cm   LV e' medial:    3.42 cm/s LV PW:         1.20 cm   LV E/e' medial:  42.7 LV IVS:        1.40 cm   LV e' lateral:   6.57 cm/s LVOT diam:     1.80 cm   LV E/e' lateral: 22.2 LV SV:         40 LV SV Index:   33 LVOT Area:     2.54 cm  RIGHT VENTRICLE             IVC RV Basal diam:  3.50 cm     IVC diam: 1.50 cm RV S prime:     11.11 cm/s TAPSE (M-mode): 1.8 cm LEFT ATRIUM             Index        RIGHT ATRIUM           Index LA diam:        4.90 cm 4.05 cm/m   RA Area:     11.00 cm LA Vol (A2C):   68.6 ml 56.73 ml/m  RA Volume:   25.90 ml  21.42 ml/m LA Vol (A4C):   57.5 ml 47.55 ml/m LA Biplane Vol: 63.1 ml 52.19 ml/m  AORTIC VALVE AV Area (Vmax):    0.91 cm AV Area (Vmean):   0.82 cm AV Area (VTI):     0.91 cm AV Vmax:           179.67 cm/s AV Vmean:          141.667 cm/s AV VTI:            0.435 m AV Peak Grad:      12.9 mmHg AV Mean Grad:      8.7 mmHg LVOT Vmax:         64.35 cm/s LVOT Vmean:        45.750 cm/s LVOT VTI:          0.156 m LVOT/AV VTI ratio: 0.36  AORTA Ao Root diam: 2.90 cm MITRAL VALVE                TRICUSPID VALVE MV Area (PHT): 2.50 cm     TR Peak grad:   49.3 mmHg MV Decel Time: 303 msec     TR Vmax:        351.00 cm/s MV E velocity: 146.00 cm/s MV A velocity: 74.70 cm/s   SHUNTS MV E/A ratio:  1.95         Systemic VTI:  0.16 m                             Systemic Diam: 1.80 cm Yolonda Kida MD Electronically signed by Yolonda Kida MD  Signature Date/Time: 12/29/2021/4:57:14 PM    Final      Assessment & Plan: Samantha Dawson is a 88 y.o.  female with previous medical conditions including CAD, COPD, GERD,  hyperlipidemia, and chronic systolic and diastolic heart failure, who was admitted to [facility] on 12/28/2021 for.Rapid atrial fibrillation (H[facility]) [I48.91] AKI (acute kidney injury) ([place]) [N17.9]  Acute kidney injury with a baseline function within normal range in December 2022.  Acute kidney injury likely multifactorial due to poor oral intake and severe dehydration along with recent cardiac event.  No exposure to IV contrast.  Renal ultrasound negative for obstruction.  Very little urine output recorded over past 24 hours.  Discussed with patient and family that in this instance, we would usually consider temporary hemodialysis.  But with patient's current frailty, cardiac concerns, and possible lung cancer diagnosis, if this dialysis will require long-term, unsure what quality of life patient will have.  We provided patient and family with pertinent information needed for them to make an informed decision.  No acute need for dialysis at this time while they discuss and determine their goals of care.  We will continue to monitor.  Continue IV fluids and encourage oral intake.  Avoid any nephrotoxic agents or therapies.  Avoid hypotension.  2.  Atrial fibrillation with chronic diastolic heart failure.  Echo completed shows EF 55 to 60% with a grade 2 diastolic dysfunction.  Currently on heparin drip.  Cardiology following   LOS: 2 [place] 2/24/202310:05 AM

## 2021-12-30 NOTE — Consult Note (Addendum)
ANTICOAGULATION CONSULT NOTE  Pharmacy Consult for Heparin drip Indication:  NSTEMI  Allergies  Allergen Reactions   Alendronate Nausea Only    Patient Measurements: Height: 5' (152.4 cm) Weight: 34.5 kg (76 lb) IBW/kg (Calculated) : 45.5 Heparin Dosing Weight: 32.7 kg  Vital Signs: Temp: 97.9 F (36.6 C) (02/24 0805) BP: 94/62 (02/24 0805) Pulse Rate: 87 (02/24 0805)  Labs: Recent Labs    12/28/21 1840 12/28/21 2025 12/29/21 0555 12/29/21 1050 12/29/21 1706 12/29/21 2142 12/30/21 0638 12/30/21 0821  HGB 9.9*  --  8.8*  --   --   --  8.4*  --   HCT 31.3*  --  27.6*  --   --   --  26.2*  --   PLT 204  --  180  --   --   --  207  --   APTT  --   --   --   --   --  62*  --   --   LABPROT  --   --  15.0  --   --   --   --   --   INR  --   --  1.2  --   --   --   --   --   HEPARINUNFRC  --   --   --   --   --  0.26*  --  0.15*  CREATININE 5.63*  --  5.55*  --  5.14*  --  5.14*  --   TROPONINIHS 665*   < > 16,606* 11,360* 14,772*  --  8,917*  --    < > = values in this interval not displayed.     Estimated Creatinine Clearance: 4 mL/min (A) (by C-G formula based on SCr of 5.14 mg/dL (H)).   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   Encounter for chronic pain management    Hx of smoking 04/17/2016   Hyperlipidemia    Hypertension    Macular degeneration    Osteoarthritis    Osteoporosis 03/22/2016   Primary osteoarthritis of both hands 05/03/2016    Medications:  Scheduled:   amiodarone  200 mg Oral BID   clopidogrel  75 mg Oral Daily   metoprolol succinate  50 mg Oral Q breakfast   pantoprazole  20 mg Oral Q breakfast   rosuvastatin  20 mg Oral Daily   umeclidinium-vilanterol  1 puff  Inhalation Daily    Assessment: Patient admitted with chest pain. PMH includes NSTEMI 09/2021 s/p PCI with DES to proximal LAD and residual chronic occlusion of proximal/mid LCx, HFrEF (LVEF 35-40% w/ global hypo, G3 DD, mild MS, mild to mod aortic sclerosis/calc 08/2021), hyperlipidemia, COPD on chronic oxygen.  No anticoagulant PTA. Troponin peak at 16,606  Of note, during previous admission 09/11/21, was therapeutic at 600 units/hr.  Baseline labs: Hgb 8.8 Plt 180 INR 1.2  Date/Time HL Rate/comment 2/23 2100 0.26 400 units/hr; subtherapeutic 2/23 0821 0.15 450 units/hr; subtherapeutic    Goal of Therapy:  Heparin level 0.3-0.7 units/ml Monitor platelets by anticoagulation protocol: Yes   Plan: Heparin level subtherapeutic  Bolus heparin 1,000 units Increase heparin infusion rate to 550 units/hr 8-hour heparin level CBC, heparin level daily   Chrys Racer A  Ardine Eng, PharmD Pharmacy Resident  12/30/2021 8:59 AM

## 2021-12-30 NOTE — Progress Notes (Signed)
Initial Nutrition Assessment  DOCUMENTATION CODES:   Underweight  INTERVENTION:   -Ensure Enlive po TID, each supplement provides 350 kcal and 20 grams of protein.  -MVI with minerals daily  NUTRITION DIAGNOSIS:   Predicted suboptimal nutrient intake related to poor appetite as evidenced by estimated needs.  GOAL:   Patient will meet greater than or equal to 90% of their needs  MONITOR:   PO intake, Supplement acceptance, Labs, Weight trends, Skin, I & O's  REASON FOR ASSESSMENT:   Consult Assessment of nutrition requirement/status, Poor PO  ASSESSMENT:   Samantha Dawson is a 88 y.o. female with medical history significant for coronary artery disease status post stent placement in November 2022, hyperlipidemia, GERD, chronic systolic/diastolic heart failure, COPD, who is admitted to [facility] on 12/28/2021 with acute kidney injury after presenting from home to [facility] ED complaining of chest pain.  Pt admitted with AKI and chest pain.   2/24- s/p BSE- dysphagia 3 diet with thin liquids  Reviewed I/O's: +233 ml x 24 hours  UOP: 200 ml x 24 hours  Attempted to speak with pt x 3, however, pt in with MD at time of visits. On last attempt, pt speaking with palliative care team and family members in room very tearful.   Per chart review, pt endorses poor oral intake. No meal completion data currently available at this time.   Reviewed wt hx; pt has experienced a 25.5% wt loss over the past 3 months, which is significant for time frame.   High;y suspect pt with malnutrition, however, unable to identify at this time. Pt would greatly benefit from addition of oral nutrition supplements.   Medications reviewed and include sodium chloride and 0.9% sodium chloride infusion @ 50 ml/hr.   Labs reviewed: Phos: 8.7  Diet Order:   Diet Order             DIET DYS 3 Room service appropriate? Yes with Assist; Fluid consistency: Thin  Diet effective now                   EDUCATION  NEEDS:   Not appropriate for education at this time  Skin:  Skin Assessment: Reviewed RN Assessment  Last BM:  12/29/21  Height:   Ht Readings from Last 1 Encounters:  12/29/21 5' (1.524 m)    Weight:   Wt Readings from Last 1 Encounters:  12/29/21 34.5 kg    Ideal Body Weight:  45.5 kg  BMI:  Body mass index is 14.84 kg/m.  Estimated Nutritional Needs:   Kcal:  1200-1400  Protein:  55-70 grams  Fluid:  > 1.2 L    Loistine Chance, RD, LDN, [place] Registered Dietitian II Certified Diabetes Care and Education Specialist Please refer to [facility] for RD and/or RD on-call/weekend/after hours pager

## 2021-12-30 NOTE — Evaluation (Signed)
Occupational Therapy Evaluation Patient Details Name: Samantha Dawson MRN: [id number] DOB: 01/28/1929 Today's Date: 12/30/2021   History of Present Illness Samantha Dawson is a 88 y.o. female with medical history significant for coronary artery disease status post stent placement in November 2022, hyperlipidemia, GERD, chronic systolic/diastolic heart failure, COPD, who is admitted to [facility] on 12/28/2021 with acute kidney injury after presenting from home to [facility] ED complaining of chest pain. On arrival to ED she was also found to have new onset A-fib with RVR, spontaneous conversion to sinus rhythm while she was in ED.  Labs pertinent for rising troponin, which peaked at 16, 606.   Clinical Impression   Samantha Dawson was seen for OT evaluation this date. Prior to hospital admission, pt was MOD I for mobility and ADLs using RW. Pt lives alone with daughters available PRN, provide meals but both daughters work. Daughter at bedside reports pt does not want STR and currently looking in to options for caregivers. Pt presents to acute OT demonstrating impaired ADL performance and functional mobility 2/2 decreased activity tolerance and functional strength/ROM/balance deficits. Pt currently requires MIN A don underwear - assist for threading and standing, pt pulls up over rear in standing with good balance. MIN A sit<>stand at EOB. CGA + RW bed>chair. SpO2 90s sitting on 2L [place], desat 86% standing, resolved with 3L [place]. Pt would benefit from skilled OT to address noted impairments and functional limitations (see below for any additional details). Upon hospital discharge, recommend STR to maximize pt safety and return to PLOF. If pt chooses to return home would require 24/7 assist at d/c.      Recommendations for follow up therapy are one component of a multi-disciplinary discharge planning process, led by the attending physician.  Recommendations may be updated based on patient status, additional functional criteria  and insurance authorization.   Follow Up Recommendations  Skilled nursing-short term rehab (<3 hours/day)    Assistance Recommended at Discharge Frequent or constant Supervision/Assistance  Patient can return home with the following A little help with walking and/or transfers;A little help with bathing/dressing/bathroom;Help with stairs or ramp for entrance    Functional Status Assessment  Patient has had a recent decline in their functional status and demonstrates the ability to make significant improvements in function in a reasonable and predictable amount of time.  Equipment Recommendations  BSC/3in1    Recommendations for Other Services       Precautions / Restrictions Precautions Precautions: Fall Restrictions Weight Bearing Restrictions: No      Mobility Bed Mobility Overal bed mobility: Needs Assistance Bed Mobility: Supine to Sit     Supine to sit: Min guard          Transfers Overall transfer level: Needs assistance Equipment used: Rolling walker (2 wheels) Transfers: Sit to/from Stand Sit to Stand: Min assist                  Balance Overall balance assessment: Needs assistance Sitting-balance support: No upper extremity supported, Feet supported Sitting balance-Leahy Scale: Good     Standing balance support: During functional activity, No upper extremity supported Standing balance-Leahy Scale: Fair                             ADL either performed or assessed with clinical judgement   ADL Overall ADL's : Needs assistance/impaired  General ADL Comments: MIN A don underwear - assist for threading and standing, pt pulls up over rear in standing with good balance.      Pertinent Vitals/Pain Pain Assessment Pain Assessment: No/denies pain     Hand Dominance     Extremity/Trunk Assessment Upper Extremity Assessment Upper Extremity Assessment: Generalized weakness    Lower Extremity Assessment Lower Extremity Assessment: Generalized weakness       Communication Communication Communication: HOH   Cognition Arousal/Alertness: Awake/alert Behavior During Therapy: WFL for tasks assessed/performed Overall Cognitive Status: Within Functional Limits for tasks assessed                                       General Comments  SpO2 90s sitting on 2L [place], desat 86% standing, resolved with 3L [place].     Home Living Family/patient expects to be discharged to:: Private residence Living Arrangements: Alone Available Help at Discharge: Family;Available PRN/intermittently Type of Home: House Home Access: Stairs to enter     Home Layout: One level     Bathroom Shower/Tub: Tub/shower unit         Home Equipment: Conservation officer, nature (2 wheels)          Prior Functioning/Environment Prior Level of Function : Needs assist             Mobility Comments: Pt was mod I with ambulation in the home with RW ADLs Comments: Pt's daughters provided simple meals for the pt during the day while they are at work but come over to assist in the evenings & spend the night PRN.        OT Problem List: Decreased strength;Decreased range of motion;Decreased activity tolerance;Impaired balance (sitting and/or standing);Decreased safety awareness;Cardiopulmonary status limiting activity      OT Treatment/Interventions: Self-care/ADL training;Therapeutic exercise;Energy conservation;DME and/or AE instruction;Therapeutic activities;Patient/family education;Balance training    OT Goals(Current goals can be found in the care plan section) Acute Rehab OT Goals Patient Stated Goal: to go home OT Goal Formulation: With patient/family Time For Goal Achievement: 01/13/22 Potential to Achieve Goals: Good ADL Goals Pt Will Perform Grooming: with modified independence;standing Pt Will Perform Lower Body Dressing: with modified independence;sit to/from stand Pt  Will Transfer to Toilet: with modified independence;ambulating;regular height toilet  OT Frequency: Min 3X/week    Co-evaluation              AM-PAC OT "6 Clicks" Daily Activity     Outcome Measure Help from another person eating meals?: None Help from another person taking care of personal grooming?: A Little Help from another person toileting, which includes using toliet, bedpan, or urinal?: A Little Help from another person bathing (including washing, rinsing, drying)?: A Little Help from another person to put on and taking off regular upper body clothing?: None Help from another person to put on and taking off regular lower body clothing?: A Little 6 Click Score: 20   End of Session Equipment Utilized During Treatment: Rolling walker (2 wheels);Oxygen Nurse Communication: Mobility status  Activity Tolerance: Patient tolerated treatment well Patient left: in chair;with call bell/phone within reach;with chair alarm set;with family/visitor present  OT Visit Diagnosis: Other abnormalities of gait and mobility (R26.89);Muscle weakness (generalized) (M62.81)                Time: 6195-0932 OT Time Calculation (min): 25 min Charges:  OT General Charges $OT Visit: 1 Visit OT Evaluation $OT Eval  Moderate Complexity: 1 Mod OT Treatments $Self Care/Home Management : 8-22 mins  Dessie Coma, M.S. OTR/L  12/30/21, 3:09 PM  ascom [phone]

## 2021-12-30 NOTE — Assessment & Plan Note (Signed)
Resolved. -Monitor electrolyte and replete as needed

## 2021-12-30 NOTE — Progress Notes (Signed)
[facility] NOTE       Patient ID: Samantha Dawson MRN: [id number] DOB/AGE: 02-28-1929 88 y.o.  Admit date: 12/28/2021 Referring Physician Dr. Lorella Nimrod Primary Physician Dr. Steele Sizer Primary Cardiologist Dr. Humphrey Rolls Reason for Consultation chest pain, new onset AF  HPI: The patient is a 89yoF with PMH notable for CAD - NSTEMI 09/2021 s/p PCI with DES to proximal LAD and residual chronic occlusion of proximal/mid LCx, HF with improved EF (LVEF 50-55%, g2DD aortic sclerosis and valve area by VTI 0.91cm2 -- prev 35-40% w/ global hypo, G3 DD, mild MS, mild to mod aortic sclerosis/calc 08/2021), hyperlipidemia, COPD on chronic oxygen who presented to [facility] ED 2/22 with chest pain and was found to be in A-fib with RVR,  troponin peak of 16,000 and an acute kidney injury with current GFR of 7.  She spontaneously converted to normal rhythm and was started on p.o. amiodarone for maintenance, chest pain-free since conversion to NSR.  Cardiology is consulted for further assistance.  Interval History: -Denies any complaints, no chest pain, shortness of breath, dizziness. -remains in normal rhythm with rate of 80, no acute events on telemetry -Troponin peaked at [address]606 yesterday morning and has downtrended to 8900 today  Review of systems complete and found to be negative unless listed above     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   Encounter for chronic pain management    Hx of smoking 04/17/2016   Hyperlipidemia    Hypertension    Macular degeneration    Osteoarthritis    Osteoporosis 03/22/2016   Primary osteoarthritis of both hands 05/03/2016    Past Surgical History:  Procedure Laterality Date   ABDOMINAL  HYSTERECTOMY     CATARACT EXTRACTION     CORONARY STENT INTERVENTION N/A 09/12/2021   Procedure: CORONARY STENT INTERVENTION;  Surgeon: Wellington Hampshire, MD;  Location: [place];  Service: Cardiovascular;  Laterality: N/A;   endaryerectomy     LEFT HEART CATH AND CORONARY ANGIOGRAPHY N/A 09/12/2021   Procedure: LEFT HEART CATH AND CORONARY ANGIOGRAPHY;  Surgeon: Wellington Hampshire, MD;  Location: [place] [facility];  Service: Cardiovascular;  Laterality: N/A;   renal stenting      Medications Prior to Admission  Medication Sig Dispense Refill Last Dose   aspirin 81 MG tablet Take 81 mg by mouth daily.   12/28/2021   HYDROcodone-acetaminophen (NORCO/VICODIN) 5-325 MG tablet Take 1 tablet by mouth 3 (three) times daily as needed for moderate pain. Fill 02/04/2022 90 tablet 0 Past Week   isosorbide mononitrate (IMDUR) 30 MG 24 hr tablet Take 30 mg by mouth daily.   12/28/2021   metoprolol succinate (TOPROL-XL) 50 MG 24 hr tablet Take 50 mg by mouth daily.   12/28/2021   pantoprazole (PROTONIX) 20 MG tablet Take 1 tablet (20 mg total) by mouth daily. 180 tablet 0 12/28/2021   ticagrelor (BRILINTA) 90 MG TABS tablet Take 1 tablet (90 mg total) by mouth 2 (two) times daily. 180 tablet 3 12/28/2021   albuterol (PROAIR HFA) 108 (90 Base) MCG/ACT inhaler INHALE 2 PUFFS INTO THE LUNGS EVERY 4 HOURS AS NEEDED FOR WHEEZING OR SHORTNESS OF BREATH; FUTURE REFILLS FROM LUNG DOCTOR (Patient not taking: Reported on 12/29/2021)  8.5 g 3 Not Taking   furosemide (LASIX) 20 MG tablet Take 1 tablet (20 mg total) by mouth daily. Take when you gain 2 lbs in 24 hours 30 tablet 0 prn at unknown   HYDROcodone-acetaminophen (NORCO/VICODIN) 5-325 MG tablet Take 1 tablet by mouth 3 (three) times daily as needed for moderate pain. To last one month 90 tablet 0    HYDROcodone-acetaminophen (NORCO/VICODIN) 5-325 MG tablet Take 1 tablet by mouth 3 (three) times daily as needed for moderate pain. To last one month 90 tablet 0     metoprolol tartrate (LOPRESSOR) 50 MG tablet Take 50 mg by mouth 2 (two) times daily. (Patient not taking: Reported on 12/28/2021)   Not Taking   nitroGLYCERIN (NITROSTAT) 0.4 MG SL tablet Place 0.4 mg under the tongue every 5 (five) minutes as needed for chest pain.   prn at prn   potassium chloride SA (KLOR-CON M) 20 MEQ tablet Take 1 tablet (20 mEq total) by mouth daily. When you take furosemide 30 tablet 0 prn at unknown   rosuvastatin (CRESTOR) 20 MG tablet Take 20 mg by mouth daily.   12/27/2021   umeclidinium-vilanterol (ANORO ELLIPTA) 62.5-25 MCG/ACT AEPB Inhale 1 puff into the lungs daily at 6 (six) AM. (Patient not taking: Reported on 12/29/2021) 1 each 2 Not Taking    Social History   Socioeconomic History   Marital status: Widowed    Spouse name: Ihor Gully   Number of children: 3   Years of education: Not on file   Highest education level: 10th grade  Occupational History   Occupation: Retired  Tobacco Use   Smoking status: Former    Packs/day: 1.00    Years: 50.00    Pack years: 50.00    Types: Cigarettes    Quit date: 2017    Years since quitting: 6.1    Passive exposure: Never   Smokeless tobacco: Never   Tobacco comments:    smoking cesssation materials not required  Vaping Use   Vaping Use: Never used  Substance and Sexual Activity   Alcohol use: No    Alcohol/week: 0.0 standard drinks   Drug use: No   Sexual activity: Not Currently    Birth control/protection: None  Other Topics Concern   Not on file  Social History Narrative   Pt lives alone   Social Determinants of Health   Financial Resource Strain: Low Risk    Difficulty of Paying Living Expenses: Not hard at all  Food Insecurity: No Food Insecurity   Worried About Charity fundraiser in the Last Year: Never true   Solen in the Last Year: Never true  Transportation Needs: No Transportation Needs   Lack of Transportation (Medical): No   Lack of Transportation (Non-Medical): No  Physical  Activity: Inactive   Days of Exercise per Week: 0 days   Minutes of Exercise per Session: 0 min  Stress: No Stress Concern Present   Feeling of Stress : Only a little  Social Connections: Moderately Isolated   Frequency of Communication with Friends and Family: More than three times a week   Frequency of Social Gatherings with Friends and Family: More than three times a week   Attends Religious Services: More than 4 times per year   Active Member of [employer] or Organizations: No   Attends Archivist Meetings: Never   Marital Status: Widowed  Intimate Partner Violence: Not At Risk   Fear of Current or Ex-Partner: No  Emotionally Abused: No   Physically Abused: No   Sexually Abused: No    Family History  Problem Relation Age of Onset   Cancer Mother    Coronary artery disease Father    Cerebrovascular Accident Father    Cancer Sister        breast cancer   Cancer Brother        stomach   Heart disease Daughter        heart vavle bypass?   Stroke Sister    Lung disease Brother    Cancer Brother        unknown   Arthritis Daughter       Review of systems complete and found to be negative unless listed above    PHYSICAL EXAM General: Elderly and frail-appearing Caucasian female, in no acute distress.  Sitting upright in PCU bed with daughter and niece at bedside. HEENT:  Normocephalic and atraumatic.  Hard of hearing Neck:  No JVD.  Lungs: Normal respiratory effort on oxygen by nasal cannula. Clear bilaterally to auscultation. No wheezes, crackles, rhonchi.  Heart: HRRR . Normal S1 and S2 without gallops.  3/6 systolic murmur best heard at the left upper sternal border. Radial & DP pulses 2+ bilaterally. Abdomen: Non-distended appearing.  Msk: Normal strength and tone for age. Extremities: Warm and well perfused. No clubbing, cyanosis.  No peripheral edema.  Neuro: Alert and oriented X 3. Psych:  Answers questions appropriately.   Labs:   Lab Results   Component Value Date   WBC 9.7 12/30/2021   HGB 8.4 (L) 12/30/2021   HCT 26.2 (L) 12/30/2021   MCV 96.7 12/30/2021   PLT 207 12/30/2021    Recent Labs  Lab 12/30/21 0638  NA 142  K 4.3  CL 108  CO2 17*  BUN 148*  CREATININE 5.14*  CALCIUM 8.3*  PROT 6.7  BILITOT 0.5  ALKPHOS 53  ALT 127*  AST 161*  GLUCOSE 107*    Lab Results  Component Value Date   TROPONINI 0.34 (HH) 09/03/2016     Lab Results  Component Value Date   CHOL 113 08/19/2021   CHOL 140 11/30/2020   CHOL 143 11/25/2019   Lab Results  Component Value Date   HDL 60 08/19/2021   HDL 73 11/30/2020   HDL 69 11/25/2019   Lab Results  Component Value Date   LDLCALC 46 08/19/2021   LDLCALC 48 11/30/2020   LDLCALC CANCELED 11/30/2020   Lab Results  Component Value Date   TRIG 36 08/19/2021   TRIG 103 11/30/2020   TRIG 81 11/25/2019   Lab Results  Component Value Date   CHOLHDL 1.9 08/19/2021   CHOLHDL 1.9 11/30/2020   CHOLHDL 2.1 11/25/2019   No results found for: LDLDIRECT    Radiology: US RENAL  Result Date: 12/29/2021 CLINICAL DATA:  Acute kidney injury EXAM: RENAL / URINARY TRACT ULTRASOUND COMPLETE COMPARISON:  Sonogram dated September 11, 2021 FINDINGS: Right Kidney: Renal measurements: 9.8 x 5.4 x 4.9 = volume: 135 mL. Echogenicity within normal limits. No mass or hydronephrosis visualized. Simple cyst on the lateral aspect of the kidney measuring 1.4 x 1.3 x 1.5 cm and another upper pole cyst measuring 1.1 x 0.8 x 1.0 cm. Left Kidney: Renal measurements: 8.8 x 4.1 x 4.7 = volume: 89.8 mL. Echogenicity within normal limits. No mass or hydronephrosis visualized. Anechoic avascular cyst in the lower pole measuring 1.1 x 1.4 x 1.4 cm. Another anechoic avascular cyst measuring 2.3 x 1.9 x  2.3 cm in the interpolar region. Bladder: Appears normal for degree of bladder distention. Other: None. IMPRESSION: 1.  No evidence of nephrolithiasis or hydronephrosis. 2.  Bilateral simple renal cysts,  unchanged. Electronically Signed   By: Keane Police D.O.   On: 12/29/2021 10:08   DG Chest Port 1 View  Result Date: 12/28/2021 CLINICAL DATA:  Chest pain. EXAM: PORTABLE CHEST 1 VIEW COMPARISON:  Chest x-ray 10/24/2021 FINDINGS: The cardiac silhouette, mediastinal and hilar contours are within normal limits and stable. Chronic emphysematous changes and pulmonary scarring but no definite acute overlying pulmonary process. The bony thorax is intact. IMPRESSION: Chronic lung changes but no acute pulmonary findings. Electronically Signed   By: Marijo Sanes M.D.   On: 12/28/2021 18:53   ECHOCARDIOGRAM COMPLETE  Result Date: 12/29/2021    ECHOCARDIOGRAM REPORT   Patient Name:   TACHINA SPOONEMORE [facility] Date of Exam: 12/29/2021 Medical Rec #:  [id number]       Height:       60.0 in Accession #:    [id number]      Weight:       72.0 lb Date of Birth:  09-30-1929       BSA:          1.209 m Patient Age:    71 years        BP:           137/63 mmHg Patient Gender: F               HR:           71 bpm. Exam Location:  [facility] Procedure: 2D Echo, Cardiac Doppler and Color Doppler Indications:     I48.91 Atrial Fibrillation  History:         Patient has prior history of Echocardiogram examinations, most                  recent 08/19/2021. Risk Factors:Hypertension and Dyslipidemia.  Sonographer:     Cresenciano Lick RDCS Referring Phys:  [id number] Rhetta Mura Diagnosing Phys: Yolonda Kida MD IMPRESSIONS  1. Left ventricular ejection fraction, by estimation, is 55 to 60%. The left ventricle has normal function. The left ventricle has no regional wall motion abnormalities. Left ventricular diastolic parameters are consistent with Grade II diastolic dysfunction (pseudonormalization).  2. Right ventricular systolic function is normal. The right ventricular size is normal.  3. Left atrial size was mildly dilated.  4. The mitral valve is abnormal. Mild mitral valve regurgitation. No evidence of mitral stenosis. Moderate  mitral annular calcification.  5. The aortic valve is calcified. There is moderate calcification of the aortic valve. There is moderate thickening of the aortic valve. Aortic valve regurgitation is trivial. Aortic valve sclerosis/calcification is present, without any evidence of aortic stenosis. FINDINGS  Left Ventricle: Left ventricular ejection fraction, by estimation, is 55 to 60%. The left ventricle has normal function. The left ventricle has no regional wall motion abnormalities. The left ventricular internal cavity size was normal in size. There is  no left ventricular hypertrophy. Left ventricular diastolic parameters are consistent with Grade II diastolic dysfunction (pseudonormalization). Right Ventricle: The right ventricular size is normal. No increase in right ventricular wall thickness. Right ventricular systolic function is normal. Left Atrium: Left atrial size was mildly dilated. Right Atrium: Right atrial size was normal in size. Pericardium: There is no evidence of pericardial effusion. Mitral Valve: The mitral valve is abnormal. There is moderate thickening of the mitral valve leaflet(s). There  is moderate calcification of the mitral valve leaflet(s). Normal mobility of the mitral valve leaflets. Moderate mitral annular calcification. Mild mitral valve regurgitation. No evidence of mitral valve stenosis. Tricuspid Valve: The tricuspid valve is normal in structure. Tricuspid valve regurgitation is mild. Aortic Valve: The aortic valve is calcified. There is moderate calcification of the aortic valve. There is moderate thickening of the aortic valve. There is moderate aortic valve annular calcification. Aortic valve regurgitation is trivial. Aortic valve sclerosis/calcification is present, without any evidence of aortic stenosis. Aortic valve mean gradient measures 8.7 mmHg. Aortic valve peak gradient measures 12.9 mmHg. Aortic valve area, by VTI measures 0.91 cm. Pulmonic Valve: The pulmonic valve  was normal in structure. Pulmonic valve regurgitation is not visualized. Aorta: The ascending aorta was not well visualized. IAS/Shunts: No atrial level shunt detected by color flow Doppler.  LEFT VENTRICLE PLAX 2D LVIDd:         3.80 cm   Diastology LVIDs:         2.70 cm   LV e' medial:    3.42 cm/s LV PW:         1.20 cm   LV E/e' medial:  42.7 LV IVS:        1.40 cm   LV e' lateral:   6.57 cm/s LVOT diam:     1.80 cm   LV E/e' lateral: 22.2 LV SV:         40 LV SV Index:   33 LVOT Area:     2.54 cm  RIGHT VENTRICLE             IVC RV Basal diam:  3.50 cm     IVC diam: 1.50 cm RV S prime:     11.11 cm/s TAPSE (M-mode): 1.8 cm LEFT ATRIUM             Index        RIGHT ATRIUM           Index LA diam:        4.90 cm 4.05 cm/m   RA Area:     11.00 cm LA Vol (A2C):   68.6 ml 56.73 ml/m  RA Volume:   25.90 ml  21.42 ml/m LA Vol (A4C):   57.5 ml 47.55 ml/m LA Biplane Vol: 63.1 ml 52.19 ml/m  AORTIC VALVE AV Area (Vmax):    0.91 cm AV Area (Vmean):   0.82 cm AV Area (VTI):     0.91 cm AV Vmax:           179.67 cm/s AV Vmean:          141.667 cm/s AV VTI:            0.435 m AV Peak Grad:      12.9 mmHg AV Mean Grad:      8.7 mmHg LVOT Vmax:         64.35 cm/s LVOT Vmean:        45.750 cm/s LVOT VTI:          0.156 m LVOT/AV VTI ratio: 0.36  AORTA Ao Root diam: 2.90 cm MITRAL VALVE                TRICUSPID VALVE MV Area (PHT): 2.50 cm     TR Peak grad:   49.3 mmHg MV Decel Time: 303 msec     TR Vmax:        351.00 cm/s MV E velocity: 146.00 cm/s MV A velocity: 74.70 cm/s  SHUNTS MV E/A ratio:  1.95         Systemic VTI:  0.16 m                             Systemic Diam: 1.80 cm Dwayne Prince Rome MD Electronically signed by Yolonda Kida MD Signature Date/Time: 12/29/2021/4:57:14 PM    Final     ECHO LVEF 50-55%, G2 DD  LVEF 35-40% w/ global hypo, G3 DD, mild MS, mild to mod aortic sclerosis/calc 08/2021  TELEMETRY reviewed by me: Sinus rhythm rate 89  EKG reviewed by me: Initially A-fib with rapid  rate of 127, spontaneously converted to NSR with rate of 89 with persistent ST depressions in V5 V6  ASSESSMENT AND PLAN:  The patient is a 67yoF with PMH notable for CAD - NSTEMI 09/2021 s/p PCI with DES to proximal LAD and residual chronic occlusion of proximal/mid LCx, HF with improved EF (LVEF 50-55%, g2DD aortic sclerosis and valve area by VTI 0.91cm2 -- prev 35-40% w/ global hypo, G3 DD, mild MS, mild to mod aortic sclerosis/calc 08/2021), hyperlipidemia, COPD on chronic oxygen who presented to [facility] ED 2/22 with chest pain and was found to be in A-fib with RVR,  troponin peak of 16,000 and an acute kidney injury with current GFR of 7.  She spontaneously converted to normal rhythm and was started on p.o. amiodarone for maintenance, chest pain-free since conversion to NSR.  Cardiology is consulted for further assistance.  #New onset AF with RVR, spontaneously converted to NSR #atypical chest pain #Elevated troponin #COVID-19 The patient presented with sudden onset of 1 episode of nonexertional chest pain and worsening shortness of breath in the setting of feeling "weak" with poor oral intake x1 week.  She was found to be in atrial fibrillation by EMS and spontaneously converted to normal rhythm after receiving 1 dose of 2.5 mg metoprolol tartrate.  She is also incidentally COVID-positive on admission.  She is chest pain-free since her conversion to normal rhythm however, her troponin peaked at [address]606 this morning.  She has a significant kidney injury with current GFR of 7 and creatinine 5.55 (baseline 0.98 and GFR 55). Very reluctant to attempt any invasive procedures d/t her very poor renal function and overall debility.  -Remains in normal sinus rhythm during interview.  -continue amiodarone 200mg  BID x 7 days and transition to 200mg  once daily therafter for maintenance of NSR -S/p 324 mg aspirin.  S/p plavix 300mg  x1. -continue 75mg  plavix daily. D/c brillinta to decrease bleeding risk  -Heparin  gtt per pharmacy protocol with goals to transition to low dose eliquis for anticoagulation in the short term for new onset afib as her renal function hopefully improves.  -Continue metoprolol succinate 50 mg -Continue Crestor 20 therapy -CHADSVASC 6 -echocardiogram resulted with preserved EF 50-55%, G2 DD, aortic sclerosis and valve area by VTI 0.91 cm -Repeat EKG with recurrent chest pain -Agree with palliative care discussions, no further cardiac diagnostics at this time  #Acute kidney injury #electrolyte disturbances Renal function remains poor with Cr. 5.14 and GFR 8. (Baseline from 10/2021 Cr 1.0 and GFR 55. Agree with gentle fluid hydration per nephrology Agree with replacement of Mg and K+  #CAD - NSTEMI 09/2021 s/p PCI with DES to proximal LAD residual disease plan as above, medical management  #HFrEF (12/29/2021 LVEF 50-55%, G2 DD-previously 35-40%) #aortic sclerosis and valve area by VTI 0.91cm2 Currently appears euvolemic if not on the  dry side Intolerant of GDMT other than metoprolol d/t hypotention. Holding lasix 20mg .   This patient's plan of care was discussed and created with Dr. Lujean Amel and he is in agreement.  Signed: Tristan Schroeder , PA-C 12/30/2021, 9:44 AM [facility]

## 2021-12-30 NOTE — Consult Note (Signed)
Consultation Note Date: 12/30/2021   Patient Name: Samantha Dawson  DOB: 04-17-1929  MRN: [id number]  Age / Sex: 88 y.o., female  PCP: Steele Sizer, MD Referring Physician: Lorella Nimrod, MD  Reason for Consultation: Establishing goals of care  HPI/Patient Profile: 88 y.o. female  with past medical history of CAD s/p stent (November 2022), HLD, GERD, chronic systolic and diastolic HF, COPD, and history of COVID (December 2022) admitted on 12/28/2021 with this pain.  Patient had new onset of A-fib with RVR that spontaneously converted in the ED.  Patient is being treated for a NSTEMI and acute kidney injury..   Clinical Assessment and Goals of Care: I have reviewed medical records including EPIC notes, labs and imaging, assessed the patient and then met with patient, her daughter Golden [place], and her granddaughter at bedside to discuss diagnosis prognosis, GOC, EOL wishes, disposition and options.  I introduced Palliative Medicine as specialized medical care for people living with serious illness. It focuses on providing relief from the symptoms and stress of a serious illness. The goal is to improve quality of life for both the patient and the family.  We discussed a brief life review of the patient.  Patient worked in the [employer] until she was 89.  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  She has 3 daughters, 1 of whom is in a nursing home.  As far as functional and nutritional status patient was living at home independently and endorses being able to complete all of her ADLs by herself.  She reports that since November she has felt like she is gone "downhill".  She shares she does not like to be in the bed and prefers to be up and moving.  We discussed patient's current illness and what it means in the larger context of patient's on-going co-morbidities.  Natural  disease trajectory and expectations at EOL were discussed.  I attempted to elicit values and goals of care important to the patient.  Patient shares she wants to go home so that she can stand up and take care of herself like she did before.  However, patient is realistic and appreciates that she might not be able to do that any longer.  She speaks of not wanting to be a burden on her family since they "have their own families to look after".  The difference between aggressive medical intervention and comfort care was considered in light of the patient's goals of care.   Advance directives, concepts specific to code status, artificial feeding and hydration, and rehospitalization were considered and discussed.  It was clear that she would like to allow a natural death and never be resuscitated or placed on a ventilator.  She also endorsed that she would never want a feeding tube.  She is unsure if she would want to be given IV hydration should she not be able to keep up with her hydration needs by p.o. intake.  Education offered regarding concept specific to human mortality and  the limitations of medical interventions to prolong life when the body begins to fail to thrive.  Discussed patient is not a candidate for hemodialysis or an additional cardiac cath.  She is having multisystem organ failure.  Patient states she knows she had a heart attack and that her kidneys are not working.  However, she did not know that she did not currently have an active COVID infection.  She seemed very relieved by this.   Discussed with patient/family the importance of continued conversation with family and the medical providers regarding overall plan of care and treatment options, ensuring decisions are within the context of the patients values and GOCs.  Family asked that I return to the room at 315pm so that patient's other daughter can be present for goals of care discussion.  I returned to bedisde at 3:15pm to meet  with patient and her 2 daughters [place] and Jeannette.  Reviewed the patient's comorbidities and disposition options.  Hospice in the home and a hospice inpatient facility reviewed in detail.  Skilled nursing facility for rehab with or without hemodialysis discussed in detail.  Patient does not want to be evaluated for hospice at this time.  Patient would like to be considered for skilled nursing facility for rehab.  She and family are still contemplating whether or not she would want to proceed with hemodialysis.  Encourage continued conversations with patient and family regarding goals of care.  I am off service this weekend and will return on Monday.  I will continue to follow the patient throughout her hospitalization.  Primary Decision Maker PATIENT  Code Status/Advance Care Planning: DNR  Prognosis:   < 6 months  Discharge Planning: To Be Determined  Primary Diagnoses: Present on Admission:  AKI (acute kidney injury) ([place])  NSTEMI (non-ST elevated myocardial infarction) ([place])  Chest pain  Transaminitis  Hypokalemia  Acute anemia  Paroxysmal atrial fibrillation with RVR ([facility])  Acid reflux  Hyperlipidemia  Chronic combined systolic and diastolic heart failure ([place])  Essential hypertension   Physical Exam Vitals and nursing note reviewed.  Constitutional:      Comments: HOH  HENT:     Head: Normocephalic.  Eyes:     Pupils: Pupils are equal, round, and reactive to light.  Cardiovascular:     Rate and Rhythm: Normal rate.     Heart sounds: Normal heart sounds.  Pulmonary:     Comments: SOB with speaking more than 3-4 sentences Abdominal:     Palpations: Abdomen is soft.  Musculoskeletal:     Cervical back: Normal range of motion.     Comments: Generalized weakness  Skin:    General: Skin is warm.  Neurological:     Mental Status: She is alert and oriented to person, place, and time.  Psychiatric:        Mood and Affect: Mood normal. Mood is not anxious.         Behavior: Behavior normal. Behavior is not agitated.    Palliative Assessment/Data: 40%     I discussed this patient's plan of care with patient and her two daughters.  Thank you for this consult. Palliative medicine will continue to follow and assist holistically.   Time Total: 90 minutes Greater than 50%  of this time was spent counseling and coordinating care related to the above assessment and plan.  Signed by: Jordan Hawks, DNP, FNP-BC Palliative Medicine    Please contact Palliative Medicine Team phone at [phone] for questions and concerns.  For individual provider:  See Amion

## 2021-12-30 NOTE — Assessment & Plan Note (Signed)
EF of 35 to 40% with global hypokinesis according to prior echo.  EF improved to 50 to 55% and grade 2 diastolic dysfunction on repeat echo done on 12/29/2021. clinically appears euvolemic. -Holding diuretic due to AKI -Giving some gentle IV fluid -Monitor volume status closely -Daily BMP and weight -Strict intake and output

## 2021-12-30 NOTE — Assessment & Plan Note (Signed)
Hemoglobin at 8.8 without any obvious bleeding.  Baseline around 12 in December.  Patient was on aspirin and Brilinta at home.  Poor p.o. intake. Anemia panel with abnormally normal reticulocyte, more consistent with anemia of chronic disease. -Monitor hemoglobin -Transfuse if below 8

## 2021-12-30 NOTE — Progress Notes (Signed)
Progress Note   Patient: Samantha Dawson OVF:[id number] DOB: 10-22-1929 DOA: 12/28/2021     2 DOS: the patient was seen and examined on 12/30/2021   Brief hospital course: Taken from H&P.  KENIDI ELENBAAS is a 88 y.o. female with medical history significant for coronary artery disease status post stent placement in November 2022, hyperlipidemia, GERD, chronic systolic/diastolic heart failure, COPD, who is admitted to [facility] on 12/28/2021 with acute kidney injury after presenting from home to [facility] ED complaining of chest pain.  It was sudden onset sharp, substernal chest pain without any radiation, no exertional component, nonpleuritic.  Pain was associated with some palpitations, no shortness of breath, nausea or vomiting.  She received a full dose of aspirin at home earlier and pain improved in about an hour.  On arrival to ED she was also found to have new onset A-fib with RVR, spontaneous conversion to sinus rhythm while she was in ED.  Labs pertinent for rising troponin, which peaked at 16, 606, AKI with creatinine of 5.5, baseline around 1 in December 2022, lipase elevated at 374, potassium of 2.6, magnesium 1.6 and mild transaminitis.  COVID-19 was positive with CT value of 43.0 which consistent with prior infection.  Apparently she was positive at the end of December 2022.  No need for any treatment or isolation.  Prior echocardiogram with EF of 35 to 40% and global hypokinesis, grade 3 diastolic dysfunction,moderately dilated bilateral atria, trivial mitral regurgitation, trivial aortic regurgitation.  Cardiology was consulted and she was started on heparin infusion by cardiology.  Per daughter patient is experiencing declining in her health since her COVID-19 infection, tested positive on 10/25/2021.  Recently PCP advised to follow-up with palliative care.  Nephrology was consulted due to persistent AKI with GFR of 7, normal in December.  Renal ultrasound without any obstruction, did show  renal cyst.  No bladder outlet obstruction, remained oliguric.  Palliative care was also consulted, patient is not ready for hospice discussion.  They might consider short-term dialysis.  No final decision yet. PT is recommending SNF.   Assessment and Plan: * NSTEMI (non-ST elevated myocardial infarction) ([place])- (present on admission) Remained chest pain-free. Patient admitted with chest pain, upward trending troponin which peaked at [address] and now started trending down.  Chest pain has been resolved.  Numbers consistent with NSTEMI.  Recent history of NSTEMI s/p PCI in November 2022, compliant with aspirin and Brilinta.   Cardiology has been consulted. Her cardiologist Dr. Manuella Ghazi is out of country-Dr. Clayborn Bigness will be covering. Repeat echocardiogram with low normal EF, grade 2 diastolic dysfunction. -Continue heparin infusion -Brilinta was switched with Plavix by cardiology to decrease the risk of bleeding -Continue to trend troponin -Will not be a candidate for cardiac catheter at this time due to AKI -Continue with metoprolol -Continue with Crestor -No further cardiac work-up planned. -Palliative care consult  AKI (acute kidney injury) ([place])- (present on admission) Creatinine at 5.55>>5.14 this morning, baseline around 1 in December 2022.  No contrast exposure. Renal ultrasound with no hydronephrosis or nephrolithiasis.  Bilateral stable cyst.  No bladder outlet obstruction on bladder scan.  Remained oliguric -Nephrology was consulted-patient might consider short-term dialysis. -Concern of cardiorenal due to NSTEMI. -Poor p.o. intake can be contributory -Continue gentle fluid -Monitor renal function  Chronic combined systolic and diastolic heart failure ([place])- (present on admission) EF of 35 to 40% with global hypokinesis according to prior echo.  EF improved to 50 to 55% and grade 2 diastolic dysfunction on  repeat echo done on 12/29/2021. clinically appears euvolemic. -Holding  diuretic due to AKI -Giving some gentle IV fluid -Monitor volume status closely -Daily BMP and weight -Strict intake and output  Paroxysmal atrial fibrillation with RVR ([place])- (present on admission) New onset,CHA2DS2-VASc score of 6. Spontaneous conversion to sinus rhythm. Cardiology started her on amiodarone. Currently on heparin infusion. -Continue to monitor -Continue with metoprolol  Hypokalemia- (present on admission) Resolved. -Monitor electrolyte and replete as needed  Transaminitis- (present on admission) Can be due to cardiohepatic reason. -Hepatitis viral panel-pending -Acetaminophen level-pending  Essential hypertension- (present on admission) Blood pressure within goal.  On Lasix and metoprolol at home. -Holding Lasix for AKI -Continue with metoprolol  Acute anemia- (present on admission) Hemoglobin at 8.8 without any obvious bleeding.  Baseline around 12 in December.  Patient was on aspirin and Brilinta at home.  Poor p.o. intake. Anemia panel with abnormally normal reticulocyte, more consistent with anemia of chronic disease. -Monitor hemoglobin -Transfuse if below 8  Acid reflux- (present on admission) - Continue with Protonix  Hyperlipidemia- (present on admission) - Continue with Crestor    Subjective: Patient was seen and examined today.  Denies any chest pain.  Daughter at bedside.  Physical Exam: Vitals:   12/30/21 0423 12/30/21 0805 12/30/21 1210 12/30/21 1618  BP: 103/70 94/62 95/61  (!) 137/56  Pulse: 87 87 84 75  Resp: 16 18 18 17   Temp: 97.9 F (36.6 C) 97.9 F (36.6 C) 97.8 F (36.6 C) 97.9 F (36.6 C)  TempSrc:      SpO2: 97% 96% 98% 94%  Weight:      Height:       General.  Frail and malnourished elderly lady, in no acute distress.  Very hard of hearing Pulmonary.  Lungs clear bilaterally, normal respiratory effort. CV.  Regular rate and rhythm, no JVD, rub or murmur. Abdomen.  Soft, nontender, nondistended, BS positive. CNS.   Alert and oriented .  No focal neurologic deficit. Extremities.  No edema, no cyanosis, pulses intact and symmetrical. Psychiatry.  Judgment and insight appears normal.  Data Reviewed: Reviewed prior notes and labs.  Family Communication: Discussed with daughters  Disposition: Status is: Inpatient Remains inpatient appropriate because: Severity of illness   Planned Discharge Destination: Skilled nursing facility  DVT prophylaxis.  Heparin infusion  Time spent: 50 minutes  This record has been created using Systems analyst. Errors have been sought and corrected,but may not always be located. Such creation errors do not reflect on the standard of care.  Author: Lorella Nimrod, MD 12/30/2021 4:47 PM  For on call review [email or web address].

## 2021-12-30 NOTE — Evaluation (Signed)
Physical Therapy Evaluation Patient Details Name: Samantha Dawson MRN: [id number] DOB: 1928/11/18 Today's Date: 12/30/2021  History of Present Illness  Pt is a 88 y.o. female with medical history significant for coronary artery disease status post stent placement in November 2022, hyperlipidemia, GERD, chronic systolic/diastolic heart failure, COPD, who is admitted to [facility] on 12/28/2021 with acute kidney injury after presenting from home to [facility] ED complaining of chest pain. On arrival to ED she was also found to have new onset A-fib with RVR, spontaneous conversion to sinus rhythm while she was in ED.  Labs pertinent for rising troponin, which peaked at [place]. MD assessment includes: New onset AF with RVR, hypokalemia, anemia, atypical chest pain, elevated troponin, AKI, electrolyte disturbances, and NSTEMI.   Clinical Impression  Pt pleasant and put forth good effort during the session but fatigued quickly and was also limited by pain in her bottom, nursing aware.  Pt required physical assistance with transfers and bed mobility and was only able to amb a max of 3 feet before needing to return to sitting secondary to pain in her bottom and general fatigue/SOB.  Pt's SpO2 found to be 85% on 2.5L after ambulating with nursing in room; O2 increased to 3L with SpO2 quickly increasing back to 89%.  Pt left in slight R sidelying with reported relief of pain in her bottom.  Pt will benefit from PT services in a SNF setting upon discharge to safely address deficits listed in patient problem list for decreased caregiver assistance and eventual return to PLOF.        Recommendations for follow up therapy are one component of a multi-disciplinary discharge planning process, led by the attending physician.  Recommendations may be updated based on patient status, additional functional criteria and insurance authorization.  Follow Up Recommendations Skilled nursing-short term rehab (<3 hours/day)    Assistance  Recommended at Discharge Frequent or constant Supervision/Assistance  Patient can return home with the following  A lot of help with walking and/or transfers;A lot of help with bathing/dressing/bathroom;Help with stairs or ramp for entrance;Direct supervision/assist for medications management;Assist for transportation;Assistance with cooking/housework;Direct supervision/assist for financial management    Equipment Recommendations None recommended by PT  Recommendations for Other Services       Functional Status Assessment Patient has had a recent decline in their functional status and demonstrates the ability to make significant improvements in function in a reasonable and predictable amount of time.     Precautions / Restrictions Precautions Precautions: Fall Restrictions Weight Bearing Restrictions: No      Mobility  Bed Mobility Overal bed mobility: Needs Assistance Bed Mobility: Sit to Supine       Sit to supine: Max assist   General bed mobility comments: Max A for BLE and trunk control    Transfers Overall transfer level: Needs assistance Equipment used: Rolling walker (2 wheels) Transfers: Sit to/from Stand Sit to Stand: Min assist           General transfer comment: mod verbal cues for hand placement with min A to come to standing as well as to control sitting    Ambulation/Gait Ambulation/Gait assistance: Min guard Gait Distance (Feet): 3 Feet Assistive device: Rolling walker (2 wheels) Gait Pattern/deviations: Step-through pattern, Decreased step length - right, Decreased step length - left, Trunk flexed Gait velocity: decreased     General Gait Details: Very slow, effortful cadence but no physical assist needed; pt's self-selected max distance was 3 feet with mod SOB and SpO2 85% on  2.5L, nsg in room and aware  Stairs            Wheelchair Mobility    Modified Rankin (Stroke Patients Only)       Balance Overall balance assessment: Needs  assistance Sitting-balance support: No upper extremity supported, Feet supported Sitting balance-Leahy Scale: Good     Standing balance support: Bilateral upper extremity supported, During functional activity Standing balance-Leahy Scale: Fair                               Pertinent Vitals/Pain Pain Assessment Pain Assessment: PAINAD Breathing: occasional labored breathing, short period of hyperventilation Negative Vocalization: occasional moan/groan, low speech, negative/disapproving quality Facial Expression: facial grimacing Body Language: relaxed Consolability: no need to console PAINAD Score: 4 Pain Intervention(s): Repositioned, Premedicated before session, Monitored during session    [place] expects to be discharged to:: Private residence Living Arrangements: Alone Available Help at Discharge: Family;Available PRN/intermittently Type of Home: House Home Access: Stairs to enter Entrance Stairs-Rails: Right;Left;Can reach both Entrance Stairs-Number of Steps: 1   Home Layout: One level Home Equipment: Conservation officer, nature (2 wheels);Rollator (4 wheels)      Prior Function Prior Level of Function : Needs assist             Mobility Comments: Pt was mod I with ambulation in the home with RW, one fall in the last 6 months that occured recently when she slipped off seat of rollator while trying to sit down on it ADLs Comments: Pt's daughters provided simple meals for the pt during the day while they are at work but come over to assist in the evenings & spend the night PRN.     Hand Dominance        Extremity/Trunk Assessment   Upper Extremity Assessment Upper Extremity Assessment: Generalized weakness    Lower Extremity Assessment Lower Extremity Assessment: Generalized weakness       Communication   Communication: HOH  Cognition Arousal/Alertness: Awake/alert Behavior During Therapy: WFL for tasks assessed/performed Overall  Cognitive Status: Within Functional Limits for tasks assessed                                          General Comments General comments (skin integrity, edema, etc.): SpO2 90s sitting on 2L [place], desat 86% standing, resolved with 3L [place].    Exercises Other Exercises Other Exercises: Static standing with UE support and static sitting unsupported for improved activity tolerance Other Exercises: Multiple sit to/from stands from various height surfaces with cues for sequencing PLB education    Assessment/Plan    PT Assessment Patient needs continued PT services  PT Problem List Decreased strength;Decreased activity tolerance;Decreased balance;Decreased mobility;Decreased knowledge of use of DME;Pain       PT Treatment Interventions DME instruction;Gait training;Stair training;Functional mobility training;Therapeutic activities;Therapeutic exercise;Patient/family education    PT Goals (Current goals can be found in the Care Plan section)  Acute Rehab PT Goals Patient Stated Goal: To walk better without pain PT Goal Formulation: With patient Time For Goal Achievement: 01/12/22 Potential to Achieve Goals: Fair    Frequency Min 2X/week     Co-evaluation               AM-PAC PT "6 Clicks" Mobility  Outcome Measure Help needed turning from your back to your side while in a  flat bed without using bedrails?: A Lot Help needed moving from lying on your back to sitting on the side of a flat bed without using bedrails?: A Lot Help needed moving to and from a bed to a chair (including a wheelchair)?: A Lot Help needed standing up from a chair using your arms (e.g., wheelchair or bedside chair)?: A Lot Help needed to walk in hospital room?: Total Help needed climbing 3-5 steps with a railing? : Total 6 Click Score: 10    End of Session Equipment Utilized During Treatment: Gait belt;Oxygen Activity Tolerance: Patient limited by fatigue;Patient limited by  pain Patient left: in bed;with call bell/phone within reach;with nursing/sitter in room;with family/visitor present;with bed alarm set;Other (comment) (Pt turned slightly to R side to relieve bottom pain, nsg aware and assisted with positioning) Nurse Communication: Mobility status PT Visit Diagnosis: Unsteadiness on feet (R26.81);History of falling (Z91.81);Difficulty in walking, not elsewhere classified (R26.2);Pain Pain - part of body:  (sacral area from recent fall)    Time: 4383-8184 PT Time Calculation (min) (ACUTE ONLY): 29 min   Charges:   PT Evaluation $PT Eval Moderate Complexity: 1 Mod PT Treatments $Therapeutic Activity: 8-22 mins       D. Royetta Asal PT, DPT 12/30/21, 3:47 PM

## 2021-12-30 NOTE — Consult Note (Signed)
ANTICOAGULATION CONSULT NOTE  Pharmacy Consult for Heparin drip Indication:  NSTEMI  Allergies  Allergen Reactions   Alendronate Nausea Only    Patient Measurements: Height: 5' (152.4 cm) Weight: 34.5 kg (76 lb) IBW/kg (Calculated) : 45.5 Heparin Dosing Weight: 32.7 kg  Vital Signs: Temp: 97.9 F (36.6 C) (02/24 1618) BP: 137/56 (02/24 1618) Pulse Rate: 75 (02/24 1618)  Labs: Recent Labs    12/28/21 1840 12/28/21 2025 12/29/21 0555 12/29/21 1050 12/29/21 1706 12/29/21 2142 12/30/21 0638 12/30/21 0821 12/30/21 1802  HGB 9.9*  --  8.8*  --   --   --  8.4*  --   --   HCT 31.3*  --  27.6*  --   --   --  26.2*  --   --   PLT 204  --  180  --   --   --  207  --   --   APTT  --   --   --   --   --  62*  --   --   --   LABPROT  --   --  15.0  --   --   --   --   --   --   INR  --   --  1.2  --   --   --   --   --   --   HEPARINUNFRC  --   --   --   --   --  0.26*  --  0.15* 0.24*  CREATININE 5.63*  --  5.55*  --  5.14*  --  5.14*  --   --   TROPONINIHS 665*   < > 16,606* 11,360* 14,772*  --  8,917*  --   --    < > = values in this interval not displayed.     Estimated Creatinine Clearance: 4 mL/min (A) (by C-G formula based on SCr of 5.14 mg/dL (H)).   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   Encounter for chronic pain management    Hx of smoking 04/17/2016   Hyperlipidemia    Hypertension    Macular degeneration    Osteoarthritis    Osteoporosis 03/22/2016   Primary osteoarthritis of both hands 05/03/2016    Medications:  Scheduled:   amiodarone  200 mg Oral BID   clopidogrel  75 mg Oral Daily   feeding supplement  237 mL Oral TID BM   metoprolol succinate  50 mg Oral Q breakfast    multivitamin with minerals  1 tablet Oral Daily   pantoprazole  20 mg Oral Q breakfast   rosuvastatin  20 mg Oral Daily   umeclidinium-vilanterol  1 puff Inhalation Daily    Assessment: Patient admitted with chest pain. PMH includes NSTEMI 09/2021 s/p PCI with DES to proximal LAD and residual chronic occlusion of proximal/mid LCx, HFrEF (LVEF 35-40% w/ global hypo, G3 DD, mild MS, mild to mod aortic sclerosis/calc 08/2021), hyperlipidemia, COPD on chronic oxygen.  No anticoagulant PTA. Troponin peak at 16,606  Of note, during previous admission 09/11/21, was therapeutic at 600 units/hr.  Baseline labs: Hgb 8.8 Plt 180 INR 1.2  Date/Time HL Rate/comment 2/23 2100 0.26 400 units/hr; subtherapeutic 2/23 0821 0.15 450 units/hr;  subtherapeutic, inc to 550 u/hr 2/24 1802 0.24 Inc to 650 u/hr, subthera    Goal of Therapy:  Heparin level 0.3-0.7 units/ml Monitor platelets by anticoagulation protocol: Yes   Plan: Heparin level subtherapeutic = 0.24 Bolus heparin 1,000 units Increase heparin infusion rate to 650 units/hr 8-hour heparin level CBC, heparin level daily   Zipporah Finamore A, PharmD 12/30/2021 7:29 PM

## 2021-12-30 NOTE — Assessment & Plan Note (Signed)
Remained chest pain-free. Patient admitted with chest pain, upward trending troponin which peaked at 16606 and now started trending down.  Chest pain has been resolved.  Numbers consistent with NSTEMI.  Recent history of NSTEMI s/p PCI in November 2022, compliant with aspirin and Brilinta.   Cardiology has been consulted. Her cardiologist Dr. Manuella Ghazi is out of [place]-Dr. Clayborn Bigness will be covering. Repeat echocardiogram with low normal EF, grade 2 diastolic dysfunction. -Continue heparin infusion -Brilinta was switched with Plavix by cardiology to decrease the risk of bleeding -Continue to trend troponin -Will not be a candidate for cardiac catheter at this time due to AKI -Continue with metoprolol -Continue with Crestor -No further cardiac work-up planned. -Palliative care consult

## 2021-12-30 NOTE — TOC Initial Note (Signed)
Transition of Care [facility]) - Initial/Assessment Note    Patient Details  Name: Samantha Dawson MRN: [id number] Date of Birth: 02/28/1929  Transition of [facility]) CM/SW Contact:    Candie Chroman, LCSW Phone Number: 12/30/2021, 1:49 PM  Clinical Narrative:  Readmission prevention screen complete. CSW met with patient. Daughter at bedside. CSW introduced role and explained that discharge planning would be discussed. PCP is Steele Sizer, MD at [facility]. Daughters drive her to appointments. Pharmacy is [facility] on the corner of [address] and [address]. No issues obtaining medications. No home health prior to admission. She has a rollator, shower chair, and elevated toilet seat at home. She is on 2 L chronic oxygen through [place]. No further concerns. CSW encouraged patient and her daughter to contact CSW as needed. CSW will continue to follow patient for support and facilitate return home when stable.               Expected Discharge Plan: Home/Self Care Barriers to Discharge: Continued Medical Work up   Patient Goals and CMS Choice        Expected Discharge Plan and Services Expected Discharge Plan: Home/Self Care     Post Acute Care Choice: NA Living arrangements for the past 2 months: Single Family Home                                      Prior Living Arrangements/Services Living arrangements for the past 2 months: Single Family Home   Patient language and need for interpreter reviewed:: Yes Do you feel safe going back to the place where you live?: Yes      Need for Family Participation in Patient Care: Yes (Comment) Care giver support system in place?: Yes (comment) Current home services: DME Criminal Activity/Legal Involvement Pertinent to Current Situation/Hospitalization: No - Comment as needed  Activities of Daily Living Home Assistive Devices/Equipment: Oxygen, Walker (specify type), Wheelchair, Shower chair with back, Raised  toilet seat with rails ADL Screening (condition at time of admission) Patient's cognitive ability adequate to safely complete daily activities?: Yes Is the patient deaf or have difficulty hearing?: Yes Does the patient have difficulty seeing, even when wearing glasses/contacts?: Yes Does the patient have difficulty concentrating, remembering, or making decisions?: Yes Patient able to express need for assistance with ADLs?: Yes Does the patient have difficulty dressing or bathing?: Yes Independently performs ADLs?: No Communication: Independent Dressing (OT): Needs assistance Is this a change from baseline?: Change from baseline, expected to last >3 days Grooming: Needs assistance Is this a change from baseline?: Change from baseline, expected to last >3 days Feeding: Needs assistance Is this a change from baseline?: Change from baseline, expected to last <3 days Bathing: Needs assistance Is this a change from baseline?: Change from baseline, expected to last >3 days Toileting: Needs assistance Is this a change from baseline?: Change from baseline, expected to last >3days In/Out Bed: Needs assistance Is this a change from baseline?: Change from baseline, expected to last >3 days Walks in Home: Needs assistance Is this a change from baseline?: Change from baseline, expected to last >3 days Does the patient have difficulty walking or climbing stairs?: Yes Weakness of Legs: Both Weakness of Arms/Hands: None  Permission Sought/Granted Permission sought to share information with : Facility Art therapist granted to share information with : Yes, Verbal Permission Granted  Permission granted to share info w Relationship: Daughter at bedside     Emotional Assessment Appearance:: Appears stated age Attitude/Demeanor/Rapport: Engaged, Gracious Affect (typically observed): Accepting, Appropriate, Calm, Pleasant Orientation: : Oriented to Self, Oriented to Place,  Oriented to  Time, Oriented to Situation Alcohol / Substance Use: Not Applicable Psych Involvement: No (comment)  Admission diagnosis:  Rapid atrial fibrillation ([place]) [I48.91] AKI (acute kidney injury) ([place]) [N17.9] Patient Active Problem List   Diagnosis Date Noted   AKI (acute kidney injury) ([place]) 12/28/2021   Chest pain 12/28/2021   Transaminitis 12/28/2021   Hypokalemia 12/28/2021   Acute anemia 12/28/2021   Paroxysmal atrial fibrillation with RVR ([place]) 12/28/2021   Chronic combined systolic and diastolic heart failure ([place]) 10/06/2021   Chronic respiratory failure with hypoxia ([place]) 10/05/2021   Lung mass    Elevated troponin 09/10/2021   CHF (congestive heart failure), NYHA class IV, acute, diastolic ([place]) 16/57/9038   Thrombocytopenia ([place]) 08/29/2021   Hypertensive emergency 08/23/2021   NSTEMI (non-ST elevated myocardial infarction) ([place]) 08/18/2021   History of nonmelanoma skin cancer 06/06/2020   Senile purpura ([place]) 05/04/2020   Nocturnal hypoxemia due to emphysema ([place]) 05/04/2020   Carotid atherosclerosis, bilateral 08/27/2018   Bilateral carotid bruits 08/27/2018   CKD (chronic kidney disease) stage 3, GFR 30-59 ml/min (HCC) 08/27/2018   Age-related osteoporosis without current pathological fracture 08/11/2018   Shoulder pain, left 08/29/2017   Acid reflux 33/38/3291   Uncomplicated opioid use 91/66/0600   Hyperglycemia 09/02/2016   Left thyroid nodule 07/13/2016   Abnormal bone scan of cervical spine 06/18/2016   Primary osteoarthritis of both hands 05/03/2016   Elevated serum alkaline phosphatase level 05/03/2016   Degenerative disc disease, thoracic 05/03/2016   Hx of smoking 04/17/2016   Controlled substance agreement signed 03/22/2016   Back pain 03/22/2016   Hyperlipidemia 10/25/2015   Essential hypertension 10/25/2015   Panlobular emphysema ([place]) 10/25/2015   Chronic pain 05/25/2015   Actinic keratosis 05/25/2015   PCP:  Steele Sizer,  MD Pharmacy:   [id number] [address], [address] [place] [address] Phone: [phone] Fax: [phone]     Social Determinants of Health (SDOH) Interventions    Readmission Risk Interventions Readmission Risk Prevention Plan 12/30/2021  Transportation Screening Complete  Medication Review (RN Care Manager) Complete  PCP or Specialist appointment within 3-5 days of discharge Complete  SW Recovery Care/Counseling Consult Complete  Palliative Care Screening Complete  [place] Not Applicable  Some recent data might be hidden

## 2021-12-30 NOTE — Care Management Important Message (Signed)
Important Message  Patient Details  Name: ALAYNA MABE MRN: [id number] Date of Birth: 11/30/1928   Medicare Important Message Given:  N/A - LOS <3 / Initial given by admissions     Juliann Pulse A Shiquita Collignon 12/30/2021, 9:59 AM

## 2021-12-30 NOTE — Assessment & Plan Note (Signed)
Creatinine at 5.55>>5.14 this morning, baseline around 1 in December 2022.  No contrast exposure. Renal ultrasound with no hydronephrosis or nephrolithiasis.  Bilateral stable cyst.  No bladder outlet obstruction on bladder scan.  Remained oliguric -Nephrology was consulted-patient might consider short-term dialysis. -Concern of cardiorenal due to NSTEMI. -Poor p.o. intake can be contributory -Continue gentle fluid -Monitor renal function

## 2021-12-31 ENCOUNTER — Inpatient Hospital Stay: Payer: Medicare Other

## 2021-12-31 DIAGNOSIS — I214 Non-ST elevation (NSTEMI) myocardial infarction: Secondary | ICD-10-CM | POA: Diagnosis not present

## 2021-12-31 DIAGNOSIS — E8729 Other acidosis: Secondary | ICD-10-CM | POA: Diagnosis not present

## 2021-12-31 LAB — RENAL FUNCTION PANEL
Albumin: 2.4 g/dL — ABNORMAL LOW (ref 3.5–5.0)
Anion gap: 16 — ABNORMAL HIGH (ref 5–15)
BUN: 187 mg/dL — ABNORMAL HIGH (ref 8–23)
CO2: 15 mmol/L — ABNORMAL LOW (ref 22–32)
Calcium: 8.1 mg/dL — ABNORMAL LOW (ref 8.9–10.3)
Chloride: 108 mmol/L (ref 98–111)
Creatinine, Ser: 4.84 mg/dL — ABNORMAL HIGH (ref 0.44–1.00)
GFR, Estimated: 8 mL/min — ABNORMAL LOW (ref >60)
Glucose, Bld: 111 mg/dL — ABNORMAL HIGH (ref 70–99)
Phosphorus: 8.8 mg/dL — ABNORMAL HIGH (ref 2.5–4.6)
Potassium: 3.9 mmol/L (ref 3.5–5.1)
Sodium: 139 mmol/L (ref 135–145)

## 2021-12-31 LAB — FERRITIN: Ferritin: 381 ng/mL — ABNORMAL HIGH (ref 11–307)

## 2021-12-31 LAB — CBC
HCT: 24.8 % — ABNORMAL LOW (ref 36.0–46.0)
Hemoglobin: 7.9 g/dL — ABNORMAL LOW (ref 12.0–15.0)
MCH: 31.2 pg (ref 26.0–34.0)
MCHC: 31.9 g/dL (ref 30.0–36.0)
MCV: 98 fL (ref 80.0–100.0)
Platelets: 194 10*3/uL (ref 150–400)
RBC: 2.53 MIL/uL — ABNORMAL LOW (ref 3.87–5.11)
RDW: 15.4 % (ref 11.5–15.5)
WBC: 10.6 10*3/uL — ABNORMAL HIGH (ref 4.0–10.5)
nRBC: 0 % (ref 0.0–0.2)

## 2021-12-31 LAB — IRON AND TIBC
Iron: 68 ug/dL (ref 28–170)
Saturation Ratios: 27 % (ref 10.4–31.8)
TIBC: 255 ug/dL (ref 250–450)
UIBC: 187 ug/dL

## 2021-12-31 LAB — LACTIC ACID, PLASMA
Lactic Acid, Venous: 1.6 mmol/L (ref 0.5–1.9)
Lactic Acid, Venous: 1.4 mmol/L (ref 0.5–1.9)

## 2021-12-31 LAB — RETICULOCYTES
Immature Retic Fract: 27.3 % — ABNORMAL HIGH (ref 2.3–15.9)
RBC.: 3.37 MIL/uL — ABNORMAL LOW (ref 3.87–5.11)
Retic Count, Absolute: 93 10*3/uL (ref 19.0–186.0)
Retic Ct Pct: 2.8 % (ref 0.4–3.1)

## 2021-12-31 LAB — FOLATE: Folate: 9.6 ng/mL (ref >5.9)

## 2021-12-31 LAB — HEPARIN LEVEL (UNFRACTIONATED): Heparin Unfractionated: 0.61 IU/mL (ref 0.30–0.70)

## 2021-12-31 MED ORDER — CALCIUM ACETATE (PHOS BINDER) 667 MG PO CAPS
667.0000 mg | ORAL_CAPSULE | Freq: Three times a day (TID) | ORAL | Status: DC
  Administered 2021-12-31 – 2022-01-02 (×5): 667 mg via ORAL
  Filled 2021-12-31 (×6): qty 1

## 2021-12-31 MED ORDER — IPRATROPIUM-ALBUTEROL 0.5-2.5 (3) MG/3ML IN SOLN
3.0000 mL | RESPIRATORY_TRACT | Status: DC | PRN
  Administered 2022-01-01 (×2): 3 mL via RESPIRATORY_TRACT
  Filled 2021-12-31 (×2): qty 3

## 2021-12-31 MED ORDER — IPRATROPIUM-ALBUTEROL 0.5-2.5 (3) MG/3ML IN SOLN: 3.0000 mL | RESPIRATORY_TRACT | Status: DC | PRN

## 2021-12-31 MED ORDER — DEXTROSE 5 % IV SOLN
INTRAVENOUS | Status: DC
  Filled 2021-12-31: qty 150
  Filled 2021-12-31: qty 1000

## 2021-12-31 MED ORDER — APIXABAN 2.5 MG PO TABS
2.5000 mg | ORAL_TABLET | Freq: Two times a day (BID) | ORAL | Status: DC
  Administered 2021-12-31 – 2022-01-02 (×5): 2.5 mg via ORAL
  Filled 2021-12-31 (×5): qty 1

## 2021-12-31 MED ORDER — IPRATROPIUM-ALBUTEROL 20-100 MCG/ACT IN AERS: 1.0000 | INHALATION_SPRAY | RESPIRATORY_TRACT | Status: DC | PRN

## 2021-12-31 MED ORDER — IPRATROPIUM-ALBUTEROL 20-100 MCG/ACT IN AERS
1.0000 | INHALATION_SPRAY | RESPIRATORY_TRACT | Status: DC | PRN
  Administered 2021-12-31: 1 via RESPIRATORY_TRACT
  Filled 2021-12-31: qty 4

## 2021-12-31 MED ORDER — FUROSEMIDE 10 MG/ML IJ SOLN
40.0000 mg | Freq: Once | INTRAMUSCULAR | Status: AC
  Administered 2021-12-31: 40 mg via INTRAVENOUS
  Filled 2021-12-31: qty 4

## 2021-12-31 NOTE — Plan of Care (Signed)
  Problem: Education: Goal: Knowledge of General Education information will improve Description Including pain rating scale, medication(s)/side effects and non-pharmacologic comfort measures Outcome: Progressing   Problem: Health Behavior/Discharge Planning: Goal: Ability to manage health-related needs will improve Outcome: Progressing   

## 2021-12-31 NOTE — Assessment & Plan Note (Signed)
EF of 35 to 40% with global hypokinesis according to prior echo.  EF improved to 50 to 55% and grade 2 diastolic dysfunction on repeat echo done on 12/29/2021. clinically appears euvolemic.  Repeat chest x-ray with worsening pulmonary vascular congestion. -Give IV Lasix 40 mg once. -Monitor volume status closely -Daily BMP and weight -Strict intake and output

## 2021-12-31 NOTE — Progress Notes (Signed)
Progress Note   Patient: Samantha Dawson IHK:[id number] DOB: 02-27-1929 DOA: 12/28/2021     3 DOS: the patient was seen and examined on 12/31/2021   Brief hospital course: Taken from H&P.  ZAKIA SAINATO is a 88 y.o. female with medical history significant for coronary artery disease status post stent placement in November 2022, hyperlipidemia, GERD, chronic systolic/diastolic heart failure, COPD, who is admitted to [facility] on 12/28/2021 with acute kidney injury after presenting from home to [facility] ED complaining of chest pain.  It was sudden onset sharp, substernal chest pain without any radiation, no exertional component, nonpleuritic.  Pain was associated with some palpitations, no shortness of breath, nausea or vomiting.  She received a full dose of aspirin at home earlier and pain improved in about an hour.  On arrival to ED she was also found to have new onset A-fib with RVR, spontaneous conversion to sinus rhythm while she was in ED.  Labs pertinent for rising troponin, which peaked at 16, 606, AKI with creatinine of 5.5, baseline around 1 in December 2022, lipase elevated at 374, potassium of 2.6, magnesium 1.6 and mild transaminitis.  COVID-19 was positive with CT value of 43.0 which consistent with prior infection.  Apparently she was positive at the end of December 2022.  No need for any treatment or isolation.  Prior echocardiogram with EF of 35 to 40% and global hypokinesis, grade 3 diastolic dysfunction,moderately dilated bilateral atria, trivial mitral regurgitation, trivial aortic regurgitation.  Cardiology was consulted and she was started on heparin infusion by cardiology.  Per daughter patient is experiencing declining in her health since her COVID-19 infection, tested positive on 10/25/2021.  Recently PCP advised to follow-up with palliative care.  Nephrology was consulted due to persistent AKI with GFR of 7, normal in December.  Renal ultrasound without any obstruction, did show  renal cyst.  No bladder outlet obstruction, remained oliguric.  Palliative care was also consulted, patient is not ready for hospice discussion.  They might consider short-term dialysis.  No final decision yet. PT is recommending SNF.  2/25: Patient with worsening BUN, slight improvement in creatinine.  Worsening acidosis.  Had another long discussion between nephrology and patient, patient decided to consider hospice and does not want dialysis.  Cardiology discontinued heparin.  IV fluid switched with bicarb and she also received one-time dose of Lasix.  Repeat chest x-ray with worsening pulmonary vascular congestion.  Patient with multiorgan dysfunction.  Very high risk for deterioration and death.   Assessment and Plan: * NSTEMI (non-ST elevated myocardial infarction) ([place])- (present on admission) Remained chest pain-free. Patient admitted with chest pain, upward trending troponin which peaked at 16606 and now started trending down.  Chest pain has been resolved.  Numbers consistent with NSTEMI.  Recent history of NSTEMI s/p PCI in November 2022, compliant with aspirin and Brilinta.   Cardiology has been consulted. Her cardiologist Dr. Manuella Ghazi is [place]-Dr. Clayborn Bigness will be covering. Repeat echocardiogram with low normal EF, grade 2 diastolic dysfunction. -Heparin infusion was switched with Eliquis by cardiology today -Brilinta was switched with Plavix by cardiology to decrease the risk of bleeding -Will not be a candidate for cardiac catheter at this time due to AKI -Continue with metoprolol -Continue with Crestor -No further cardiac work-up planned. -Palliative care consult-patient would like to go with hospice on discharge.  Treat the treatable's  AKI (acute kidney injury) ([place])- (present on admission) Creatinine at 5.55>>5.14 t>>4.84 his morning, worsening BUN at 187 baseline around 1  in December 2022.  No contrast exposure. Renal ultrasound with no hydronephrosis or  nephrolithiasis.  Bilateral stable cyst.  No bladder outlet obstruction on bladder scan.  UOP seems improving -Nephrology was consulted-patient now considering hospice and decided against dialysis. -Concern of cardiorenal due to NSTEMI. -Poor p.o. intake can be contributory -Continue gentle fluid-changed to bicarb due to worsening acidosis. -She was given 1 dose of IV Lasix -Monitor renal function  Chronic combined systolic and diastolic heart failure ([place])- (present on admission) EF of 35 to 40% with global hypokinesis according to prior echo.  EF improved to 50 to 55% and grade 2 diastolic dysfunction on repeat echo done on 12/29/2021. clinically appears euvolemic.  Repeat chest x-ray with worsening pulmonary vascular congestion. -Give IV Lasix 40 mg once. -Monitor volume status closely -Daily BMP and weight -Strict intake and output  Increased anion gap metabolic acidosis Patient with worsening anion gap metabolic acidosis.  Multifactorial, most likely secondary to hypoxia with NSTEMI and worsening renal function. -Switch IV fluid with bicarb -Check lactic acid -Continue to monitor  Paroxysmal atrial fibrillation with RVR ([place])- (present on admission) New onset,CHA2DS2-VASc score of 6. Spontaneous conversion to sinus rhythm. Cardiology started her on amiodarone. Heparin was switched with Eliquis -Continue to monitor -Continue with metoprolol  Hypokalemia- (present on admission) Resolved. -Monitor electrolyte and replete as needed  Transaminitis- (present on admission) Can be due to cardiohepatic reason. -Hepatitis viral panel-pending -Acetaminophen level-pending  Essential hypertension- (present on admission) Blood pressure within goal.  On Lasix and metoprolol at home. -Continue with metoprolol -Giving 1 dose of IV Lasix today  Acute anemia- (present on admission) Hemoglobin at 7.9 without any obvious bleeding.  Baseline around 12 in December.  Patient was on aspirin  and Brilinta at home.  Poor p.o. intake. Anemia panel with abnormally normal reticulocyte, more consistent with anemia of chronic disease. -Check FOBT -Monitor hemoglobin -Transfuse if below 8  Acid reflux- (present on admission) - Continue with Protonix  Hyperlipidemia- (present on admission) - Continue with Crestor    Subjective: Patient was seen with nephrology today.  Having mild dyspnea.  No chest pain.  Oxygen requirement increased to 6 L.  2 daughters at bedside.  Continues to feel weak.  Physical Exam: Vitals:   12/31/21 0350 12/31/21 0759 12/31/21 1148 12/31/21 1548  BP: 93/60 136/60 (!) 146/53 (!) 90/58  Pulse: 89 90 82 82  Resp: 18 18 18 18   Temp: 97.9 F (36.6 C) 98 F (36.7 C) 97.6 F (36.4 C) (!) 97.5 F (36.4 C)  TempSrc: Oral     SpO2: 92% (!) 87% 93% 93%  Weight:      Height:       General.     In no acute distress. Pulmonary.  Bilateral basal crackles, normal respiratory effort. CV.  Regular rate and rhythm, no JVD, rub or murmur. Abdomen.  Soft, nontender, nondistended, BS positive. CNS.  Alert and oriented .  No focal neurologic deficit. Extremities.  No edema, no cyanosis, pulses intact and symmetrical. Psychiatry.  Judgment and insight appears normal.  Data Reviewed: Prior notes, labs and imaging reviewed  Family Communication: Discussed with 2 daughters at bedside  Disposition: Status is: Inpatient Remains inpatient appropriate because: Severity of illness  Planned Discharge Destination: To be determined  DVT prophylaxis.  Eliquis Time spent: 52 minutes  This record has been created using Systems analyst. Errors have been sought and corrected,but may not always be located. Such creation errors do not reflect on the  standard of care.  Author: Lorella Nimrod, MD 12/31/2021 4:02 PM  For on call review [email or web address].

## 2021-12-31 NOTE — Plan of Care (Signed)
  Problem: Health Behavior/Discharge Planning: Goal: Ability to manage health-related needs will improve Outcome: Progressing   Problem: Education: Goal: Knowledge of General Education information will improve Description: Including pain rating scale, medication(s)/side effects and non-pharmacologic comfort measures Outcome: Progressing   

## 2021-12-31 NOTE — Assessment & Plan Note (Signed)
Hemoglobin at 7.9 without any obvious bleeding.  Baseline around 12 in December.  Patient was on aspirin and Brilinta at home.  Poor p.o. intake. Anemia panel with abnormally normal reticulocyte, more consistent with anemia of chronic disease. -Check FOBT -Monitor hemoglobin -Transfuse if below 8

## 2021-12-31 NOTE — Progress Notes (Signed)
[facility] Cardiology    SUBJECTIVE: Patient states to be doing reasonably well denies any chest pain or shortness of breath.  Complains of generalized fatigue states to be doing reasonably well   Vitals:   12/30/21 2359 12/31/21 0000 12/31/21 0350 12/31/21 0759  BP:   93/60 136/60  Pulse:  99 89 90  Resp:   18 18  Temp:   97.9 F (36.6 C) 98 F (36.7 C)  TempSrc:   Oral   SpO2: 92%  92% (!) 87%  Weight:      Height:         Intake/Output Summary (Last 24 hours) at 12/31/2021 1143 Last data filed at 12/31/2021 1014 Gross per 24 hour  Intake 2222.07 ml  Output 950 ml  Net 1272.07 ml      PHYSICAL EXAM  General: Well developed, well nourished, in no acute distress HEENT:  Normocephalic and atramatic Neck:  No JVD.  Lungs: Clear bilaterally to auscultation and percussion. Heart: HRRR . Normal S1 and S2 without gallops or murmurs.  Abdomen: Bowel sounds are positive, abdomen soft and non-tender  Msk:  Back normal, normal gait. Normal strength and tone for age. Extremities: No clubbing, cyanosis or edema.   Neuro: Alert and oriented X 3. Psych:  Good affect, responds appropriately   LABS: Basic Metabolic Panel: Recent Labs    12/29/21 0555 12/29/21 1706 12/30/21 0638 12/31/21 0341  NA 142   < > 142 139  K 2.6*   < > 4.3 3.9  CL 103   < > 108 108  CO2 19*   < > 17* 15*  GLUCOSE 95   < > 107* 111*  BUN 151*   < > 148* 187*  CREATININE 5.55*   < > 5.14* 4.84*  CALCIUM 8.1*   < > 8.3* 8.1*  MG 1.6*  --  2.1  --   PHOS  --   --  8.7* 8.8*   < > = values in this interval not displayed.   Liver Function Tests: Recent Labs    12/29/21 0555 12/30/21 0638 12/31/21 0341  AST 198* 161*  --   ALT 126* 127*  --   ALKPHOS 53 53  --   BILITOT 0.6 0.5  --   PROT 6.7 6.7  --   ALBUMIN 2.6* 2.5* 2.4*   Recent Labs    12/29/21 0555  LIPASE 374*   CBC: Recent Labs    12/29/21 0555 12/30/21 0638 12/31/21 0341  WBC 10.2 9.7 10.6*  NEUTROABS 8.0*  --   --   HGB  8.8* 8.4* 7.9*  HCT 27.6* 26.2* 24.8*  MCV 98.6 96.7 98.0  PLT 180 207 194   Cardiac Enzymes: No results for input(s): CKTOTAL, CKMB, CKMBINDEX, TROPONINI in the last 72 hours. BNP: Invalid input(s): POCBNP D-Dimer: No results for input(s): DDIMER in the last 72 hours. Hemoglobin A1C: No results for input(s): HGBA1C in the last 72 hours. Fasting Lipid Panel: No results for input(s): CHOL, HDL, LDLCALC, TRIG, CHOLHDL, LDLDIRECT in the last 72 hours. Thyroid Function Tests: No results for input(s): TSH, T4TOTAL, T3FREE, THYROIDAB in the last 72 hours.  Invalid input(s): FREET3 Anemia Panel: Recent Labs    12/31/21 0341 12/31/21 0927  FOLATE 9.6  --   FERRITIN 381*  --   TIBC 255  --   IRON 68  --   RETICCTPCT  --  2.8    ECHOCARDIOGRAM COMPLETE  Result Date: 12/29/2021    ECHOCARDIOGRAM REPORT  Patient Name:   Samantha Dawson Fairbanks Date of Exam: 12/29/2021 Medical Rec #:  [id number]       Height:       60.0 in Accession #:    [id number]      Weight:       72.0 lb Date of Birth:  16-Jan-1929       BSA:          1.209 m Patient Age:    88 years        BP:           137/63 mmHg Patient Gender: F               HR:           71 bpm. Exam Location:  [facility] Procedure: 2D Echo, Cardiac Doppler and Color Doppler Indications:     I48.91 Atrial Fibrillation  History:         Patient has prior history of Echocardiogram examinations, most                  recent 08/19/2021. Risk Factors:Hypertension and Dyslipidemia.  Sonographer:     Cresenciano Lick RDCS Referring Phys:  [id number] Rhetta Mura Diagnosing Phys: Yolonda Kida MD IMPRESSIONS  1. Left ventricular ejection fraction, by estimation, is 55 to 60%. The left ventricle has normal function. The left ventricle has no regional wall motion abnormalities. Left ventricular diastolic parameters are consistent with Grade II diastolic dysfunction (pseudonormalization).  2. Right ventricular systolic function is normal. The right ventricular size  is normal.  3. Left atrial size was mildly dilated.  4. The mitral valve is abnormal. Mild mitral valve regurgitation. No evidence of mitral stenosis. Moderate mitral annular calcification.  5. The aortic valve is calcified. There is moderate calcification of the aortic valve. There is moderate thickening of the aortic valve. Aortic valve regurgitation is trivial. Aortic valve sclerosis/calcification is present, without any evidence of aortic stenosis. FINDINGS  Left Ventricle: Left ventricular ejection fraction, by estimation, is 55 to 60%. The left ventricle has normal function. The left ventricle has no regional wall motion abnormalities. The left ventricular internal cavity size was normal in size. There is  no left ventricular hypertrophy. Left ventricular diastolic parameters are consistent with Grade II diastolic dysfunction (pseudonormalization). Right Ventricle: The right ventricular size is normal. No increase in right ventricular wall thickness. Right ventricular systolic function is normal. Left Atrium: Left atrial size was mildly dilated. Right Atrium: Right atrial size was normal in size. Pericardium: There is no evidence of pericardial effusion. Mitral Valve: The mitral valve is abnormal. There is moderate thickening of the mitral valve leaflet(s). There is moderate calcification of the mitral valve leaflet(s). Normal mobility of the mitral valve leaflets. Moderate mitral annular calcification. Mild mitral valve regurgitation. No evidence of mitral valve stenosis. Tricuspid Valve: The tricuspid valve is normal in structure. Tricuspid valve regurgitation is mild. Aortic Valve: The aortic valve is calcified. There is moderate calcification of the aortic valve. There is moderate thickening of the aortic valve. There is moderate aortic valve annular calcification. Aortic valve regurgitation is trivial. Aortic valve sclerosis/calcification is present, without any evidence of aortic stenosis. Aortic valve  mean gradient measures 8.7 mmHg. Aortic valve peak gradient measures 12.9 mmHg. Aortic valve area, by VTI measures 0.91 cm. Pulmonic Valve: The pulmonic valve was normal in structure. Pulmonic valve regurgitation is not visualized. Aorta: The ascending aorta was not well visualized. IAS/Shunts: No atrial level shunt detected by color  flow Doppler.  LEFT VENTRICLE PLAX 2D LVIDd:         3.80 cm   Diastology LVIDs:         2.70 cm   LV e' medial:    3.42 cm/s LV PW:         1.20 cm   LV E/e' medial:  42.7 LV IVS:        1.40 cm   LV e' lateral:   6.57 cm/s LVOT diam:     1.80 cm   LV E/e' lateral: 22.2 LV SV:         40 LV SV Index:   33 LVOT Area:     2.54 cm  RIGHT VENTRICLE             IVC RV Basal diam:  3.50 cm     IVC diam: 1.50 cm RV S prime:     11.11 cm/s TAPSE (M-mode): 1.8 cm LEFT ATRIUM             Index        RIGHT ATRIUM           Index LA diam:        4.90 cm 4.05 cm/m   RA Area:     11.00 cm LA Vol (A2C):   68.6 ml 56.73 ml/m  RA Volume:   25.90 ml  21.42 ml/m LA Vol (A4C):   57.5 ml 47.55 ml/m LA Biplane Vol: 63.1 ml 52.19 ml/m  AORTIC VALVE AV Area (Vmax):    0.91 cm AV Area (Vmean):   0.82 cm AV Area (VTI):     0.91 cm AV Vmax:           179.67 cm/s AV Vmean:          141.667 cm/s AV VTI:            0.435 m AV Peak Grad:      12.9 mmHg AV Mean Grad:      8.7 mmHg LVOT Vmax:         64.35 cm/s LVOT Vmean:        45.750 cm/s LVOT VTI:          0.156 m LVOT/AV VTI ratio: 0.36  AORTA Ao Root diam: 2.90 cm MITRAL VALVE                TRICUSPID VALVE MV Area (PHT): 2.50 cm     TR Peak grad:   49.3 mmHg MV Decel Time: 303 msec     TR Vmax:        351.00 cm/s MV E velocity: 146.00 cm/s MV A velocity: 74.70 cm/s   SHUNTS MV E/A ratio:  1.95         Systemic VTI:  0.16 m                             Systemic Diam: 1.80 cm Kynsie Falkner D Adrieanna Boteler MD Electronically signed by Yolonda Kida MD Signature Date/Time: 12/29/2021/4:57:14 PM    Final      Echo preserved left ventricular function 55 to  60%  TELEMETRY: Normal sinus rhythm rate of about 70:  ASSESSMENT AND PLAN:  Principal Problem:   NSTEMI (non-ST elevated myocardial infarction) ([place]) Active Problems:   Hyperlipidemia   Essential hypertension   Acid reflux   Chronic combined systolic and diastolic heart failure (HCC)   AKI (acute kidney injury) ([place])   Chest  pain   Transaminitis   Hypokalemia   Acute anemia   Paroxysmal atrial fibrillation with RVR (HCC)    Plan Recent non-STEMI no significant chest pain recommend conservative management do not recommend invasive procedures 2 coronary disease status post PCI and stent DES continue medical therapy continue current therapy on aspirin and Brilinta 3 COPD continue chronic supplemental oxygen therapy inhaler therapy as necessary 4 troponin t elevated suggestive of a non-STEMI but no symptoms continue conservative management 5 acute on chronic renal insufficiency agree with nephrology input 6 atrial fibrillation rate controlled chronic stable 7 we will discontinue anticoagulation transition from IV heparin to aspirin 8 recommend conservative management for now 9 continue aspirin Plavix 10 transition from heparin to Eliquis because of atrial fibrillation 11  patient now in sinus rhythm continue amiodarone 12 recommend conservative therapy  Yolonda Kida, MD 12/31/2021 11:43 AM

## 2021-12-31 NOTE — Assessment & Plan Note (Signed)
Patient with worsening anion gap metabolic acidosis.  Multifactorial, most likely secondary to hypoxia with NSTEMI and worsening renal function. -Switch IV fluid with bicarb -Check lactic acid -Continue to monitor

## 2021-12-31 NOTE — Assessment & Plan Note (Signed)
Remained chest pain-free. Patient admitted with chest pain, upward trending troponin which peaked at [address] and now started trending down.  Chest pain has been resolved.  Numbers consistent with NSTEMI.  Recent history of NSTEMI s/p PCI in November 2022, compliant with aspirin and Brilinta.   Cardiology has been consulted. Her cardiologist Dr. Manuella Ghazi is [place] of [place]-Dr. Clayborn Bigness will be covering. Repeat echocardiogram with low normal EF, grade 2 diastolic dysfunction. -Heparin infusion was switched with Eliquis by cardiology today -Brilinta was switched with Plavix by cardiology to decrease the risk of bleeding -Will not be a candidate for cardiac catheter at this time due to AKI -Continue with metoprolol -Continue with Crestor -No further cardiac work-up planned. -Palliative care consult-patient would like to go with hospice on discharge.  Treat the treatable's

## 2021-12-31 NOTE — Assessment & Plan Note (Signed)
Blood pressure within goal.  On Lasix and metoprolol at home. -Continue with metoprolol -Giving 1 dose of IV Lasix today

## 2021-12-31 NOTE — Assessment & Plan Note (Signed)
New onset,CHA2DS2-VASc score of 6. Spontaneous conversion to sinus rhythm. Cardiology started her on amiodarone. Heparin was switched with Eliquis -Continue to monitor -Continue with metoprolol

## 2021-12-31 NOTE — Progress Notes (Signed)
MICHAEL VENTRESCA  MRN: [id number]  DOB/AGE: Jun 30, 1929 88 y.o.  Primary Care Physician:Sowles, Drue Stager, MD  Admit date: 12/28/2021  Chief Complaint:  Chief Complaint  Patient presents with   Chest Pain    S-Pt presented on  12/28/2021 with  Chief Complaint  Patient presents with   Chest Pain  .  Patient was seen today on second floor. Patient family was present in the room as well Patient major concern was that she was short of breath. Patient family's major concern was need/risk/benefit of dialysis. I then had extensive discussion with the patient and her family about their concerns.  After extensive discussion patient wanted to opt for hospice and did not want to go for dialysis  Medications  amiodarone  200 mg Oral BID   clopidogrel  75 mg Oral Daily   feeding supplement  237 mL Oral TID BM   metoprolol succinate  50 mg Oral Q breakfast   multivitamin with minerals  1 tablet Oral Daily   pantoprazole  20 mg Oral Q breakfast   rosuvastatin  20 mg Oral Daily   umeclidinium-vilanterol  1 puff Inhalation Daily         HEN:IDPOE from the symptoms mentioned above,there are no other symptoms referable to all systems reviewed.  Physical Exam: Vital signs in last 24 hours: Temp:  [97.6 F (36.4 C)-98.1 F (36.7 C)] 98 F (36.7 C) (02/25 0759) Pulse Rate:  [75-106] 90 (02/25 0759) Resp:  [17-18] 18 (02/25 0759) BP: (93-137)/(56-69) 136/60 (02/25 0759) SpO2:  [86 %-98 %] 87 % (02/25 0759) Weight change:  Last BM Date : 12/30/21  Intake/Output from previous day: 02/24 0701 - 02/25 0700 In: 2677.5 [P.O.:720; I.V.:1957.5] Out: 950 [Urine:950] No intake/output data recorded.   Physical Exam:  General- pt is awake,alert, oriented to time place and person  Resp- No acute REsp distress, rhonchi present  CVS- S1S2 regular in rate and rhythm  GIT- BS+, soft, Non tender , Non distended  EXT-Trace LE Edema,  No Cyanosis    Lab Results:  CBC  Recent Labs     12/30/21 0638 12/31/21 0341  WBC 9.7 10.6*  HGB 8.4* 7.9*  HCT 26.2* 24.8*  PLT 207 194    BMET  Recent Labs    12/30/21 0638 12/31/21 0341  NA 142 139  K 4.3 3.9  CL 108 108  CO2 17* 15*  GLUCOSE 107* 111*  BUN 148* 187*  CREATININE 5.14* 4.84*  CALCIUM 8.3* 8.1*      Most recent Creatinine trend  Lab Results  Component Value Date   CREATININE 4.84 (H) 12/31/2021   CREATININE 5.14 (H) 12/30/2021   CREATININE 5.14 (H) 12/29/2021      MICRO   Recent Results (from the past 240 hour(s))  Resp Panel by RT-PCR (Flu A&B, Covid) Nasopharyngeal Swab     Status: Abnormal   Collection Time: 12/28/21  8:25 PM   Specimen: Nasopharyngeal Swab; Nasopharyngeal(NP) swabs in vial transport medium  Result Value Ref Range Status   SARS Coronavirus 2 by RT PCR POSITIVE (A) NEGATIVE Final    Comment: (NOTE) SARS-CoV-2 target nucleic acids are DETECTED.  The SARS-CoV-2 RNA is generally detectable in upper respiratory specimens during the acute phase of infection. Positive results are indicative of the presence of the identified virus, but do not rule out bacterial infection or co-infection with other pathogens not detected by the test. Clinical correlation with patient history and other diagnostic information is necessary to determine patient infection status.  The expected result is Negative.  Fact Sheet for Patients: EntrepreneurPulse.com.au  Fact Sheet for Healthcare Providers: IncredibleEmployment.be  This test is not yet approved or cleared by the [place] FDA and  has been authorized for detection and/or diagnosis of SARS-CoV-2 by FDA under an Emergency Use Authorization (EUA).  This EUA will remain in effect (meaning this test can be used) for the duration of  the COVID-19 declaration under Section 564(b)(1) of the A ct, 21 U.S.C. section 360bbb-3(b)(1), unless the authorization is terminated or revoked sooner.      Influenza A by PCR NEGATIVE NEGATIVE Final   Influenza B by PCR NEGATIVE NEGATIVE Final    Comment: (NOTE) The Xpert Xpress SARS-CoV-2/FLU/RSV plus assay is intended as an aid in the diagnosis of influenza from Nasopharyngeal swab specimens and should not be used as a sole basis for treatment. Nasal washings and aspirates are unacceptable for Xpert Xpress SARS-CoV-2/FLU/RSV testing.  Fact Sheet for Patients: EntrepreneurPulse.com.au  Fact Sheet for Healthcare Providers: IncredibleEmployment.be  This test is not yet approved or cleared by the [place] FDA and has been authorized for detection and/or diagnosis of SARS-CoV-2 by FDA under an Emergency Use Authorization (EUA). This EUA will remain in effect (meaning this test can be used) for the duration of the COVID-19 declaration under Section 564(b)(1) of the Act, 21 U.S.C. section 360bbb-3(b)(1), unless the authorization is terminated or revoked.  Performed at [facility], [address] [place], [place] [address]          Impression:   Ms. KEMI GELL is a 88 y.o.  female with previous medical conditions including CAD, COPD, GERD, hyperlipidemia, and chronic systolic and diastolic heart failure, who was admitted to [facility] on 12/28/2021 for.Rapid atrial fibrillation (HCC) [I48.91] AKI (acute kidney injury) ([place]) [N17.9]   Acute kidney injury with a baseline function within normal range in December 2022.  Acute kidney injury likely multifactorial due to poor oral intake and severe dehydration along with recent cardiac event.  No exposure to IV contrast.  Renal ultrasound negative for obstruction.   Patient has worsening shortness of breath I did asked for chest x-ray chest x-ray does show worsening of congestion Patient also has worsening of acidosis Patient BUN is more than 150. I discussed with the patient and the family about need/risk/benefit of renal placement  therapy After discussion patient and the family opted for conservative care/hospice care and did not want to pursue aggressive measures such as renal replacement therapy    We provided patient and family with pertinent information needed for them to make an informed  We will give IV Lasix.   We will suggest to please continue to avoid any nephrotoxic agents or therapies.  Will request to please continue to avoid hypotension.   2.  Atrial fibrillation with chronic diastolic heart failure.  Echo completed shows EF 55 to 60% with a grade 2 diastolic dysfunction.  Currently on heparin drip.  Cardiology following     3 HTN  Blood pressure is stable    4. Anemia of chronic disease/GI bleed  CBC Latest Ref Rng & Units 12/31/2021 12/30/2021 12/29/2021  WBC 4.0 - 10.5 K/uL 10.6(H) 9.7 10.2  Hemoglobin 12.0 - 15.0 g/dL 7.9(L) 8.4(L) 8.8(L)  Hematocrit 36.0 - 46.0 % 24.8(L) 26.2(L) 27.6(L)  Platelets 150 - 400 K/uL 194 207 180    Patient most likely has GI bleed patient hemoglobin was 12.0 on October 24, 2021, was at nearly 10 on December 28, 2021 and  now it has come down to 8.0-7.9 Patient BUN is also trending up from 134 to now 187 Patient had a saturation done earlier were within normal limits   HGb is not at goal (9--11) Discussed with the primary team  5. 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 Patient phosphorus is high   Lab Results  Component Value Date   CALCIUM 8.1 (L) 12/31/2021   PHOS 8.8 (H) 12/31/2021    We will start patient on binders  5)Coronary artery disease NoN ST elevation MI Primary team and cardiology are following   6) Electrolytes   BMP Latest Ref Rng & Units 12/31/2021 12/30/2021 12/29/2021  Glucose 70 - 99 mg/dL 111(H) 107(H) 124(H)  BUN 8 - 23 mg/dL 187(H) 148(H) 97(H)  Creatinine 0.44 - 1.00 mg/dL 4.84(H) 5.14(H) 5.14(H)  BUN/Creat Ratio 6 - 22 (calc) - - -  Sodium 135 - 145 mmol/L 139  142 139  Potassium 3.5 - 5.1 mmol/L 3.9 4.3 3.7  Chloride 98 - 111 mmol/L 108 108 103  CO2 22 - 32 mmol/L 15(L) 17(L) 18(L)  Calcium 8.9 - 10.3 mg/dL 8.1(L) 8.3(L) 8.0(L)     Sodium Normonatremic   Potassium Normokalemic    7) Acute  metabolic acidosis   Anion gap 139-123 is equal to 16  Albumin 2.6  Delta anion gap 16-9 is equal to 7 Delta bicarb 24-15 is equal to 9   Co2 is not at goal  Patient has high anion gap acidosis as well as non- anion gap acidosis   Plan:   We will change patient IV fluids to have bicarb in an effort to help with non-anion gap acidosis We will give patient IV Lasix  Had extensive discussion with the patient and the family about need/risk/benefit of renal placement therapy -After extensive discussion patient opted for hospice/conservative care We will respect patient wishes We will continue to follow    Carmilla Granville s Fuquan Wilson 12/31/2021, 8:16 AM

## 2021-12-31 NOTE — Consult Note (Signed)
ANTICOAGULATION CONSULT NOTE  Pharmacy Consult for Eliquis Indication: afib   Allergies  Allergen Reactions   Alendronate Nausea Only    Patient Measurements: Height: 5' (152.4 cm) Weight: 34.5 kg (76 lb) IBW/kg (Calculated) : 45.5  Vital Signs: Temp: 97.6 F (36.4 C) (02/25 1148) Temp Source: Oral (02/25 0350) BP: 146/53 (02/25 1148) Pulse Rate: 82 (02/25 1148)  Labs: Recent Labs    12/29/21 0555 12/29/21 0555 12/29/21 1050 12/29/21 1706 12/29/21 2142 12/30/21 0638 12/30/21 0821 12/30/21 1802 12/31/21 0341  HGB 8.8*  --   --   --   --  8.4*  --   --  7.9*  HCT 27.6*  --   --   --   --  26.2*  --   --  24.8*  PLT 180  --   --   --   --  207  --   --  194  APTT  --   --   --   --  62*  --   --   --   --   LABPROT 15.0  --   --   --   --   --   --   --   --   INR 1.2  --   --   --   --   --   --   --   --   HEPARINUNFRC  --    < >  --   --  0.26*  --  0.15* 0.24* 0.61  CREATININE 5.55*  --   --  5.14*  --  5.14*  --   --  4.84*  TROPONINIHS 16,606*  --  11,360* 14,772*  --  8,917*  --   --   --    < > = values in this interval not displayed.     Estimated Creatinine Clearance: 4.2 mL/min (A) (by C-G formula based on SCr of 4.84 mg/dL (H)).   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   Encounter for chronic pain management    Hx of smoking 04/17/2016   Hyperlipidemia    Hypertension    Macular degeneration    Osteoarthritis    Osteoporosis 03/22/2016   Primary osteoarthritis of both hands 05/03/2016    Medications:  Scheduled:   amiodarone  200 mg Oral BID   clopidogrel  75 mg Oral Daily   feeding supplement  237 mL Oral TID BM   metoprolol succinate  50 mg Oral Q breakfast   multivitamin  with minerals  1 tablet Oral Daily   pantoprazole  20 mg Oral Q breakfast   rosuvastatin  20 mg Oral Daily   umeclidinium-vilanterol  1 puff Inhalation Daily    Assessment: Patient admitted with chest pain. PMH includes NSTEMI 09/2021 s/p PCI with DES to proximal LAD and residual chronic occlusion of proximal/mid LCx, HFrEF (LVEF 35-40% w/ global hypo, G3 DD, mild MS, mild to mod aortic sclerosis/calc 08/2021), hyperlipidemia, COPD on chronic oxygen. Pharmacy consulted to transition patient to Eliquis.   No anticoagulant PTA.    Goal of Therapy:  Monitor platelets by anticoagulation protocol: Yes   Plan:  Discontinue heparin and start apixaban 2.5 mg BID (age, weight, and Scr) CBC per protocol  Sherilyn Banker, PharmD Clinical Pharmacist  12/31/2021 12:48 PM

## 2021-12-31 NOTE — Consult Note (Signed)
ANTICOAGULATION CONSULT NOTE  Pharmacy Consult for Heparin drip Indication:  NSTEMI  Allergies  Allergen Reactions   Alendronate Nausea Only    Patient Measurements: Height: 5' (152.4 cm) Weight: 34.5 kg (76 lb) IBW/kg (Calculated) : 45.5 Heparin Dosing Weight: 32.7 kg  Vital Signs: Temp: 97.9 F (36.6 C) (02/25 0350) Temp Source: Oral (02/25 0350) BP: 93/60 (02/25 0350) Pulse Rate: 89 (02/25 0350)  Labs: Recent Labs    12/29/21 0555 12/29/21 0555 12/29/21 1050 12/29/21 1706 12/29/21 2142 12/30/21 0638 12/30/21 0821 12/30/21 1802 12/31/21 0341  HGB 8.8*  --   --   --   --  8.4*  --   --  7.9*  HCT 27.6*  --   --   --   --  26.2*  --   --  24.8*  PLT 180  --   --   --   --  207  --   --  194  APTT  --   --   --   --  62*  --   --   --   --   LABPROT 15.0  --   --   --   --   --   --   --   --   INR 1.2  --   --   --   --   --   --   --   --   HEPARINUNFRC  --    < >  --   --  0.26*  --  0.15* 0.24* 0.61  CREATININE 5.55*  --   --  5.14*  --  5.14*  --   --  4.84*  TROPONINIHS 16,606*  --  11,360* 14,772*  --  8,917*  --   --   --    < > = values in this interval not displayed.     Estimated Creatinine Clearance: 4.2 mL/min (A) (by C-G formula based on SCr of 4.84 mg/dL (H)).   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   Encounter for chronic pain management    Hx of smoking 04/17/2016   Hyperlipidemia    Hypertension    Macular degeneration    Osteoarthritis    Osteoporosis 03/22/2016   Primary osteoarthritis of both hands 05/03/2016    Medications:  Scheduled:   amiodarone  200 mg Oral BID   clopidogrel  75 mg Oral Daily   feeding supplement  237 mL Oral TID BM   metoprolol succinate  50  mg Oral Q breakfast   multivitamin with minerals  1 tablet Oral Daily   pantoprazole  20 mg Oral Q breakfast   rosuvastatin  20 mg Oral Daily   umeclidinium-vilanterol  1 puff Inhalation Daily    Assessment: Patient admitted with chest pain. PMH includes NSTEMI 09/2021 s/p PCI with DES to proximal LAD and residual chronic occlusion of proximal/mid LCx, HFrEF (LVEF 35-40% w/ global hypo, G3 DD, mild MS, mild to mod aortic sclerosis/calc 08/2021), hyperlipidemia, COPD on chronic oxygen.  No anticoagulant PTA. Troponin peak at 16,606  Of note, during previous admission 09/11/21, was therapeutic at 600 units/hr.  Baseline labs: Hgb 8.8 Plt 180 INR 1.2  Date/Time HL Rate/comment 2/23 2100 0.26 400 units/hr; subtherapeutic  2/23 0821 0.15 450 units/hr; subtherapeutic, inc to 550 u/hr 2/24 1802 0.24 Inc to 650 u/hr, subthera 2/25 0341 0.61 Therapeutic x 1    Goal of Therapy:  Heparin level 0.3-0.7 units/ml Monitor platelets by anticoagulation protocol: Yes   Plan:  Continue heparin infusion rate at 650 units/hr Recheck HL in 8-hrs to confirm CBC, heparin level daily  Renda Rolls, PharmD, [facility] 12/31/2021 5:43 AM

## 2021-12-31 NOTE — Assessment & Plan Note (Signed)
Creatinine at 5.55>>5.14 t>>4.84 his morning, worsening BUN at 187 baseline around 1 in December 2022.  No contrast exposure. Renal ultrasound with no hydronephrosis or nephrolithiasis.  Bilateral stable cyst.  No bladder outlet obstruction on bladder scan.  UOP seems improving -Nephrology was consulted-patient now considering hospice and decided against dialysis. -Concern of cardiorenal due to NSTEMI. -Poor p.o. intake can be contributory -Continue gentle fluid-changed to bicarb due to worsening acidosis. -She was given 1 dose of IV Lasix -Monitor renal function

## 2022-01-01 ENCOUNTER — Encounter: Payer: Self-pay | Admitting: Internal Medicine

## 2022-01-01 DIAGNOSIS — I214 Non-ST elevation (NSTEMI) myocardial infarction: Secondary | ICD-10-CM | POA: Diagnosis not present

## 2022-01-01 LAB — BASIC METABOLIC PANEL
Anion gap: 15 (ref 5–15)
BUN: 121 mg/dL — ABNORMAL HIGH (ref 8–23)
CO2: 23 mmol/L (ref 22–32)
Calcium: 8.4 mg/dL — ABNORMAL LOW (ref 8.9–10.3)
Chloride: 103 mmol/L (ref 98–111)
Creatinine, Ser: 4.77 mg/dL — ABNORMAL HIGH (ref 0.44–1.00)
GFR, Estimated: 8 mL/min — ABNORMAL LOW (ref >60)
Glucose, Bld: 132 mg/dL — ABNORMAL HIGH (ref 70–99)
Potassium: 3.5 mmol/L (ref 3.5–5.1)
Sodium: 141 mmol/L (ref 135–145)

## 2022-01-01 LAB — CBC
HCT: 24.4 % — ABNORMAL LOW (ref 36.0–46.0)
Hemoglobin: 7.9 g/dL — ABNORMAL LOW (ref 12.0–15.0)
MCH: 31.3 pg (ref 26.0–34.0)
MCHC: 32.4 g/dL (ref 30.0–36.0)
MCV: 96.8 fL (ref 80.0–100.0)
Platelets: 192 10*3/uL (ref 150–400)
RBC: 2.52 MIL/uL — ABNORMAL LOW (ref 3.87–5.11)
RDW: 15.7 % — ABNORMAL HIGH (ref 11.5–15.5)
WBC: 11.1 10*3/uL — ABNORMAL HIGH (ref 4.0–10.5)
nRBC: 0 % (ref 0.0–0.2)

## 2022-01-01 LAB — VITAMIN B12: Vitamin B-12: 1726 pg/mL — ABNORMAL HIGH (ref 180–914)

## 2022-01-01 MED ORDER — SODIUM BICARBONATE 650 MG PO TABS
650.0000 mg | ORAL_TABLET | Freq: Three times a day (TID) | ORAL | Status: DC
  Administered 2022-01-01 – 2022-01-02 (×3): 650 mg via ORAL
  Filled 2022-01-01 (×3): qty 1

## 2022-01-01 MED ORDER — DM-GUAIFENESIN ER 30-600 MG PO TB12
1.0000 | ORAL_TABLET | Freq: Two times a day (BID) | ORAL | Status: DC
Start: 2022-01-01 — End: 2022-01-01

## 2022-01-01 MED ORDER — GUAIFENESIN-DM 100-10 MG/5ML PO SYRP
15.0000 mL | ORAL_SOLUTION | Freq: Two times a day (BID) | ORAL | Status: DC
  Administered 2022-01-01 – 2022-01-02 (×2): 15 mL via ORAL
  Filled 2022-01-01 (×2): qty 15

## 2022-01-01 NOTE — Assessment & Plan Note (Signed)
Creatinine at 5.55>>5.14 t>>4.84>>4.77 his morning, worsening BUN at 187>>121 baseline around 1 in December 2022.  No contrast exposure. Renal ultrasound with no hydronephrosis or nephrolithiasis.  Bilateral stable cyst.  No bladder outlet obstruction on bladder scan.  UOP seems improving. Metabolic acidosis has been resolved. -Nephrology was consulted-patient now considering hospice and decided against dialysis. -Concern of cardiorenal due to NSTEMI. -Poor p.o. intake can be contributory -Encourage p.o. hydration -Monitor renal function

## 2022-01-01 NOTE — Assessment & Plan Note (Signed)
New onset,CHA2DS2-VASc score of 6. Spontaneous conversion to sinus rhythm. Cardiology started her on amiodarone. -Continue with Eliquis -Continue to monitor -Continue with metoprolol

## 2022-01-01 NOTE — Assessment & Plan Note (Addendum)
Hemoglobin stable at 7.9 without any obvious bleeding.  Baseline around 12 in December.  Patient was on aspirin and Brilinta at home.  Poor p.o. intake. Anemia panel with abnormally normal reticulocyte, more consistent with anemia of chronic disease. -Check FOBT -Monitor hemoglobin

## 2022-01-01 NOTE — Progress Notes (Signed)
Progress Note   Patient: Samantha Dawson HAL:[id number] DOB: 1929/07/27 DOA: 12/28/2021     4 DOS: the patient was seen and examined on 01/01/2022   Brief hospital course: Taken from H&P.  RUCHY WILDRICK is a 88 y.o. female with medical history significant for coronary artery disease status post stent placement in November 2022, hyperlipidemia, GERD, chronic systolic/diastolic heart failure, COPD, who is admitted to [facility] on 12/28/2021 with acute kidney injury after presenting from home to [facility] ED complaining of chest pain.  It was sudden onset sharp, substernal chest pain without any radiation, no exertional component, nonpleuritic.  Pain was associated with some palpitations, no shortness of breath, nausea or vomiting.  She received a full dose of aspirin at home earlier and pain improved in about an hour.  On arrival to ED she was also found to have new onset A-fib with RVR, spontaneous conversion to sinus rhythm while she was in ED.  Labs pertinent for rising troponin, which peaked at 16, 606, AKI with creatinine of 5.5, baseline around 1 in December 2022, lipase elevated at 374, potassium of 2.6, magnesium 1.6 and mild transaminitis.  COVID-19 was positive with CT value of 43.0 which consistent with prior infection.  Apparently she was positive at the end of December 2022.  No need for any treatment or isolation.  Prior echocardiogram with EF of 35 to 40% and global hypokinesis, grade 3 diastolic dysfunction,moderately dilated bilateral atria, trivial mitral regurgitation, trivial aortic regurgitation.  Cardiology was consulted and she was started on heparin infusion by cardiology.  Per daughter patient is experiencing declining in her health since her COVID-19 infection, tested positive on 10/25/2021.  Recently PCP advised to follow-up with palliative care.  Nephrology was consulted due to persistent AKI with GFR of 7, normal in December.  Renal ultrasound without any obstruction, did show  renal cyst.  No bladder outlet obstruction, remained oliguric.  Palliative care was also consulted, patient is not ready for hospice discussion.  They might consider short-term dialysis.  No final decision yet. PT is recommending SNF.  2/25: Patient with worsening BUN, slight improvement in creatinine.  Worsening acidosis.  Had another long discussion between nephrology and patient, patient decided to consider hospice and does not want dialysis.  Cardiology discontinued heparin.  IV fluid switched with bicarb and she also received one-time dose of Lasix.  Repeat chest x-ray with worsening pulmonary vascular congestion.  2/26: Renal function with some improvement today.  Having some cough.  Patient with multiorgan dysfunction.  Very high risk for deterioration and death.   Assessment and Plan: * NSTEMI (non-ST elevated myocardial infarction) ([place])- (present on admission) Remained chest pain-free. Patient admitted with chest pain, upward trending troponin which peaked at 16606 and now started trending down.  Chest pain has been resolved.  Numbers consistent with NSTEMI.  Recent history of NSTEMI s/p PCI in November 2022, compliant with aspirin and Brilinta.   Cardiology has been consulted. Her cardiologist Dr. Manuella Ghazi [place]-Dr. Clayborn Bigness will be covering. Repeat echocardiogram with low normal EF, grade 2 diastolic dysfunction. Received 48 hours of heparin infusion -Currently on Eliquis -Brilinta was switched with Plavix by cardiology to decrease the risk of bleeding -Will not be a candidate for cardiac catheter at this time due to AKI -Continue with metoprolol -Continue with Crestor -No further cardiac work-up planned. -Palliative care consult-patient would like to go with hospice on discharge.  Treat the treatable's  AKI (acute kidney injury) ([place])- (present on admission) Creatinine  at 5.55>>5.14 t>>4.84>>4.77 his morning, worsening BUN at 187>>121 baseline around 1 in December  2022.  No contrast exposure. Renal ultrasound with no hydronephrosis or nephrolithiasis.  Bilateral stable cyst.  No bladder outlet obstruction on bladder scan.  UOP seems improving. Metabolic acidosis has been resolved. -Nephrology was consulted-patient now considering hospice and decided against dialysis. -Concern of cardiorenal due to NSTEMI. -Poor p.o. intake can be contributory -Encourage p.o. hydration -Monitor renal function  Chronic combined systolic and diastolic heart failure ([place])- (present on admission) EF of 35 to 40% with global hypokinesis according to prior echo.  EF improved to 50 to 55% and grade 2 diastolic dysfunction on repeat echo done on 12/29/2021. clinically appears euvolemic.  Repeat chest x-ray with worsening pulmonary vascular congestion.  Received 1 dose of IV Lasix -Monitor volume status closely -Daily BMP and weight -Strict intake and output  Increased anion gap metabolic acidosis Improved.  Multifactorial, most likely secondary to hypoxia with NSTEMI and worsening renal function.  Lactic acid within normal limit -Discontinue bicarb infusion -Continue to monitor  Paroxysmal atrial fibrillation with RVR ([place])- (present on admission) New onset,CHA2DS2-VASc score of 6. Spontaneous conversion to sinus rhythm. Cardiology started her on amiodarone. -Continue with Eliquis -Continue to monitor -Continue with metoprolol  Hypokalemia- (present on admission) Resolved. -Monitor electrolyte and replete as needed  Transaminitis- (present on admission) Can be due to cardiohepatic reason. -Hepatitis viral panel-pending -Acetaminophen level-pending  Essential hypertension- (present on admission) Blood pressure soft this morning on Lasix and metoprolol at home. -Hold metoprolol -Continue to monitor  Acute anemia- (present on admission) Hemoglobin stable at 7.9 without any obvious bleeding.  Baseline around 12 in December.  Patient was on aspirin and Brilinta at  home.  Poor p.o. intake. Anemia panel with abnormally normal reticulocyte, more consistent with anemia of chronic disease. -Check FOBT -Monitor hemoglobin   Acid reflux- (present on admission) - Continue with Protonix  Hyperlipidemia- (present on admission) - Continue with Crestor     Subjective: Patient was seen and examined today.  No new complaints.  Niece at bedside.  Physical Exam: Vitals:   01/01/22 0449 01/01/22 0502 01/01/22 0802 01/01/22 1155  BP: (!) 128/58  (!) 83/53 (!) 125/59  Pulse: 88  86 92  Resp: 16  18 18   Temp: 97.6 F (36.4 C)  98 F (36.7 C) 97.6 F (36.4 C)  TempSrc: Oral     SpO2: 94% 93% 98% 97%  Weight: 40.2 kg     Height:       General.  Pleasant elderly lady, in no acute distress. Pulmonary.  Lungs clear bilaterally, normal respiratory effort. CV.  Regular rate and rhythm, no JVD, rub or murmur. Abdomen.  Soft, nontender, nondistended, BS positive. CNS.  Alert and oriented .  No focal neurologic deficit. Extremities.  No edema, no cyanosis, pulses intact and symmetrical. Psychiatry.  Judgment and insight appears normal.  Data Reviewed: I reviewed prior notes and labs  Family Communication: Discussed with niece at bedside  Disposition: Status is: Inpatient Remains inpatient appropriate because: Severity of illness   Planned Discharge Destination: To be determined, PT/OT are recommending SNF  DVT prophylaxis.  Eliquis Time spent: 45 minutes  This record has been created using Systems analyst. Errors have been sought and corrected,but may not always be located. Such creation errors do not reflect on the standard of care.  Author: Lorella Nimrod, MD 01/01/2022 3:28 PM  For on call review [email or web address].

## 2022-01-01 NOTE — Assessment & Plan Note (Signed)
Blood pressure soft this morning on Lasix and metoprolol at home. -Hold metoprolol -Continue to monitor

## 2022-01-01 NOTE — Assessment & Plan Note (Signed)
Remained chest pain-free. Patient admitted with chest pain, upward trending troponin which peaked at 16606 and now started trending down.  Chest pain has been resolved.  Numbers consistent with NSTEMI.  Recent history of NSTEMI s/p PCI in November 2022, compliant with aspirin and Brilinta.   Cardiology has been consulted. Her cardiologist Dr. Manuella Ghazi is [place]-Dr. Clayborn Bigness will be covering. Repeat echocardiogram with low normal EF, grade 2 diastolic dysfunction. Received 48 hours of heparin infusion -Currently on Eliquis -Brilinta was switched with Plavix by cardiology to decrease the risk of bleeding -Will not be a candidate for cardiac catheter at this time due to AKI -Continue with metoprolol -Continue with Crestor -No further cardiac work-up planned. -Palliative care consult-patient would like to go with hospice on discharge.  Treat the treatable's

## 2022-01-01 NOTE — Assessment & Plan Note (Signed)
Improved.  Multifactorial, most likely secondary to hypoxia with NSTEMI and worsening renal function.  Lactic acid within normal limit -Discontinue bicarb infusion -Continue to monitor

## 2022-01-01 NOTE — Progress Notes (Signed)
[facility]    SUBJECTIVE: Patient resting comfortably denies any chest pain or shortness of breath slightly hypotensive   Vitals:   01/01/22 1937 01/01/22 2300 01/01/22 2336 01/01/22 2352  BP:  (!) 98/59 92/60   Pulse:  (!) 112 (!) 102   Resp:  (!) 21 20 20   Temp:  98.1 F (36.7 C) 98.1 F (36.7 C) 97.8 F (36.6 C)  TempSrc:  Oral Oral   SpO2: 90% (!) 84% 95%   Weight:      Height:         Intake/Output Summary (Last 24 hours) at 01/01/2022 2353 Last data filed at 01/01/2022 1416 Gross per 24 hour  Intake 762.21 ml  Output 500 ml  Net 262.21 ml      PHYSICAL EXAM  General: Well developed, well nourished, in no acute distress HEENT:  Normocephalic and atramatic Neck:  No JVD.  Lungs: Clear bilaterally to auscultation and percussion. Heart: HRRR . Normal S1 and S2 without gallops or murmurs.  Abdomen: Bowel sounds are positive, abdomen soft and non-tender  Msk:  Back normal, normal gait. Normal strength and tone for age. Extremities: No clubbing, cyanosis or edema.   Neuro: Alert and oriented X 3. Psych:  Good affect, responds appropriately   LABS: Basic Metabolic Panel: Recent Labs    12/30/21 0638 12/31/21 0341 01/01/22 0658  NA 142 139 141  K 4.3 3.9 3.5  CL 108 108 103  CO2 17* 15* 23  GLUCOSE 107* 111* 132*  BUN 148* 187* 121*  CREATININE 5.14* 4.84* 4.77*  CALCIUM 8.3* 8.1* 8.4*  MG 2.1  --   --   PHOS 8.7* 8.8*  --    Liver Function Tests: Recent Labs    12/30/21 0638 12/31/21 0341  AST 161*  --   ALT 127*  --   ALKPHOS 53  --   BILITOT 0.5  --   PROT 6.7  --   ALBUMIN 2.5* 2.4*   No results for input(s): LIPASE, AMYLASE in the last 72 hours. CBC: Recent Labs    12/31/21 0341 01/01/22 0658  WBC 10.6* 11.1*  HGB 7.9* 7.9*  HCT 24.8* 24.4*  MCV 98.0 96.8  PLT 194 192   Cardiac Enzymes: No results for input(s): CKTOTAL, CKMB, CKMBINDEX, TROPONINI in the last 72 hours. BNP: Invalid input(s): POCBNP D-Dimer: No results for  input(s): DDIMER in the last 72 hours. Hemoglobin A1C: No results for input(s): HGBA1C in the last 72 hours. Fasting Lipid Panel: No results for input(s): CHOL, HDL, LDLCALC, TRIG, CHOLHDL, LDLDIRECT in the last 72 hours. Thyroid Function Tests: No results for input(s): TSH, T4TOTAL, T3FREE, THYROIDAB in the last 72 hours.  Invalid input(s): FREET3 Anemia Panel: Recent Labs    12/31/21 0341 12/31/21 0927 12/31/21 1140  VITAMINB12  --   --  1,726*  FOLATE 9.6  --   --   FERRITIN 381*  --   --   TIBC 255  --   --   IRON 68  --   --   RETICCTPCT  --  2.8  --     DG Chest 1 View  Result Date: 12/31/2021 CLINICAL DATA:  Shortness of breath EXAM: CHEST  1 VIEW COMPARISON:  Previous studies including the examination of 12/28/2021 FINDINGS: Transverse diameter of heart is increased. There is interval worsening of extensive interstitial and alveolar densities in both lungs. There is blunting of both lateral CP angles. Dense calcification is seen in the mitral annulus. There is no  pneumothorax. IMPRESSION: There is interval worsening of interstitial densities in both lungs suggesting worsening pulmonary edema. Possibility underlying interstitial pneumonia is not excluded. Small bilateral pleural effusions. Electronically Signed   By: Ernie Avena M.D.   On: 12/31/2021 11:56     Echo echo preserved left ventricular function 55 to 60%  TELEMETRY: Normal sinus rhythm nonspecific ST-T wave changes:  ASSESSMENT AND PLAN:  Principal Problem:   NSTEMI (non-ST elevated myocardial infarction) ([facility]) Active Problems:   Hyperlipidemia   Essential hypertension   Acid reflux   Chronic combined systolic and diastolic heart failure ([facility])   AKI (acute kidney injury) ([facility])   Chest pain   Transaminitis   Hypokalemia   Acute anemia   Paroxysmal atrial fibrillation with RVR ([facility])   Increased anion gap metabolic acidosis    Plan STEMI no further chest pain continue conservative management  medically 2 coronary disease status post PCI stent DES on aspirin and Brilinta 3 COPD supplemental oxygen as necessary as well as inhaler 4 troponins borderline studies suggest possible non-STEMI 5 acute on chronic renal sufficiency with nephrology input 6 atrial fibrillation rate controlled continue current concerns 7 maintain heparin consider switching to Eliquis for anticoagulation 8 sinus rhythm on amiodarone Continue current medical therapy  Alwyn Pea, MD 01/01/2022 11:53 PM

## 2022-01-01 NOTE — Assessment & Plan Note (Signed)
EF of 35 to 40% with global hypokinesis according to prior echo.  EF improved to 50 to 55% and grade 2 diastolic dysfunction on repeat echo done on 12/29/2021. clinically appears euvolemic.  Repeat chest x-ray with worsening pulmonary vascular congestion.  Received 1 dose of IV Lasix -Monitor volume status closely -Daily BMP and weight -Strict intake and output

## 2022-01-01 NOTE — Progress Notes (Signed)
Samantha Dawson  MRN: [id number]  DOB/AGE: [birth year] 88 y.o.  Primary Care Physician:Sowles, Drue Stager, MD  Admit date: 12/28/2021  Chief Complaint:  Chief Complaint  Patient presents with   Chest Pain    S-Pt presented on  12/28/2021 with  Chief Complaint  Patient presents with   Chest Pain  .  Patient was seen today on second floor. Patient offers no new specific physical complaints. Patient did not complain today of shortness of breath Patient family was present in the room I discussed with the patient regarding her kidney related issues Patient was seen   Medications  amiodarone  200 mg Oral BID   apixaban  2.5 mg Oral BID   calcium acetate  667 mg Oral TID WC   clopidogrel  75 mg Oral Daily   feeding supplement  237 mL Oral TID BM   metoprolol succinate  50 mg Oral Q breakfast   multivitamin with minerals  1 tablet Oral Daily   pantoprazole  20 mg Oral Q breakfast   rosuvastatin  20 mg Oral Daily   umeclidinium-vilanterol  1 puff Inhalation Daily         NIO:EVOJJ from the symptoms mentioned above,there are no other symptoms referable to all systems reviewed.  Physical Exam: Vital signs in last 24 hours: Temp:  [97.5 F (36.4 C)-98 F (36.7 C)] 98 F (36.7 C) (02/26 0802) Pulse Rate:  [82-89] 86 (02/26 0802) Resp:  [16-20] 18 (02/26 0802) BP: (83-146)/(53-60) 83/53 (02/26 0802) SpO2:  [92 %-98 %] 98 % (02/26 0802) Weight:  [40.2 kg] 40.2 kg (02/26 0449) Weight change:  Last BM Date : 12/30/21  Intake/Output from previous day: 02/25 0701 - 02/26 0700 In: 1623.9 [P.O.:480; I.V.:1143.9] Out: 500 [Urine:500] Total I/O In: 76.6 [I.V.:76.6] Out: -    Physical Exam:  General- pt is awake,alert, oriented to time place and person  Resp- No acute REsp distress, rhonchi present  CVS- S1S2 regular in rate and rhythm  GIT- BS+, soft, Non tender , Non distended  EXT-no LE Edema,  No Cyanosis    Lab Results:  CBC  Recent Labs     12/31/21 0341 01/01/22 0658  WBC 10.6* 11.1*  HGB 7.9* 7.9*  HCT 24.8* 24.4*  PLT 194 192    BMET  Recent Labs    12/31/21 0341 01/01/22 0658  NA 139 141  K 3.9 3.5  CL 108 103  CO2 15* 23  GLUCOSE 111* 132*  BUN 187* 121*  CREATININE 4.84* 4.77*  CALCIUM 8.1* 8.4*      Most recent Creatinine trend  Lab Results  Component Value Date   CREATININE 4.77 (H) 01/01/2022   CREATININE 4.84 (H) 12/31/2021   CREATININE 5.14 (H) 12/30/2021      MICRO   Recent Results (from the past 240 hour(s))  Resp Panel by RT-PCR (Flu A&B, Covid) Nasopharyngeal Swab     Status: Abnormal   Collection Time: 12/28/21  8:25 PM   Specimen: Nasopharyngeal Swab; Nasopharyngeal(NP) swabs in vial transport medium  Result Value Ref Range Status   SARS Coronavirus 2 by RT PCR POSITIVE (A) NEGATIVE Final    Comment: (NOTE) SARS-CoV-2 target nucleic acids are DETECTED.  The SARS-CoV-2 RNA is generally detectable in upper respiratory specimens during the acute phase of infection. Positive results are indicative of the presence of the identified virus, but do not rule out bacterial infection or co-infection with other pathogens not detected by the test. Clinical correlation with patient history and other diagnostic  information is necessary to determine patient infection status. The expected result is Negative.  Fact Sheet for Patients: EntrepreneurPulse.com.au  Fact Sheet for Healthcare Providers: IncredibleEmployment.be  This test is not yet approved or cleared by the [place] FDA and  has been authorized for detection and/or diagnosis of SARS-CoV-2 by FDA under an Emergency Use Authorization (EUA).  This EUA will remain in effect (meaning this test can be used) for the duration of  the COVID-19 declaration under Section 564(b)(1) of the A ct, 21 U.S.C. section 360bbb-3(b)(1), unless the authorization is terminated or revoked sooner.      Influenza A by PCR NEGATIVE NEGATIVE Final   Influenza B by PCR NEGATIVE NEGATIVE Final    Comment: (NOTE) The Xpert Xpress SARS-CoV-2/FLU/RSV plus assay is intended as an aid in the diagnosis of influenza from Nasopharyngeal swab specimens and should not be used as a sole basis for treatment. Nasal washings and aspirates are unacceptable for Xpert Xpress SARS-CoV-2/FLU/RSV testing.  Fact Sheet for Patients: EntrepreneurPulse.com.au  Fact Sheet for Healthcare Providers: IncredibleEmployment.be  This test is not yet approved or cleared by the [place] FDA and has been authorized for detection and/or diagnosis of SARS-CoV-2 by FDA under an Emergency Use Authorization (EUA). This EUA will remain in effect (meaning this test can be used) for the duration of the COVID-19 declaration under Section 564(b)(1) of the Act, 21 U.S.C. section 360bbb-3(b)(1), unless the authorization is terminated or revoked.  Performed at [facility], [address] [place], [place] [address]          Impression:   Samantha Dawson is a 88 y.o.  female with previous medical conditions including CAD, COPD, GERD, hyperlipidemia, and chronic systolic and diastolic heart failure, who was admitted to [facility] on 12/28/2021 for.Rapid atrial fibrillation (HCC) [I48.91] AKI (acute kidney injury) ([place]) [N17.9]   Acute kidney injury with a baseline function within normal range in December 2022.  Acute kidney injury likely multifactorial due to poor oral intake and severe dehydration along with recent cardiac event.  No exposure to IV contrast.  Renal ultrasound negative for obstruction.   Patient had worsening shortness of breath I did asked for chest x-ray chest x-ray does show worsening of congestion Patient also has worsening of acidosis Patient BUN was more than 150. I discussed with the patient and the family about need/risk/benefit of renal placement  therapy After discussion patient and the family opted for conservative care/hospice care and did not want to pursue aggressive measures such as renal replacement therapy   patient IV fluids were changed Patient was given diuretics yesterday Patient BUN is better, it has improved from more than 182 now 120 Patient creatinine has improved from 5.6 to now 4.7    2.  Atrial fibrillation with chronic diastolic heart failure.  Echo completed shows EF 55 to 60% with a grade 2 diastolic dysfunction.  Patient was on heparin drip.  Cardiology following     3 HTN  Blood pressure is stable    4. Anemia of chronic disease.  CBC Latest Ref Rng & Units 01/01/2022 12/31/2021 12/30/2021  WBC 4.0 - 10.5 K/uL 11.1(H) 10.6(H) 9.7  Hemoglobin 12.0 - 15.0 g/dL 7.9(L) 7.9(L) 8.4(L)  Hematocrit 36.0 - 46.0 % 24.4(L) 24.8(L) 26.2(L)  Platelets 150 - 400 K/uL 192 194 207    Patient had a saturation done earlier were within normal limits  Hemoglobin is stable  5. 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 Patient phosphorus is  high   Lab Results  Component Value Date   CALCIUM 8.4 (L) 01/01/2022   PHOS 8.8 (H) 12/31/2021     patient on binders  5)Coronary artery disease NoN ST elevation MI Primary team and cardiology are following   6) Electrolytes   BMP Latest Ref Rng & Units 01/01/2022 12/31/2021 12/30/2021  Glucose 70 - 99 mg/dL 132(H) 111(H) 107(H)  BUN 8 - 23 mg/dL 121(H) 187(H) 148(H)  Creatinine 0.44 - 1.00 mg/dL 4.77(H) 4.84(H) 5.14(H)  BUN/Creat Ratio 6 - 22 (calc) - - -  Sodium 135 - 145 mmol/L 141 139 142  Potassium 3.5 - 5.1 mmol/L 3.5 3.9 4.3  Chloride 98 - 111 mmol/L 103 108 108  CO2 22 - 32 mmol/L 23 15(L) 17(L)  Calcium 8.9 - 10.3 mg/dL 8.4(L) 8.1(L) 8.3(L)     Sodium Normonatremic   Potassium Normokalemic    7) Acute  metabolic acidosis   Patient acidosis responded well to bicarb drip We will  discontinue patient's IV drip in an effort to avoid fluid overload We will start patient on p.o. sodium bicarb  Plan:  Will DC IV fluids We will hold off on giving another dose of Lasix We will follow We will start patient on p.o. bicarb  I have had extensive discussion with the patient and the family about need/risk/benefit of renal placement therapy, after extensive discussion patient opted for hospice/conservative care. We will respect patient wishes.We will continue to follow    Jazsmine Macari s Theador Hawthorne 01/01/2022, 8:49 AM

## 2022-01-01 NOTE — Plan of Care (Signed)

## 2022-01-02 DIAGNOSIS — Z515 Encounter for palliative care: Secondary | ICD-10-CM | POA: Diagnosis not present

## 2022-01-02 DIAGNOSIS — I214 Non-ST elevation (NSTEMI) myocardial infarction: Secondary | ICD-10-CM | POA: Diagnosis not present

## 2022-01-02 DIAGNOSIS — N179 Acute kidney failure, unspecified: Secondary | ICD-10-CM | POA: Diagnosis not present

## 2022-01-02 DIAGNOSIS — I5042 Chronic combined systolic (congestive) and diastolic (congestive) heart failure: Secondary | ICD-10-CM | POA: Diagnosis not present

## 2022-01-02 LAB — CBC
HCT: 21.7 % — ABNORMAL LOW (ref 36.0–46.0)
Hemoglobin: 7.1 g/dL — ABNORMAL LOW (ref 12.0–15.0)
MCH: 32 pg (ref 26.0–34.0)
MCHC: 32.7 g/dL (ref 30.0–36.0)
MCV: 97.7 fL (ref 80.0–100.0)
Platelets: 175 10*3/uL (ref 150–400)
RBC: 2.22 MIL/uL — ABNORMAL LOW (ref 3.87–5.11)
RDW: 15.9 % — ABNORMAL HIGH (ref 11.5–15.5)
WBC: 13.3 10*3/uL — ABNORMAL HIGH (ref 4.0–10.5)
nRBC: 0 % (ref 0.0–0.2)

## 2022-01-02 LAB — BASIC METABOLIC PANEL
Anion gap: 20 — ABNORMAL HIGH (ref 5–15)
BUN: 105 mg/dL — ABNORMAL HIGH (ref 8–23)
CO2: 23 mmol/L (ref 22–32)
Calcium: 8.6 mg/dL — ABNORMAL LOW (ref 8.9–10.3)
Chloride: 101 mmol/L (ref 98–111)
Creatinine, Ser: 4.69 mg/dL — ABNORMAL HIGH (ref 0.44–1.00)
GFR, Estimated: 8 mL/min — ABNORMAL LOW (ref >60)
Glucose, Bld: 142 mg/dL — ABNORMAL HIGH (ref 70–99)
Potassium: 3.5 mmol/L (ref 3.5–5.1)
Sodium: 144 mmol/L (ref 135–145)

## 2022-01-02 LAB — PROCALCITONIN: Procalcitonin: 0.32 ng/mL

## 2022-01-02 MED ORDER — POLYVINYL ALCOHOL 1.4 % OP SOLN
1.0000 [drp] | Freq: Four times a day (QID) | OPHTHALMIC | Status: DC | PRN
  Filled 2022-01-02: qty 15

## 2022-01-02 MED ORDER — ONDANSETRON 4 MG PO TBDP
4.0000 mg | ORAL_TABLET | Freq: Four times a day (QID) | ORAL | Status: DC | PRN
  Filled 2022-01-02: qty 1

## 2022-01-02 MED ORDER — LORAZEPAM 2 MG/ML IJ SOLN: 1.0000 mg | INTRAMUSCULAR | Status: DC | PRN

## 2022-01-02 MED ORDER — BIOTENE DRY MOUTH MT LIQD: 15.0000 mL | OROMUCOSAL | Status: DC | PRN

## 2022-01-02 MED ORDER — GLYCOPYRROLATE 1 MG PO TABS: 1.0000 mg | ORAL_TABLET | ORAL | Status: DC | PRN

## 2022-01-02 MED ORDER — GLYCOPYRROLATE 0.2 MG/ML IJ SOLN
0.2000 mg | INTRAMUSCULAR | Status: DC | PRN
  Filled 2022-01-02: qty 1

## 2022-01-02 MED ORDER — HALOPERIDOL LACTATE 2 MG/ML PO CONC
0.5000 mg | ORAL | Status: DC | PRN
  Filled 2022-01-02: qty 0.3

## 2022-01-02 MED ORDER — HALOPERIDOL 0.5 MG PO TABS: 0.5000 mg | ORAL_TABLET | ORAL | Status: DC | PRN

## 2022-01-02 MED ORDER — HALOPERIDOL LACTATE 5 MG/ML IJ SOLN: 0.5000 mg | INTRAMUSCULAR | Status: DC | PRN

## 2022-01-02 MED ORDER — ONDANSETRON HCL 4 MG/2ML IJ SOLN
4.0000 mg | Freq: Four times a day (QID) | INTRAMUSCULAR | Status: DC | PRN
  Administered 2022-01-02: 4 mg via INTRAVENOUS
  Filled 2022-01-02: qty 2

## 2022-01-02 MED ORDER — ONDANSETRON HCL 4 MG/2ML IJ SOLN
4.0000 mg | Freq: Four times a day (QID) | INTRAMUSCULAR | Status: DC | PRN
Start: 2022-01-02 — End: 2022-01-03

## 2022-01-02 MED ORDER — HYDROMORPHONE HCL 1 MG/ML IJ SOLN
0.5000 mg | INTRAMUSCULAR | Status: DC | PRN
  Administered 2022-01-02 – 2022-01-03 (×3): 0.5 mg via INTRAVENOUS
  Filled 2022-01-02 (×3): qty 1

## 2022-01-02 MED ORDER — HYDROMORPHONE HCL 1 MG/ML IJ SOLN
0.5000 mg | INTRAMUSCULAR | Status: DC | PRN
  Administered 2022-01-02: 0.5 mg via INTRAVENOUS
  Filled 2022-01-02: qty 1

## 2022-01-02 MED ORDER — FUROSEMIDE 10 MG/ML IJ SOLN
40.0000 mg | Freq: Once | INTRAMUSCULAR | Status: AC
  Administered 2022-01-02: 40 mg via INTRAVENOUS
  Filled 2022-01-02: qty 4

## 2022-01-02 MED ORDER — HYDROCODONE-ACETAMINOPHEN 5-325 MG PO TABS
1.0000 | ORAL_TABLET | Freq: Four times a day (QID) | ORAL | Status: DC
  Filled 2022-01-02: qty 1

## 2022-01-02 NOTE — Progress Notes (Signed)
Progress Note   Patient: Samantha Dawson:[id number] DOB: [birth year] DOA: 12/28/2021     5 DOS: the patient was seen and examined on 01/02/2022   Brief hospital course: Taken from H&P.  Samantha Dawson is a 88 y.o. female with medical history significant for coronary artery disease status post stent placement in November 2022, hyperlipidemia, GERD, chronic systolic/diastolic heart failure, COPD, who is admitted to [facility] on 12/28/2021 with acute kidney injury after presenting from home to [facility] ED complaining of chest pain.  It was sudden onset sharp, substernal chest pain without any radiation, no exertional component, nonpleuritic.  Pain was associated with some palpitations, no shortness of breath, nausea or vomiting.  She received a full dose of aspirin at home earlier and pain improved in about an hour.  On arrival to ED she was also found to have new onset A-fib with RVR, spontaneous conversion to sinus rhythm while she was in ED.  Labs pertinent for rising troponin, which peaked at 16, 606, AKI with creatinine of 5.5, baseline around 1 in December 2022, lipase elevated at 374, potassium of 2.6, magnesium 1.6 and mild transaminitis.  COVID-19 was positive with CT value of 43.0 which consistent with prior infection.  Apparently she was positive at the end of December 2022.  No need for any treatment or isolation.  Prior echocardiogram with EF of 35 to 40% and global hypokinesis, grade 3 diastolic dysfunction,moderately dilated bilateral atria, trivial mitral regurgitation, trivial aortic regurgitation.  Cardiology was consulted and she was started on heparin infusion by cardiology.  Per daughter patient is experiencing declining in her health since her COVID-19 infection, tested positive on 10/25/2021.  Recently PCP advised to follow-up with palliative care.  Nephrology was consulted due to persistent AKI with GFR of 7, normal in December.  Renal ultrasound without any obstruction, did show  renal cyst.  No bladder outlet obstruction, remained oliguric.  Palliative care was also consulted, patient is not ready for hospice discussion.  They might consider short-term dialysis.  No final decision yet. PT is recommending SNF.  2/25: Patient with worsening BUN, slight improvement in creatinine.  Worsening acidosis.  Had another long discussion between nephrology and patient, patient decided to consider hospice and does not want dialysis.  Cardiology discontinued heparin.  IV fluid switched with bicarb and she also received one-time dose of Lasix.  Repeat chest x-ray with worsening pulmonary vascular congestion.  2/26: Renal function with some improvement today.  Having some cough.  2/27: Patient with worsening back pain and hypoxia.  She was placed on heated high flow at 45 L of oxygen, 60% FiO2 overnight.  She seems very dusky, pale and in distress when seen this morning.  Concern of an another heart attack, family will avoid more lab work and decided to proceed with comfort measures only.  We will avoid more sticks and comfort measures started. She is being evaluated for home hospice-awaiting final recommendations.  Patient with multiorgan dysfunction.  Very high risk for deterioration and death.   Assessment and Plan: * NSTEMI (non-ST elevated myocardial infarction) ([place])- (present on admission) Concern of another heart attack with her symptoms of worsening back pain, becoming more hypoxic and appears in distress.  Family and patient does not want another stick to repeat troponin.  She wants to keep her just comfortable.  Patient admitted with chest pain, upward trending troponin which peaked at [address]606 and now started trending down.  Chest pain has been resolved.  Numbers consistent with NSTEMI.  Recent history of NSTEMI s/p PCI in November 2022, compliant with aspirin and Brilinta.   Cardiology has been consulted. Her cardiologist Dr. Manuella Ghazi [place]-Dr. Clayborn Bigness will be  covering. Repeat echocardiogram with low normal EF, grade 2 diastolic dysfunction. Received 48 hours of heparin infusion -Currently on Eliquis -Brilinta was switched with Plavix by cardiology to decrease the risk of bleeding -Will not be a candidate for cardiac catheter at this time due to AKI -No further cardiac work-up planned. -Family and patient decided to pursue comfort measures only.  AKI (acute kidney injury) ([place])- (present on admission) Creatinine at 5.55>>5.14 t>>4.84>>4.77>>4.69 ,  baseline around 1 in December 2022.  No contrast exposure. Renal ultrasound with no hydronephrosis or nephrolithiasis.  Bilateral stable cyst.  No bladder outlet obstruction on bladder scan.  UOP seems improving. Metabolic acidosis has been resolved. -Nephrology was consulted- -Concern of cardiorenal due to NSTEMI. -Poor p.o. intake can be contributory -Encourage p.o. hydration -Patient is now comfort measures only  Chronic combined systolic and diastolic heart failure ([place])- (present on admission) EF of 35 to 40% with global hypokinesis according to prior echo.  EF improved to 50 to 55% and grade 2 diastolic dysfunction on repeat echo done on 12/29/2021. clinically appears euvolemic.  Repeat chest x-ray with worsening pulmonary vascular congestion.  Received 1 dose of IV Lasix -Monitor volume status closely -Daily BMP and weight -Strict intake and output  Increased anion gap metabolic acidosis Improved.  Multifactorial, most likely secondary to hypoxia with NSTEMI and worsening renal function.  Lactic acid within normal limit -Discontinue bicarb infusion -Continue to monitor  Paroxysmal atrial fibrillation with RVR ([place])- (present on admission) New onset,CHA2DS2-VASc score of 6. Spontaneous conversion to sinus rhythm. Cardiology started her on amiodarone. -Continue with Eliquis -Continue to monitor -Continue with metoprolol  Hypokalemia- (present on admission) Resolved. -Monitor  electrolyte and replete as needed  Transaminitis- (present on admission) Can be due to cardiohepatic reason. -Hepatitis viral panel-pending -Acetaminophen level-pending  Essential hypertension- (present on admission) Blood pressure soft this morning on Lasix and metoprolol at home. -Hold metoprolol -Continue to monitor  Acute anemia- (present on admission) Hemoglobin with slowly declined, at 7.1 this morning baseline around 12 in December.  Patient was on aspirin and Brilinta at home.  Poor p.o. intake. Anemia panel with abnormally normal reticulocyte, more consistent with anemia of chronic disease.  No obvious bleeding. -Patient was offered a blood transfusion which she and family declined as they decided to proceed with comfort measures only. -No more labs  Acid reflux- (present on admission) - Continue with Protonix  Hyperlipidemia- (present on admission) - Continue with Crestor    Subjective: Patient was having back pain and nausea with some dry heaving when seen today.  She appears very dusky and pale.  Granddaughter at bedside.  Palliative care was also present during morning rounds and family along with patient wants to stay comfortable only.  No more escalation of care or labs.  Physical Exam: Vitals:   01/02/22 0248 01/02/22 0500 01/02/22 0801 01/02/22 1148  BP: (!) 141/65 (!) 153/68 (!) 153/66 134/63  Pulse: 99 (!) 102 93 81  Resp: 18 20 17 20   Temp: 97.8 F (36.6 C) 97.9 F (36.6 C) 97.8 F (36.6 C) 97.6 F (36.4 C)  TempSrc: Oral Oral Oral   SpO2: 98% 100% 100% 99%  Weight:  43.3 kg    Height:       General.  Patient appears pale and dusky, looks in distress due to  pain. Pulmonary.  Few basal crackles, normal respiratory effort. CV.  Regular rate and rhythm, no JVD, rub or murmur. Abdomen.  Soft, nontender, nondistended, BS positive. CNS.  Alert and oriented .  No focal neurologic deficit. Extremities.  No edema, no cyanosis, pulses intact and  symmetrical. Psychiatry.  Judgment and insight appears normal.  Data Reviewed: Prior notes, labs and imaging reviewed.  Family Communication: 2 daughters and granddaughter at bedside.  Disposition: Status is: Inpatient Remains inpatient appropriate because: Severity of illness   Planned Discharge Destination: Hospice home  Patient is now comfort measures only.  Time spent: 55 minutes  This record has been created using Systems analyst. Errors have been sought and corrected,but may not always be located. Such creation errors do not reflect on the standard of care.  Author: Lorella Nimrod, MD 01/02/2022 3:15 PM  For on call review [email or web address].

## 2022-01-02 NOTE — Progress Notes (Signed)
°   01/01/22 2300  Assess: MEWS Score  Temp 98.1 F (36.7 C)  BP (!) 98/59  Pulse Rate (!) 112  Resp (!) 21  Level of Consciousness Alert  SpO2 (!) 84 %  O2 Device Nasal Cannula  Patient Activity (if Appropriate) In bed  O2 Flow Rate (L/min) 6 L/min  Assess: MEWS Score  MEWS Temp 0  MEWS Systolic 1  MEWS Pulse 2  MEWS RR 1  MEWS LOC 0  MEWS Score 4  MEWS Score Color Red  Assess: if the MEWS score is Yellow or Red  Were vital signs taken at a resting state? No  Focused Assessment Change from prior assessment (see assessment flowsheet)  Does the patient meet 2 or more of the SIRS criteria? No  MEWS guidelines implemented *See Row Information* Yes  Treat  MEWS Interventions Escalated (See documentation below)  Pain Scale PAINAD  Pain Score 3  Breathing 0  Negative Vocalization 1  Facial Expression 1  Body Language 1  Consolability 0  PAINAD Score 3  Complains of Congestion;Shortness of breath  Interventions Medication (see MAR);Other (comment) (NRB)  Patients response to intervention Decreased  Take Vital Signs  Increase Vital Sign Frequency  Red: Q 1hr X 4 then Q 4hr X 4, if remains red, continue Q 4hrs  Escalate  MEWS: Escalate Red: discuss with charge nurse/RN and provider, consider discussing with RRT (interventions in place- improving/ successful)  Notify: Charge Nurse/RN  Name of Charge Nurse/RN Notified Geologist, engineering  Date Charge Nurse/RN Notified 01/01/22  Time Charge Nurse/RN Notified 2345  Document  Patient Outcome Stabilized after interventions  Progress note created (see row info) Yes  Assess: SIRS CRITERIA  SIRS Temperature  0  SIRS Pulse 1  SIRS Respirations  1  SIRS WBC 0  SIRS Score Sum  2

## 2022-01-02 NOTE — Assessment & Plan Note (Signed)
Creatinine at 5.55>>5.14 t>>4.84>>4.77>>4.69 ,  baseline around 1 in December 2022.  No contrast exposure. Renal ultrasound with no hydronephrosis or nephrolithiasis.  Bilateral stable cyst.  No bladder outlet obstruction on bladder scan.  UOP seems improving. Metabolic acidosis has been resolved. -Nephrology was consulted- -Concern of cardiorenal due to NSTEMI. -Poor p.o. intake can be contributory -Encourage p.o. hydration -Patient is now comfort measures only

## 2022-01-02 NOTE — Care Management Important Message (Signed)
Important Message  Patient Details  Name: Samantha Dawson MRN: [id number] Date of Birth: Jul 22, 1929   Medicare Important Message Given:  Yes  Patient is comfort care with plans to transfer to the Hospice Home. Out of respect for the patient and family no Important Message from [facility] given.   Juliann Pulse A Tanara Turvey 01/02/2022, 1:41 PM

## 2022-01-02 NOTE — Progress Notes (Addendum)
Manufacturing engineer [facility]) Hospital Liaison Note  Received request from Transitions of Care Manager Judson Roch for family interest in [facility]. Visited patient at bedside and spoke with daughter(s) [name] & Benjamine Mola to confirm interest and explain services.  Approval for Hospice Home is determined by [facility] MD. Once [facility] MD has determined Hospice Home eligibility, [place] will update hospital staff and family.  Addendum: Patient has been approved for IPU. Plan is for patient to DC on 2.28 after 02 is weaned to 15 lpm. Family, TOC and MD aware. MSW to arrange transport tomorrow am for 10:30a.  Please do not hesitate to call with any hospice related questions.    Thank you for the opportunity to participate in this patient's care.  Daphene Calamity, MSW [facility] Liaison  [phone]

## 2022-01-02 NOTE — Progress Notes (Signed)
Pulled scheduled medications and when went to administer palliative care NP instructed not to administer. Stated that she is changing orders, pt is sleeping. Returned meds to pyxis.

## 2022-01-02 NOTE — Progress Notes (Signed)
°   01/02/22 1300  Clinical Encounter Type  Visited With Family  Visit Type Initial;Critical Care  Referral From Palliative care team   Chaplain followed up on [facility] consult and arrived just as a lot of family member's arrived, plus patient was sleeping. Chaplain will follow up.

## 2022-01-02 NOTE — Assessment & Plan Note (Signed)
Concern of another heart attack with her symptoms of worsening back pain, becoming more hypoxic and appears in distress.  Family and patient does not want another stick to repeat troponin.  She wants to keep her just comfortable.  Patient admitted with chest pain, upward trending troponin which peaked at [address]606 and now started trending down.  Chest pain has been resolved.  Numbers consistent with NSTEMI.  Recent history of NSTEMI s/p PCI in November 2022, compliant with aspirin and Brilinta.   Cardiology has been consulted. Her cardiologist Dr. Manuella Ghazi [place]-Dr. Clayborn Bigness will be covering. Repeat echocardiogram with low normal EF, grade 2 diastolic dysfunction. Received 48 hours of heparin infusion -Currently on Eliquis -Brilinta was switched with Plavix by cardiology to decrease the risk of bleeding -Will not be a candidate for cardiac catheter at this time due to AKI -No further cardiac work-up planned. -Family and patient decided to pursue comfort measures only.

## 2022-01-02 NOTE — Progress Notes (Signed)
Pt transitioned to 6L [place], tolerating well at this time.

## 2022-01-02 NOTE — Assessment & Plan Note (Signed)
Hemoglobin with slowly declined, at 7.1 this morning baseline around 12 in December.  Patient was on aspirin and Brilinta at home.  Poor p.o. intake. Anemia panel with abnormally normal reticulocyte, more consistent with anemia of chronic disease.  No obvious bleeding. -Patient was offered a blood transfusion which she and family declined as they decided to proceed with comfort measures only. -No more labs

## 2022-01-02 NOTE — Progress Notes (Signed)
Samantha Dawson  MRN: [id number]  DOB/AGE: 03/25/1929 88 y.o.  Primary Care Physician:Sowles, Drue Stager, MD  Admit date: 12/28/2021  Chief Complaint:  Chief Complaint  Patient presents with   Chest Pain    S-Pt presented on  12/28/2021 with  Chief Complaint  Patient presents with   Chest Pain  . Patient seen resting in bed High flow oxygen use States she feels better denies nausea and vomiting  Medications     QBH:ALPFX from the symptoms mentioned above,there are no other symptoms referable to all systems reviewed.  Physical Exam: Vital signs in last 24 hours: Temp:  [97.6 F (36.4 C)-98.1 F (36.7 C)] 97.6 F (36.4 C) (02/27 1148) Pulse Rate:  [81-112] 81 (02/27 1148) Resp:  [16-21] 20 (02/27 1148) BP: (92-153)/(56-68) 134/63 (02/27 1148) SpO2:  [84 %-100 %] 99 % (02/27 1148) FiO2 (%):  [50 %-60 %] 60 % (02/27 1148) Weight:  [43.3 kg] 43.3 kg (02/27 0500) Weight change: 3.066 kg Last BM Date : 12/30/21  Intake/Output from previous day: 02/26 0701 - 02/27 0700 In: 712.2 [P.O.:540; I.V.:172.2] Out: 740 [Urine:740] Total I/O In: -  Out: 800 [Urine:800]   Physical Exam:  General- pt is awake,alert, oriented to time place and person  Resp- No acute REsp distress, rhonchi present  CVS- S1S2 regular in rate and rhythm  GIT- BS+, soft, Non tender , Non distended  EXT-no LE Edema,  No Cyanosis    Lab Results:  CBC  Recent Labs    01/01/22 0658 01/02/22 0348  WBC 11.1* 13.3*  HGB 7.9* 7.1*  HCT 24.4* 21.7*  PLT 192 175     BMET  Recent Labs    01/01/22 0658 01/02/22 0348  NA 141 144  K 3.5 3.5  CL 103 101  CO2 23 23  GLUCOSE 132* PENDING  BUN 121* PENDING  CREATININE 4.77* 4.69*  CALCIUM 8.4* 8.6*       Most recent Creatinine trend  Lab Results  Component Value Date   CREATININE 4.69 (H) 01/02/2022   CREATININE 4.77 (H) 01/01/2022   CREATININE 4.84 (H) 12/31/2021       MICRO   Recent Results (from the past 240  hour(s))  Resp Panel by RT-PCR (Flu A&B, Covid) Nasopharyngeal Swab     Status: Abnormal   Collection Time: 12/28/21  8:25 PM   Specimen: Nasopharyngeal Swab; Nasopharyngeal(NP) swabs in vial transport medium  Result Value Ref Range Status   SARS Coronavirus 2 by RT PCR POSITIVE (A) NEGATIVE Final    Comment: (NOTE) SARS-CoV-2 target nucleic acids are DETECTED.  The SARS-CoV-2 RNA is generally detectable in upper respiratory specimens during the acute phase of infection. Positive results are indicative of the presence of the identified virus, but do not rule out bacterial infection or co-infection with other pathogens not detected by the test. Clinical correlation with patient history and other diagnostic information is necessary to determine patient infection status. The expected result is Negative.  Fact Sheet for Patients: EntrepreneurPulse.com.au  Fact Sheet for Healthcare Providers: IncredibleEmployment.be  This test is not yet approved or cleared by the [place] FDA and  has been authorized for detection and/or diagnosis of SARS-CoV-2 by FDA under an Emergency Use Authorization (EUA).  This EUA will remain in effect (meaning this test can be used) for the duration of  the COVID-19 declaration under Section 564(b)(1) of the A ct, 21 U.S.C. section 360bbb-3(b)(1), unless the authorization is terminated or revoked sooner.     Influenza A by  PCR NEGATIVE NEGATIVE Final   Influenza B by PCR NEGATIVE NEGATIVE Final    Comment: (NOTE) The Xpert Xpress SARS-CoV-2/FLU/RSV plus assay is intended as an aid in the diagnosis of influenza from Nasopharyngeal swab specimens and should not be used as a sole basis for treatment. Nasal washings and aspirates are unacceptable for Xpert Xpress SARS-CoV-2/FLU/RSV testing.  Fact Sheet for Patients: EntrepreneurPulse.com.au  Fact Sheet for Healthcare  Providers: IncredibleEmployment.be  This test is not yet approved or cleared by the [place] FDA and has been authorized for detection and/or diagnosis of SARS-CoV-2 by FDA under an Emergency Use Authorization (EUA). This EUA will remain in effect (meaning this test can be used) for the duration of the COVID-19 declaration under Section 564(b)(1) of the Act, 21 U.S.C. section 360bbb-3(b)(1), unless the authorization is terminated or revoked.  Performed at [facility], [address] [place],  [address]          Impression:   Ms. Samantha Dawson is a 88 y.o.  female with previous medical conditions including CAD, COPD, GERD, hyperlipidemia, and chronic systolic and diastolic heart failure, who was admitted to [facility] on 12/28/2021 for.Rapid atrial fibrillation (HCC) [I48.91] AKI (acute kidney injury) ([place]) [N17.9]   Acute kidney injury with a baseline function within normal range in December 2022.  Acute kidney injury likely multifactorial due to poor oral intake and severe dehydration along with recent cardiac event.  No exposure to IV contrast.  Renal ultrasound negative for obstruction.   Creatinine has shown little improvement since admission. UOP 776ml overnight..  Patient and family has opted to defer initiating dialysis and are considering proceeding with hospice services.     2.  Atrial fibrillation with chronic diastolic heart failure.  Echo completed shows EF 55 to 60% with a grade 2 diastolic dysfunction.  Cardiology following  3. Anemia of chronic disease.  CBC Latest Ref Rng & Units 01/02/2022 01/01/2022 12/31/2021  WBC 4.0 - 10.5 K/uL 13.3(H) 11.1(H) 10.6(H)  Hemoglobin 12.0 - 15.0 g/dL 7.1(L) 7.9(L) 7.9(L)  Hematocrit 36.0 - 46.0 % 21.7(L) 24.4(L) 24.8(L)  Platelets 150 - 400 K/uL 175 192 194    Hemoglobin is stable  5. 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 Feel free to contact us with questions or concerns.  [place]  01/02/2022, 1:40 PM

## 2022-01-02 NOTE — Progress Notes (Signed)
PT Cancellation Note  Patient Details Name: Samantha Dawson MRN: [id number] DOB: 05-11-1929   Cancelled Treatment:    Reason Eval/Treat Not Completed: Other (comment): Pt's PT orders completed secondary to pt transitioning to comfort care.    Linus Salmons PT, DPT 01/02/22, 1:23 PM

## 2022-01-02 NOTE — TOC Progression Note (Signed)
Transition of Care [facility]) - Progression Note    Patient Details  Name: Samantha Dawson MRN: [id number] Date of Birth: 1929-03-16  Transition of Care [facility]) CM/SW Contact  Candie Chroman, LCSW Phone Number: 01/02/2022, 12:45 PM  Clinical Narrative:   Per palliative, plan to transition to comfort care. Family prefers hospice home in [place]. Sent secure chat to Daphene Calamity to notify.  Expected Discharge Plan: Home/Self Care Barriers to Discharge: Continued Medical Work up  Expected Discharge Plan and Services Expected Discharge Plan: Home/Self Care     Post Acute Care Choice: NA Living arrangements for the past 2 months: Single Family Home                                       Social Determinants of Health (SDOH) Interventions    Readmission Risk Interventions Readmission Risk Prevention Plan 12/30/2021  Transportation Screening Complete  Medication Review (RN Care Manager) Complete  PCP or Specialist appointment within 3-5 days of discharge Complete  SW Recovery Care/Counseling Consult Complete  [place] Not Applicable  Some recent data might be hidden

## 2022-01-02 NOTE — Discharge Summary (Addendum)
Physician Discharge Summary   Patient: Samantha Dawson MRN: [id number] DOB: [birth year]  Admit date:     12/28/2021  Discharge date: 01/03/22  Discharge Physician: Lorella Nimrod   PCP: Steele Sizer, MD   Recommendations at discharge:  Patient is being discharged to hospice home for end-of-life care.  Discharge Diagnoses: Principal Problem:   NSTEMI (non-ST elevated myocardial infarction) ([place]) Active Problems:   AKI (acute kidney injury) ([place])   Chronic combined systolic and diastolic heart failure (HCC)   Paroxysmal atrial fibrillation with RVR (HCC)   Increased anion gap metabolic acidosis   Hypokalemia   Transaminitis   Essential hypertension   Acute anemia   Acid reflux   Hyperlipidemia   Chest pain  Resolved Problems:   * No resolved hospital problems. [facility] Course: Taken from H&P.  CHEYANN Dawson is a 88 y.o. female with medical history significant for coronary artery disease status post stent placement in November 2022, hyperlipidemia, GERD, chronic systolic/diastolic heart failure, COPD, who is admitted to [facility] on 12/28/2021 with acute kidney injury after presenting from home to [facility] ED complaining of chest pain.  It was sudden onset sharp, substernal chest pain without any radiation, no exertional component, nonpleuritic.  Pain was associated with some palpitations, no shortness of breath, nausea or vomiting.  She received a full dose of aspirin at home earlier and pain improved in about an hour.  On arrival to ED she was also found to have new onset A-fib with RVR, spontaneous conversion to sinus rhythm while she was in ED.  Labs pertinent for rising troponin, which peaked at 16, 606, AKI with creatinine of 5.5, baseline around 1 in December 2022, lipase elevated at 374, potassium of 2.6, magnesium 1.6 and mild transaminitis.  COVID-19 was positive with CT value of 43.0 which consistent with prior infection.  Apparently she was positive at the end of December  2022.  No need for any treatment or isolation.  Prior echocardiogram with EF of 35 to 40% and global hypokinesis, grade 3 diastolic dysfunction,moderately dilated bilateral atria, trivial mitral regurgitation, trivial aortic regurgitation.  Cardiology was consulted and she was started on heparin infusion by cardiology.  Per daughter patient is experiencing declining in her health since her COVID-19 infection, tested positive on 10/25/2021.  Recently PCP advised to follow-up with palliative care.  Nephrology was consulted due to persistent AKI with GFR of 7, normal in December.  Renal ultrasound without any obstruction, did show renal cyst.  No bladder outlet obstruction, remained oliguric.  Palliative care was also consulted, patient is not ready for hospice discussion.  They might consider short-term dialysis.  No final decision yet. PT is recommending SNF.  2/25: Patient with worsening BUN, slight improvement in creatinine.  Worsening acidosis.  Had another long discussion between nephrology and patient, patient decided to consider hospice and does not want dialysis.  Cardiology discontinued heparin.  IV fluid switched with bicarb and she also received one-time dose of Lasix.  Repeat chest x-ray with worsening pulmonary vascular congestion.  2/26: Renal function with some improvement today.  Having some cough.  2/27: Patient with worsening back pain and hypoxia.  She was placed on heated high flow at 45 L of oxygen, 60% FiO2 overnight.  She seems very dusky, pale and in distress when seen this morning.  Concern of an another heart attack, family will avoid more lab work and decided to proceed with comfort measures only.  We will avoid more sticks and comfort  measures started. She is being evaluated for home hospice-awaiting final recommendations.  Patient with multiorgan dysfunction.  Very high risk for deterioration and death.  Hospice home decided to take her tonight.  Currently appears  comfortable with IV Dilaudid. She is being discharged to hospice home for further end-of-life care.  Assessment and Plan: * NSTEMI (non-ST elevated myocardial infarction) ([place])- (present on admission) Concern of another heart attack with her symptoms of worsening back pain, becoming more hypoxic and appears in distress.  Family and patient does not want another stick to repeat troponin.  She wants to keep her just comfortable.  Patient admitted with chest pain, upward trending troponin which peaked at [address]606 and now started trending down.  Chest pain has been resolved.  Numbers consistent with NSTEMI.  Recent history of NSTEMI s/p PCI in November 2022, compliant with aspirin and Brilinta.   Cardiology has been consulted. Her cardiologist Dr. Manuella Ghazi is out of [place]-Dr. Clayborn Bigness will be covering. Repeat echocardiogram with low normal EF, grade 2 diastolic dysfunction. Received 48 hours of heparin infusion -Currently on Eliquis -Brilinta was switched with Plavix by cardiology to decrease the risk of bleeding -Will not be a candidate for cardiac catheter at this time due to AKI -No further cardiac work-up planned. -Family and patient decided to pursue comfort measures only.  AKI (acute kidney injury) ([place])- (present on admission) Creatinine at 5.55>>5.14 t>>4.84>>4.77>>4.69 ,  baseline around 1 in December 2022.  No contrast exposure. Renal ultrasound with no hydronephrosis or nephrolithiasis.  Bilateral stable cyst.  No bladder outlet obstruction on bladder scan.  UOP seems improving. Metabolic acidosis has been resolved. -Nephrology was consulted- -Concern of cardiorenal due to NSTEMI. -Poor p.o. intake can be contributory -Encourage p.o. hydration -Patient is now comfort measures only  Chronic combined systolic and diastolic heart failure ([place])- (present on admission) EF of 35 to 40% with global hypokinesis according to prior echo.  EF improved to 50 to 55% and grade 2 diastolic  dysfunction on repeat echo done on 12/29/2021. clinically appears euvolemic.  Repeat chest x-ray with worsening pulmonary vascular congestion.  Received 1 dose of IV Lasix -Monitor volume status closely -Daily BMP and weight -Strict intake and output  Increased anion gap metabolic acidosis Improved.  Multifactorial, most likely secondary to hypoxia with NSTEMI and worsening renal function.  Lactic acid within normal limit -Discontinue bicarb infusion -Continue to monitor  Paroxysmal atrial fibrillation with RVR ([place])- (present on admission) New onset,CHA2DS2-VASc score of 6. Spontaneous conversion to sinus rhythm. Cardiology started her on amiodarone. -Continue with Eliquis -Continue to monitor -Continue with metoprolol  Hypokalemia- (present on admission) Resolved. -Monitor electrolyte and replete as needed  Transaminitis- (present on admission) Can be due to cardiohepatic reason. -Hepatitis viral panel-pending -Acetaminophen level-pending  Essential hypertension- (present on admission) Blood pressure soft this morning on Lasix and metoprolol at home. -Hold metoprolol -Continue to monitor  Acute anemia- (present on admission) Hemoglobin with slowly declined, at 7.1 this morning baseline around 12 in December.  Patient was on aspirin and Brilinta at home.  Poor p.o. intake. Anemia panel with abnormally normal reticulocyte, more consistent with anemia of chronic disease.  No obvious bleeding. -Patient was offered a blood transfusion which she and family declined as they decided to proceed with comfort measures only. -No more labs  Acid reflux- (present on admission) - Continue with Protonix  Hyperlipidemia- (present on admission) - Continue with Crestor      Consultants: Cardiology, nephrology and palliative care Procedures performed: None Disposition: Hospice  care Diet recommendation:  Discharge Diet Orders (From admission, onward)     Start     Ordered    01/02/22 0000  Diet - low sodium heart healthy        01/02/22 1559           Cardiac diet  DISCHARGE MEDICATION: Allergies as of 01/03/2022       Reactions   Alendronate Nausea Only        Medication List     STOP taking these medications    aspirin 81 MG tablet   HYDROcodone-acetaminophen 5-325 MG tablet Commonly known as: NORCO/VICODIN   potassium chloride SA 20 MEQ tablet Commonly known as: KLOR-CON M   rosuvastatin 20 MG tablet Commonly known as: CRESTOR   ticagrelor 90 MG Tabs tablet Commonly known as: BRILINTA       TAKE these medications    albuterol 108 (90 Base) MCG/ACT inhaler Commonly known as: ProAir HFA INHALE 2 PUFFS INTO THE LUNGS EVERY 4 HOURS AS NEEDED FOR WHEEZING OR SHORTNESS OF BREATH; FUTURE REFILLS FROM LUNG DOCTOR   furosemide 20 MG tablet Commonly known as: LASIX Take 1 tablet (20 mg total) by mouth daily. Take when you gain 2 lbs in 24 hours   isosorbide mononitrate 30 MG 24 hr tablet Commonly known as: IMDUR Take 30 mg by mouth daily.   metoprolol succinate 50 MG 24 hr tablet Commonly known as: TOPROL-XL Take 50 mg by mouth daily.   nitroGLYCERIN 0.4 MG SL tablet Commonly known as: NITROSTAT Place 0.4 mg under the tongue every 5 (five) minutes as needed for chest pain.   pantoprazole 20 MG tablet Commonly known as: PROTONIX Take 1 tablet (20 mg total) by mouth daily.   umeclidinium-vilanterol 62.5-25 MCG/ACT Aepb Commonly known as: ANORO ELLIPTA Inhale 1 puff into the lungs daily at 6 (six) AM.         Discharge Exam: Filed Weights   12/29/21 1712 01/01/22 0449 01/02/22 0500  Weight: 34.5 kg 40.2 kg 43.3 kg   General.  In no acute distress, resting comfortably with unlabored breathing now. Pulmonary.  Lungs clear bilaterally, normal respiratory effort. CV.  Regular rate and rhythm, no JVD, rub or murmur. Abdomen.  Soft, nontender, nondistended, BS positive. CNS.  Somnolent, no apparent focal  deficit Extremities.  No edema, no cyanosis, pulses intact and symmetrical.  Condition at discharge: worsening  The results of significant diagnostics from this hospitalization (including imaging, microbiology, ancillary and laboratory) are listed below for reference.   Imaging Studies: DG Chest 1 View  Result Date: 12/31/2021 CLINICAL DATA:  Shortness of breath EXAM: CHEST  1 VIEW COMPARISON:  Previous studies including the examination of 12/28/2021 FINDINGS: Transverse diameter of heart is increased. There is interval worsening of extensive interstitial and alveolar densities in both lungs. There is blunting of both lateral CP angles. Dense calcification is seen in the mitral annulus. There is no pneumothorax. IMPRESSION: There is interval worsening of interstitial densities in both lungs suggesting worsening pulmonary edema. Possibility underlying interstitial pneumonia is not excluded. Small bilateral pleural effusions. Electronically Signed   By: Elmer Picker M.D.   On: 12/31/2021 11:56   US RENAL  Result Date: 12/29/2021 CLINICAL DATA:  Acute kidney injury EXAM: RENAL / URINARY TRACT ULTRASOUND COMPLETE COMPARISON:  Sonogram dated September 11, 2021 FINDINGS: Right Kidney: Renal measurements: 9.8 x 5.4 x 4.9 = volume: 135 mL. Echogenicity within normal limits. No mass or hydronephrosis visualized. Simple cyst on the lateral aspect of the  kidney measuring 1.4 x 1.3 x 1.5 cm and another upper pole cyst measuring 1.1 x 0.8 x 1.0 cm. Left Kidney: Renal measurements: 8.8 x 4.1 x 4.7 = volume: 89.8 mL. Echogenicity within normal limits. No mass or hydronephrosis visualized. Anechoic avascular cyst in the lower pole measuring 1.1 x 1.4 x 1.4 cm. Another anechoic avascular cyst measuring 2.3 x 1.9 x 2.3 cm in the interpolar region. Bladder: Appears normal for degree of bladder distention. Other: None. IMPRESSION: 1.  No evidence of nephrolithiasis or hydronephrosis. 2.  Bilateral simple renal cysts,  unchanged. Electronically Signed   By: Keane Police D.O.   On: 12/29/2021 10:08   DG Chest Port 1 View  Result Date: 12/28/2021 CLINICAL DATA:  Chest pain. EXAM: PORTABLE CHEST 1 VIEW COMPARISON:  Chest x-ray 10/24/2021 FINDINGS: The cardiac silhouette, mediastinal and hilar contours are within normal limits and stable. Chronic emphysematous changes and pulmonary scarring but no definite acute overlying pulmonary process. The bony thorax is intact. IMPRESSION: Chronic lung changes but no acute pulmonary findings. Electronically Signed   By: Marijo Sanes M.D.   On: 12/28/2021 18:53   ECHOCARDIOGRAM COMPLETE  Result Date: 12/29/2021    ECHOCARDIOGRAM REPORT   Patient Name:   PETRITA BLUNCK [facility] Date of Exam: 12/29/2021 Medical Rec #:  [id number]       Height:       60.0 in Accession #:    [id number]      Weight:       72.0 lb Date of Birth:  [birth year]       BSA:          1.209 m Patient Age:    88 years        BP:           137/63 mmHg Patient Gender: F               HR:           71 bpm. Exam Location:  [facility] Procedure: 2D Echo, Cardiac Doppler and Color Doppler Indications:     I48.91 Atrial Fibrillation  History:         Patient has prior history of Echocardiogram examinations, most                  recent 08/19/2021. Risk Factors:Hypertension and Dyslipidemia.  Sonographer:     Cresenciano Lick RDCS Referring Phys:  [id number] Rhetta Mura Diagnosing Phys: Yolonda Kida MD IMPRESSIONS  1. Left ventricular ejection fraction, by estimation, is 55 to 60%. The left ventricle has normal function. The left ventricle has no regional wall motion abnormalities. Left ventricular diastolic parameters are consistent with Grade II diastolic dysfunction (pseudonormalization).  2. Right ventricular systolic function is normal. The right ventricular size is normal.  3. Left atrial size was mildly dilated.  4. The mitral valve is abnormal. Mild mitral valve regurgitation. No evidence of mitral stenosis. Moderate  mitral annular calcification.  5. The aortic valve is calcified. There is moderate calcification of the aortic valve. There is moderate thickening of the aortic valve. Aortic valve regurgitation is trivial. Aortic valve sclerosis/calcification is present, without any evidence of aortic stenosis. FINDINGS  Left Ventricle: Left ventricular ejection fraction, by estimation, is 55 to 60%. The left ventricle has normal function. The left ventricle has no regional wall motion abnormalities. The left ventricular internal cavity size was normal in size. There is  no left ventricular hypertrophy. Left ventricular diastolic parameters are consistent with Grade II diastolic  dysfunction (pseudonormalization). Right Ventricle: The right ventricular size is normal. No increase in right ventricular wall thickness. Right ventricular systolic function is normal. Left Atrium: Left atrial size was mildly dilated. Right Atrium: Right atrial size was normal in size. Pericardium: There is no evidence of pericardial effusion. Mitral Valve: The mitral valve is abnormal. There is moderate thickening of the mitral valve leaflet(s). There is moderate calcification of the mitral valve leaflet(s). Normal mobility of the mitral valve leaflets. Moderate mitral annular calcification. Mild mitral valve regurgitation. No evidence of mitral valve stenosis. Tricuspid Valve: The tricuspid valve is normal in structure. Tricuspid valve regurgitation is mild. Aortic Valve: The aortic valve is calcified. There is moderate calcification of the aortic valve. There is moderate thickening of the aortic valve. There is moderate aortic valve annular calcification. Aortic valve regurgitation is trivial. Aortic valve sclerosis/calcification is present, without any evidence of aortic stenosis. Aortic valve mean gradient measures 8.7 mmHg. Aortic valve peak gradient measures 12.9 mmHg. Aortic valve area, by VTI measures 0.91 cm. Pulmonic Valve: The pulmonic valve  was normal in structure. Pulmonic valve regurgitation is not visualized. Aorta: The ascending aorta was not well visualized. IAS/Shunts: No atrial level shunt detected by color flow Doppler.  LEFT VENTRICLE PLAX 2D LVIDd:         3.80 cm   Diastology LVIDs:         2.70 cm   LV e' medial:    3.42 cm/s LV PW:         1.20 cm   LV E/e' medial:  42.7 LV IVS:        1.40 cm   LV e' lateral:   6.57 cm/s LVOT diam:     1.80 cm   LV E/e' lateral: 22.2 LV SV:         40 LV SV Index:   33 LVOT Area:     2.54 cm  RIGHT VENTRICLE             IVC RV Basal diam:  3.50 cm     IVC diam: 1.50 cm RV S prime:     11.11 cm/s TAPSE (M-mode): 1.8 cm LEFT ATRIUM             Index        RIGHT ATRIUM           Index LA diam:        4.90 cm 4.05 cm/m   RA Area:     11.00 cm LA Vol (A2C):   68.6 ml 56.73 ml/m  RA Volume:   25.90 ml  21.42 ml/m LA Vol (A4C):   57.5 ml 47.55 ml/m LA Biplane Vol: 63.1 ml 52.19 ml/m  AORTIC VALVE AV Area (Vmax):    0.91 cm AV Area (Vmean):   0.82 cm AV Area (VTI):     0.91 cm AV Vmax:           179.67 cm/s AV Vmean:          141.667 cm/s AV VTI:            0.435 m AV Peak Grad:      12.9 mmHg AV Mean Grad:      8.7 mmHg LVOT Vmax:         64.35 cm/s LVOT Vmean:        45.750 cm/s LVOT VTI:          0.156 m LVOT/AV VTI ratio: 0.36  AORTA Ao Root diam: 2.90 cm  MITRAL VALVE                TRICUSPID VALVE MV Area (PHT): 2.50 cm     TR Peak grad:   49.3 mmHg MV Decel Time: 303 msec     TR Vmax:        351.00 cm/s MV E velocity: 146.00 cm/s MV A velocity: 74.70 cm/s   SHUNTS MV E/A ratio:  1.95         Systemic VTI:  0.16 m                             Systemic Diam: 1.80 cm Yolonda Kida MD Electronically signed by Yolonda Kida MD Signature Date/Time: 12/29/2021/4:57:14 PM    Final     Microbiology: Results for orders placed or performed during the hospital encounter of 12/28/21  Resp Panel by RT-PCR (Flu A&B, Covid) Nasopharyngeal Swab     Status: Abnormal   Collection Time: 12/28/21  8:25 PM    Specimen: Nasopharyngeal Swab; Nasopharyngeal(NP) swabs in vial transport medium  Result Value Ref Range Status   SARS Coronavirus 2 by RT PCR POSITIVE (A) NEGATIVE Final    Comment: (NOTE) SARS-CoV-2 target nucleic acids are DETECTED.  The SARS-CoV-2 RNA is generally detectable in upper respiratory specimens during the acute phase of infection. Positive results are indicative of the presence of the identified virus, but do not rule out bacterial infection or co-infection with other pathogens not detected by the test. Clinical correlation with patient history and other diagnostic information is necessary to determine patient infection status. The expected result is Negative.  Fact Sheet for Patients: EntrepreneurPulse.com.au  Fact Sheet for Healthcare Providers: IncredibleEmployment.be  This test is not yet approved or cleared by the [place] FDA and  has been authorized for detection and/or diagnosis of SARS-CoV-2 by FDA under an Emergency Use Authorization (EUA).  This EUA will remain in effect (meaning this test can be used) for the duration of  the COVID-19 declaration under Section 564(b)(1) of the A ct, 21 U.S.C. section 360bbb-3(b)(1), unless the authorization is terminated or revoked sooner.     Influenza A by PCR NEGATIVE NEGATIVE Final   Influenza B by PCR NEGATIVE NEGATIVE Final    Comment: (NOTE) The Xpert Xpress SARS-CoV-2/FLU/RSV plus assay is intended as an aid in the diagnosis of influenza from Nasopharyngeal swab specimens and should not be used as a sole basis for treatment. Nasal washings and aspirates are unacceptable for Xpert Xpress SARS-CoV-2/FLU/RSV testing.  Fact Sheet for Patients: EntrepreneurPulse.com.au  Fact Sheet for Healthcare Providers: IncredibleEmployment.be  This test is not yet approved or cleared by the [place] FDA and has been authorized for detection  and/or diagnosis of SARS-CoV-2 by FDA under an Emergency Use Authorization (EUA). This EUA will remain in effect (meaning this test can be used) for the duration of the COVID-19 declaration under Section 564(b)(1) of the Act, 21 U.S.C. section 360bbb-3(b)(1), unless the authorization is terminated or revoked.  Performed at [facility], [address] [place], [place] [address]     Labs: CBC: Recent Labs  Lab 12/29/21 [phone] 12/30/21 4098 12/31/21 0341 01/01/22 0658 01/02/22 0348  WBC 10.2 9.7 10.6* 11.1* 13.3*  NEUTROABS 8.0*  --   --   --   --   HGB 8.8* 8.4* 7.9* 7.9* 7.1*  HCT 27.6* 26.2* 24.8* 24.4* 21.7*  MCV 98.6 96.7 98.0 96.8 97.7  PLT 180 207  194 192 379   Basic Metabolic Panel: Recent Labs  Lab 12/28/21 2025 12/29/21 0555 12/29/21 1706 12/30/21 0638 12/31/21 0341 01/01/22 0658 01/02/22 0348  NA  --  142 139 142 139 141 144  K  --  2.6* 3.7 4.3 3.9 3.5 3.5  CL  --  103 103 108 108 103 101  CO2  --  19* 18* 17* 15* 23 23  GLUCOSE  --  95 124* 107* 111* 132* 142*  BUN  --  151* 97* 148* 187* 121* 105*  CREATININE  --  5.55* 5.14* 5.14* 4.84* 4.77* 4.69*  CALCIUM  --  8.1* 8.0* 8.3* 8.1* 8.4* 8.6*  MG 1.4* 1.6*  --  2.1  --   --   --   PHOS  --   --   --  8.7* 8.8*  --   --    Liver Function Tests: Recent Labs  Lab 12/28/21 1840 12/29/21 0555 12/30/21 0638 12/31/21 0341  AST 187* 198* 161*  --   ALT 130* 126* 127*  --   ALKPHOS 57 53 53  --   BILITOT 1.2 0.6 0.5  --   PROT 7.3 6.7 6.7  --   ALBUMIN 2.7* 2.6* 2.5* 2.4*   CBG: No results for input(s): GLUCAP in the last 168 hours.  Discharge time spent: greater than 30 minutes.  Signed: Lorella Nimrod, MD [facility] 01/03/2022

## 2022-01-02 NOTE — Progress Notes (Addendum)
Progress note:  After reviewing the patient's chart and assessing the patient at bedside, I met with patient and her 2 daughters and granddaughter at bedside to discuss end-of-life wishes, prognosis, and disposition.  Over the weekend patient shared she did not want to move forward with hemodialysis.  Discussed that patient has a lower blood count and had a significant event of some sort overnight that may require further work-up.  Discussed moving forward with giving blood products and performing lab work versus focusing solely on comfort.  Patient stated she would like to just have her pain medicine.  Daughters and granddaughters at bedside were in agreement to avoid any escalation of care, blood products, further lab draws, and to focus strictly on comfort.  Comfort care plan discussed in detail.  Unrestricted visitor access, adjustments to medications in [facility] to be focused only on comfort, and discussions with nursing had in regards to patient care.  Hospice in the home and hospice inpatient unit discussed with patient and family.  Family is unable to care for patient at home and patient in agreement to be evaluated for hospice inpatient placement.  TOC and Dr. Reesa Chew made aware of patient's wishes.  Hospice liaison to evaluate patient for inpatient unit.  Plan: Wean patient off of oxygen so she may be able to be transferred if bed available and she is eligible at hospice home  Full comfort measures  Unrestricted visitor access  As needed medications focused on relief from pain, anxiety, agitation, and breathlessness  PE: ill appearing, dusky color skin, HFNC in place, patient akncoledges my presence and able to converse with me in short sentences before becoming short of breath, denise chest pain, warm to touch, no edema noted, mood and behavior normal   Afternoon rounds:  I returned to the patient's bedside in the afternoon to check on her symptoms.  Patient appears to be resting much  more calmly with respirations even and unlabored.  Family shares that the IV medication clearly kept her out of pain and has made her more comfortable.  They have already spoken with hospice liaison and are awaiting eligibility or approval.  Questions and concerns were addressed.  Family continues to be concerned that family members under the age of 12 may not be able to visit.  As per [facility] visitation policy, patients who are at end-of-life are allowed to have unrestricted visitors - with no age restriction as long as those 12 years and under are accompanied by an adult.  Family is understanding of visitor policy and comfort care plan.  Questions and concerns were addressed.  [place] Ilsa Iha, FNP-BC Palliative Medicine Team Team Phone # [phone]  Time: 80 minutes

## 2022-01-03 DIAGNOSIS — I214 Non-ST elevation (NSTEMI) myocardial infarction: Secondary | ICD-10-CM | POA: Diagnosis not present

## 2022-01-03 NOTE — Progress Notes (Signed)
[facility] [address])   Consent forms have been completed.  EMS notified of patient D/C and transport arrange for 10:30a.  TOC and Attending Physician also notified of transport arrangement.    Please send signed DNR form with patient and RN call report to [phone].    Daphene Calamity, MSW [facility] Liaison [phone]

## 2022-01-03 NOTE — TOC Transition Note (Signed)
Transition of Care [facility]) - CM/SW Discharge Note   Patient Details  Name: [name] MRN: [id number] Date of Birth: 08/31/1929  [facility]) CM/SW Contact:  Candie Chroman, LCSW Phone Number: 01/03/2022, 9:36 AM   Clinical Narrative:  Patient has orders to discharge to [facility] today. RN will call report to [phone]. [facility] liaison set up EMS transport for 10:30 am. MD is aware DNR needs to be signed. No further concerns. CSW signing off.   Final next level of care: [place] Barriers to Discharge: Barriers Resolved   Patient Goals and CMS Choice     Choice offered to / list presented to : Adult Children  Discharge Placement              Patient chooses bed at: Other - please specify in the comment section below: [facility]) Patient to be transferred to facility by: EMS   Patient and family notified of of transfer: 01/03/22  Discharge Plan and Services     Post Acute Care Choice: NA                               Social Determinants of Health (SDOH) Interventions     Readmission Risk Interventions Readmission Risk Prevention Plan 12/30/2021  Transportation Screening Complete  Medication Review ([place]) Complete  PCP or Specialist appointment within 3-5 days of discharge Complete  SW Recovery Care/Counseling Consult Complete  [place] Not Applicable  Some recent data might be hidden

## 2022-01-10 ENCOUNTER — Telehealth: Payer: Medicare Other | Admitting: Nurse Practitioner

## 2022-01-12 LAB — METHYLMALONIC ACID, SERUM: Methylmalonic Acid, Quantitative: 547 nmol/L — ABNORMAL HIGH (ref 0–378)

## 2022-01-30 ENCOUNTER — Other Ambulatory Visit: Payer: Medicare Other

## 2022-01-31 ENCOUNTER — Ambulatory Visit: Payer: Medicare Other | Admitting: Family Medicine

## 2022-02-02 ENCOUNTER — Ambulatory Visit: Payer: Medicare Other | Admitting: Oncology

## 2022-02-02 ENCOUNTER — Ambulatory Visit: Payer: Medicare Other | Admitting: Radiation Oncology

## 2022-02-04 DEATH — deceased

## 2022-02-14 ENCOUNTER — Ambulatory Visit: Payer: Medicare Other

## 2022-02-27 ENCOUNTER — Ambulatory Visit: Payer: Medicare Other | Admitting: Family Medicine

## 2022-03-31 ENCOUNTER — Ambulatory Visit: Payer: Medicare Other

## 2022-04-05 ENCOUNTER — Ambulatory Visit: Payer: Medicare Other | Admitting: Oncology

## 2022-05-02 IMAGING — DX DG CHEST 1V
1 series · 1 of 1 positions shown · non-contrast
Comparison: Previous studies including the examination of
12/28/2021

CLINICAL DATA: Shortness of breath

EXAM:
CHEST  1 VIEW

[chest ap]
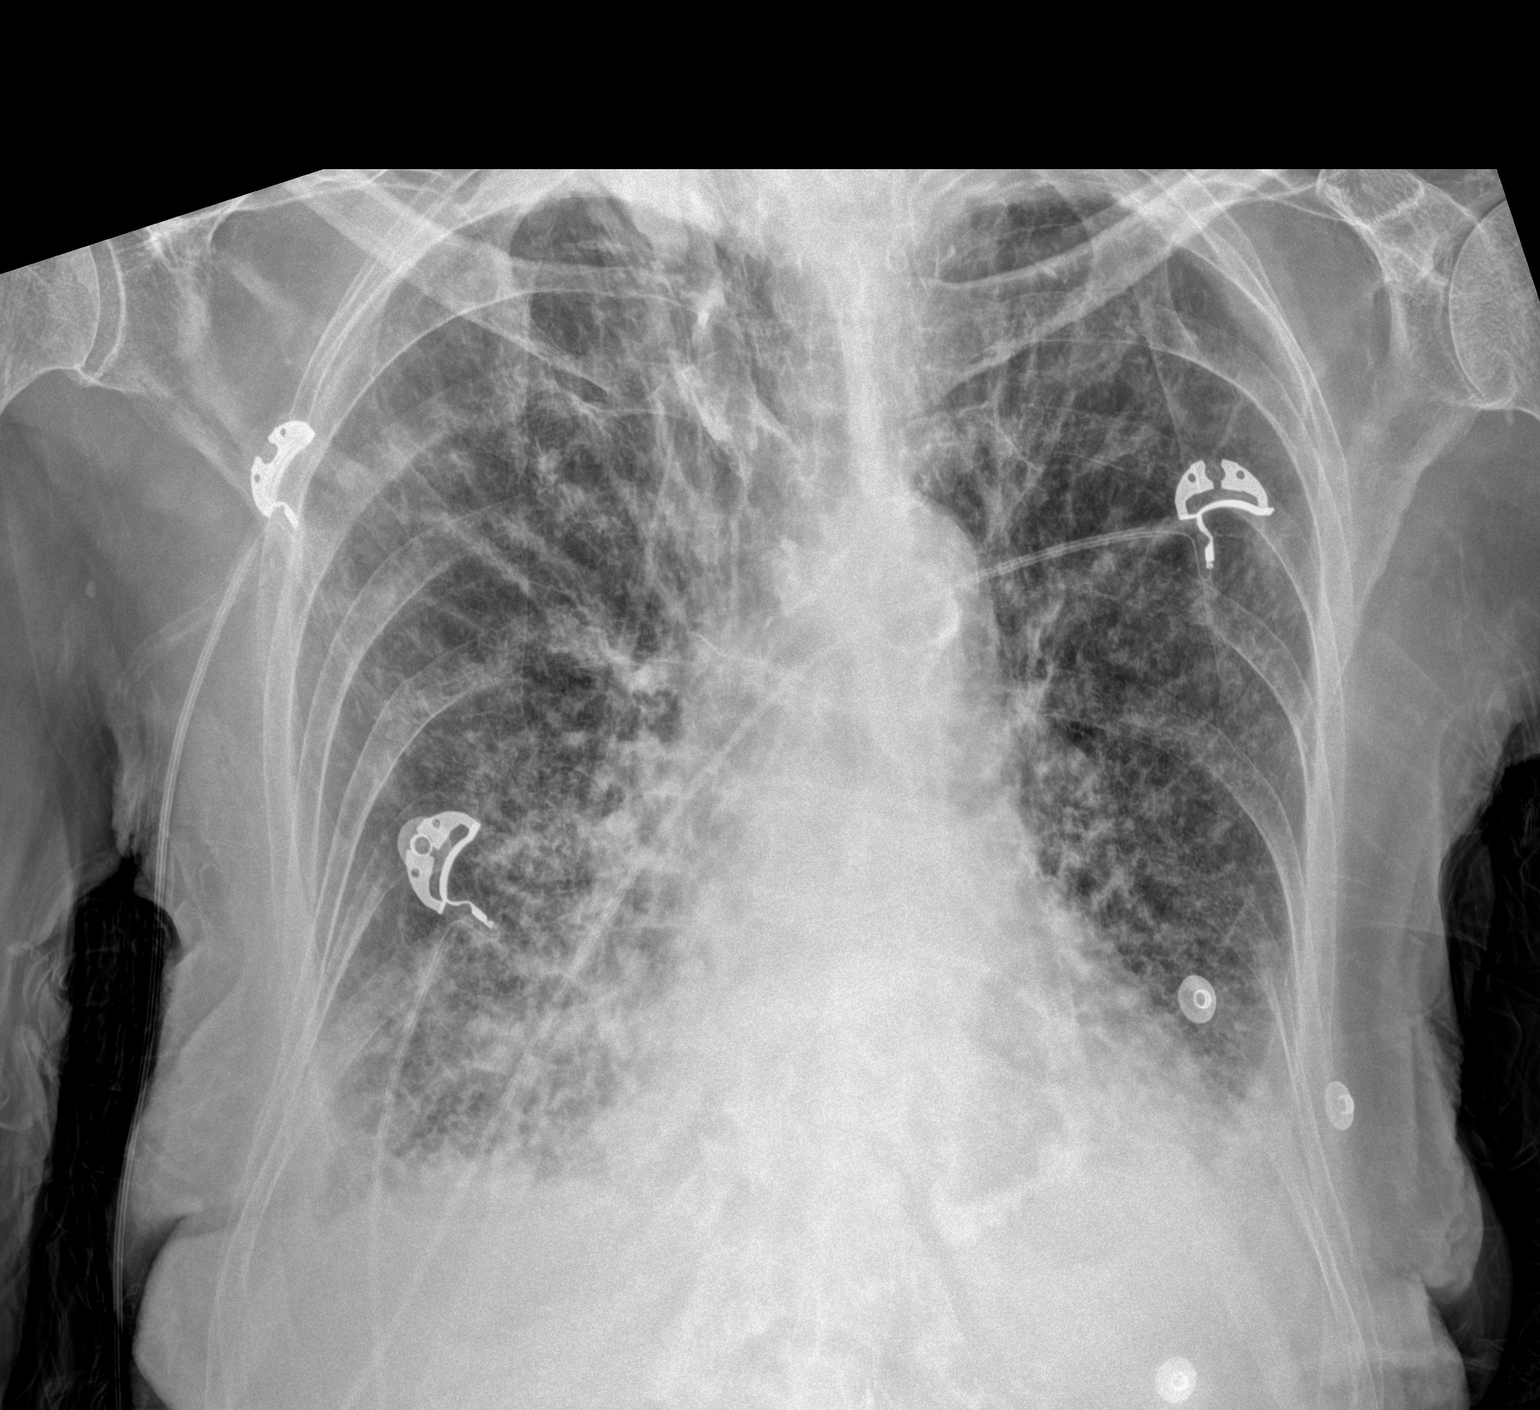

[1 of 1 positions shown; findings below may reference images not displayed]

FINDINGS: Transverse diameter of heart is increased. There is interval
worsening of extensive interstitial and alveolar densities in both
lungs. There is blunting of both lateral CP angles. Dense
calcification is seen in the mitral annulus. There is no
pneumothorax.
IMPRESSION: There is interval worsening of interstitial densities in both lungs
suggesting worsening pulmonary edema. Possibility underlying
interstitial pneumonia is not excluded. Small bilateral pleural
effusions.
